# Patient Record
Sex: Female | Born: 1960 | Race: White | Hispanic: No | Marital: Married | Smoking: Former smoker
Health system: Southern US, Community
[De-identification: ages and names within clinical notes are randomized; demographics above are authoritative.]

## PROBLEM LIST (undated history)

## (undated) DIAGNOSIS — D751 Secondary polycythemia: Secondary | ICD-10-CM

## (undated) DIAGNOSIS — I10 Essential (primary) hypertension: Secondary | ICD-10-CM

## (undated) DIAGNOSIS — E669 Obesity, unspecified: Secondary | ICD-10-CM

## (undated) DIAGNOSIS — M169 Osteoarthritis of hip, unspecified: Secondary | ICD-10-CM

## (undated) DIAGNOSIS — E079 Disorder of thyroid, unspecified: Secondary | ICD-10-CM

## (undated) DIAGNOSIS — B019 Varicella without complication: Secondary | ICD-10-CM

## (undated) DIAGNOSIS — E785 Hyperlipidemia, unspecified: Secondary | ICD-10-CM

## (undated) DIAGNOSIS — F419 Anxiety disorder, unspecified: Secondary | ICD-10-CM

## (undated) DIAGNOSIS — T8859XA Other complications of anesthesia, initial encounter: Secondary | ICD-10-CM

## (undated) DIAGNOSIS — F32A Depression, unspecified: Secondary | ICD-10-CM

## (undated) DIAGNOSIS — Z87442 Personal history of urinary calculi: Secondary | ICD-10-CM

## (undated) DIAGNOSIS — E039 Hypothyroidism, unspecified: Secondary | ICD-10-CM

## (undated) DIAGNOSIS — S0185XA Open bite of other part of head, initial encounter: Secondary | ICD-10-CM

## (undated) DIAGNOSIS — W540XXA Bitten by dog, initial encounter: Secondary | ICD-10-CM

## (undated) DIAGNOSIS — E119 Type 2 diabetes mellitus without complications: Secondary | ICD-10-CM

## (undated) DIAGNOSIS — R748 Abnormal levels of other serum enzymes: Secondary | ICD-10-CM

## (undated) DIAGNOSIS — T4145XA Adverse effect of unspecified anesthetic, initial encounter: Secondary | ICD-10-CM

## (undated) DIAGNOSIS — K219 Gastro-esophageal reflux disease without esophagitis: Secondary | ICD-10-CM

## (undated) DIAGNOSIS — F209 Schizophrenia, unspecified: Secondary | ICD-10-CM

## (undated) DIAGNOSIS — K760 Fatty (change of) liver, not elsewhere classified: Secondary | ICD-10-CM

## (undated) DIAGNOSIS — G4730 Sleep apnea, unspecified: Secondary | ICD-10-CM

## (undated) HISTORY — DX: Abnormal levels of other serum enzymes: R74.8

## (undated) HISTORY — DX: Depression, unspecified: F32.A

## (undated) HISTORY — DX: Fatty (change of) liver, not elsewhere classified: K76.0

## (undated) HISTORY — DX: Sleep apnea, unspecified: G47.30

## (undated) HISTORY — DX: Schizophrenia, unspecified: F20.9

## (undated) HISTORY — DX: Disorder of thyroid, unspecified: E07.9

## (undated) HISTORY — DX: Varicella without complication: B01.9

## (undated) HISTORY — DX: Osteoarthritis of hip, unspecified: M16.9

## (undated) HISTORY — DX: Hyperlipidemia, unspecified: E78.5

## (undated) HISTORY — DX: Essential (primary) hypertension: I10

## (undated) HISTORY — DX: Hypothyroidism, unspecified: E03.9

## (undated) HISTORY — DX: Gastro-esophageal reflux disease without esophagitis: K21.9

## (undated) HISTORY — DX: Type 2 diabetes mellitus without complications: E11.9

## (undated) HISTORY — PX: BREAST BIOPSY: SHX20

## (undated) HISTORY — DX: Anxiety disorder, unspecified: F41.9

## (undated) HISTORY — DX: Obesity, unspecified: E66.9

---

## 2011-12-14 DIAGNOSIS — E039 Hypothyroidism, unspecified: Secondary | ICD-10-CM | POA: Insufficient documentation

## 2015-03-11 DIAGNOSIS — I10 Essential (primary) hypertension: Secondary | ICD-10-CM | POA: Insufficient documentation

## 2015-03-12 DIAGNOSIS — R748 Abnormal levels of other serum enzymes: Secondary | ICD-10-CM | POA: Insufficient documentation

## 2017-11-06 ENCOUNTER — Encounter: Payer: Self-pay | Admitting: *Deleted

## 2017-11-08 ENCOUNTER — Encounter: Payer: Self-pay | Admitting: Nurse Practitioner

## 2017-11-08 ENCOUNTER — Ambulatory Visit: Payer: Medicare PPO | Admitting: Nurse Practitioner

## 2017-11-08 VITALS — BP 136/82 | HR 84 | Temp 98.0°F | Ht 63.0 in | Wt 213.0 lb

## 2017-11-08 DIAGNOSIS — Z136 Encounter for screening for cardiovascular disorders: Secondary | ICD-10-CM | POA: Diagnosis not present

## 2017-11-08 DIAGNOSIS — F39 Unspecified mood [affective] disorder: Secondary | ICD-10-CM

## 2017-11-08 DIAGNOSIS — E669 Obesity, unspecified: Secondary | ICD-10-CM

## 2017-11-08 DIAGNOSIS — Z6837 Body mass index (BMI) 37.0-37.9, adult: Secondary | ICD-10-CM | POA: Diagnosis not present

## 2017-11-08 DIAGNOSIS — Z13220 Encounter for screening for lipoid disorders: Secondary | ICD-10-CM | POA: Diagnosis not present

## 2017-11-08 DIAGNOSIS — B354 Tinea corporis: Secondary | ICD-10-CM

## 2017-11-08 DIAGNOSIS — E782 Mixed hyperlipidemia: Secondary | ICD-10-CM | POA: Diagnosis not present

## 2017-11-08 DIAGNOSIS — E119 Type 2 diabetes mellitus without complications: Secondary | ICD-10-CM | POA: Diagnosis not present

## 2017-11-08 LAB — MICROALBUMIN / CREATININE URINE RATIO
Creatinine, Urine: 46 mg/dL (ref 20–275)
Microalb Creat Ratio: 13 mcg/mg creat (ref <30)
Microalb, Ur: 0.6 mg/dL

## 2017-11-08 NOTE — Patient Instructions (Addendum)
Please sign medical release to get records from previous pcp.  You will be contacted to schedule appt with psychiatry.  Labs indicates uncontrolled with hgb A1c of 14.7. Lipid panel also indicates very elevated triglyceride and LDL. Normal TSH and CMP. Increased metformin-glipizide to 2tabs twice a day (with food). Added atorvastatin and omega fatty acid. Also sent ketoconazole cream for rash. Need to f/up in 5weeks as discussed for CPE (fasting)

## 2017-11-08 NOTE — Progress Notes (Signed)
Subjective:  Patient ID: [name] [name], female    DOB: Apr 07, 1961  Age: 56 y.o. MRN: [id number]  CC: Establish Care (est care---DM medication refills. )   Rash  This is a new problem. The current episode started more than 1 month ago. The problem has been waxing and waning since onset. The affected locations include the chest. The rash is characterized by dryness, itchiness and redness. Associated with: her dog and cats. Pertinent negatives include no anorexia, congestion, cough, diarrhea, eye pain, facial edema, fatigue, fever, joint pain or nail changes. Past treatments include moisturizer (essential oils). The treatment provided mild relief.   Mood disorder: Hx of bipolar, schiphrenia, and PTSD Current psychiatrist: Dr. Gaye [name] in [place]. Last seen 08/2017, next appt 01/2017, does not need any medication refill at this time. She will like referral to local psychiatrist before she runs out. She endorses occasional suicidal ideation and auditory hallucination " this is not new. I have no intention to act on my thoughts. I am aware enough to tell my husband if anything changes. The voices just keep me company. I don't think I want them completely gone. With my current dose, they are manageable. I know to reach out if they change"  Denies any hx of suicide attempt. No ETOH or illicit substance use. Depression screen PHQ 2/9 11/08/2017  Decreased Interest 2  Down, Depressed, Hopeless 2  PHQ - 2 Score 4  Altered sleeping 3  Tired, decreased energy 3  Change in appetite 2  Feeling bad or failure about yourself  0  Trouble concentrating 3  Moving slowly or fidgety/restless 3  Suicidal thoughts 1  PHQ-9 Score 19   Last pcp: Dr. Greig [name] with [facility] Last CPE 11/2016.  DM: Controlled per patient Does not check glucose at home Reports last hgb A1c 6.9. Labs last done 11/2016. Needs eye exam, need referral to ophthalmology.  Mammogram last done 10/2016  (normal)  PAP smear last done 10/2016 (normal per patient)  Outpatient Medications Prior to Visit  Medication Sig Dispense Refill  . Amphetamine-Dextroamphetamine (AMPHETAMINE SALT COMBO PO) Take 5 mg by mouth 3 (three) times daily.    . QUEtiapine (SEROQUEL) 50 MG tablet Take 50 mg by mouth at bedtime.    . risperiDONE (RISPERDAL) 3 MG tablet Take 3 mg by mouth at bedtime.    Marland Kitchen glipiZIDE-metformin (METAGLIP) 2.5-500 MG tablet Take 1 tablet by mouth 2 (two) times daily before a meal.     No facility-administered medications prior to visit.    Social History   Socioeconomic History  . Marital status: Married    Spouse name: Not on file  . Number of children: Not on file  . Years of education: Not on file  . Highest education level: Not on file  Social Needs  . Financial resource strain: Not on file  . Food insecurity - worry: Not on file  . Food insecurity - inability: Not on file  . Transportation needs - medical: Not on file  . Transportation needs - non-medical: Not on file  Occupational History  . Not on file  Tobacco Use  . Smoking status: Former Games developer  . Smokeless tobacco: Never Used  Substance and Sexual Activity  . Alcohol use: No    Frequency: Never  . Drug use: No  . Sexual activity: Not on file  Other Topics Concern  . Not on file  Social History Narrative  . Not on file   Family History  Problem Relation Age of Onset  . Alcohol abuse Mother   . Arthritis Mother   . Diabetes Mother   . Hypertension Mother   . Hyperlipidemia Mother   . Alcohol abuse Father   . Arthritis Father   . Asthma Father   . Heart disease Father   . Hypertension Father   . Hyperlipidemia Father   . Alcohol abuse Brother   . Arthritis Brother   . Drug abuse Brother    ROS See HPI  Objective:  BP 136/82   Pulse 84   Temp 98 F (36.7 C)   Ht 5\' 3"  (1.6 m)   Wt 213 lb (96.6 kg)   SpO2 96%   BMI 37.73 kg/m   BP Readings from Last 3 Encounters:  11/08/17 136/82     Wt Readings from Last 3 Encounters:  11/08/17 213 lb (96.6 kg)    Physical Exam  Constitutional: She is oriented to person, place, and time. No distress.  Neck: Neck supple. No thyromegaly present.  Cardiovascular: Normal rate, regular rhythm, normal heart sounds and intact distal pulses.  Pulmonary/Chest: Effort normal and breath sounds normal.  Abdominal: Soft. Bowel sounds are normal. She exhibits no distension. There is no tenderness.  Musculoskeletal: Normal range of motion.  Neurological: She is alert and oriented to person, place, and time.  Skin: Skin is warm and dry. Rash noted. Rash is macular.     Psychiatric: She has a normal mood and affect. Her behavior is normal.  Vitals reviewed.   Lab Results  Component Value Date   GLUCOSE 362 (H) 11/11/2017   CHOL 249 (H) 11/11/2017   TRIG 253.0 (H) 11/11/2017   HDL 38.80 (L) 11/11/2017   LDLDIRECT 191.0 11/11/2017   ALT 23 11/11/2017   AST 20 11/11/2017   NA 133 (L) 11/11/2017   K 4.1 11/11/2017   CL 102 11/11/2017   CREATININE 0.61 11/11/2017   BUN 9 11/11/2017   CO2 20 11/11/2017   TSH 4.27 11/11/2017   HGBA1C 14.7 Repeated and verified X2. (H) 11/11/2017   MICROALBUR 0.6 11/08/2017     Assessment & Plan:   [name] [name] was seen today for establish care.  Diagnoses and all orders for this visit:  Mood disorder (HCC) -     Ambulatory referral to Psychiatry  Type 2 diabetes mellitus without complication, without long-term current use of insulin (HCC) -     Cancel: Hemoglobin A1c -     Microalbumin / creatinine urine ratio -     Ambulatory referral to Ophthalmology -     Comprehensive metabolic panel; Future -     HgB A1c; Future -     Lipid panel; Future -     TSH; Future -     glipiZIDE-metformin (METAGLIP) 2.5-500 MG tablet; Take 2 tablets by mouth 2 (two) times daily before a meal.  Encounter for lipid screening for cardiovascular disease -     Cancel: Lipid panel -     Comprehensive metabolic panel;  Future -     HgB A1c; Future -     Lipid panel; Future -     TSH; Future  Class 2 obesity with body mass index (BMI) of 37.0 to 37.9 in adult, unspecified obesity type, unspecified whether serious comorbidity present -     Cancel: TSH -     Cancel: Comprehensive metabolic panel -     Comprehensive metabolic panel; Future -     HgB A1c; Future -  Lipid panel; Future -     TSH; Future  Tinea corporis -     ketoconazole (NIZORAL) 2 % cream; Apply 1 application topically 2 (two) times daily.  Mixed hyperlipidemia -     atorvastatin (LIPITOR) 40 MG tablet; Take 1 tablet (40 mg total) by mouth daily. -     Icosapent Ethyl (VASCEPA) 1 g CAPS; Take 2 g by mouth 2 (two) times daily.   I have discontinued Byrd Hesselbach Stokes's glipiZIDE-metformin. I am also having her start on ketoconazole, glipiZIDE-metformin, atorvastatin, and Icosapent Ethyl. Additionally, I am having her maintain her risperiDONE, QUEtiapine, and Amphetamine-Dextroamphetamine (AMPHETAMINE SALT COMBO PO).  Meds ordered this encounter  Medications  . ketoconazole (NIZORAL) 2 % cream    Sig: Apply 1 application topically 2 (two) times daily.    Dispense:  60 g    Refill:  1    Order Specific Question:   Supervising Provider    Answer:   Dianne Dun [[id number]]  . glipiZIDE-metformin (METAGLIP) 2.5-500 MG tablet    Sig: Take 2 tablets by mouth 2 (two) times daily before a meal.    Dispense:  120 tablet    Refill:  5    Order Specific Question:   Supervising Provider    Answer:   Dianne Dun [[id number]]  . atorvastatin (LIPITOR) 40 MG tablet    Sig: Take 1 tablet (40 mg total) by mouth daily.    Dispense:  90 tablet    Refill:  1    Order Specific Question:   Supervising Provider    Answer:   Dianne Dun [[id number]]  . Icosapent Ethyl (VASCEPA) 1 g CAPS    Sig: Take 2 g by mouth 2 (two) times daily.    Dispense:  120 capsule    Refill:  5    Order Specific Question:   Supervising Provider    Answer:   Dianne Dun [[id number]]     Follow-up: Return in about 5 weeks (around 12/13/2017) for CPE (fasting).  Alysia Penna, NP

## 2017-11-11 ENCOUNTER — Encounter: Payer: Self-pay | Admitting: Nurse Practitioner

## 2017-11-11 ENCOUNTER — Other Ambulatory Visit (INDEPENDENT_AMBULATORY_CARE_PROVIDER_SITE_OTHER): Payer: Medicare PPO

## 2017-11-11 DIAGNOSIS — Z13220 Encounter for screening for lipoid disorders: Secondary | ICD-10-CM

## 2017-11-11 DIAGNOSIS — B354 Tinea corporis: Secondary | ICD-10-CM

## 2017-11-11 DIAGNOSIS — E669 Obesity, unspecified: Secondary | ICD-10-CM | POA: Diagnosis not present

## 2017-11-11 DIAGNOSIS — Z136 Encounter for screening for cardiovascular disorders: Secondary | ICD-10-CM | POA: Diagnosis not present

## 2017-11-11 DIAGNOSIS — R739 Hyperglycemia, unspecified: Secondary | ICD-10-CM | POA: Diagnosis not present

## 2017-11-11 DIAGNOSIS — E66812 Obesity, class 2: Secondary | ICD-10-CM | POA: Insufficient documentation

## 2017-11-11 DIAGNOSIS — F39 Unspecified mood [affective] disorder: Secondary | ICD-10-CM | POA: Insufficient documentation

## 2017-11-11 DIAGNOSIS — Z6837 Body mass index (BMI) 37.0-37.9, adult: Secondary | ICD-10-CM

## 2017-11-11 DIAGNOSIS — E782 Mixed hyperlipidemia: Secondary | ICD-10-CM | POA: Insufficient documentation

## 2017-11-11 DIAGNOSIS — E119 Type 2 diabetes mellitus without complications: Secondary | ICD-10-CM | POA: Insufficient documentation

## 2017-11-11 LAB — COMPREHENSIVE METABOLIC PANEL
ALT: 23 U/L (ref 0–35)
AST: 20 U/L (ref 0–37)
Albumin: 4 g/dL (ref 3.5–5.2)
Alkaline Phosphatase: 105 U/L (ref 39–117)
BUN: 9 mg/dL (ref 6–23)
CO2: 20 mEq/L (ref 19–32)
Calcium: 9.1 mg/dL (ref 8.4–10.5)
Chloride: 102 mEq/L (ref 96–112)
Creatinine, Ser: 0.61 mg/dL (ref 0.40–1.20)
GFR: 107.9 mL/min (ref >60.00)
Glucose, Bld: 362 mg/dL — ABNORMAL HIGH (ref 70–99)
Potassium: 4.1 mEq/L (ref 3.5–5.1)
Sodium: 133 mEq/L — ABNORMAL LOW (ref 135–145)
Total Bilirubin: 0.5 mg/dL (ref 0.2–1.2)
Total Protein: 7 g/dL (ref 6.0–8.3)

## 2017-11-11 LAB — TSH: TSH: 4.27 u[IU]/mL (ref 0.35–4.50)

## 2017-11-11 LAB — LIPID PANEL
Cholesterol: 249 mg/dL — ABNORMAL HIGH (ref 0–200)
HDL: 38.8 mg/dL — ABNORMAL LOW (ref >39.00)
NonHDL: 210.48
Total CHOL/HDL Ratio: 6
Triglycerides: 253 mg/dL — ABNORMAL HIGH (ref 0.0–149.0)
VLDL: 50.6 mg/dL — ABNORMAL HIGH (ref 0.0–40.0)

## 2017-11-11 LAB — LDL CHOLESTEROL, DIRECT: Direct LDL: 191 mg/dL

## 2017-11-11 LAB — HEMOGLOBIN A1C: Hgb A1c MFr Bld: 14.7 % — ABNORMAL HIGH (ref 4.6–6.5)

## 2017-11-11 MED ORDER — ICOSAPENT ETHYL 1 G PO CAPS: 2.0000 g | ORAL_CAPSULE | Freq: Two times a day (BID) | ORAL | 5 refills | Status: DC

## 2017-11-11 MED ORDER — ATORVASTATIN CALCIUM 40 MG PO TABS: 40.0000 mg | ORAL_TABLET | Freq: Every day | ORAL | 1 refills | Status: DC

## 2017-11-11 MED ORDER — KETOCONAZOLE 2 % EX CREA: 1.0000 "application " | TOPICAL_CREAM | Freq: Two times a day (BID) | CUTANEOUS | 1 refills | Status: DC

## 2017-11-11 MED ORDER — GLIPIZIDE-METFORMIN HCL 2.5-500 MG PO TABS: 2.0000 | ORAL_TABLET | Freq: Two times a day (BID) | ORAL | 5 refills | Status: DC

## 2017-11-11 NOTE — Assessment & Plan Note (Signed)
Start atorvastatin and omega fatty acid. Lipid Panel     Component Value Date/Time   CHOL 249 (H) 11/11/2017 0802   TRIG 253.0 (H) 11/11/2017 0802   HDL 38.80 (L) 11/11/2017 0802   CHOLHDL 6 11/11/2017 0802   VLDL 50.6 (H) 11/11/2017 0802   LDLDIRECT 191.0 11/11/2017 0802

## 2017-11-11 NOTE — Assessment & Plan Note (Addendum)
Uncontrolled with Hgb A1c of 14.7. Increased metformin-glipizide 2.5/500mg  to 2tabs BID. Normal urine microalbumin

## 2017-11-19 MED ORDER — FLUCONAZOLE 150 MG PO TABS: 150.0000 mg | ORAL_TABLET | Freq: Every day | ORAL | 0 refills | Status: DC

## 2017-11-22 ENCOUNTER — Other Ambulatory Visit: Payer: Self-pay | Admitting: Nurse Practitioner

## 2017-11-22 DIAGNOSIS — E119 Type 2 diabetes mellitus without complications: Secondary | ICD-10-CM

## 2017-11-22 MED ORDER — GLIPIZIDE-METFORMIN HCL 2.5-500 MG PO TABS: 2.0000 | ORAL_TABLET | Freq: Two times a day (BID) | ORAL | 0 refills | Status: DC

## 2017-11-22 NOTE — Telephone Encounter (Signed)
Left a vm for the pt to call back.   [facility]key to [facility]?

## 2017-11-22 NOTE — Telephone Encounter (Signed)
Pt is taking 2 tab twice daily. 30 pill sent in to local pharmacy for pt.

## 2017-11-22 NOTE — Addendum Note (Signed)
Addended byShawnie Pons on: 11/22/2017 02:06 PM   Modules accepted: Orders

## 2017-11-22 NOTE — Telephone Encounter (Signed)
Copied from [place] [id number]. Topic: Quick Communication - Rx Refill/Question >> Nov 22, 2017  9:09 AM Marin Olp L wrote: Medication: glipiZIDE-metformin (METAGLIP) 2.5-500 MG tablet Has the patient contacted their pharmacy? No. (uses mail order but its delayed) (Agent: If no, request that the patient contact the pharmacy for the refill.) Preferred Pharmacy (with phone number or street name): [id number] [place], [place] - [address] Agent: Please be advised that RX refills may take up to 3 business days. We ask that you follow-up with your pharmacy. Mail order script is delayed and would like a 7 days supply.

## 2017-11-22 NOTE — Telephone Encounter (Signed)
Ok to send 14tabs to local pharmacy

## 2017-11-22 NOTE — Telephone Encounter (Signed)
LR: 11/11/17 #120 5 refills  OV: 11/08/17  Pt requesting 7 day supply Routing to provider

## 2017-11-29 ENCOUNTER — Ambulatory Visit: Payer: Self-pay | Admitting: *Deleted

## 2017-11-29 NOTE — Telephone Encounter (Signed)
Thank you, FYI

## 2017-11-29 NOTE — Telephone Encounter (Signed)
Go to hospital for further evaluation. Need to rule out acute CVA.

## 2017-11-29 NOTE — Telephone Encounter (Signed)
Left vm for the pt to call back as soon as she can, will try again. Need to inform her to go to ER for CVA evaluation due to her symptoms. Need to make sure that her sugar is not dropping as well--vision change can be coming from there. CRM

## 2017-11-29 NOTE — Telephone Encounter (Signed)
Please advise 

## 2017-11-29 NOTE — Telephone Encounter (Signed)
Called in c/o vision changes since starting Metaglip (Glipizide-metformin) about 2 weeks ago.  See triage note.  I have routed a high priority note to [employer] nurse pool making them aware of her situation. Reason for Disposition . Caller has URGENT medication question about med that PCP prescribed and triager unable to answer question  Answer Assessment - Initial Assessment Questions 1. SYMPTOMS: "Do you have any symptoms?"     My vision has become blurry since starting the Metformin/glipizide.  It's blurry close up and far away and it's worse since starting the medication. 2. SEVERITY: If symptoms are present, ask "Are they mild, moderate or severe?"     It's gotten pretty bad.   I usually only use readers for up close reading but even with those I'm having trouble focusing close up and far away.  Protocols used: MEDICATION QUESTION CALL-A-AH

## 2017-11-29 NOTE — Telephone Encounter (Signed)
Patient returned call, informed her per note Wilfred Lacy 11/29/17 is for her to go the emergency department for further evaluation, need to rule out CVA, she asked should she be having any other symptoms besides vision changes, she was advised vision changes could very well be a stroke, stroke symptoms are not just loss of movement in extremities, slurred speech. It can be subtle symptoms such as vision change. She said "ok, but I want her to know that my vision started changing when I started on the Metaglip."  I told her she would be made aware. I asked is she going to be checked out at the ED, she said "yes, but let Baldo Ash know."

## 2017-12-02 ENCOUNTER — Telehealth: Payer: Self-pay | Admitting: Nurse Practitioner

## 2017-12-02 NOTE — Telephone Encounter (Signed)
Last office visit note faxed to Dr. Zadie Rhine as request (we referral pt out of this doctor).      Copied from [place] #[id number]. Topic: Inquiry >> Dec 02, 2017  1:21 PM [name] [name] B wrote: Reason for CRM: [facility] called to get a Dr's note faxed to them, they are needing the pt's last visit information faxed to them to [phone], the physician requesting is Dr. Zadie Rhine

## 2017-12-12 ENCOUNTER — Encounter: Payer: Self-pay | Admitting: Nurse Practitioner

## 2017-12-13 ENCOUNTER — Ambulatory Visit (HOSPITAL_COMMUNITY): Payer: Medicare PPO | Admitting: Certified Registered"

## 2017-12-13 ENCOUNTER — Encounter (HOSPITAL_COMMUNITY): Payer: Self-pay | Admitting: Certified Registered"

## 2017-12-13 ENCOUNTER — Encounter (HOSPITAL_COMMUNITY)
Admission: EM | Disposition: A | Discharge status: Home / Self Care | Payer: Self-pay | Source: Home / Self Care | Attending: Emergency Medicine

## 2017-12-13 ENCOUNTER — Encounter: Payer: Medicare PPO | Admitting: Nurse Practitioner

## 2017-12-13 ENCOUNTER — Inpatient Hospital Stay: Admit: 2017-12-13 | Payer: Medicare PPO | Admitting: Otolaryngology

## 2017-12-13 ENCOUNTER — Ambulatory Visit (HOSPITAL_COMMUNITY)
Admission: EM | Admit: 2017-12-13 | Discharge: 2017-12-13 | Disposition: A | Discharge status: Home / Self Care | Payer: Medicare PPO | Attending: Emergency Medicine | Admitting: Emergency Medicine

## 2017-12-13 ENCOUNTER — Other Ambulatory Visit: Payer: Self-pay

## 2017-12-13 DIAGNOSIS — E039 Hypothyroidism, unspecified: Secondary | ICD-10-CM | POA: Diagnosis not present

## 2017-12-13 DIAGNOSIS — Y929 Unspecified place or not applicable: Secondary | ICD-10-CM | POA: Diagnosis not present

## 2017-12-13 DIAGNOSIS — E119 Type 2 diabetes mellitus without complications: Secondary | ICD-10-CM | POA: Insufficient documentation

## 2017-12-13 DIAGNOSIS — Z79899 Other long term (current) drug therapy: Secondary | ICD-10-CM | POA: Diagnosis not present

## 2017-12-13 DIAGNOSIS — S01352A Open bite of left ear, initial encounter: Secondary | ICD-10-CM | POA: Insufficient documentation

## 2017-12-13 DIAGNOSIS — S01152A Open bite of left eyelid and periocular area, initial encounter: Secondary | ICD-10-CM | POA: Diagnosis not present

## 2017-12-13 DIAGNOSIS — S0125XA Open bite of nose, initial encounter: Secondary | ICD-10-CM | POA: Insufficient documentation

## 2017-12-13 DIAGNOSIS — Z87891 Personal history of nicotine dependence: Secondary | ICD-10-CM | POA: Insufficient documentation

## 2017-12-13 DIAGNOSIS — F209 Schizophrenia, unspecified: Secondary | ICD-10-CM | POA: Insufficient documentation

## 2017-12-13 DIAGNOSIS — S0185XA Open bite of other part of head, initial encounter: Secondary | ICD-10-CM

## 2017-12-13 DIAGNOSIS — I10 Essential (primary) hypertension: Secondary | ICD-10-CM | POA: Insufficient documentation

## 2017-12-13 DIAGNOSIS — W540XXA Bitten by dog, initial encounter: Secondary | ICD-10-CM | POA: Diagnosis not present

## 2017-12-13 HISTORY — PX: LACERATION REPAIR: SHX5284

## 2017-12-13 LAB — CBC
HCT: 44.1 % (ref 36.0–46.0)
Hemoglobin: 14.7 g/dL (ref 12.0–15.0)
MCH: 29.6 pg (ref 26.0–34.0)
MCHC: 33.3 g/dL (ref 30.0–36.0)
MCV: 88.9 fL (ref 78.0–100.0)
Platelets: 258 10*3/uL (ref 150–400)
RBC: 4.96 MIL/uL (ref 3.87–5.11)
RDW: 13.4 % (ref 11.5–15.5)
WBC: 10.4 10*3/uL (ref 4.0–10.5)

## 2017-12-13 LAB — BASIC METABOLIC PANEL
Anion gap: 9 (ref 5–15)
BUN: 11 mg/dL (ref 6–20)
CO2: 21 mmol/L — ABNORMAL LOW (ref 22–32)
Calcium: 9.3 mg/dL (ref 8.9–10.3)
Chloride: 108 mmol/L (ref 101–111)
Creatinine, Ser: 0.64 mg/dL (ref 0.44–1.00)
GFR calc Af Amer: >60 mL/min (ref >60)
GFR calc non Af Amer: >60 mL/min (ref >60)
Glucose, Bld: 109 mg/dL — ABNORMAL HIGH (ref 65–99)
Potassium: 5.6 mmol/L — ABNORMAL HIGH (ref 3.5–5.1)
Sodium: 138 mmol/L (ref 135–145)

## 2017-12-13 LAB — GLUCOSE, CAPILLARY
Glucose-Capillary: 91 mg/dL (ref 65–99)
Glucose-Capillary: 137 mg/dL — ABNORMAL HIGH (ref 65–99)
Glucose-Capillary: 242 mg/dL — ABNORMAL HIGH (ref 65–99)

## 2017-12-13 LAB — CBG MONITORING, ED: Glucose-Capillary: 157 mg/dL — ABNORMAL HIGH (ref 65–99)

## 2017-12-13 SURGERY — REPAIR, LACERATION, 2 OR MORE
Anesthesia: General

## 2017-12-13 SURGERY — REPAIR, LACERATION, 2 OR MORE
Anesthesia: General | Site: Nose

## 2017-12-13 MED ORDER — LIDOCAINE-EPINEPHRINE 1 %-1:100000 IJ SOLN
INTRAMUSCULAR | Status: DC | PRN
  Administered 2017-12-13: 3 mL

## 2017-12-13 MED ORDER — PHENYLEPHRINE HCL 10 MG/ML IJ SOLN
INTRAMUSCULAR | Status: DC | PRN
  Administered 2017-12-13 (×3): 80 ug via INTRAVENOUS

## 2017-12-13 MED ORDER — BACITRACIN ZINC 500 UNIT/GM EX OINT
TOPICAL_OINTMENT | CUTANEOUS | Status: AC
  Filled 2017-12-13: qty 28.35

## 2017-12-13 MED ORDER — SUCCINYLCHOLINE CHLORIDE 200 MG/10ML IV SOSY
PREFILLED_SYRINGE | INTRAVENOUS | Status: DC | PRN
  Administered 2017-12-13: 120 mg via INTRAVENOUS

## 2017-12-13 MED ORDER — PROPOFOL 10 MG/ML IV BOLUS
INTRAVENOUS | Status: AC
Start: 2017-12-13 — End: 2017-12-13
  Filled 2017-12-13: qty 20

## 2017-12-13 MED ORDER — ONDANSETRON HCL 4 MG/2ML IJ SOLN
INTRAMUSCULAR | Status: AC
  Filled 2017-12-13: qty 2

## 2017-12-13 MED ORDER — PROPOFOL 10 MG/ML IV BOLUS
INTRAVENOUS | Status: DC | PRN
  Administered 2017-12-13: 170 mg via INTRAVENOUS

## 2017-12-13 MED ORDER — MIDAZOLAM HCL 2 MG/2ML IJ SOLN: 0.5000 mg | Freq: Once | INTRAMUSCULAR | Status: DC

## 2017-12-13 MED ORDER — DEXAMETHASONE SODIUM PHOSPHATE 10 MG/ML IJ SOLN
INTRAMUSCULAR | Status: AC
  Filled 2017-12-13: qty 1

## 2017-12-13 MED ORDER — MIDAZOLAM HCL 2 MG/2ML IJ SOLN
INTRAMUSCULAR | Status: AC
  Filled 2017-12-13: qty 2

## 2017-12-13 MED ORDER — LACTATED RINGERS IV SOLN
INTRAVENOUS | Status: DC | PRN
  Administered 2017-12-13 (×2): via INTRAVENOUS

## 2017-12-13 MED ORDER — SODIUM CHLORIDE 0.9 % IV SOLN
3.0000 g | Freq: Once | INTRAVENOUS | Status: DC
  Filled 2017-12-13: qty 3

## 2017-12-13 MED ORDER — BACITRACIN ZINC 500 UNIT/GM EX OINT
TOPICAL_OINTMENT | CUTANEOUS | Status: DC | PRN
  Administered 2017-12-13: 1 via TOPICAL

## 2017-12-13 MED ORDER — MEPERIDINE HCL 50 MG/ML IJ SOLN: 6.2500 mg | INTRAMUSCULAR | Status: DC | PRN

## 2017-12-13 MED ORDER — HYDROMORPHONE HCL 1 MG/ML IJ SOLN
INTRAMUSCULAR | Status: AC
  Filled 2017-12-13: qty 1

## 2017-12-13 MED ORDER — FENTANYL CITRATE (PF) 100 MCG/2ML IJ SOLN
INTRAMUSCULAR | Status: AC
  Filled 2017-12-13: qty 2

## 2017-12-13 MED ORDER — HYDROCODONE-ACETAMINOPHEN 5-325 MG PO TABS: 1.0000 | ORAL_TABLET | Freq: Four times a day (QID) | ORAL | 0 refills | Status: DC | PRN

## 2017-12-13 MED ORDER — FENTANYL CITRATE (PF) 250 MCG/5ML IJ SOLN
INTRAMUSCULAR | Status: AC
  Filled 2017-12-13: qty 5

## 2017-12-13 MED ORDER — MIDAZOLAM HCL 5 MG/5ML IJ SOLN
INTRAMUSCULAR | Status: DC | PRN
  Administered 2017-12-13: 2 mg via INTRAVENOUS
  Administered 2017-12-13: 1 mg via INTRAVENOUS

## 2017-12-13 MED ORDER — LIDOCAINE 2% (20 MG/ML) 5 ML SYRINGE
INTRAMUSCULAR | Status: DC | PRN
  Administered 2017-12-13: 75 mg via INTRAVENOUS

## 2017-12-13 MED ORDER — EPHEDRINE 5 MG/ML INJ
INTRAVENOUS | Status: AC
  Filled 2017-12-13: qty 10

## 2017-12-13 MED ORDER — AMOXICILLIN-POT CLAVULANATE 875-125 MG PO TABS: 1.0000 | ORAL_TABLET | Freq: Two times a day (BID) | ORAL | 0 refills | Status: AC

## 2017-12-13 MED ORDER — HYDROMORPHONE HCL 1 MG/ML IJ SOLN
0.2500 mg | INTRAMUSCULAR | Status: DC | PRN
  Administered 2017-12-13: 0.5 mg via INTRAVENOUS

## 2017-12-13 MED ORDER — ONDANSETRON 8 MG PO TBDP
8.0000 mg | ORAL_TABLET | Freq: Once | ORAL | Status: AC
  Administered 2017-12-13: 8 mg via ORAL
  Filled 2017-12-13: qty 1

## 2017-12-13 MED ORDER — ONDANSETRON HCL 4 MG/2ML IJ SOLN
INTRAMUSCULAR | Status: DC | PRN
  Administered 2017-12-13: 4 mg via INTRAVENOUS

## 2017-12-13 MED ORDER — DEXAMETHASONE SODIUM PHOSPHATE 10 MG/ML IJ SOLN
INTRAMUSCULAR | Status: DC | PRN
  Administered 2017-12-13: 10 mg via INTRAVENOUS

## 2017-12-13 MED ORDER — PHENYLEPHRINE 40 MCG/ML (10ML) SYRINGE FOR IV PUSH (FOR BLOOD PRESSURE SUPPORT)
PREFILLED_SYRINGE | INTRAVENOUS | Status: AC
  Filled 2017-12-13: qty 10

## 2017-12-13 MED ORDER — LIDOCAINE 2% (20 MG/ML) 5 ML SYRINGE
INTRAMUSCULAR | Status: AC
  Filled 2017-12-13: qty 5

## 2017-12-13 MED ORDER — ROCURONIUM BROMIDE 10 MG/ML (PF) SYRINGE
PREFILLED_SYRINGE | INTRAVENOUS | Status: DC | PRN
  Administered 2017-12-13: 20 mg via INTRAVENOUS

## 2017-12-13 MED ORDER — HYDROMORPHONE HCL 1 MG/ML IJ SOLN
INTRAMUSCULAR | Status: AC
  Filled 2017-12-13: qty 2

## 2017-12-13 MED ORDER — SUCCINYLCHOLINE CHLORIDE 200 MG/10ML IV SOSY
PREFILLED_SYRINGE | INTRAVENOUS | Status: AC
  Filled 2017-12-13: qty 10

## 2017-12-13 MED ORDER — ARTIFICIAL TEARS OPHTHALMIC OINT
TOPICAL_OINTMENT | OPHTHALMIC | Status: AC
  Filled 2017-12-13: qty 3.5

## 2017-12-13 MED ORDER — SODIUM CHLORIDE 0.9 % IV SOLN
3.0000 g | Freq: Once | INTRAVENOUS | Status: AC
  Administered 2017-12-13: 3 g via INTRAVENOUS

## 2017-12-13 MED ORDER — FENTANYL CITRATE (PF) 250 MCG/5ML IJ SOLN
INTRAMUSCULAR | Status: DC | PRN
  Administered 2017-12-13 (×4): 50 ug via INTRAVENOUS
  Administered 2017-12-13: 100 ug via INTRAVENOUS
  Administered 2017-12-13: 50 ug via INTRAVENOUS

## 2017-12-13 MED ORDER — LIDOCAINE-EPINEPHRINE (PF) 1 %-1:200000 IJ SOLN
INTRAMUSCULAR | Status: AC
  Filled 2017-12-13: qty 30

## 2017-12-13 MED ORDER — 0.9 % SODIUM CHLORIDE (POUR BTL) OPTIME
TOPICAL | Status: DC | PRN
  Administered 2017-12-13: 1000 mL

## 2017-12-13 MED ORDER — METHOCARBAMOL 1000 MG/10ML IJ SOLN
500.0000 mg | Freq: Once | INTRAVENOUS | Status: AC
  Administered 2017-12-13: 500 mg via INTRAVENOUS
  Filled 2017-12-13: qty 550

## 2017-12-13 MED ORDER — PROMETHAZINE HCL 25 MG/ML IJ SOLN: 6.2500 mg | INTRAMUSCULAR | Status: DC | PRN

## 2017-12-13 MED ORDER — ROCURONIUM BROMIDE 10 MG/ML (PF) SYRINGE
PREFILLED_SYRINGE | INTRAVENOUS | Status: AC
  Filled 2017-12-13: qty 5

## 2017-12-13 MED ORDER — EPHEDRINE SULFATE-NACL 50-0.9 MG/10ML-% IV SOSY
PREFILLED_SYRINGE | INTRAVENOUS | Status: DC | PRN
  Administered 2017-12-13: 10 mg via INTRAVENOUS

## 2017-12-13 SURGICAL SUPPLY — 34 items
BAG ZIPLOCK 12X15 (MISCELLANEOUS) IMPLANT
CLEANER TIP ELECTROSURG 2X2 (MISCELLANEOUS) ×2 IMPLANT
COVER SURGICAL LIGHT HANDLE (MISCELLANEOUS) ×2 IMPLANT
DRAIN PENROSE 18X1/4 LTX STRL (WOUND CARE) IMPLANT
DRAPE SHEET LG 3/4 BI-LAMINATE (DRAPES) ×2 IMPLANT
DRSG EMULSION OIL 3X3 NADH (GAUZE/BANDAGES/DRESSINGS) ×2 IMPLANT
DRSG MEPILEX BORDER 4X4 (GAUZE/BANDAGES/DRESSINGS) ×2 IMPLANT
ELECT COATED BLADE 2.86 ST (ELECTRODE) ×2 IMPLANT
ELECT NEEDLE TIP 2.8 STRL (NEEDLE) ×2 IMPLANT
ELECT REM PT RETURN 15FT ADLT (MISCELLANEOUS) ×2 IMPLANT
GAUZE SPONGE 2X2 8PLY STRL LF (GAUZE/BANDAGES/DRESSINGS) ×1 IMPLANT
GAUZE SPONGE 4X4 12PLY STRL (GAUZE/BANDAGES/DRESSINGS) IMPLANT
GAUZE SPONGE 4X4 16PLY XRAY LF (GAUZE/BANDAGES/DRESSINGS) ×4 IMPLANT
GAUZE XEROFORM 2X2 STRL (GAUZE/BANDAGES/DRESSINGS) ×2 IMPLANT
KIT BASIN OR (CUSTOM PROCEDURE TRAY) ×2 IMPLANT
MARKER SKIN DUAL TIP RULER LAB (MISCELLANEOUS) ×2 IMPLANT
MATRIX SURGICAL PSM 7X10CM (Tissue) ×2 IMPLANT
MICROMATRIX 500MG (Tissue) ×2 IMPLANT
NS IRRIG 1000ML POUR BTL (IV SOLUTION) IMPLANT
PACK GENERAL/GYN (CUSTOM PROCEDURE TRAY) ×2 IMPLANT
PENCIL FOOT CONTROL (ELECTRODE) IMPLANT
SOLUTION PARTIC MCRMTRX 500MG (Tissue) ×1 IMPLANT
SPONGE GAUZE 2X2 STER 10/PKG (GAUZE/BANDAGES/DRESSINGS) ×1
STRIP CLOSURE SKIN 1/2X4 (GAUZE/BANDAGES/DRESSINGS) IMPLANT
SUT CHROMIC 4 0 P 3 18 (SUTURE) ×2 IMPLANT
SUT CHROMIC 5 0 P 3 (SUTURE) ×6 IMPLANT
SUT ETHILON 4 0 (SUTURE) ×2 IMPLANT
SUT ETHILON 5 0 P 3 18 (SUTURE) ×1
SUT ETHILON 5 0 PS 2 18 (SUTURE) ×2 IMPLANT
SUT NYLON ETHILON 5-0 P-3 1X18 (SUTURE) ×1 IMPLANT
SWAB COLLECTION DEVICE MRSA (MISCELLANEOUS) IMPLANT
SWAB CULTURE ESWAB REG 1ML (MISCELLANEOUS) IMPLANT
SYR BULB IRRIGATION 50ML (SYRINGE) IMPLANT
WATER STERILE IRR 1000ML POUR (IV SOLUTION) IMPLANT

## 2017-12-13 NOTE — Anesthesia Postprocedure Evaluation (Signed)
Anesthesia Post Note  Patient: [name] [name]  Procedure(s) Performed: Closure of nasal lacerations with one rotator graft and one advanced flap with skin graft, closure of left nasal laceration, closure of left ear. (N/A Nose)     Patient location during evaluation: PACU Anesthesia Type: General Level of consciousness: awake and alert Pain management: pain level controlled Vital Signs Assessment: post-procedure vital signs reviewed and stable Respiratory status: spontaneous breathing, nonlabored ventilation, respiratory function stable and patient connected to nasal cannula oxygen Cardiovascular status: blood pressure returned to baseline and stable Postop Assessment: no apparent nausea or vomiting Anesthetic complications: no    Last Vitals:  Vitals:   12/13/17 1645 12/13/17 1700  BP: 138/81 (!) 147/83  Pulse: 96 87  Resp: 12 15  Temp: 36.6 C 37 C  SpO2: 96% 95%    Last Pain:  Vitals:   12/13/17 1700  TempSrc:   PainSc: 2                  Odalys Win

## 2017-12-13 NOTE — Discharge Instructions (Addendum)
Animal Bite Animal bite wounds can get infected. It is important to get proper medical treatment. Ask your doctor if you need rabies treatment. Follow these instructions at home: Wound care  Follow instructions from your doctor about how to take care of your wound. Make sure you: ? Wash your hands with soap and water before you change your bandage (dressing). If you cannot use soap and water, use hand sanitizer. ? Change your bandage as told by your doctor. ? Leave stitches (sutures), skin glue, or skin tape (adhesive) strips in place. They may need to stay in place for 2 weeks or longer. If tape strips get loose and curl up, you may trim the loose edges. Do not remove tape strips completely unless your doctor says it is okay.  Check your wound every day for signs of infection. Watch for: ? Redness, swelling, or pain that gets worse. ? Fluid, blood, or pus. General instructions  Take or apply over-the-counter and prescription medicines only as told by your doctor.  If you were prescribed an antibiotic, take or apply it as told by your doctor. Do not stop using the antibiotic even if your condition improves.  Keep the injured area raised (elevated) above the level of your heart while you are sitting or lying down.  If directed, apply ice to the injured area. ? Put ice in a plastic bag. ? Place a towel between your skin and the bag. ? Leave the ice on for 20 minutes, 2-3 times per day.  Keep all follow-up visits as told by your doctor. This is important. Contact a doctor if:  You have redness, swelling, or pain that gets worse.  You have a general feeling of sickness (malaise).  You feel sick to your stomach (nauseous).  You throw up (vomit).  You have pain that does not get better. Get help right away if:  You have a red streak going away from your wound.  You have fluid, blood, or pus coming from your wound.  You have a fever or chills.  You have trouble moving your  injured area.  You have numbness or tingling anywhere on your body. This information is not intended to replace advice given to you by your health care provider. Make sure you discuss any questions you have with your health care provider. Document Released: 10/29/2005 Document Revised: 04/05/2016 Document Reviewed: 03/16/2015 Elsevier Interactive Patient Education  2018 [place] Anesthesia, Adult, Care After These instructions provide you with information about caring for yourself after your procedure. Your health care provider may also give you more specific instructions. Your treatment has been planned according to current medical practices, but problems sometimes occur. Call your health care provider if you have any problems or questions after your procedure. What can I expect after the procedure? After the procedure, it is common to have:  Vomiting.  A sore throat.  Mental slowness.  It is common to feel:  Nauseous.  Cold or shivery.  Sleepy.  Tired.  Sore or achy, even in parts of your body where you did not have surgery.  Follow these instructions at home: For at least 24 hours after the procedure:  Do not: ? Participate in activities where you could fall or become injured. ? Drive. ? Use heavy machinery. ? Drink alcohol. ? Take sleeping pills or medicines that cause drowsiness. ? Make important decisions or sign legal documents. ? Take care of children on your own.  Rest. Eating and drinking  If you vomit, drink water, juice, or soup when you can drink without vomiting.  Drink enough fluid to keep your urine clear or pale yellow.  Make sure you have little or no nausea before eating solid foods.  Follow the diet recommended by your health care provider. General instructions  Have a responsible adult stay with you until you are awake and alert.  Return to your normal activities as told by your health care provider. Ask your health care  provider what activities are safe for you.  Take over-the-counter and prescription medicines only as told by your health care provider.  If you smoke, do not smoke without supervision.  Keep all follow-up visits as told by your health care provider. This is important. Contact a health care provider if:  You continue to have nausea or vomiting at home, and medicines are not helpful.  You cannot drink fluids or start eating again.  You cannot urinate after 8-12 hours.  You develop a skin rash.  You have fever.  You have increasing redness at the site of your procedure. Get help right away if:  You have difficulty breathing.  You have chest pain.  You have unexpected bleeding.  You feel that you are having a life-threatening or urgent problem. This information is not intended to replace advice given to you by your health care provider. Make sure you discuss any questions you have with your health care provider. Document Released: 02/04/2001 Document Revised: 04/02/2016 Document Reviewed: 10/13/2015 Elsevier Interactive Patient Education  [employer].

## 2017-12-13 NOTE — Transfer of Care (Signed)
Immediate Anesthesia Transfer of Care Note  Patient: [name] [name]  Procedure(s) Performed: Closure of nasal lacerations with one rotator graft and one advanced flap with skin graft, closure of left nasal laceration, closure of left ear. (N/A Nose)  Patient Location: PACU  Anesthesia Type:General  Level of Consciousness: awake, alert  and oriented  Airway & Oxygen Therapy: Patient Spontanous Breathing and Patient connected to face mask oxygen  Post-op Assessment: Report given to RN and Post -op Vital signs reviewed and stable  Post vital signs: Reviewed and stable  Last Vitals:  Vitals:   12/13/17 1140 12/13/17 1554  BP: (!) 175/86 (!) 158/88  Pulse: (!) 101   Resp: 12 19  Temp:    SpO2: 96%     Last Pain:  Vitals:   12/13/17 1200  TempSrc:   PainSc: 0-No pain         Complications: No apparent anesthesia complications

## 2017-12-13 NOTE — ED Triage Notes (Signed)
Animal control was on site; they are aware and EMS believes they are coming to the ED.

## 2017-12-13 NOTE — Op Note (Signed)
Preop/postop diagnosis: Dog bites to the face with 3 cm complex nasal laceration with loss of tissue, left superior ear with loss of tissue, 2 cm laceration multiple of the left medial canthal region.  Procedure: Closure of right nasal laceration with rotation flap superiorly and inferior advancement flap with a cell skin graft, closure of left 2 cm lacerations, closure of superior left ear 2 cm laceration  Anesthesia: Gen. Estimated blood loss: Less than 10 mL Indications: 57 year old who was attacked by 3. Boles earlier today. She sustained injury to the right nose with a large piece of missing tissue and also a missing portion of her helix on the left ear. Also multiple other lacerations and puncture wounds. She was informed risk and benefits of the procedure including need for further surgery. Options were discussed. All her questions are answered and consent was obtained. Procedure: Patient was taken operating room placed in supine position after general endotracheal tube anesthesia was prepped and draped in the nasal area. The wound was examined and there was a large piece of tissue that was approximately 2 cm x 2.5 cm missing off the right nose superior to the alar. There was stellate lacerations in multiple directions of the wound. This required flaps to be elevated superiorly to allow a rotation flap down inferiorly. The wound incision line was extended superiorly into the glabella region. The flap was elevated into the glabellar  and frontal area. It was rotated inferiorly. This allowed some movement of the tissue to cover about 60% of the wound. The inferior aspect was then covered by making another incision along the nasolabial fold and extending one edge of the wound and elevate the flap and advancing it superiorly. All of these areas were closed with interrupted 4-0 chromic and running or interrupted 5-0 nylon. There was still a defect remaining that could not be closed without deforming the  alar. This was covered with A cell. The powder was placed over the wound and then a graft was fashioned and placed over the area. It was secured with 4-0 chromic. The multiple 2 cm lacerations along the left medial canthal region were closed with 4-0 chromic and interrupted 5-0 nylon. The wound was dressed with Adaptic and then K-Y jelly. A dressing was then placed over the bridge of the nose to apply pressure to the skin graft. She was then taken to the left ear where it was prepped and draped with Betadine. The wound was trimmed of some of the cartilage stick out into the wound. The skin was elevated off the cartilage and then the 2 edges were closed with interrupted 5-0 nylon. There was a puncture wound just superior to the ear which was irrigated but left open. The patient was then awakened brought to recovery in stable condition counts correct. The nasal cavity had no evidence of injury the septum looked normal and the turbinates and inside lining was without evidence of injury.

## 2017-12-13 NOTE — ED Provider Notes (Signed)
[facility] DEPT Provider Note   CSN: [id number] Arrival date & time: 12/13/17  1008     History   Chief Complaint Chief Complaint  Patient presents with  . Animal Bite    HPI [name] [name] is a 57 y.o. female.  HPI 57 yo female who presents to the emergency department after developing injuries to the left ear and right nose when she was attacked by multiple pit bulls while trying to protect her dog on the ground.  These dogs were her neighbors.  She presents with tissue loss of the left ear and tissue loss of the right nose near the nasal facial angle.  No active bleeding at this time.  No significant headache.  Denies neck pain or other injuries.   Past Medical History:  Diagnosis Date  . Chickenpox   . Diabetes mellitus without complication ([place])   . Elevated liver enzymes    ABD US done 08/2016: fatty liver  . Fatty liver    ABD US done 08/2016: fatty liver  . Hyperlipidemia   . Hypertension   . Hypothyroidism   . OA (osteoarthritis) of hip    right  . Schizophrenia ([place])   . Thyroid disease     Patient Active Problem List   Diagnosis Date Noted  . Mood disorder ([place]) 11/11/2017  . Type 2 diabetes mellitus without complication, without long-term current use of insulin ([place]) 11/11/2017  . Class 2 obesity with body mass index (BMI) of 37.0 to 37.9 in adult 11/11/2017  . Mixed hyperlipidemia 11/11/2017  . Tinea corporis 11/08/2017    No past surgical history on file.  OB History    No data available       Home Medications    Prior to Admission medications   Medication Sig Start Date End Date Taking? Authorizing Provider  Amphetamine-Dextroamphetamine (AMPHETAMINE SALT COMBO PO) Take 5 mg by mouth 3 (three) times daily.    [provider]  atorvastatin (LIPITOR) 40 MG tablet Take 1 tablet (40 mg total) by mouth daily. 11/11/17   Nche, Charlene Brooke, NP  fluconazole (DIFLUCAN) 150 MG tablet Take 1 tablet (150 mg total)  by mouth daily. Take second tab 3days apart from first tab 11/19/17   Nche, Charlene Brooke, NP  glipiZIDE-metformin (METAGLIP) 2.5-500 MG tablet Take 2 tablets by mouth 2 (two) times daily before a meal. 11/22/17   Nche, Charlene Brooke, NP  Icosapent Ethyl (VASCEPA) 1 g CAPS Take 2 g by mouth 2 (two) times daily. 11/11/17   Nche, Charlene Brooke, NP  ketoconazole (NIZORAL) 2 % cream Apply 1 application topically 2 (two) times daily. 11/11/17   Nche, Charlene Brooke, NP  QUEtiapine (SEROQUEL) 50 MG tablet Take 50 mg by mouth at bedtime.    [provider]  risperiDONE (RISPERDAL) 3 MG tablet Take 3 mg by mouth at bedtime.    [provider]    Family History Family History  Problem Relation Age of Onset  . Alcohol abuse Mother   . Arthritis Mother   . Diabetes Mother   . Hypertension Mother   . Hyperlipidemia Mother   . Alcohol abuse Father   . Arthritis Father   . Asthma Father   . Heart disease Father   . Hypertension Father   . Hyperlipidemia Father   . Alcohol abuse Brother   . Arthritis Brother   . Drug abuse Brother     Social History Social History   Tobacco Use  . Smoking status:  Former Smoker  . Smokeless tobacco: Never Used  Substance Use Topics  . Alcohol use: No    Frequency: Never  . Drug use: No     Allergies   Patient has no known allergies.   Review of Systems Review of Systems  All other systems reviewed and are negative.    Physical Exam Updated Vital Signs BP (!) 166/90 (BP Location: Left Arm)   Pulse (!) 101   Temp 98 F (36.7 C) (Oral)   Resp 18   Ht 5\' 3"  (1.6 m)   Wt 93.4 kg (206 lb)   SpO2 99%   BMI 36.49 kg/m   Physical Exam  Constitutional: She is oriented to person, place, and time. She appears well-developed and well-nourished.  HENT:  Head: Normocephalic.  Traumatic bite wound of the left ear superior helix with tissue loss.  No active bleeding.  (Please see picture for complete details).  Also with complex  laceration and tissue loss of the right nose near the nasofacial angle.  No involvement of the nasal ala.  No trismus or malocclusion.  Dentition normal.  Anterior neck normal.  Eyes: EOM are normal.  Neck: Normal range of motion.  Cardiovascular: Normal rate.  Pulmonary/Chest: Effort normal.  Abdominal: She exhibits no distension.  Musculoskeletal: Normal range of motion.  Full range of motion of bilateral shoulders, elbows and wrists. Full range of motion of bilateral hips, knees and ankles.    Neurological: She is alert and oriented to person, place, and time.  Psychiatric: She has a normal mood and affect.  Nursing note and vitals reviewed.        ED Treatments / Results  Labs (all labs ordered are listed, but only abnormal results are displayed) Labs Reviewed  BASIC METABOLIC PANEL - Abnormal; Notable for the following components:      Result Value   Potassium 5.6 (*)    CO2 21 (*)    Glucose, Bld 109 (*)    All other components within normal limits  CBG MONITORING, ED - Abnormal; Notable for the following components:   Glucose-Capillary 157 (*)    All other components within normal limits  CBC  GLUCOSE, CAPILLARY    EKG  EKG Interpretation None       Radiology No results found.  Procedures Procedures (including critical care time)  Medications Ordered in ED Medications - No data to display   Initial Impression / Assessment and Plan / ED Course  I have reviewed the triage vital signs and the nursing notes.  Pertinent labs & imaging results that were available during my care of the patient were reviewed by me and considered in my medical decision making (see chart for details).     Complex soft tissue facial injuries of the left ear in the right nose.  Case was discussed with Dr. Janace Hoard, ENT, who evaluated the patient emergency department and will plan on operative revision and washout of her wounds in the operating room.  IV Unasyn now.  N.p.o.   Preoperative labs and EKG.  Final Clinical Impressions(s) / ED Diagnoses   Final diagnoses:  Dog bite of face, initial encounter    ED Discharge Orders    None       Jola Schmidt, MD 12/13/17 [phone]

## 2017-12-13 NOTE — Anesthesia Procedure Notes (Signed)
Procedure Name: Intubation Date/Time: 12/13/2017 1:08 PM Performed by: Lollie Sails, CRNA Pre-anesthesia Checklist: Patient identified, Emergency Drugs available, Suction available, Patient being monitored and Timeout performed Patient Re-evaluated:Patient Re-evaluated prior to induction Oxygen Delivery Method: Circle system utilized Preoxygenation: Pre-oxygenation with 100% oxygen Induction Type: IV induction, Cricoid Pressure applied and Rapid sequence Laryngoscope Size: Miller and 3 Grade View: Grade I Tube type: Oral Tube size: 7.5 mm Airway Equipment and Method: Stylet Placement Confirmation: ETT inserted through vocal cords under direct vision,  positive ETCO2 and breath sounds checked- equal and bilateral Secured at: 23 cm Tube secured with: Tape Dental Injury: Teeth and Oropharynx as per pre-operative assessment

## 2017-12-13 NOTE — ED Notes (Signed)
Bed: YY34 Expected date:  Expected time:  Means of arrival:  Comments: 57 yo Dog bite

## 2017-12-13 NOTE — Progress Notes (Signed)
Discharge instructions and prescriptions reviewed with patient's husband- voices understanding

## 2017-12-13 NOTE — Anesthesia Preprocedure Evaluation (Signed)
Anesthesia Evaluation  Patient identified by MRN, date of birth, ID band Patient awake    Reviewed: Allergy & Precautions, NPO status , Patient's Chart, lab work & pertinent test results  Airway Mallampati: II  TM Distance: >3 FB Neck ROM: Full    Dental no notable dental hx.    Pulmonary former smoker,    Pulmonary exam normal breath sounds clear to auscultation       Cardiovascular hypertension, Pt. on medications + Peripheral Vascular Disease  Normal cardiovascular exam Rhythm:Regular Rate:Normal     Neuro/Psych PSYCHIATRIC DISORDERS Schizophrenia negative neurological ROS     GI/Hepatic negative GI ROS, Neg liver ROS,   Endo/Other  diabetes, Type 2Hypothyroidism   Renal/GU negative Renal ROS     Musculoskeletal  (+) Arthritis ,   Abdominal (+) + obese,   Peds  Hematology negative hematology ROS (+)   Anesthesia Other Findings   Reproductive/Obstetrics negative OB ROS                             Anesthesia Physical Anesthesia Plan  ASA: II  Anesthesia Plan: General   Post-op Pain Management:    Induction: Intravenous, Rapid sequence and Cricoid pressure planned  PONV Risk Score and Plan: 4 or greater and Dexamethasone, Ondansetron and Treatment may vary due to age or medical condition  Airway Management Planned: Oral ETT  Additional Equipment:   Intra-op Plan:   Post-operative Plan: Extubation in OR  Informed Consent: I have reviewed the patients History and Physical, chart, labs and discussed the procedure including the risks, benefits and alternatives for the proposed anesthesia with the patient or authorized representative who has indicated his/her understanding and acceptance.   Dental advisory given  Plan Discussed with: CRNA  Anesthesia Plan Comments:         Anesthesia Quick Evaluation

## 2017-12-13 NOTE — Consult Note (Signed)
Reason for Consult:dog bite to face Referring Physician: er  [name] [name] is an 57 y.o. female.  HPI: Hx of dog bite to the face few hours ago. She has some pain in the left ear. No hearing loss. She has some congestion of the nose. No vision changes. No breathing issues. She was attached by 3 pitbulls.   Past Medical History:  Diagnosis Date  . Chickenpox   . Diabetes mellitus without complication ([place])   . Elevated liver enzymes    ABD US done 08/2016: fatty liver  . Fatty liver    ABD US done 08/2016: fatty liver  . Hyperlipidemia   . Hypertension   . Hypothyroidism   . OA (osteoarthritis) of hip    right  . Schizophrenia ([place])   . Thyroid disease     No past surgical history on file.  Family History  Problem Relation Age of Onset  . Alcohol abuse Mother   . Arthritis Mother   . Diabetes Mother   . Hypertension Mother   . Hyperlipidemia Mother   . Alcohol abuse Father   . Arthritis Father   . Asthma Father   . Heart disease Father   . Hypertension Father   . Hyperlipidemia Father   . Alcohol abuse Brother   . Arthritis Brother   . Drug abuse Brother     Social History:  reports that she has quit smoking. she has never used smokeless tobacco. She reports that she does not drink alcohol or use drugs.  Allergies: No Known Allergies  Medications: I have reviewed the patient's current medications.  Results for orders placed or performed during the hospital encounter of 12/13/17 (from the past 48 hour(s))  POC CBG, ED     Status: Abnormal   Collection Time: 12/13/17 10:28 AM  Result Value Ref Range   Glucose-Capillary 157 (H) 65 - 99 mg/dL   Comment 1 Notify RN    Comment 2 Document in Chart     No results found.  Review of Systems  Constitutional: Negative.   HENT: Positive for ear pain.   Eyes: Negative.   Skin: Negative.    Blood pressure (!) 166/90, pulse (!) 101, temperature 98 F (36.7 C), temperature source Oral, resp. rate 18, height 5\' 3"  (1.6  m), weight 93.4 kg (206 lb), SpO2 99 %. Physical Exam  Constitutional: She appears well-developed and well-nourished.  HENT:  Head: Atraumatic.  Mouth/Throat: Oropharynx is clear and moist.  Complex stellate laceration of the nose with multiple lacerations. The lacerations do not appear to penetrate the inside. The left ear has a missing piece of the upper helix. About 2cm missing  Eyes: Conjunctivae are normal. Pupils are equal, round, and reactive to light.  Neck: Normal range of motion. Neck supple.  Cardiovascular: Normal rate.  Respiratory: Effort normal.  GI: Soft.    Assessment/Plan: Dog bites to the face- she will need closure and OR would be best options. We discussed the loss of tissue of the ear and this will need to be reconstructed with flap later. The nose may also have tissue loss and may need a local rotation. We discussed the risks, benefits, and options. All questions answered and consent obtained.   [name] 12/13/2017, 11:32 AM

## 2017-12-13 NOTE — ED Triage Notes (Signed)
Per EMS, pt was walking her own dog around, when the neighbors dogs got out and she was attacked by 3 pitbulls, part of left ear gone, bride of the nose and other punctures on face. Per owner of the dog, they are up to date on their shots. Pt did not fall, chronic hip pain. No blood thinners. No LOC. Pt has a hx of schizophrenia, PTSD, bipolar and DM. Alert and oriented.

## 2017-12-16 ENCOUNTER — Encounter (HOSPITAL_COMMUNITY): Payer: Self-pay | Admitting: Otolaryngology

## 2018-08-13 ENCOUNTER — Encounter: Payer: Self-pay | Admitting: Plastic Surgery

## 2018-08-13 ENCOUNTER — Ambulatory Visit (INDEPENDENT_AMBULATORY_CARE_PROVIDER_SITE_OTHER): Payer: Medicare PPO | Admitting: Plastic Surgery

## 2018-08-13 DIAGNOSIS — E119 Type 2 diabetes mellitus without complications: Secondary | ICD-10-CM | POA: Diagnosis not present

## 2018-08-13 DIAGNOSIS — S0185XA Open bite of other part of head, initial encounter: Secondary | ICD-10-CM | POA: Insufficient documentation

## 2018-08-13 DIAGNOSIS — W540XXA Bitten by dog, initial encounter: Secondary | ICD-10-CM | POA: Diagnosis not present

## 2018-08-13 DIAGNOSIS — S01359A Open bite of unspecified ear, initial encounter: Secondary | ICD-10-CM | POA: Insufficient documentation

## 2018-08-13 DIAGNOSIS — S01352A Open bite of left ear, initial encounter: Secondary | ICD-10-CM

## 2018-08-13 NOTE — Progress Notes (Signed)
Subjective:    Patient ID: [name] [name], female    DOB: Feb 02, 1961, 57 y.o.   MRN: [id number]  The patient is a 57 yrs old female here with her husband for evaluation of her face.  She was bitten by a pit bull 12/13/17.  Her nose and left ear were involved.  She was repaired in the ED by Dr. Jearld Fenton.  She is missing the upper helix of the left ear full thickness.  This is ~ 2 cm defect.  There is scaring, contracture and asymmetry of the right side of the nose.  The patient describes a pulling of the nose to the inner canthal area that is uncomfortable.  There is no sign of infection or foreign body at this time.  The right nose involvement is ~ 3 cm in size.     Review of Systems  Constitutional: Negative for activity change, appetite change and diaphoresis.  HENT: Positive for facial swelling. Negative for ear discharge and ear pain.   Eyes: Negative.   Respiratory: Negative.   Cardiovascular: Negative.  Negative for leg swelling.  Genitourinary: Negative.   Skin: Positive for color change. Negative for wound.  Hematological: Negative.   Psychiatric/Behavioral: Negative.     Current Outpatient Medications:  .  atorvastatin (LIPITOR) 40 MG tablet, Take 1 tablet (40 mg total) by mouth daily., Disp: 90 tablet, Rfl: 1 .  fluconazole (DIFLUCAN) 150 MG tablet, Take 1 tablet (150 mg total) by mouth daily. Take second tab 3days apart from first tab, Disp: 2 tablet, Rfl: 0 .  glipiZIDE-metformin (METAGLIP) 2.5-500 MG tablet, Take 2 tablets by mouth 2 (two) times daily before a meal., Disp: 30 tablet, Rfl: 0 .  HYDROcodone-acetaminophen (LORTAB) 5-325 MG tablet, Take 1 tablet by mouth every 6 (six) hours as needed for moderate pain., Disp: 30 tablet, Rfl: 0 .  ibuprofen (ADVIL,MOTRIN) 200 MG tablet, Take 800 mg by mouth daily as needed., Disp: , Rfl:  .  Icosapent Ethyl (VASCEPA) 1 g CAPS, Take 2 g by mouth 2 (two) times daily., Disp: 120 capsule, Rfl: 5 .  ketoconazole (NIZORAL) 2 % cream, Apply 1  application topically 2 (two) times daily., Disp: 60 g, Rfl: 1 .  QUEtiapine (SEROQUEL) 50 MG tablet, Take 50 mg by mouth at bedtime., Disp: , Rfl:  .  Amphetamine-Dextroamphetamine (AMPHETAMINE SALT COMBO PO), Take 5 mg by mouth 3 (three) times daily., Disp: , Rfl:      Objective:   Physical Exam  Constitutional: She appears well-developed and well-nourished.  HENT:  Head: Normocephalic.  Ears:  Nose:    Eyes: Pupils are equal, round, and reactive to light. EOM are normal.  Cardiovascular: Normal rate.  Pulmonary/Chest: No respiratory distress.  Neurological: She is alert.   Vitals:   08/13/18 0900  Pulse: 76  Resp: 12  SpO2: 96%  Weight: 210 lb (95.3 kg)  Height: 5\' 3"  (1.6 m)       Assessment & Plan:  Diabetes mellitus without complication ([facility])  Dog bite of face, initial encounter  Dog bite of left ear, initial encounter  Plan for tissue advancement of the left ear helix for repair.  Tissue advancement of the right cheek to nose for repair.

## 2018-08-14 ENCOUNTER — Telehealth: Payer: Self-pay | Admitting: Plastic Surgery

## 2018-08-14 NOTE — Telephone Encounter (Signed)
Patient requesting call back from Dr. Marla Roe in regards to visit yesterday, has more information to share

## 2018-09-16 ENCOUNTER — Encounter (HOSPITAL_BASED_OUTPATIENT_CLINIC_OR_DEPARTMENT_OTHER): Payer: Self-pay

## 2018-09-16 ENCOUNTER — Other Ambulatory Visit: Payer: Self-pay

## 2018-09-16 ENCOUNTER — Encounter: Payer: Medicare PPO | Admitting: Plastic Surgery

## 2018-09-19 ENCOUNTER — Ambulatory Visit: Payer: Self-pay | Admitting: Plastic Surgery

## 2018-09-19 ENCOUNTER — Encounter: Payer: Self-pay | Admitting: Plastic Surgery

## 2018-09-19 VITALS — BP 132/72 | HR 76 | Ht 63.0 in | Wt 218.0 lb

## 2018-09-19 DIAGNOSIS — S0185XA Open bite of other part of head, initial encounter: Secondary | ICD-10-CM

## 2018-09-19 DIAGNOSIS — W540XXA Bitten by dog, initial encounter: Secondary | ICD-10-CM

## 2018-09-19 DIAGNOSIS — S01352A Open bite of left ear, initial encounter: Secondary | ICD-10-CM

## 2018-09-20 ENCOUNTER — Encounter: Payer: Self-pay | Admitting: Plastic Surgery

## 2018-09-20 NOTE — H&P (View-Only) (Signed)
Patient ID: [name] [name], female    DOB: [birth year], 57 y.o.   MRN: [id number]   Chief Complaint  Patient presents with  . Skin Problem    The patient is a 57 year old white female here with her husband for preop history and physical for left ear and nose reconstruction.  8 months ago she was attacked by 3 dogs and sustained bites to her nose at the dorsum and right area.  She also had a bite to the left ear.  This created a deficit of the helix at the rim of the superior most portion.  Today she is doing well.  There is no sign of infection.  All the skin is healing well.  The nose has improved especially on the lateral right side.  There is some asymmetry of the dorsal medial aspect.  There is some fullness with a little bit of a deficit just inferior to that.  I think this can be improved upon.  The helix still has a 1 cm area of the superior lateral most portion missing.   Review of Systems  Constitutional: Negative.   HENT: Negative.  Negative for ear discharge, ear pain and facial swelling.   Eyes: Negative.  Negative for discharge.  Respiratory: Negative.   Cardiovascular: Negative.   Gastrointestinal: Negative.   Endocrine: Negative.   Genitourinary: Negative.  Negative for difficulty urinating.  Musculoskeletal: Negative.   Skin: Negative for color change and wound.  Allergic/Immunologic: Negative.   Neurological: Positive for dizziness.  Hematological: Negative.   Psychiatric/Behavioral: The patient is nervous/anxious.     Past Medical History:  Diagnosis Date  . Chickenpox   . Complication of anesthesia    anxiety and panic  . Diabetes mellitus without complication ([facility])    diet controlled, no longer needs meds  . Elevated liver enzymes    ABD US done 08/2016: fatty liver  . Fatty liver    ABD US done 08/2016: fatty liver  . Hyperlipidemia    no longer needs meds  . Hypertension    pt states no longer needs meds  . Hypothyroidism   . OA (osteoarthritis) of  hip    right  . Schizophrenia ([facility])   . Thyroid disease    no meds    Past Surgical History:  Procedure Laterality Date  . LACERATION REPAIR N/A 12/13/2017   Procedure: Closure of nasal lacerations with one rotator graft and one advanced flap with skin graft, closure of left nasal laceration, closure of left ear.;  Surgeon: Suzanna Obey, MD;  Location: [facility];  Service: ENT;  Laterality: N/A;      Current Outpatient Medications:  .  amphetamine-dextroamphetamine (ADDERALL) 5 MG tablet, Take 5 mg by mouth 2 (two) times daily with a meal., Disp: , Rfl:  .  BIOTIN PO, Take by mouth., Disp: , Rfl:  .  Cholecalciferol (VITAMIN D) 2000 units CAPS, Take by mouth., Disp: , Rfl:  .  GLYCINE PO, Take by mouth., Disp: , Rfl:  .  ibuprofen (ADVIL,MOTRIN) 200 MG tablet, Take 800 mg by mouth daily as needed., Disp: , Rfl:  .  NON FORMULARY, , Disp: , Rfl:  .  NON FORMULARY, , Disp: , Rfl:  .  NON FORMULARY, , Disp: , Rfl:  .  Omega-3 1000 MG CAPS, Take by mouth., Disp: , Rfl:  .  Pregnenolone POWD, by Does not apply route., Disp: , Rfl:  .  Probiotic Product (PROBIOTIC-10 PO), Take by mouth., Disp: , Rfl:  .  QUEtiapine (SEROQUEL) 50 MG tablet, Take 50 mg by mouth at bedtime., Disp: , Rfl:  .  risperiDONE (RISPERDAL) 1 MG tablet, Take 1 mg by mouth at bedtime., Disp: , Rfl:    Objective:   Vitals:   09/19/18 1032  BP: 132/72  Pulse: 76  SpO2: 98%    Physical Exam  Constitutional: She is oriented to person, place, and time. She appears well-developed and well-nourished.  HENT:  Head: Normocephalic.  Right Ear: External ear normal.  Eyes: Pupils are equal, round, and reactive to light. EOM are normal.  Cardiovascular: Normal rate.  Pulmonary/Chest: Effort normal. No respiratory distress.  Musculoskeletal: She exhibits no edema.  Neurological: She is alert and oriented to person, place, and time.  Skin: Skin is warm.  Psychiatric: She has a normal mood and affect. Her behavior is normal.  Thought content normal.    Assessment & Plan:  Dog bite of face, initial encounter  Dog bite of left ear, initial encounter  Repair of left ear with reconstruction most likely a tissue advancement.  Nasal reconstruction with a local flap.    The risks that can be encountered with and after surgery on the skin were discussed and include the following but not limited to these: bleeding, infection, delayed healing, anesthesia risks, skin sensation changes, injury to structures including nerves, blood vessels, and muscles which may be temporary or permanent, allergies to tape, suture materials and glues, blood products, topical preparations or injected agents, skin contour irregularities, skin discoloration and swelling, deep vein thrombosis, cardiac and pulmonary complications, pain, which may persist, persistent pain, recurrence of the lesion, poor healing of the incision, possible need for revisional surgery or staged procedures.   [name] , DO

## 2018-09-20 NOTE — Progress Notes (Signed)
Patient ID: [name] [name], female    DOB: [birth year], 57 y.o.   MRN: [id number]   Chief Complaint  Patient presents with  . Skin Problem    The patient is a 57 year old white female here with her husband for preop history and physical for left ear and nose reconstruction.  8 months ago she was attacked by 3 dogs and sustained bites to her nose at the dorsum and right area.  She also had a bite to the left ear.  This created a deficit of the helix at the rim of the superior most portion.  Today she is doing well.  There is no sign of infection.  All the skin is healing well.  The nose has improved especially on the lateral right side.  There is some asymmetry of the dorsal medial aspect.  There is some fullness with a little bit of a deficit just inferior to that.  I think this can be improved upon.  The helix still has a 1 cm area of the superior lateral most portion missing.   Review of Systems  Constitutional: Negative.   HENT: Negative.  Negative for ear discharge, ear pain and facial swelling.   Eyes: Negative.  Negative for discharge.  Respiratory: Negative.   Cardiovascular: Negative.   Gastrointestinal: Negative.   Endocrine: Negative.   Genitourinary: Negative.  Negative for difficulty urinating.  Musculoskeletal: Negative.   Skin: Negative for color change and wound.  Allergic/Immunologic: Negative.   Neurological: Positive for dizziness.  Hematological: Negative.   Psychiatric/Behavioral: The patient is nervous/anxious.     Past Medical History:  Diagnosis Date  . Chickenpox   . Complication of anesthesia    anxiety and panic  . Diabetes mellitus without complication (HCC)    diet controlled, no longer needs meds  . Elevated liver enzymes    ABD US done 08/2016: fatty liver  . Fatty liver    ABD US done 08/2016: fatty liver  . Hyperlipidemia    no longer needs meds  . Hypertension    pt states no longer needs meds  . Hypothyroidism   . OA (osteoarthritis) of  hip    right  . Schizophrenia (HCC)   . Thyroid disease    no meds    Past Surgical History:  Procedure Laterality Date  . LACERATION REPAIR N/A 12/13/2017   Procedure: Closure of nasal lacerations with one rotator graft and one advanced flap with skin graft, closure of left nasal laceration, closure of left ear.;  Surgeon: Suzanna Obey, MD;  Location: [facility];  Service: ENT;  Laterality: N/A;      Current Outpatient Medications:  .  amphetamine-dextroamphetamine (ADDERALL) 5 MG tablet, Take 5 mg by mouth 2 (two) times daily with a meal., Disp: , Rfl:  .  BIOTIN PO, Take by mouth., Disp: , Rfl:  .  Cholecalciferol (VITAMIN D) 2000 units CAPS, Take by mouth., Disp: , Rfl:  .  GLYCINE PO, Take by mouth., Disp: , Rfl:  .  ibuprofen (ADVIL,MOTRIN) 200 MG tablet, Take 800 mg by mouth daily as needed., Disp: , Rfl:  .  NON FORMULARY, , Disp: , Rfl:  .  NON FORMULARY, , Disp: , Rfl:  .  NON FORMULARY, , Disp: , Rfl:  .  Omega-3 1000 MG CAPS, Take by mouth., Disp: , Rfl:  .  Pregnenolone POWD, by Does not apply route., Disp: , Rfl:  .  Probiotic Product (PROBIOTIC-10 PO), Take by mouth., Disp: , Rfl:  .  QUEtiapine (SEROQUEL) 50 MG tablet, Take 50 mg by mouth at bedtime., Disp: , Rfl:  .  risperiDONE (RISPERDAL) 1 MG tablet, Take 1 mg by mouth at bedtime., Disp: , Rfl:    Objective:   Vitals:   09/19/18 1032  BP: 132/72  Pulse: 76  SpO2: 98%    Physical Exam  Constitutional: She is oriented to person, place, and time. She appears well-developed and well-nourished.  HENT:  Head: Normocephalic.  Right Ear: External ear normal.  Eyes: Pupils are equal, round, and reactive to light. EOM are normal.  Cardiovascular: Normal rate.  Pulmonary/Chest: Effort normal. No respiratory distress.  Musculoskeletal: She exhibits no edema.  Neurological: She is alert and oriented to person, place, and time.  Skin: Skin is warm.  Psychiatric: She has a normal mood and affect. Her behavior is normal.  Thought content normal.    Assessment & Plan:  Dog bite of face, initial encounter  Dog bite of left ear, initial encounter  Repair of left ear with reconstruction most likely a tissue advancement.  Nasal reconstruction with a local flap.    The risks that can be encountered with and after surgery on the skin were discussed and include the following but not limited to these: bleeding, infection, delayed healing, anesthesia risks, skin sensation changes, injury to structures including nerves, blood vessels, and muscles which may be temporary or permanent, allergies to tape, suture materials and glues, blood products, topical preparations or injected agents, skin contour irregularities, skin discoloration and swelling, deep vein thrombosis, cardiac and pulmonary complications, pain, which may persist, persistent pain, recurrence of the lesion, poor healing of the incision, possible need for revisional surgery or staged procedures.   [name] , DO

## 2018-09-24 ENCOUNTER — Ambulatory Visit (HOSPITAL_BASED_OUTPATIENT_CLINIC_OR_DEPARTMENT_OTHER): Payer: Medicare PPO | Admitting: Anesthesiology

## 2018-09-24 ENCOUNTER — Encounter (HOSPITAL_BASED_OUTPATIENT_CLINIC_OR_DEPARTMENT_OTHER)
Admission: RE | Disposition: A | Discharge status: Home / Self Care | Payer: Self-pay | Source: Ambulatory Visit | Attending: Plastic Surgery

## 2018-09-24 ENCOUNTER — Other Ambulatory Visit: Payer: Self-pay

## 2018-09-24 ENCOUNTER — Ambulatory Visit (HOSPITAL_BASED_OUTPATIENT_CLINIC_OR_DEPARTMENT_OTHER)
Admission: RE | Admit: 2018-09-24 | Discharge: 2018-09-24 | Disposition: A | Discharge status: Home / Self Care | Payer: Medicare PPO | Source: Ambulatory Visit | Attending: Plastic Surgery | Admitting: Plastic Surgery

## 2018-09-24 ENCOUNTER — Encounter (HOSPITAL_BASED_OUTPATIENT_CLINIC_OR_DEPARTMENT_OTHER): Payer: Self-pay | Admitting: *Deleted

## 2018-09-24 DIAGNOSIS — E669 Obesity, unspecified: Secondary | ICD-10-CM | POA: Diagnosis not present

## 2018-09-24 DIAGNOSIS — Z79899 Other long term (current) drug therapy: Secondary | ICD-10-CM | POA: Diagnosis not present

## 2018-09-24 DIAGNOSIS — Z79890 Hormone replacement therapy (postmenopausal): Secondary | ICD-10-CM | POA: Insufficient documentation

## 2018-09-24 DIAGNOSIS — L905 Scar conditions and fibrosis of skin: Secondary | ICD-10-CM

## 2018-09-24 DIAGNOSIS — F209 Schizophrenia, unspecified: Secondary | ICD-10-CM | POA: Insufficient documentation

## 2018-09-24 DIAGNOSIS — W540XXD Bitten by dog, subsequent encounter: Secondary | ICD-10-CM | POA: Diagnosis not present

## 2018-09-24 DIAGNOSIS — E039 Hypothyroidism, unspecified: Secondary | ICD-10-CM | POA: Insufficient documentation

## 2018-09-24 DIAGNOSIS — I10 Essential (primary) hypertension: Secondary | ICD-10-CM | POA: Diagnosis not present

## 2018-09-24 DIAGNOSIS — H61112 Acquired deformity of pinna, left ear: Secondary | ICD-10-CM | POA: Insufficient documentation

## 2018-09-24 DIAGNOSIS — M1990 Unspecified osteoarthritis, unspecified site: Secondary | ICD-10-CM | POA: Insufficient documentation

## 2018-09-24 DIAGNOSIS — Z6838 Body mass index (BMI) 38.0-38.9, adult: Secondary | ICD-10-CM | POA: Insufficient documentation

## 2018-09-24 DIAGNOSIS — E119 Type 2 diabetes mellitus without complications: Secondary | ICD-10-CM | POA: Insufficient documentation

## 2018-09-24 HISTORY — DX: Adverse effect of unspecified anesthetic, initial encounter: T41.45XA

## 2018-09-24 HISTORY — DX: Other complications of anesthesia, initial encounter: T88.59XA

## 2018-09-24 SURGERY — ADJACENT TISSUE TRANSFER
Anesthesia: General | Site: Face | Laterality: Bilateral

## 2018-09-24 MED ORDER — SUCCINYLCHOLINE CHLORIDE 20 MG/ML IJ SOLN
INTRAMUSCULAR | Status: DC | PRN
  Administered 2018-09-24: 100 mg via INTRAVENOUS
  Administered 2018-09-24: 4 mg via INTRAVENOUS

## 2018-09-24 MED ORDER — LIDOCAINE HCL (CARDIAC) PF 100 MG/5ML IV SOSY
PREFILLED_SYRINGE | INTRAVENOUS | Status: DC | PRN
  Administered 2018-09-24: 80 mg via INTRAVENOUS

## 2018-09-24 MED ORDER — LACTATED RINGERS IV SOLN
INTRAVENOUS | Status: DC | PRN
  Administered 2018-09-24: 08:00:00 via INTRAVENOUS

## 2018-09-24 MED ORDER — LIDOCAINE-EPINEPHRINE 1 %-1:100000 IJ SOLN
INTRAMUSCULAR | Status: AC
  Filled 2018-09-24: qty 5

## 2018-09-24 MED ORDER — FENTANYL CITRATE (PF) 100 MCG/2ML IJ SOLN
INTRAMUSCULAR | Status: AC
  Filled 2018-09-24: qty 2

## 2018-09-24 MED ORDER — PHENYLEPHRINE 40 MCG/ML (10ML) SYRINGE FOR IV PUSH (FOR BLOOD PRESSURE SUPPORT)
PREFILLED_SYRINGE | INTRAVENOUS | Status: AC
  Filled 2018-09-24: qty 10

## 2018-09-24 MED ORDER — EPHEDRINE 5 MG/ML INJ
INTRAVENOUS | Status: AC
  Filled 2018-09-24: qty 10

## 2018-09-24 MED ORDER — LIDOCAINE 2% (20 MG/ML) 5 ML SYRINGE
INTRAMUSCULAR | Status: AC
  Filled 2018-09-24: qty 5

## 2018-09-24 MED ORDER — PROMETHAZINE HCL 25 MG/ML IJ SOLN: 6.2500 mg | INTRAMUSCULAR | Status: DC | PRN

## 2018-09-24 MED ORDER — PROPOFOL 500 MG/50ML IV EMUL
INTRAVENOUS | Status: AC
  Filled 2018-09-24: qty 50

## 2018-09-24 MED ORDER — CEFAZOLIN SODIUM-DEXTROSE 2-3 GM-%(50ML) IV SOLR
INTRAVENOUS | Status: DC | PRN
  Administered 2018-09-24: 2 g via INTRAVENOUS

## 2018-09-24 MED ORDER — SODIUM CHLORIDE 0.9% FLUSH: 3.0000 mL | INTRAVENOUS | Status: DC | PRN

## 2018-09-24 MED ORDER — ONDANSETRON HCL 4 MG/2ML IJ SOLN
INTRAMUSCULAR | Status: AC
  Filled 2018-09-24: qty 2

## 2018-09-24 MED ORDER — SODIUM CHLORIDE 0.9% FLUSH: 3.0000 mL | Freq: Two times a day (BID) | INTRAVENOUS | Status: DC

## 2018-09-24 MED ORDER — FENTANYL CITRATE (PF) 100 MCG/2ML IJ SOLN: 25.0000 ug | INTRAMUSCULAR | Status: DC | PRN

## 2018-09-24 MED ORDER — BACITRACIN-NEOMYCIN-POLYMYXIN 400-5-5000 EX OINT
TOPICAL_OINTMENT | CUTANEOUS | Status: AC
  Filled 2018-09-24: qty 1

## 2018-09-24 MED ORDER — SODIUM CHLORIDE 0.9 % IV SOLN: 250.0000 mL | INTRAVENOUS | Status: DC | PRN

## 2018-09-24 MED ORDER — CEFAZOLIN SODIUM-DEXTROSE 2-4 GM/100ML-% IV SOLN
INTRAVENOUS | Status: AC
  Filled 2018-09-24: qty 100

## 2018-09-24 MED ORDER — OXYMETAZOLINE HCL 0.05 % NA SOLN
NASAL | Status: AC
  Filled 2018-09-24: qty 15

## 2018-09-24 MED ORDER — PROPOFOL 10 MG/ML IV BOLUS
INTRAVENOUS | Status: DC | PRN
  Administered 2018-09-24: 150 mg via INTRAVENOUS

## 2018-09-24 MED ORDER — AMOXICILLIN-POT CLAVULANATE 500-125 MG PO TABS: 1.0000 | ORAL_TABLET | Freq: Three times a day (TID) | ORAL | 0 refills | Status: AC

## 2018-09-24 MED ORDER — MIDAZOLAM HCL 2 MG/2ML IJ SOLN
INTRAMUSCULAR | Status: AC
  Filled 2018-09-24: qty 2

## 2018-09-24 MED ORDER — FENTANYL CITRATE (PF) 100 MCG/2ML IJ SOLN
INTRAMUSCULAR | Status: DC | PRN
  Administered 2018-09-24: 100 ug via INTRAVENOUS

## 2018-09-24 MED ORDER — DEXMEDETOMIDINE HCL IN NACL 200 MCG/50ML IV SOLN
INTRAVENOUS | Status: DC | PRN
  Administered 2018-09-24: 20 ug via INTRAVENOUS

## 2018-09-24 MED ORDER — ACETAMINOPHEN 650 MG RE SUPP: 650.0000 mg | RECTAL | Status: DC | PRN

## 2018-09-24 MED ORDER — BACITRACIN ZINC 500 UNIT/GM EX OINT
TOPICAL_OINTMENT | CUTANEOUS | Status: AC
  Filled 2018-09-24: qty 1.8

## 2018-09-24 MED ORDER — ACETAMINOPHEN 500 MG PO TABS: 500.0000 mg | ORAL_TABLET | ORAL | Status: DC | PRN

## 2018-09-24 MED ORDER — ONDANSETRON HCL 4 MG/2ML IJ SOLN
INTRAMUSCULAR | Status: DC | PRN
  Administered 2018-09-24: 4 mg via INTRAVENOUS

## 2018-09-24 MED ORDER — DEXAMETHASONE SODIUM PHOSPHATE 10 MG/ML IJ SOLN
INTRAMUSCULAR | Status: AC
  Filled 2018-09-24: qty 1

## 2018-09-24 MED ORDER — EPHEDRINE SULFATE 50 MG/ML IJ SOLN
INTRAMUSCULAR | Status: DC | PRN
  Administered 2018-09-24: 15 mg via INTRAVENOUS

## 2018-09-24 MED ORDER — BUPIVACAINE HCL (PF) 0.25 % IJ SOLN
INTRAMUSCULAR | Status: AC
  Filled 2018-09-24: qty 30

## 2018-09-24 MED ORDER — LIDOCAINE-EPINEPHRINE 1 %-1:100000 IJ SOLN
INTRAMUSCULAR | Status: DC | PRN
  Administered 2018-09-24: 2 mL

## 2018-09-24 MED ORDER — SUCCINYLCHOLINE CHLORIDE 200 MG/10ML IV SOSY
PREFILLED_SYRINGE | INTRAVENOUS | Status: AC
  Filled 2018-09-24: qty 10

## 2018-09-24 MED ORDER — MIDAZOLAM HCL 5 MG/5ML IJ SOLN
INTRAMUSCULAR | Status: DC | PRN
  Administered 2018-09-24: 2 mg via INTRAVENOUS

## 2018-09-24 MED ORDER — CEFAZOLIN SODIUM-DEXTROSE 2-4 GM/100ML-% IV SOLN: 2.0000 g | INTRAVENOUS | Status: DC

## 2018-09-24 MED ORDER — BSS IO SOLN
INTRAOCULAR | Status: AC
  Filled 2018-09-24: qty 15

## 2018-09-24 MED ORDER — BUPIVACAINE-EPINEPHRINE (PF) 0.25% -1:200000 IJ SOLN
INTRAMUSCULAR | Status: AC
  Filled 2018-09-24: qty 120

## 2018-09-24 SURGICAL SUPPLY — 57 items
BLADE SURG 11 STRL SS (BLADE) ×3 IMPLANT
BLADE SURG 15 STRL LF DISP TIS (BLADE) ×1 IMPLANT
BLADE SURG 15 STRL SS (BLADE) ×2
BNDG CONFORM 2 STRL LF (GAUZE/BANDAGES/DRESSINGS) IMPLANT
BNDG ELASTIC 2X5.8 VLCR STR LF (GAUZE/BANDAGES/DRESSINGS) IMPLANT
CANISTER SUCT 1200ML W/VALVE (MISCELLANEOUS) IMPLANT
CLOSURE STERI-STRIP 1/2X4 (GAUZE/BANDAGES/DRESSINGS) ×1
CLSR STERI-STRIP ANTIMIC 1/2X4 (GAUZE/BANDAGES/DRESSINGS) ×2 IMPLANT
COTTONBALL LRG STERILE PKG (GAUZE/BANDAGES/DRESSINGS) IMPLANT
COVER BACK TABLE 60X90IN (DRAPES) ×3 IMPLANT
COVER MAYO STAND STRL (DRAPES) ×3 IMPLANT
DERMABOND ADVANCED (GAUZE/BANDAGES/DRESSINGS)
DERMABOND ADVANCED .7 DNX12 (GAUZE/BANDAGES/DRESSINGS) IMPLANT
DRAPE U-SHAPE 76X120 STRL (DRAPES) ×3 IMPLANT
ELECT NEEDLE BLADE 2-5/6 (NEEDLE) ×3 IMPLANT
ELECT REM PT RETURN 9FT ADLT (ELECTROSURGICAL) ×3
ELECTRODE REM PT RTRN 9FT ADLT (ELECTROSURGICAL) ×1 IMPLANT
GAUZE SPONGE 4X4 12PLY STRL LF (GAUZE/BANDAGES/DRESSINGS) IMPLANT
GAUZE XEROFORM 1X8 LF (GAUZE/BANDAGES/DRESSINGS) IMPLANT
GAUZE XEROFORM 5X9 LF (GAUZE/BANDAGES/DRESSINGS) IMPLANT
GLOVE BIO SURGEON STRL SZ 6.5 (GLOVE) ×2 IMPLANT
GLOVE BIO SURGEON STRL SZ7 (GLOVE) ×3 IMPLANT
GLOVE BIO SURGEONS STRL SZ 6.5 (GLOVE) ×1
GLOVE BIOGEL PI IND STRL 7.0 (GLOVE) ×2 IMPLANT
GLOVE BIOGEL PI INDICATOR 7.0 (GLOVE) ×4
GOWN STRL REUS W/ TWL LRG LVL3 (GOWN DISPOSABLE) ×2 IMPLANT
GOWN STRL REUS W/TWL LRG LVL3 (GOWN DISPOSABLE) ×4
NEEDLE HYPO 30GX1 BEV (NEEDLE) IMPLANT
NEEDLE PRECISIONGLIDE 27X1.5 (NEEDLE) ×3 IMPLANT
NS IRRIG 1000ML POUR BTL (IV SOLUTION) IMPLANT
PACK BASIN DAY SURGERY FS (CUSTOM PROCEDURE TRAY) ×3 IMPLANT
PENCIL BUTTON HOLSTER BLD 10FT (ELECTRODE) ×3 IMPLANT
SPONGE GAUZE 2X2 8PLY STER LF (GAUZE/BANDAGES/DRESSINGS)
SPONGE GAUZE 2X2 8PLY STRL LF (GAUZE/BANDAGES/DRESSINGS) IMPLANT
SUCTION FRAZIER HANDLE 10FR (MISCELLANEOUS) ×2
SUCTION TUBE FRAZIER 10FR DISP (MISCELLANEOUS) ×1 IMPLANT
SUT CHROMIC 4 0 P 3 18 (SUTURE) IMPLANT
SUT ETHILON 4 0 PS 2 18 (SUTURE) IMPLANT
SUT ETHILON 5 0 P 3 18 (SUTURE)
SUT MNCRL 6-0 UNDY P1 1X18 (SUTURE) ×2 IMPLANT
SUT MNCRL AB 4-0 PS2 18 (SUTURE) IMPLANT
SUT MON AB 5-0 P3 18 (SUTURE) ×6 IMPLANT
SUT MONOCRYL 6-0 P1 1X18 (SUTURE) ×4
SUT NYLON ETHILON 5-0 P-3 1X18 (SUTURE) IMPLANT
SUT PLAIN 5 0 P 3 18 (SUTURE) IMPLANT
SUT PROLENE 5 0 P 3 (SUTURE) IMPLANT
SUT PROLENE 6 0 P 1 18 (SUTURE) IMPLANT
SUT VIC AB 5-0 P-3 18X BRD (SUTURE) IMPLANT
SUT VIC AB 5-0 P3 18 (SUTURE)
SUT VICRYL 4-0 PS2 18IN ABS (SUTURE) IMPLANT
SYR BULB 3OZ (MISCELLANEOUS) IMPLANT
SYR CONTROL 10ML LL (SYRINGE) ×3 IMPLANT
TAPE STRIPS DRAPE STRL (GAUZE/BANDAGES/DRESSINGS) IMPLANT
TOWEL GREEN STERILE FF (TOWEL DISPOSABLE) ×3 IMPLANT
TRAY DSU PREP LF (CUSTOM PROCEDURE TRAY) ×3 IMPLANT
TUBE CONNECTING 20'X1/4 (TUBING) ×1
TUBE CONNECTING 20X1/4 (TUBING) ×2 IMPLANT

## 2018-09-24 NOTE — Transfer of Care (Signed)
Immediate Anesthesia Transfer of Care Note  Patient: Systems analyst  Procedure(s) Performed: ADJACENT TISSUE TRANSFER/TISSUE REARRANGEMENT OF RIGHT NOSE AND LEFT EAR (Bilateral Face)  Patient Location: PACU  Anesthesia Type:General  Level of Consciousness: sedated  Airway & Oxygen Therapy: Patient Spontanous Breathing and Patient connected to face mask oxygen  Post-op Assessment: Report given to RN and Post -op Vital signs reviewed and stable  Post vital signs: Reviewed and stable  Last Vitals:  Vitals Value Taken Time  BP 135/77 09/24/2018  9:30 AM  Temp 36.6 C 09/24/2018  9:30 AM  Pulse 84 09/24/2018  9:32 AM  Resp 20 09/24/2018  9:32 AM  SpO2 94 % 09/24/2018  9:32 AM  Vitals shown include unvalidated device data.  Last Pain:  Vitals:   09/24/18 0729  TempSrc: Oral  PainSc: 3       Patients Stated Pain Goal: 4 (41/14/64 3142)  Complications: No apparent anesthesia complications

## 2018-09-24 NOTE — Anesthesia Preprocedure Evaluation (Addendum)
Anesthesia Evaluation  Patient identified by MRN, date of birth, ID band Patient awake    Reviewed: Allergy & Precautions, NPO status , Patient's Chart, lab work & pertinent test results  History of Anesthesia Complications (+) history of anesthetic complications (Anxiety and panic)  Airway Mallampati: II  TM Distance: >3 FB Neck ROM: Full    Dental  (+) Teeth Intact, Dental Advisory Given   Pulmonary former smoker,    Pulmonary exam normal breath sounds clear to auscultation       Cardiovascular hypertension, Normal cardiovascular exam Rhythm:Regular Rate:Normal     Neuro/Psych PSYCHIATRIC DISORDERS Schizophrenia negative neurological ROS     GI/Hepatic negative GI ROS, Neg liver ROS,   Endo/Other  diabetes, Well Controlled, Type 2Hypothyroidism Obesity   Renal/GU negative Renal ROS     Musculoskeletal  (+) Arthritis ,   Abdominal   Peds  Hematology negative hematology ROS (+)   Anesthesia Other Findings Day of surgery medications reviewed with the patient.  Reproductive/Obstetrics                             Anesthesia Physical Anesthesia Plan  ASA: II  Anesthesia Plan: General   Post-op Pain Management:    Induction: Intravenous  PONV Risk Score and Plan: 3 and Midazolam, Dexamethasone and Ondansetron  Airway Management Planned: Oral ETT  Additional Equipment:   Intra-op Plan:   Post-operative Plan: Extubation in OR  Informed Consent: I have reviewed the patients History and Physical, chart, labs and discussed the procedure including the risks, benefits and alternatives for the proposed anesthesia with the patient or authorized representative who has indicated his/her understanding and acceptance.   Dental advisory given  Plan Discussed with: CRNA  Anesthesia Plan Comments:       Anesthesia Quick Evaluation

## 2018-09-24 NOTE — Anesthesia Postprocedure Evaluation (Signed)
Anesthesia Post Note  Patient: Systems analyst  Procedure(s) Performed: ADJACENT TISSUE TRANSFER/TISSUE REARRANGEMENT OF RIGHT NOSE AND LEFT EAR (Bilateral Face)     Patient location during evaluation: PACU Anesthesia Type: General Level of consciousness: awake and alert, awake and oriented Pain management: pain level controlled Vital Signs Assessment: post-procedure vital signs reviewed and stable Respiratory status: spontaneous breathing, nonlabored ventilation and respiratory function stable Cardiovascular status: blood pressure returned to baseline and stable Postop Assessment: no apparent nausea or vomiting Anesthetic complications: no    Last Vitals:  Vitals:   09/24/18 1000 09/24/18 1031  BP: 116/78 128/84  Pulse: 80 79  Resp: 11 16  Temp:  36.8 C  SpO2: 94% 94%    Last Pain:  Vitals:   09/24/18 1031  TempSrc:   PainSc: 0-No pain                 Catalina Gravel

## 2018-09-24 NOTE — Discharge Instructions (Signed)
No heavy lifting. May shower and get area wet. Sleep with head elevated for 3 days.    Post Anesthesia Home Care Instructions  Activity: Get plenty of rest for the remainder of the day. A responsible individual must stay with you for 24 hours following the procedure.  For the next 24 hours, DO NOT: -Drive a car -Paediatric nurse -Drink alcoholic beverages -Take any medication unless instructed by your physician -Make any legal decisions or sign important papers.  Meals: Start with liquid foods such as gelatin or soup. Progress to regular foods as tolerated. Avoid greasy, spicy, heavy foods. If nausea and/or vomiting occur, drink only clear liquids until the nausea and/or vomiting subsides. Call your physician if vomiting continues.  Special Instructions/Symptoms: Your throat may feel dry or sore from the anesthesia or the breathing tube placed in your throat during surgery. If this causes discomfort, gargle with warm salt water. The discomfort should disappear within 24 hours.  If you had a scopolamine patch placed behind your ear for the management of post- operative nausea and/or vomiting:  1. The medication in the patch is effective for 72 hours, after which it should be removed.  Wrap patch in a tissue and discard in the trash. Wash hands thoroughly with soap and water. 2. You may remove the patch earlier than 72 hours if you experience unpleasant side effects which may include dry mouth, dizziness or visual disturbances. 3. Avoid touching the patch. Wash your hands with soap and water after contact with the patch.

## 2018-09-24 NOTE — Anesthesia Procedure Notes (Addendum)
Procedure Name: Intubation Date/Time: 09/24/2018 8:21 AM Performed by: Willa Frater, CRNA Pre-anesthesia Checklist: Patient identified, Emergency Drugs available, Suction available and Patient being monitored Patient Re-evaluated:Patient Re-evaluated prior to induction Oxygen Delivery Method: Circle system utilized Preoxygenation: Pre-oxygenation with 100% oxygen Induction Type: IV induction Ventilation: Mask ventilation without difficulty Laryngoscope Size: Mac and 3 Grade View: Grade II Tube type: Oral Tube size: 7.0 mm Number of attempts: 1 Airway Equipment and Method: Stylet and Oral airway Placement Confirmation: ETT inserted through vocal cords under direct vision,  positive ETCO2 and breath sounds checked- equal and bilateral Secured at: 22 cm Tube secured with: Tape Dental Injury: Teeth and Oropharynx as per pre-operative assessment

## 2018-09-24 NOTE — Addendum Note (Signed)
Addended by: Wallace Going on: 09/24/2018 08:02 AM   Modules accepted: Orders

## 2018-09-24 NOTE — Op Note (Signed)
DATE OF OPERATION: 09/24/2018  LOCATION: [facility]  PREOPERATIVE DIAGNOSIS: left ear deformity and nose scar contracture from dog bite  POSTOPERATIVE DIAGNOSIS: Same  PROCEDURE:reconstruction of left ear with tissue advancement 2 cm and excision of nose scar contracture with primary repair 5 mm  SURGEON: Claire Sanger Dillingham, DO  ASSISTANT: Ronette Deter, PA  EBL: none  CONDITION: Stable  COMPLICATIONS: None  INDICATION: The patient, [name] [name], is a 57 y.o. female born on [birth year], is here for treatment of a defect / deformity to the left ear.   PROCEDURE DETAILS:  The patient was seen prior to surgery and marked.  The IV antibiotics were given. The patient was taken to the operating room and given a general anesthetic. A standard time out was performed and all information was confirmed by those in the room. SCDs were placed.   The patient was prepped and draped.  Local was injected in the left ear and nose area.  After waiting for the local to take effect the area of the left ear was marked.  The defect was at the outer rim of the superior portion of the helix.  The #11 blade was used to excise a V shaped portion of the antihelix 1 cm.  The skin of the ear was then freed superiorly and inferiorly to free it from the cartilage 2 cm.  This skin was then advance to close the defect.  The cartilage was brought together and secure with the 5-0 Monocryl using vertical mattress sutures.  The skin was then closed with vertical mattress 6-0 Monocryl sutures. Derma bond was applied to the tip.  Attention was then turned to the nose.  The norsal right aspect of the nose had a thick and contracting scar.  This was excised 5 mm.  The scissor were used to free the scar contracture from the underlying tissue for 5 mm superiorly and inferiorly.  The bovie was used to obtain hemostasis.  The skin was then closed with the 6-0 Monocryl.  Steri strips were applied.  The patient was  allowed to wake up and taken to recovery room in stable condition at the end of the case. The family was notified at the end of the case.

## 2018-09-24 NOTE — Interval H&P Note (Signed)
History and Physical Interval Note:  09/24/2018 7:18 AM  [name] [name]  has presented today for surgery, with the diagnosis of repair dog bite injury  The various methods of treatment have been discussed with the patient and family. After consideration of risks, benefits and other options for treatment, the patient has consented to  Procedure(s): ADJACENT TISSUE TRANSFER/TISSUE REARRANGEMENT (Bilateral) as a surgical intervention .  The patient's history has been reviewed, patient examined, no change in status, stable for surgery.  I have reviewed the patient's chart and labs.  Questions were answered to the patient's satisfaction.     Loel Lofty Aydrian Halpin

## 2018-09-30 ENCOUNTER — Encounter: Payer: Self-pay | Admitting: Plastic Surgery

## 2018-09-30 ENCOUNTER — Ambulatory Visit (INDEPENDENT_AMBULATORY_CARE_PROVIDER_SITE_OTHER): Payer: Medicare PPO | Admitting: Plastic Surgery

## 2018-09-30 VITALS — BP 128/76 | HR 68 | Ht 63.0 in | Wt 230.0 lb

## 2018-09-30 DIAGNOSIS — S0185XD Open bite of other part of head, subsequent encounter: Secondary | ICD-10-CM

## 2018-09-30 DIAGNOSIS — W540XXD Bitten by dog, subsequent encounter: Secondary | ICD-10-CM

## 2018-09-30 NOTE — Progress Notes (Signed)
Subjective:    Patient ID: [name] [name], female    DOB: 07-30-1961, 57 y.o.   MRN: [id number]  The patient is a 57 yrs old wf here for follow up on her left ear and nose surgery.  She is doing well.  All areas healing well.  No sign of infection.  The ear is swollen as expected and the nose is bruised as expected.     Review of Systems  Constitutional: Negative.   Respiratory: Negative.   Musculoskeletal: Negative.   Hematological: Negative.   Psychiatric/Behavioral: Negative.        Objective:   Physical Exam  Constitutional: She appears well-developed and well-nourished.  HENT:  Head: Normocephalic.  Cardiovascular: Normal rate.  Pulmonary/Chest: Effort normal.  Neurological: She is alert.  Skin: Skin is warm.  Psychiatric: She has a normal mood and affect. Her behavior is normal.      Assessment & Plan:  Dog bite of face, subsequent encounter  Steri strips removed from the nose and sutures.  New steri strip applied.

## 2018-10-07 ENCOUNTER — Encounter: Payer: Self-pay | Admitting: Plastic Surgery

## 2018-10-07 ENCOUNTER — Ambulatory Visit (INDEPENDENT_AMBULATORY_CARE_PROVIDER_SITE_OTHER): Payer: Medicare PPO | Admitting: Plastic Surgery

## 2018-10-07 DIAGNOSIS — W540XXA Bitten by dog, initial encounter: Secondary | ICD-10-CM

## 2018-10-07 DIAGNOSIS — S01352A Open bite of left ear, initial encounter: Secondary | ICD-10-CM

## 2018-10-07 DIAGNOSIS — S0185XD Open bite of other part of head, subsequent encounter: Secondary | ICD-10-CM

## 2018-10-07 DIAGNOSIS — W540XXD Bitten by dog, subsequent encounter: Secondary | ICD-10-CM

## 2018-10-07 NOTE — Progress Notes (Signed)
Subjective:    Patient ID: [name] [name], female    DOB: July 08, 1961, 57 y.o.   MRN: [id number]  Patient is a 57 year old wf here with her husband for follow-up on her nose and ear surgery.  The swelling has improved on her ear and her nose.  She is very pleased with the results.  There is no sign of infection.  The sutures are in place and ready for removal.   Review of Systems  Constitutional: Negative.   HENT: Negative.   Respiratory: Negative.   Cardiovascular: Negative.   Gastrointestinal: Negative.   Genitourinary: Negative.   Musculoskeletal: Negative.        Objective:   Physical Exam  Constitutional: She appears well-developed and well-nourished.  HENT:  Head: Normocephalic.  Cardiovascular: Normal rate.  Pulmonary/Chest: Effort normal.  Neurological: She is alert.  Psychiatric: She has a normal mood and affect. Her behavior is normal. Thought content normal.       Assessment & Plan:  Dog bite of left ear, initial encounter  Dog bite of face, subsequent encounter Plan for sun avoidance.  Massage to the nose and ear.  Follow-up as needed Picture taken.

## 2019-02-05 ENCOUNTER — Other Ambulatory Visit: Payer: Self-pay | Admitting: Family Medicine

## 2019-02-05 DIAGNOSIS — Z1231 Encounter for screening mammogram for malignant neoplasm of breast: Secondary | ICD-10-CM

## 2019-03-31 ENCOUNTER — Ambulatory Visit
Admission: RE | Admit: 2019-03-31 | Discharge: 2019-03-31 | Disposition: A | Discharge status: Home / Self Care | Payer: Medicare PPO | Source: Ambulatory Visit | Attending: Family Medicine | Admitting: Family Medicine

## 2019-03-31 ENCOUNTER — Other Ambulatory Visit: Payer: Self-pay

## 2019-03-31 DIAGNOSIS — Z1231 Encounter for screening mammogram for malignant neoplasm of breast: Secondary | ICD-10-CM

## 2019-03-31 IMAGING — MG DIGITAL SCREENING BILATERAL MAMMOGRAM WITH TOMO AND CAD
8 series · 8 of 24 positions shown · non-contrast
Comparison: Previous exam(s).

CLINICAL DATA: Screening.

EXAM:
DIGITAL SCREENING BILATERAL MAMMOGRAM WITH TOMO AND CAD

[L MLO synth-2D]
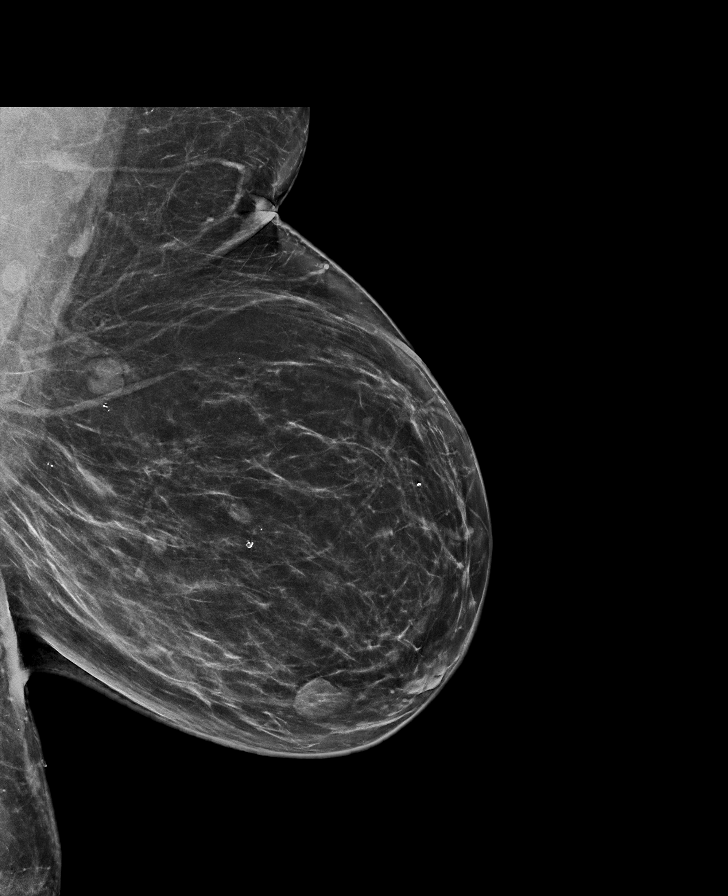

[L CC synth-2D]
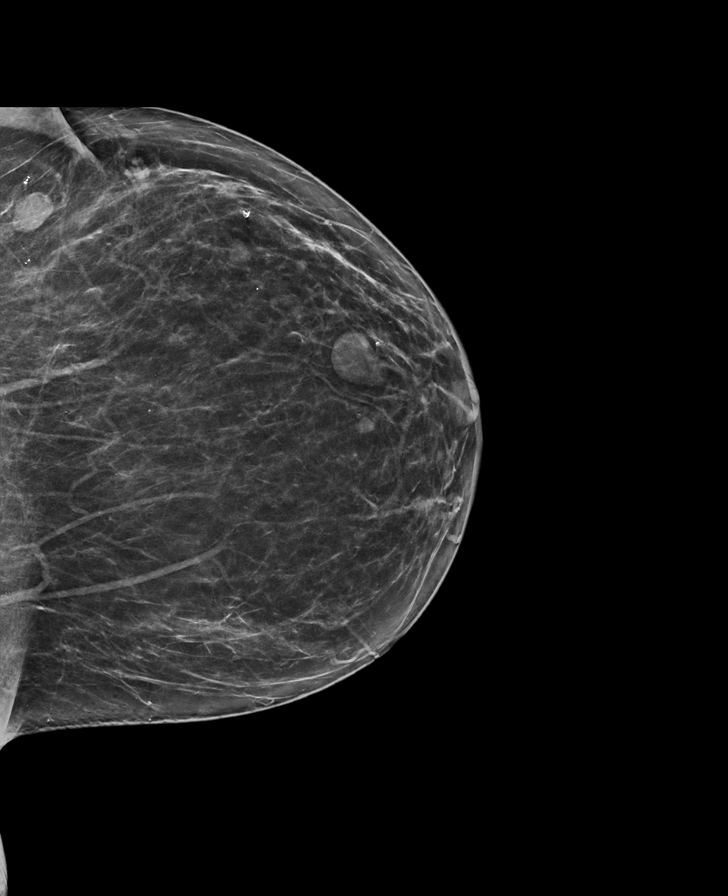

[R CC synth-2D]
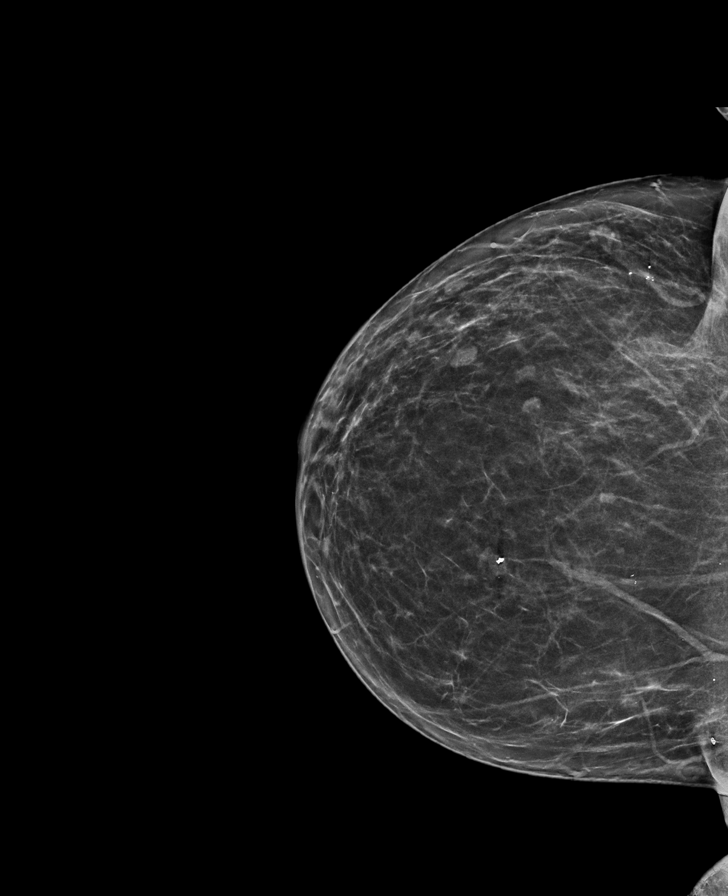

[R MLO synth-2D]
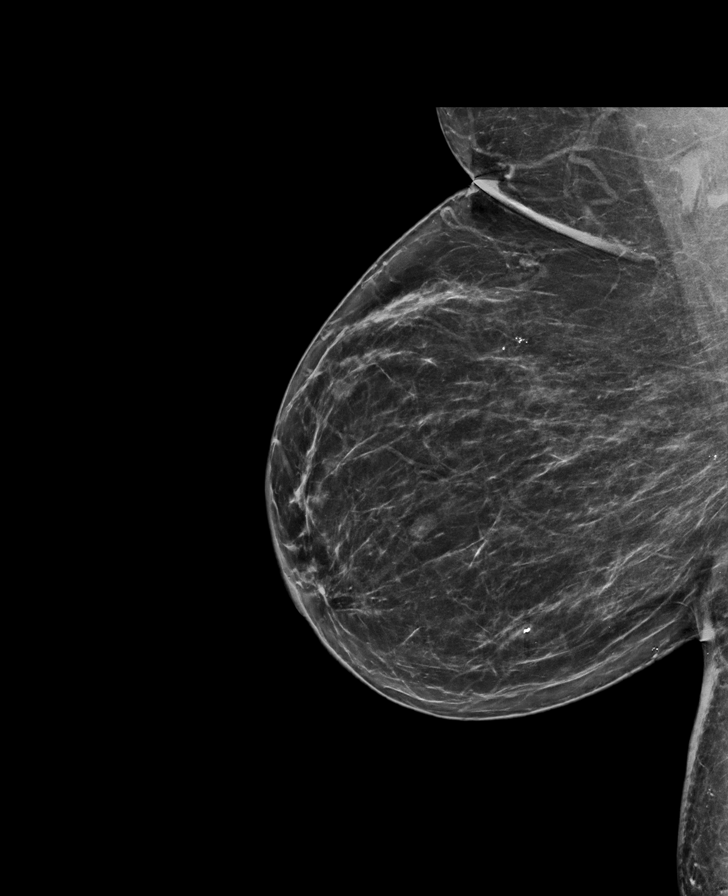

[L MLO tomo · tomo slice 41/81.0]
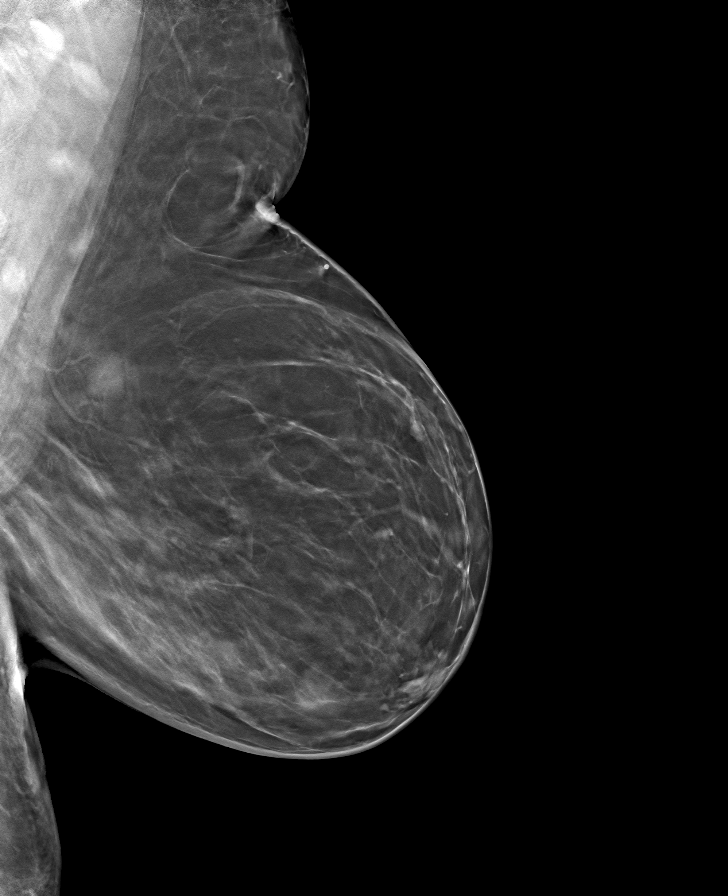

[R MLO tomo · tomo slice 35/70.0]
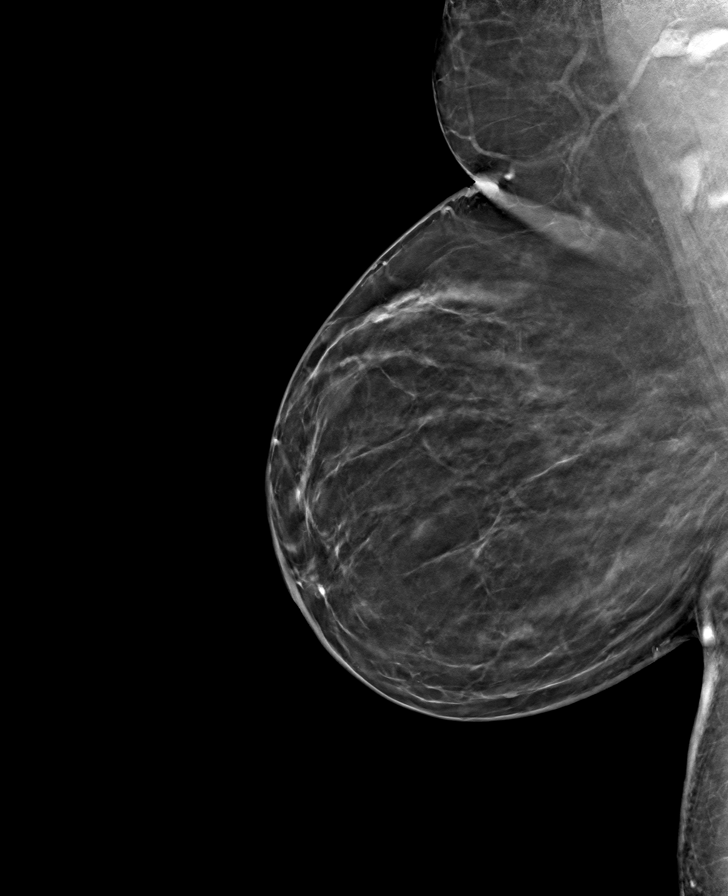

[L CC tomo · tomo slice 34/67.0]
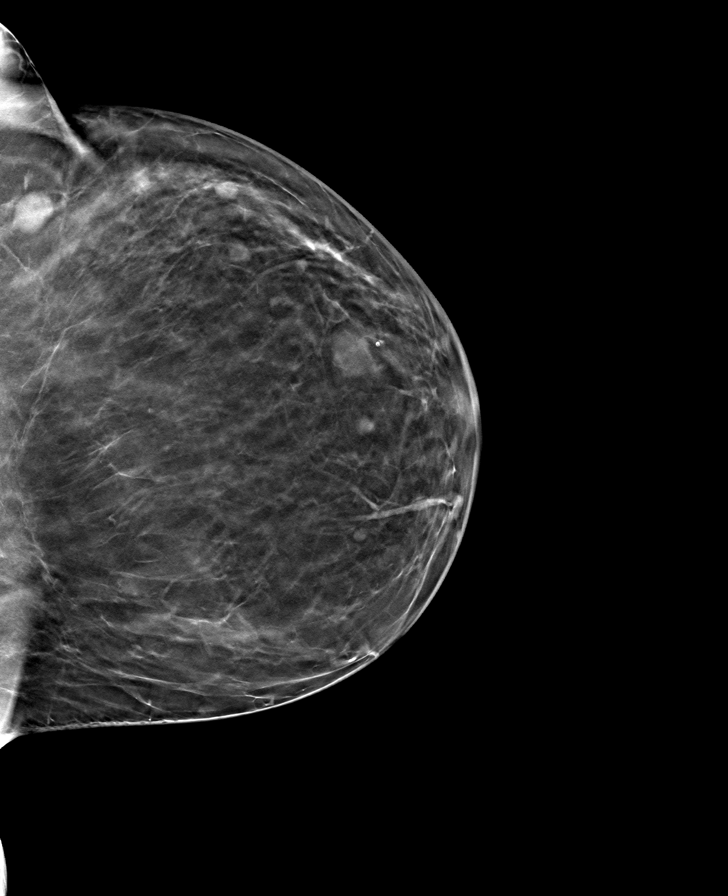

[R CC tomo · tomo slice 31/61.0]
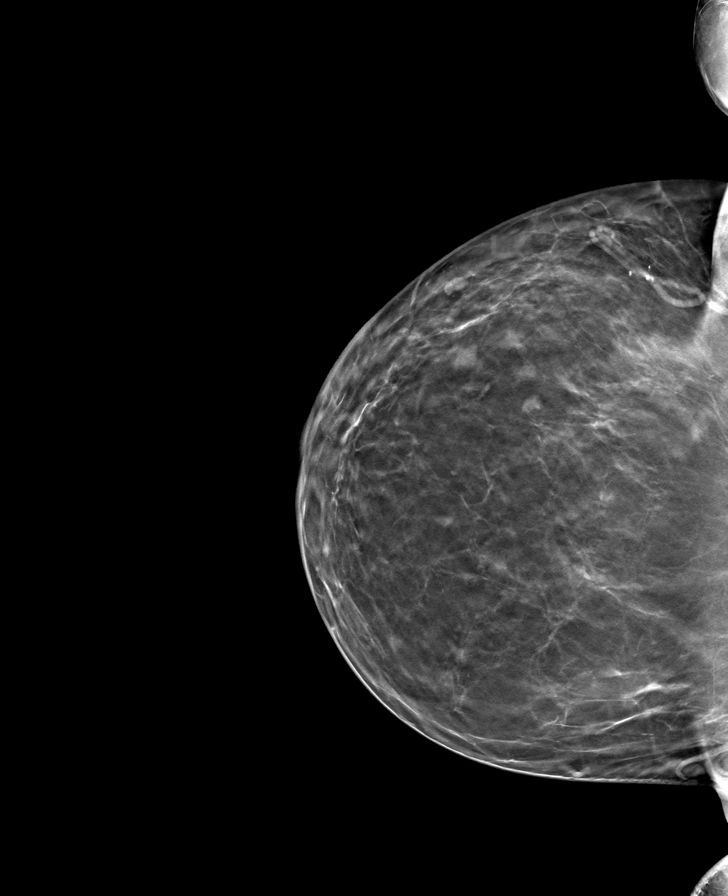

[8 of 24 positions shown; findings below may reference images not displayed]

ACR Breast Density Category b: There are scattered areas of
fibroglandular density.
FINDINGS: In the left breast, a possible mass warrants further evaluation. In
the right breast, no findings suspicious for malignancy.

Images were processed with CAD.
IMPRESSION: Further evaluation is suggested for possible mass in the left
breast.

RECOMMENDATION:
Diagnostic mammogram and possibly ultrasound of the left breast.
(Code:[R1])

The patient will be contacted regarding the findings, and additional
imaging will be scheduled.

BI-RADS CATEGORY  0: Incomplete. Need additional imaging evaluation
and/or prior mammograms for comparison.

## 2019-04-21 ENCOUNTER — Other Ambulatory Visit: Payer: Self-pay | Admitting: Family Medicine

## 2019-04-21 DIAGNOSIS — R928 Other abnormal and inconclusive findings on diagnostic imaging of breast: Secondary | ICD-10-CM

## 2019-04-24 ENCOUNTER — Other Ambulatory Visit: Payer: Self-pay

## 2019-04-24 ENCOUNTER — Ambulatory Visit
Admission: RE | Admit: 2019-04-24 | Discharge: 2019-04-24 | Disposition: A | Discharge status: Home / Self Care | Payer: Medicare PPO | Source: Ambulatory Visit | Attending: Family Medicine | Admitting: Family Medicine

## 2019-04-24 DIAGNOSIS — R928 Other abnormal and inconclusive findings on diagnostic imaging of breast: Secondary | ICD-10-CM

## 2019-04-24 IMAGING — US ULTRASOUND LEFT BREAST LIMITED
1 series · 6 of 6 positions shown · non-contrast
Comparison: Previous exam(s).

CLINICAL DATA: 58-year-old female recalled from screening mammogram
dated [DATE] for a possible left breast mass.

EXAM:
DIGITAL DIAGNOSTIC LEFT MAMMOGRAM WITH CAD AND TOMO
ULTRASOUND LEFT BREAST

[Series 1: ultrasound left breast limited · 0.05mm/px · 6 of 6 slices shown]
[im 1/6]
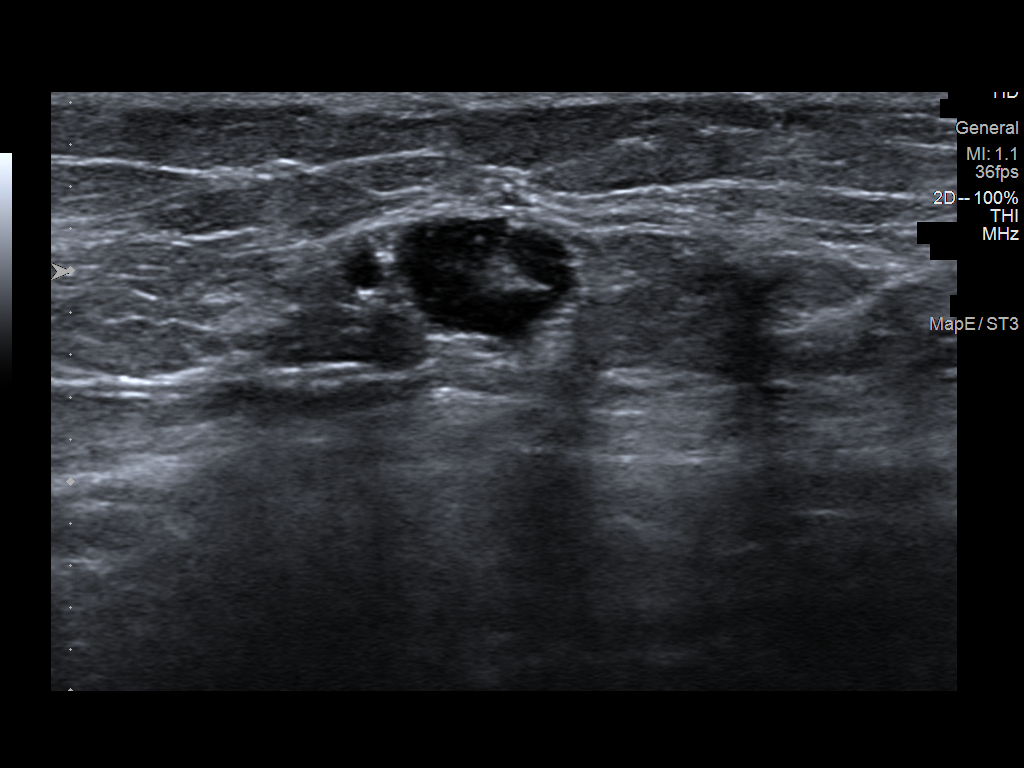
[im 2/6]
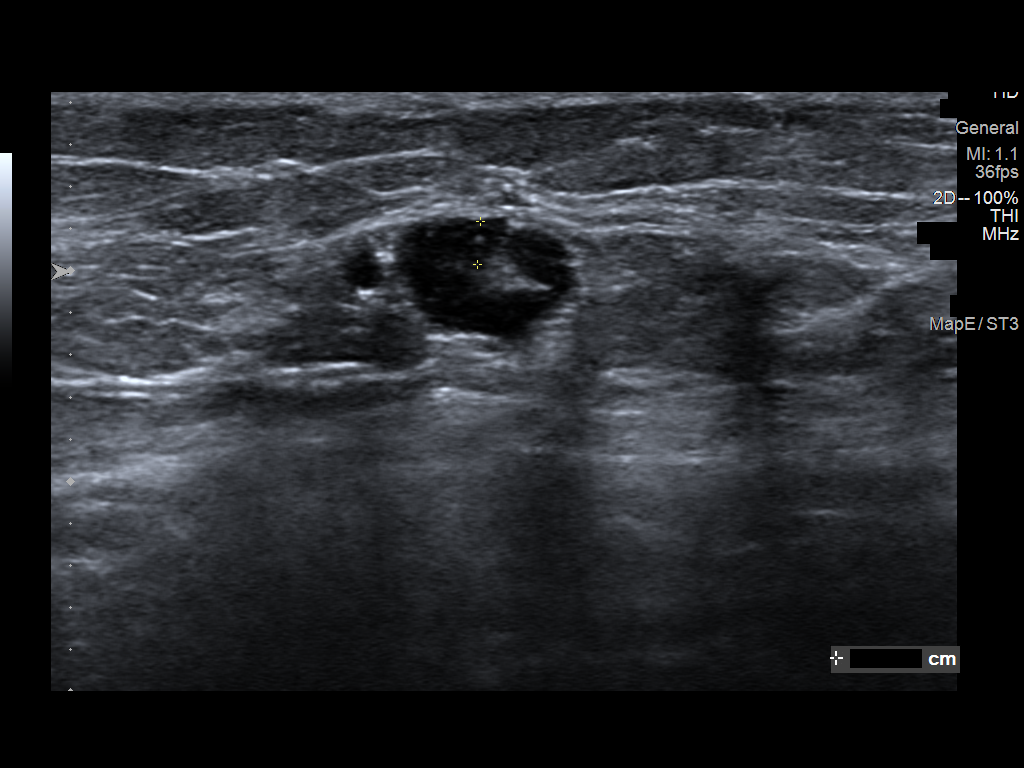
[im 3/6]
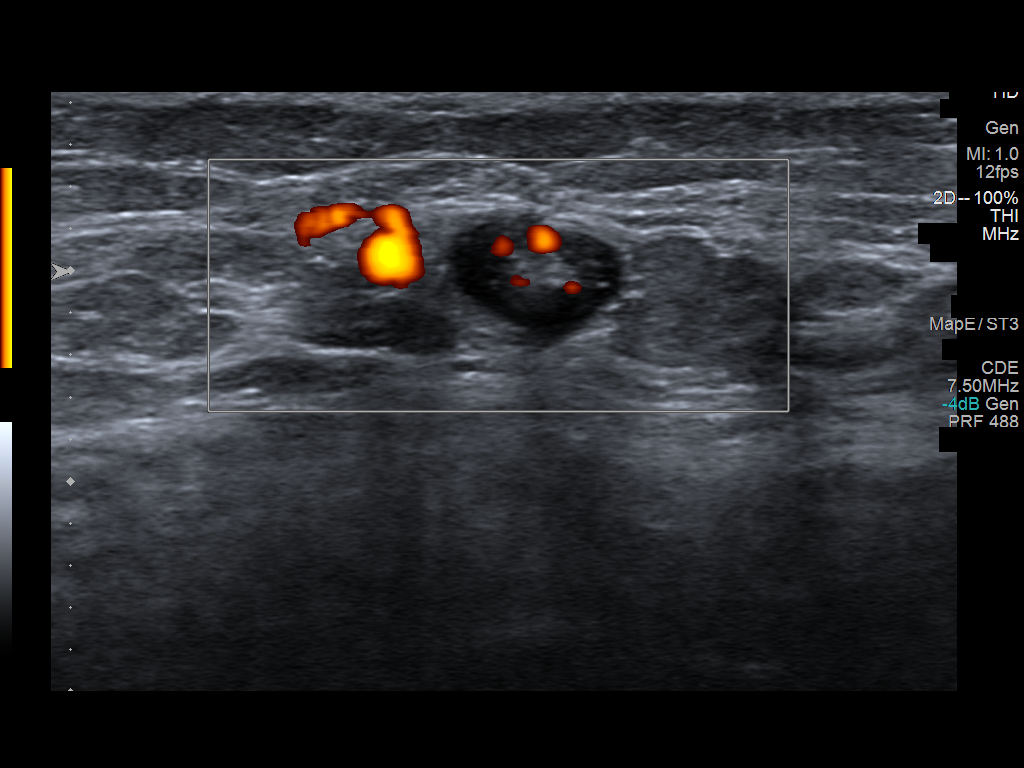
[im 4/6]
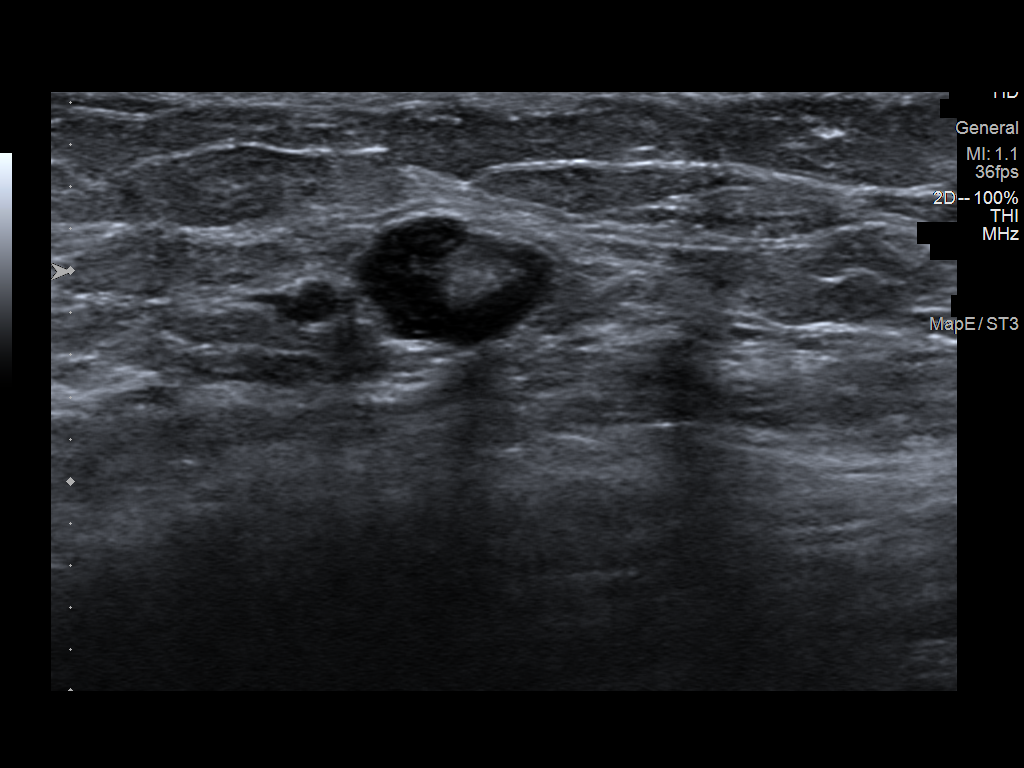
[im 5/6]
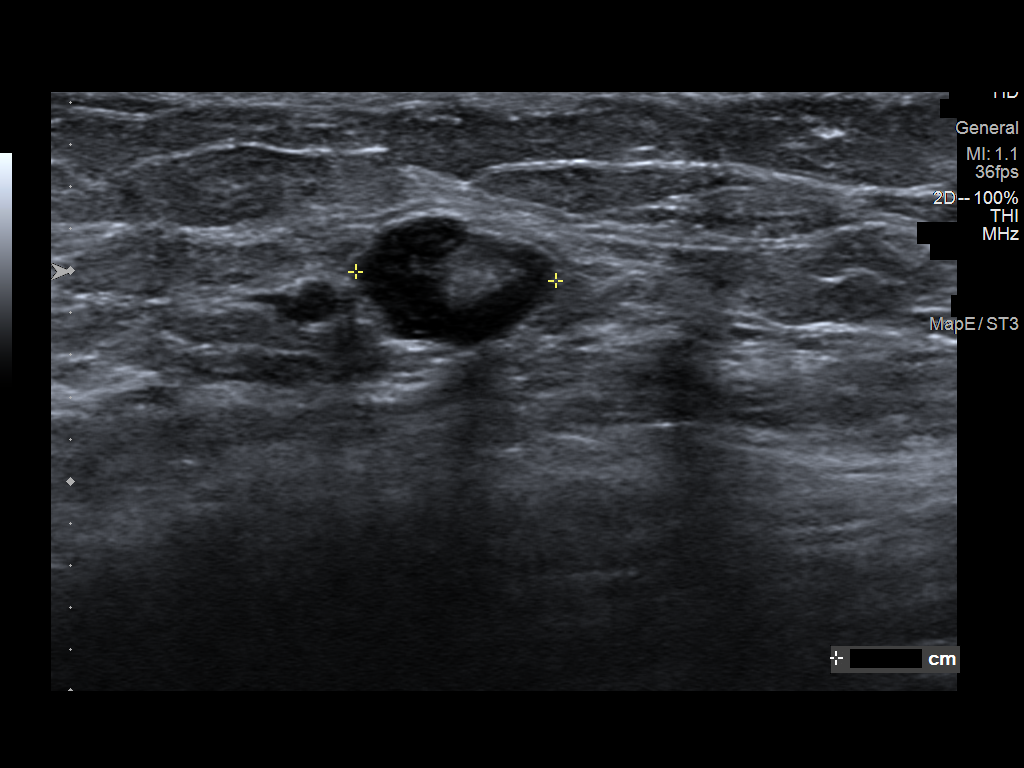
[im 6/6]
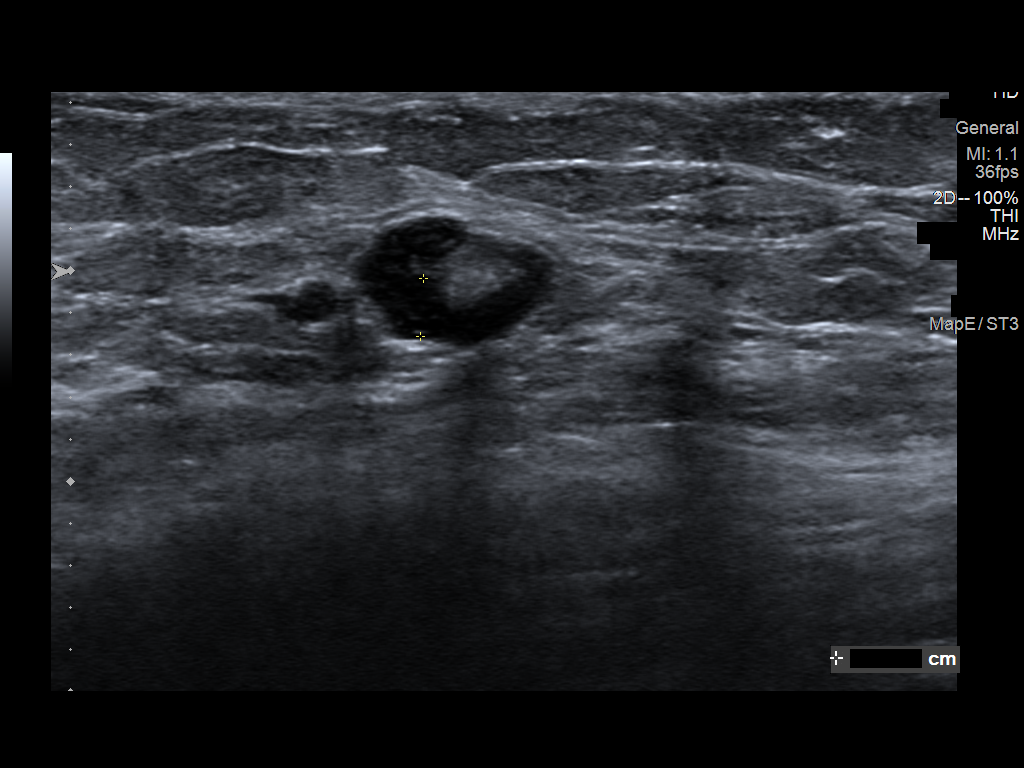

[6 of 6 positions shown; findings below may reference images not displayed]

ACR Breast Density Category b: There are scattered areas of
fibroglandular density.
FINDINGS: Persistent, lobulated mass in the upper outer left breast at
posterior depth has the appearance of an low lying axillary lymph
node. Confirmatory ultrasound was performed.

Mammographic images were processed with CAD.

Targeted ultrasound is performed, showing a morphologically normal
lymph node at the 2 o'clock position 12 cm from the nipple. This
correlates well with the mammographic finding. It demonstrates no
suspicious features.
IMPRESSION: Benign intramammary lymph node corresponding with the screening
mammographic findings. No further imaging follow-up required.

RECOMMENDATION:
Screening mammogram in one year.(Code:[AD])

I have discussed the findings and recommendations with the patient.
Results were also provided in writing at the conclusion of the
visit. If applicable, a reminder letter will be sent to the patient
regarding the next appointment.

BI-RADS CATEGORY  2: Benign.

## 2019-04-24 IMAGING — MG DIGITAL DIAGNOSTIC UNILATERAL LEFT MAMMOGRAM WITH TOMO AND CAD
4 series · 4 of 12 positions shown · non-contrast
Comparison: Previous exam(s).

CLINICAL DATA: 58-year-old female recalled from screening mammogram
dated [DATE] for a possible left breast mass.

EXAM:
DIGITAL DIAGNOSTIC LEFT MAMMOGRAM WITH CAD AND TOMO
ULTRASOUND LEFT BREAST

[L MLO synth-2D]
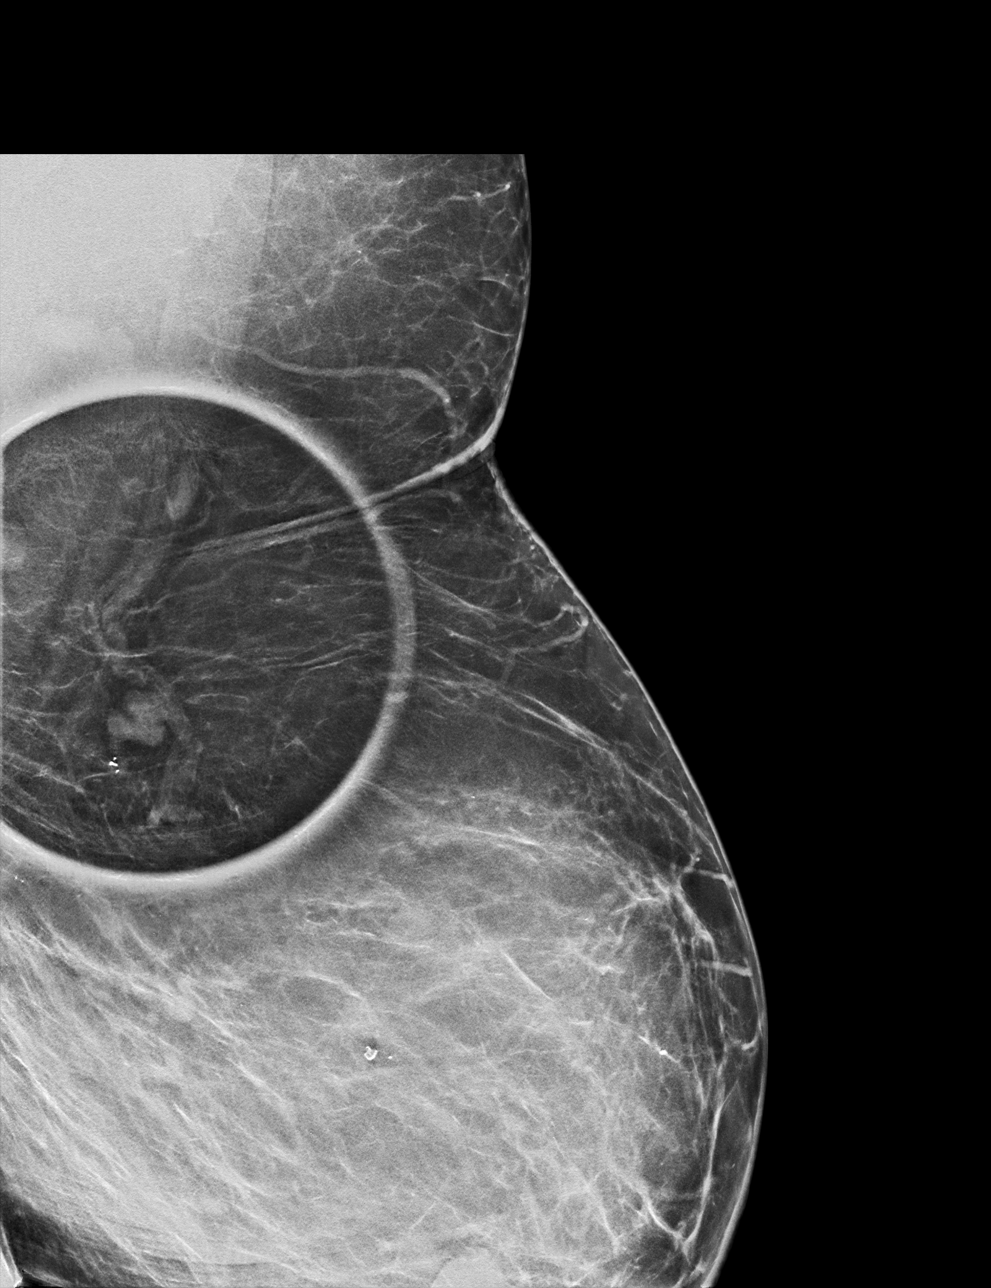

[L CC synth-2D]
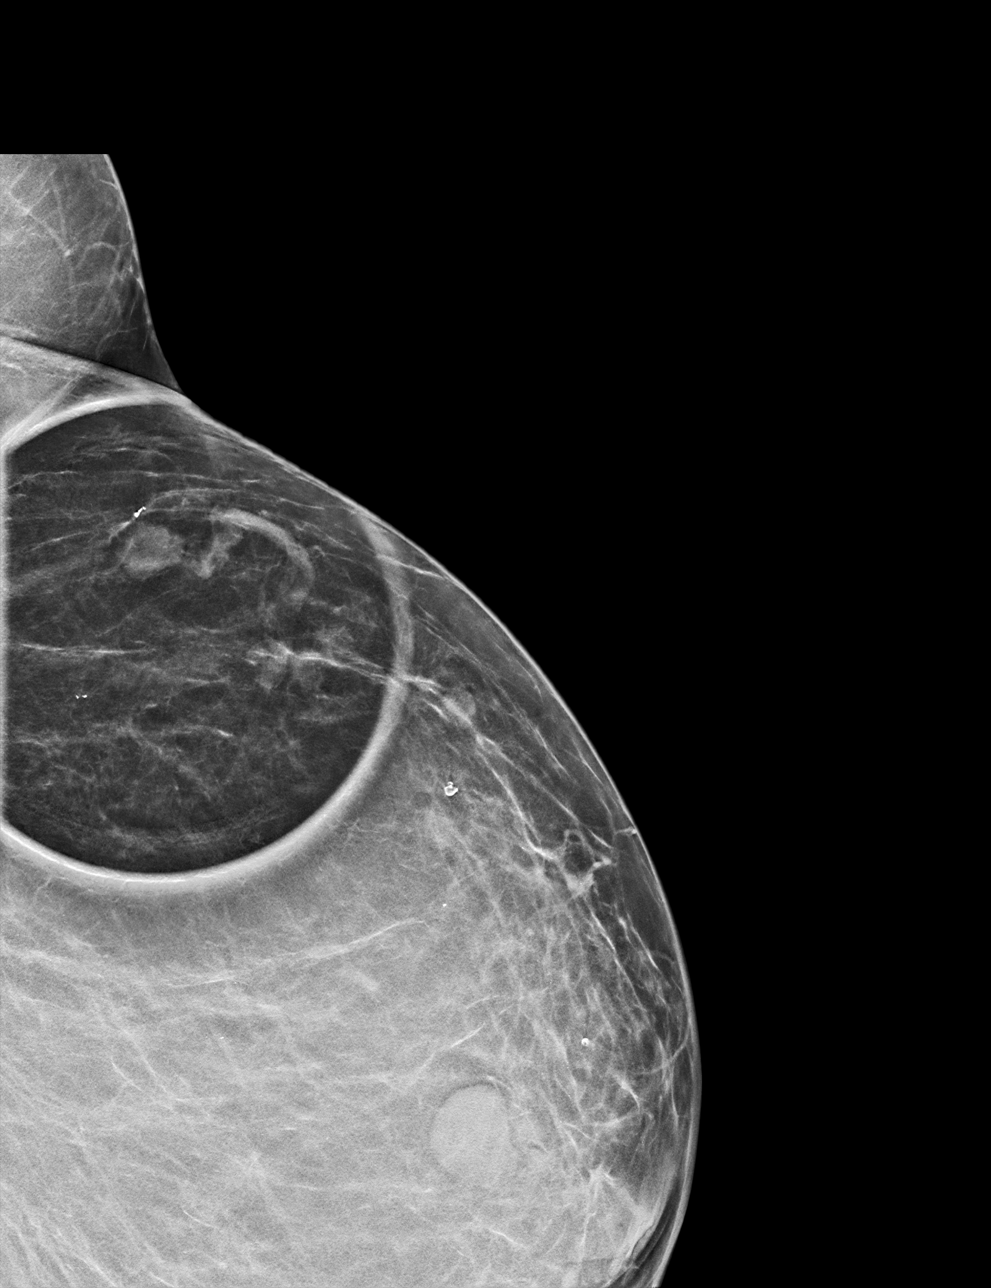

[L CC tomo · tomo slice 27/53.0]
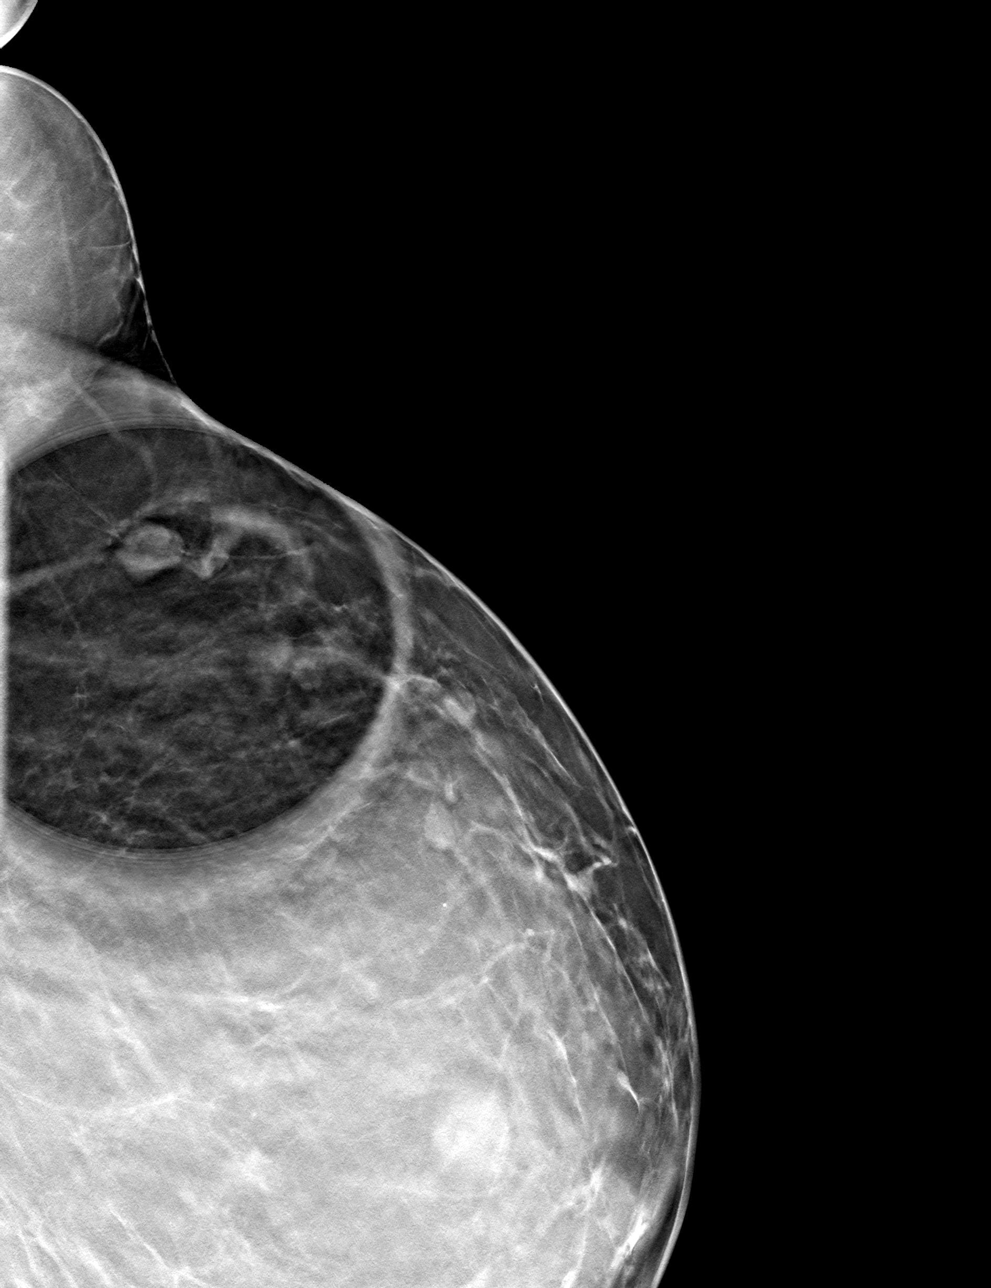

[L MLO tomo · tomo slice 33/64.0]
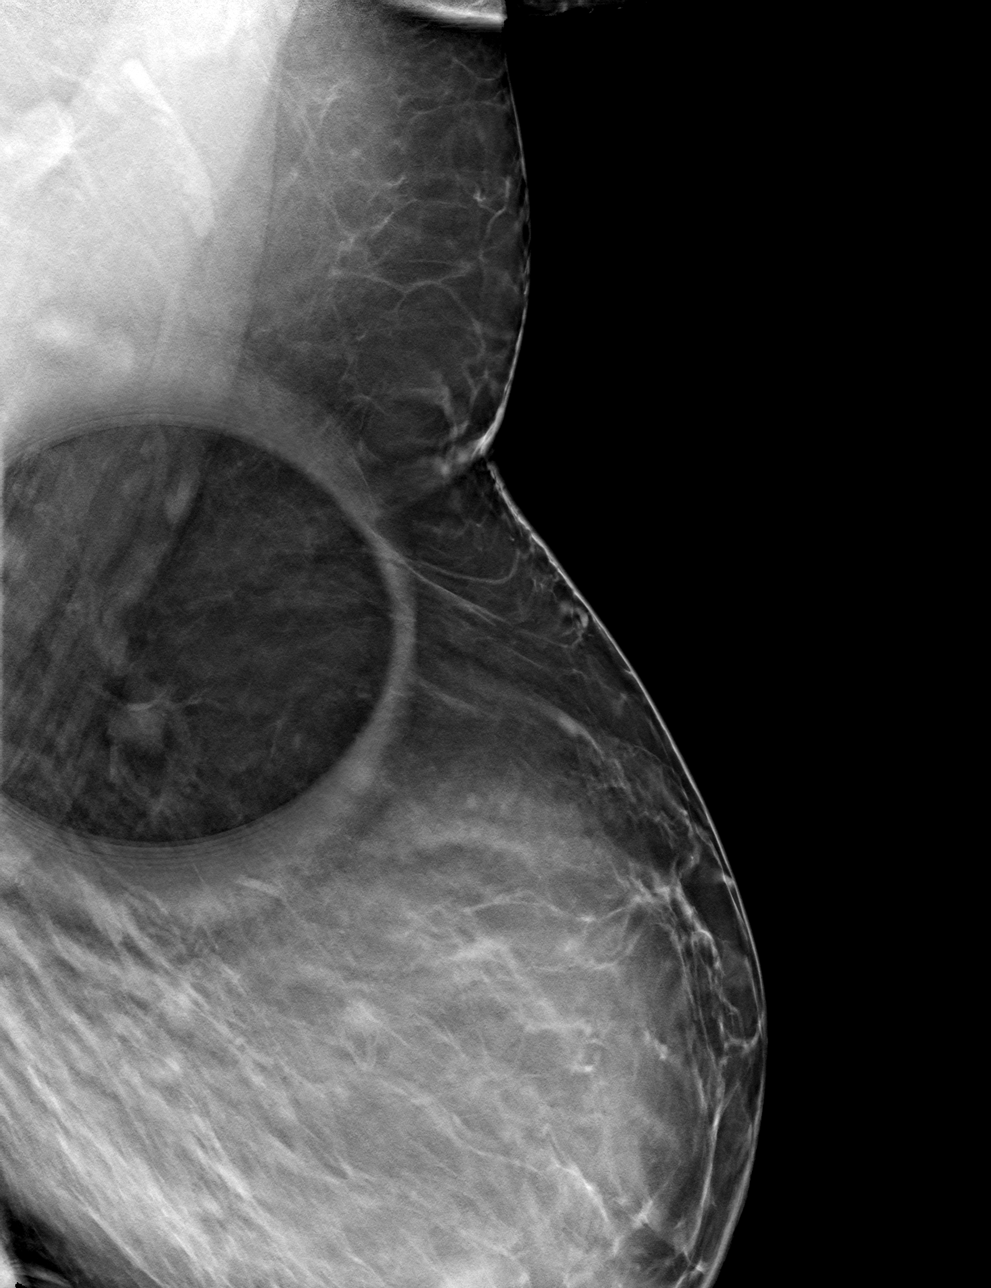

[4 of 12 positions shown; findings below may reference images not displayed]

ACR Breast Density Category b: There are scattered areas of
fibroglandular density.
FINDINGS: Persistent, lobulated mass in the upper outer left breast at
posterior depth has the appearance of an low lying axillary lymph
node. Confirmatory ultrasound was performed.

Mammographic images were processed with CAD.

Targeted ultrasound is performed, showing a morphologically normal
lymph node at the 2 o'clock position 12 cm from the nipple. This
correlates well with the mammographic finding. It demonstrates no
suspicious features.
IMPRESSION: Benign intramammary lymph node corresponding with the screening
mammographic findings. No further imaging follow-up required.

RECOMMENDATION:
Screening mammogram in one year.(Code:[AD])

I have discussed the findings and recommendations with the patient.
Results were also provided in writing at the conclusion of the
visit. If applicable, a reminder letter will be sent to the patient
regarding the next appointment.

BI-RADS CATEGORY  2: Benign.

## 2020-06-28 ENCOUNTER — Other Ambulatory Visit: Payer: Self-pay | Admitting: Family Medicine

## 2020-06-28 DIAGNOSIS — Z1231 Encounter for screening mammogram for malignant neoplasm of breast: Secondary | ICD-10-CM

## 2020-07-12 ENCOUNTER — Emergency Department (HOSPITAL_COMMUNITY): Payer: Medicare PPO

## 2020-07-12 ENCOUNTER — Encounter (HOSPITAL_COMMUNITY): Payer: Self-pay | Admitting: Emergency Medicine

## 2020-07-12 ENCOUNTER — Inpatient Hospital Stay (HOSPITAL_COMMUNITY)
Admission: EM | Admit: 2020-07-12 | Discharge: 2020-07-19 | DRG: 065 | Disposition: A | Discharge status: Rehabilitation Facility | Payer: Medicare PPO | Attending: Internal Medicine | Admitting: Internal Medicine

## 2020-07-12 DIAGNOSIS — Z79899 Other long term (current) drug therapy: Secondary | ICD-10-CM

## 2020-07-12 DIAGNOSIS — F259 Schizoaffective disorder, unspecified: Secondary | ICD-10-CM | POA: Diagnosis present

## 2020-07-12 DIAGNOSIS — E1165 Type 2 diabetes mellitus with hyperglycemia: Secondary | ICD-10-CM | POA: Diagnosis present

## 2020-07-12 DIAGNOSIS — I6381 Other cerebral infarction due to occlusion or stenosis of small artery: Principal | ICD-10-CM | POA: Diagnosis present

## 2020-07-12 DIAGNOSIS — E1151 Type 2 diabetes mellitus with diabetic peripheral angiopathy without gangrene: Secondary | ICD-10-CM | POA: Diagnosis present

## 2020-07-12 DIAGNOSIS — I639 Cerebral infarction, unspecified: Secondary | ICD-10-CM

## 2020-07-12 DIAGNOSIS — D751 Secondary polycythemia: Secondary | ICD-10-CM | POA: Diagnosis present

## 2020-07-12 DIAGNOSIS — Z20822 Contact with and (suspected) exposure to covid-19: Secondary | ICD-10-CM | POA: Diagnosis present

## 2020-07-12 DIAGNOSIS — E119 Type 2 diabetes mellitus without complications: Secondary | ICD-10-CM

## 2020-07-12 DIAGNOSIS — Z87891 Personal history of nicotine dependence: Secondary | ICD-10-CM

## 2020-07-12 DIAGNOSIS — I10 Essential (primary) hypertension: Secondary | ICD-10-CM | POA: Diagnosis present

## 2020-07-12 DIAGNOSIS — G4733 Obstructive sleep apnea (adult) (pediatric): Secondary | ICD-10-CM | POA: Diagnosis present

## 2020-07-12 DIAGNOSIS — K760 Fatty (change of) liver, not elsewhere classified: Secondary | ICD-10-CM | POA: Diagnosis present

## 2020-07-12 DIAGNOSIS — Z6841 Body Mass Index (BMI) 40.0 and over, adult: Secondary | ICD-10-CM

## 2020-07-12 DIAGNOSIS — E782 Mixed hyperlipidemia: Secondary | ICD-10-CM | POA: Diagnosis present

## 2020-07-12 DIAGNOSIS — F4310 Post-traumatic stress disorder, unspecified: Secondary | ICD-10-CM | POA: Diagnosis present

## 2020-07-12 DIAGNOSIS — E039 Hypothyroidism, unspecified: Secondary | ICD-10-CM | POA: Diagnosis present

## 2020-07-12 DIAGNOSIS — G8191 Hemiplegia, unspecified affecting right dominant side: Secondary | ICD-10-CM | POA: Diagnosis present

## 2020-07-12 HISTORY — DX: Cerebral infarction, unspecified: I63.9

## 2020-07-12 LAB — CBC
HCT: 42.8 % (ref 36.0–46.0)
Hemoglobin: 13.8 g/dL (ref 12.0–15.0)
MCH: 30.4 pg (ref 26.0–34.0)
MCHC: 32.2 g/dL (ref 30.0–36.0)
MCV: 94.3 fL (ref 80.0–100.0)
Platelets: 162 10*3/uL (ref 150–400)
RBC: 4.54 MIL/uL (ref 3.87–5.11)
RDW: 11.9 % (ref 11.5–15.5)
WBC: 5.7 10*3/uL (ref 4.0–10.5)
nRBC: 0 % (ref 0.0–0.2)

## 2020-07-12 LAB — COMPREHENSIVE METABOLIC PANEL
ALT: 47 U/L — ABNORMAL HIGH (ref 0–44)
AST: 30 U/L (ref 15–41)
Albumin: 3.3 g/dL — ABNORMAL LOW (ref 3.5–5.0)
Alkaline Phosphatase: 76 U/L (ref 38–126)
Anion gap: 10 (ref 5–15)
BUN: 17 mg/dL (ref 6–20)
CO2: 21 mmol/L — ABNORMAL LOW (ref 22–32)
Calcium: 9.1 mg/dL (ref 8.9–10.3)
Chloride: 101 mmol/L (ref 98–111)
Creatinine, Ser: 0.72 mg/dL (ref 0.44–1.00)
GFR calc Af Amer: >60 mL/min (ref >60)
GFR calc non Af Amer: >60 mL/min (ref >60)
Glucose, Bld: 304 mg/dL — ABNORMAL HIGH (ref 70–99)
Potassium: 4.2 mmol/L (ref 3.5–5.1)
Sodium: 132 mmol/L — ABNORMAL LOW (ref 135–145)
Total Bilirubin: 0.5 mg/dL (ref 0.3–1.2)
Total Protein: 6.1 g/dL — ABNORMAL LOW (ref 6.5–8.1)

## 2020-07-12 LAB — DIFFERENTIAL
Abs Immature Granulocytes: 0.04 10*3/uL (ref 0.00–0.07)
Basophils Absolute: 0 10*3/uL (ref 0.0–0.1)
Basophils Relative: 1 %
Eosinophils Absolute: 0.1 10*3/uL (ref 0.0–0.5)
Eosinophils Relative: 1 %
Immature Granulocytes: 1 %
Lymphocytes Relative: 42 %
Lymphs Abs: 2.4 10*3/uL (ref 0.7–4.0)
Monocytes Absolute: 0.5 10*3/uL (ref 0.1–1.0)
Monocytes Relative: 8 %
Neutro Abs: 2.7 10*3/uL (ref 1.7–7.7)
Neutrophils Relative %: 47 %

## 2020-07-12 LAB — PROTIME-INR
INR: 1 (ref 0.8–1.2)
Prothrombin Time: 13.1 seconds (ref 11.4–15.2)

## 2020-07-12 LAB — APTT: aPTT: 26 seconds (ref 24–36)

## 2020-07-12 LAB — CBG MONITORING, ED: Glucose-Capillary: 266 mg/dL — ABNORMAL HIGH (ref 70–99)

## 2020-07-12 LAB — I-STAT BETA HCG BLOOD, ED (MC, WL, AP ONLY): I-stat hCG, quantitative: <5 m[IU]/mL (ref <5)

## 2020-07-12 LAB — ETHANOL: Alcohol, Ethyl (B): <10 mg/dL (ref <10)

## 2020-07-12 IMAGING — CT CT HEAD CODE STROKE
4 series · 17 of 47 positions shown, 19 images · non-contrast
Comparison: None.

CLINICAL DATA: Code stroke.  Right-sided weakness.

EXAM:
CT HEAD WITHOUT CONTRAST
TECHNIQUE: Contiguous axial images were obtained from the base of the skull
through the vertex without intravenous contrast.

[Series 2: head wo · axial · 0.39mm/px · z∈[+1198,+1322]mm · 7 of 35 slices shown, 9 images]
[im 5/35  brain]
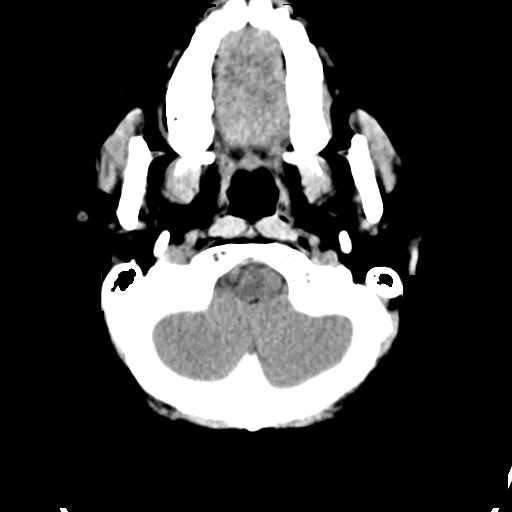
[im 5/35  bone]
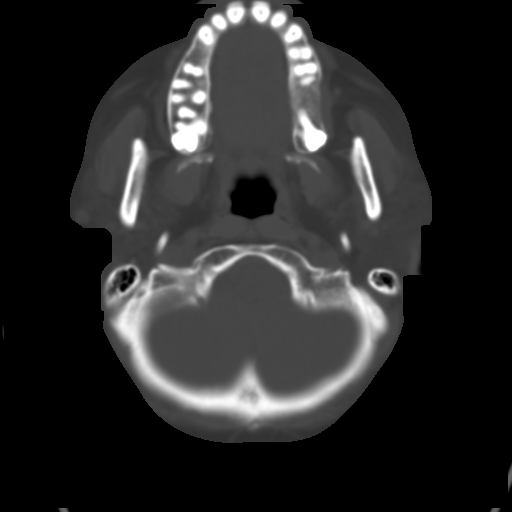
[im 9/35  brain]
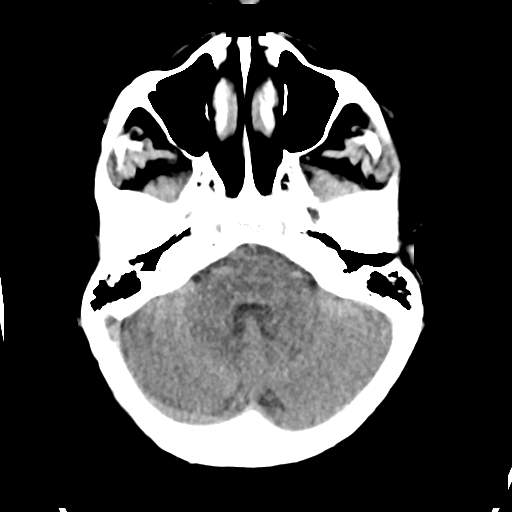
[im 13/35  brain]
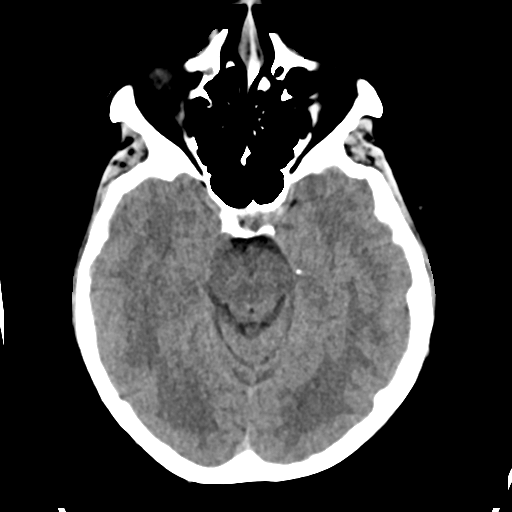
[im 18/35  brain]
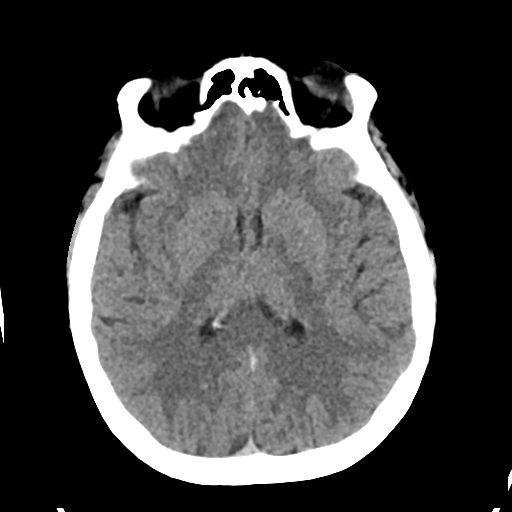
[im 22/35  brain]
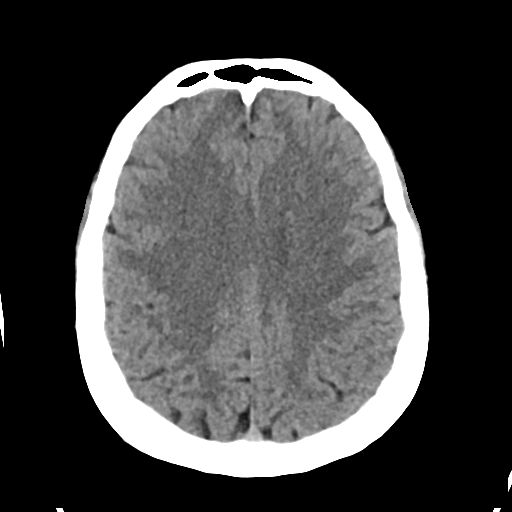
[im 22/35  bone]
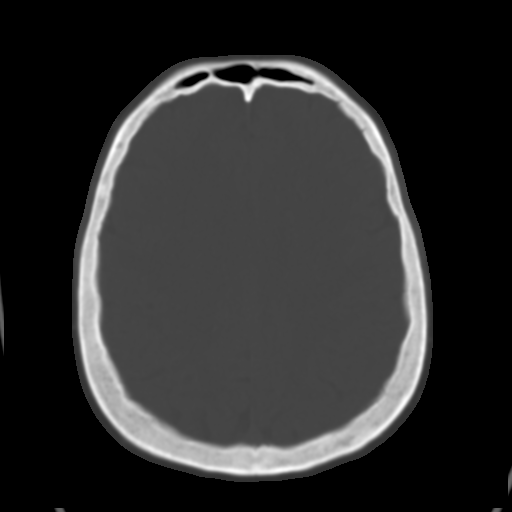
[im 26/35  brain]
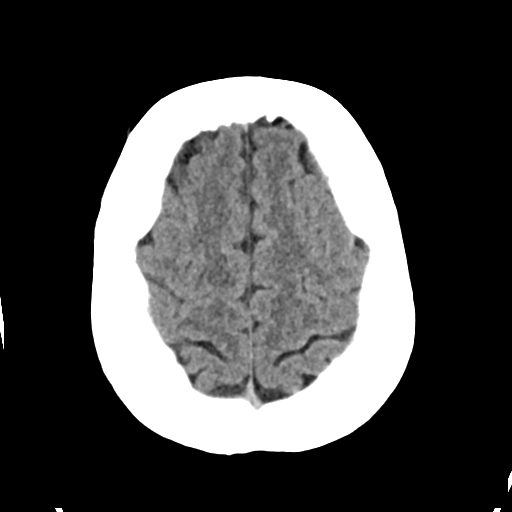
[im 30/35  brain]
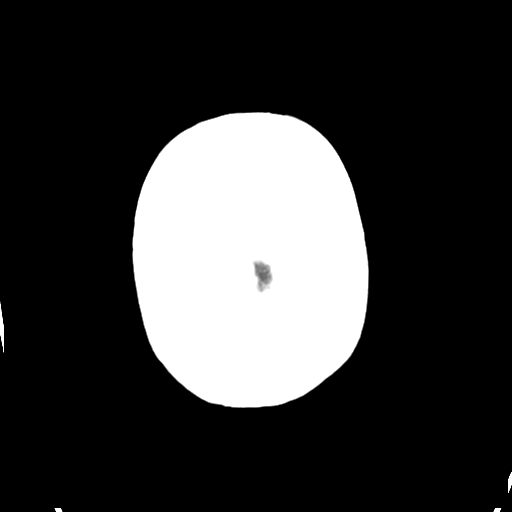

[Series 3: head bone · axial · 0.39mm/px · z∈[+1194,+1254]mm · 4 of 87 slices shown]
[im 9/87  bone]
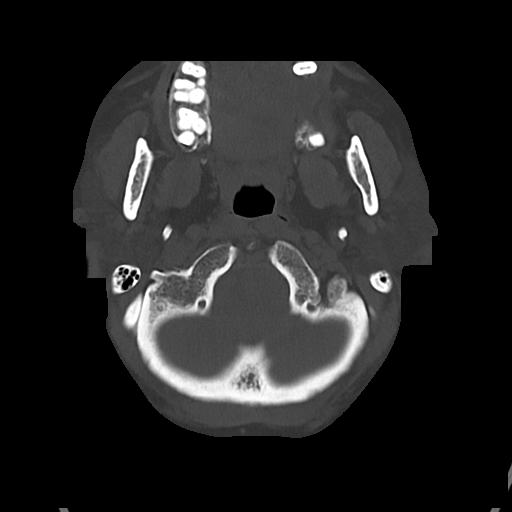
[im 18/87  bone]
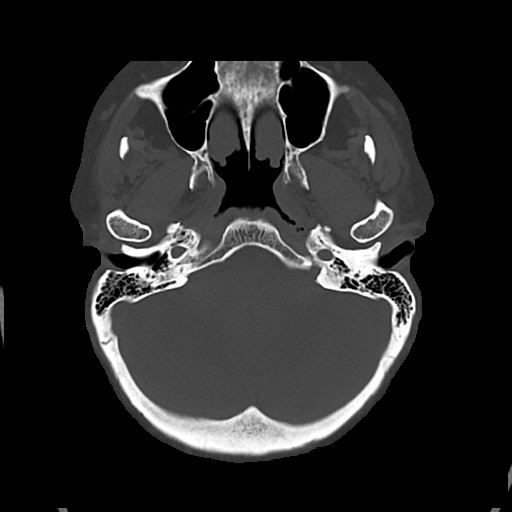
[im 26/87  bone]
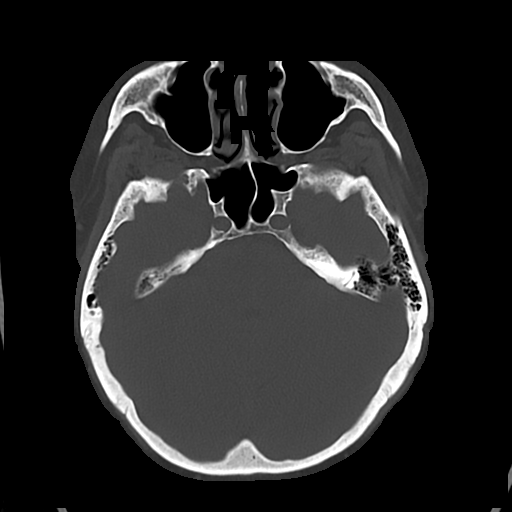
[im 39/87  bone]
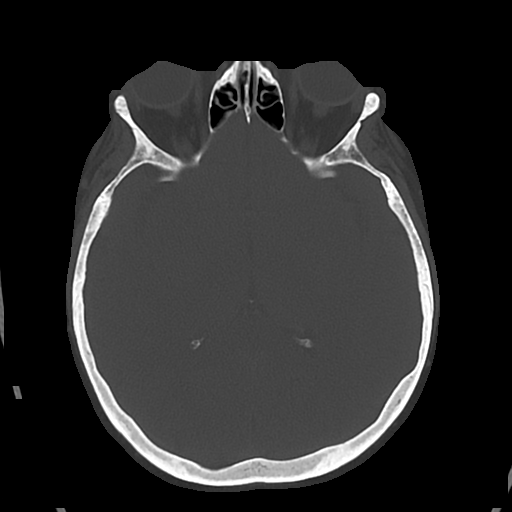

[Series 4: cor soft · coronal · 0.33mm/px · 3 of 67 slices shown]
[im 23/67  brain]
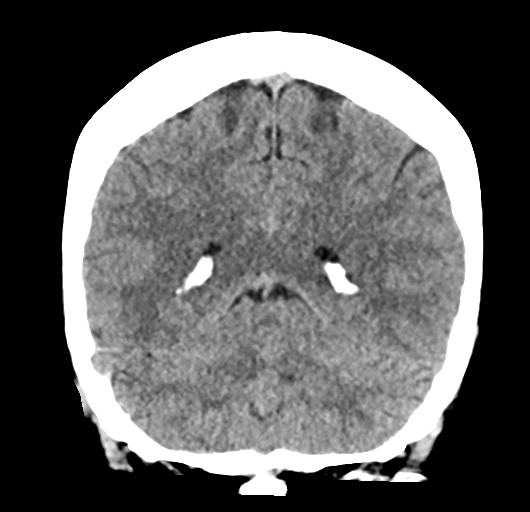
[im 30/67  brain]
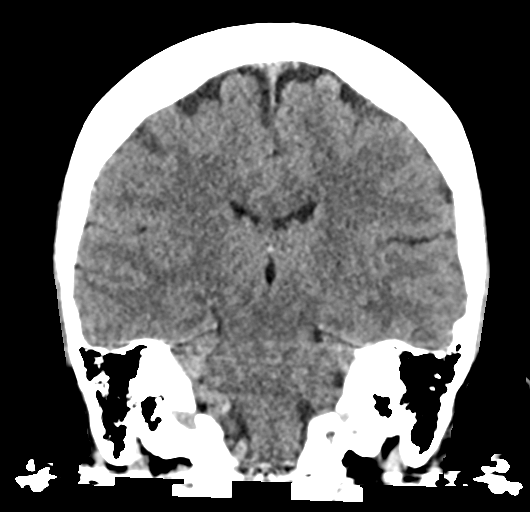
[im 37/67  brain]
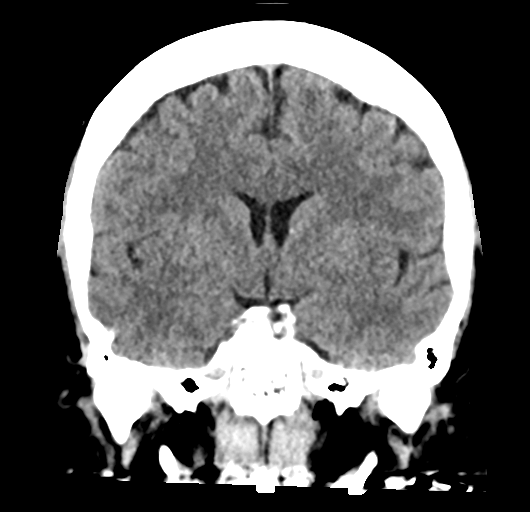

[Series 5: sag soft · sagittal · 0.34mm/px · 3 of 59 slices shown]
[im 20/59  brain]
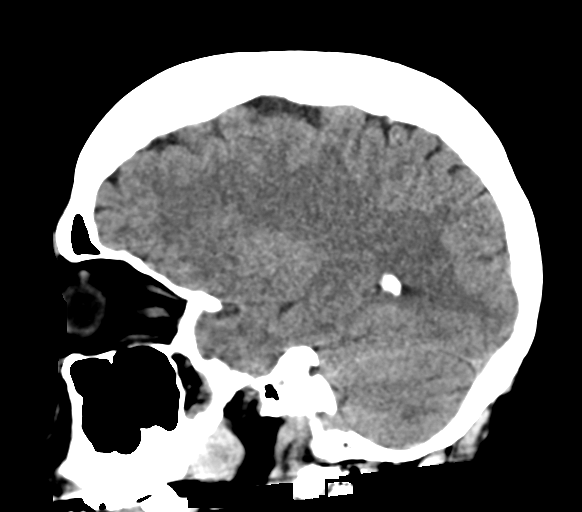
[im 30/59  brain]
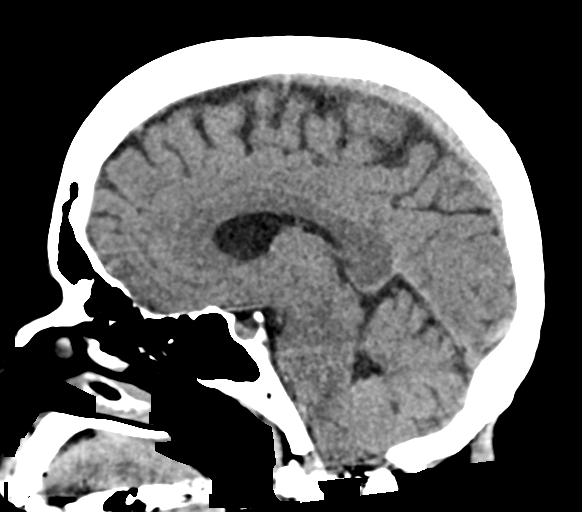
[im 39/59  brain]
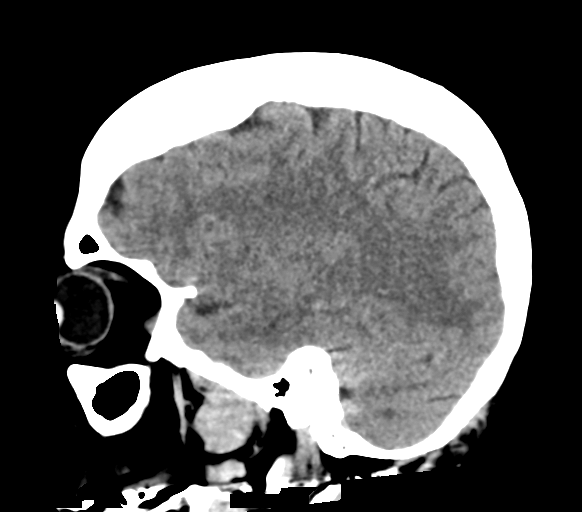

[17 of 47 positions shown; findings below may reference images not displayed]

FINDINGS: Brain: Normal appearance without evidence of atrophy, old or acute
infarction, mass lesion, hemorrhage, hydrocephalus or extra-axial
collection.

Vascular: No abnormal vascular finding.

Skull: Normal

Sinuses/Orbits: Clear/normal

Other: None

ASPECTS (Alberta Stroke Program Early CT Score)

- Ganglionic level infarction (caudate, lentiform nuclei, internal
capsule, insula, M1-M3 cortex): 7

- Supraganglionic infarction (M4-M6 cortex): 3

Total score (0-10 with 10 being normal): 10
IMPRESSION: 1. Normal head CT.
2. ASPECTS is 10.
3. These results were communicated to Dr. SARACUTA at [DATE] pmon
[DATE]by text page via the [employer] messaging system.

## 2020-07-12 IMAGING — MR MR HEAD W/O CM
7 of 11 series · 24 of 48 positions shown · non-contrast
Comparison: CT perfusion [DATE]

CLINICAL DATA: Headache with right-sided weakness

EXAM:
MRI HEAD WITHOUT CONTRAST
TECHNIQUE: Multiplanar, multiecho pulse sequences of the brain and surrounding
structures were obtained without intravenous contrast.

[Series 2: DWI · axial · 3.0mm · 0.94mm/px · z∈[-59,+88]mm · 6 of 100 slices shown (1 of 2)]
[im 1/100]
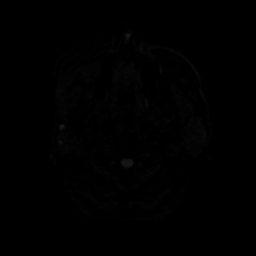
[im 20/100]
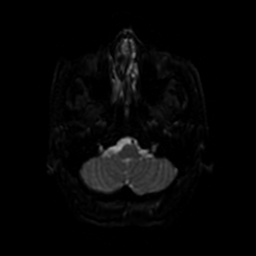
[im 40/100]
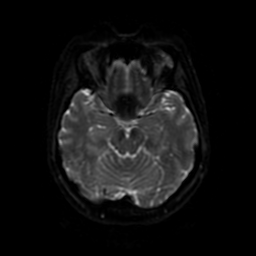
[im 60/100]
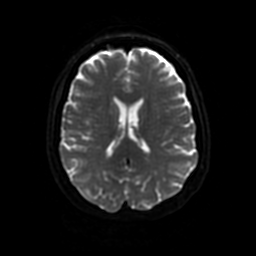
[im 80/100]
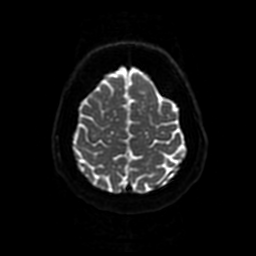
[im 100/100]
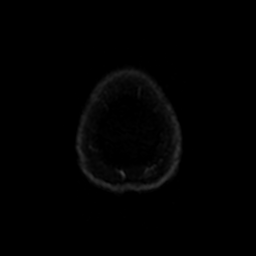

[Series 3: DWI · coronal · 4.0mm · 0.94mm/px · 5 of 70 slices shown (2 of 2)]
[im 1/70]
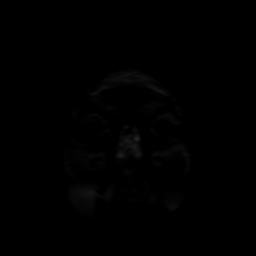
[im 18/70]
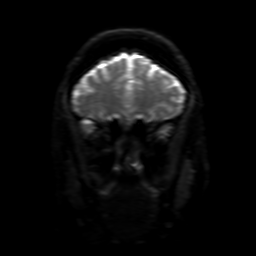
[im 35/70]
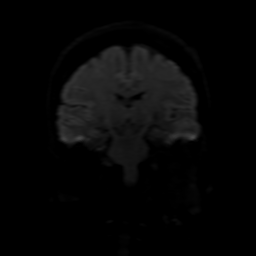
[im 52/70]
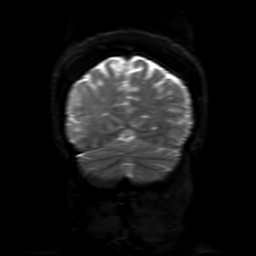
[im 70/70]
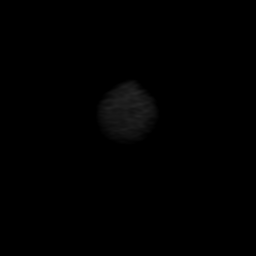

[Series 4: FLAIR · sagittal · 5.0mm · 0.23mm/px · 2 of 25 slices shown (1 of 2)]
[im 1/25]
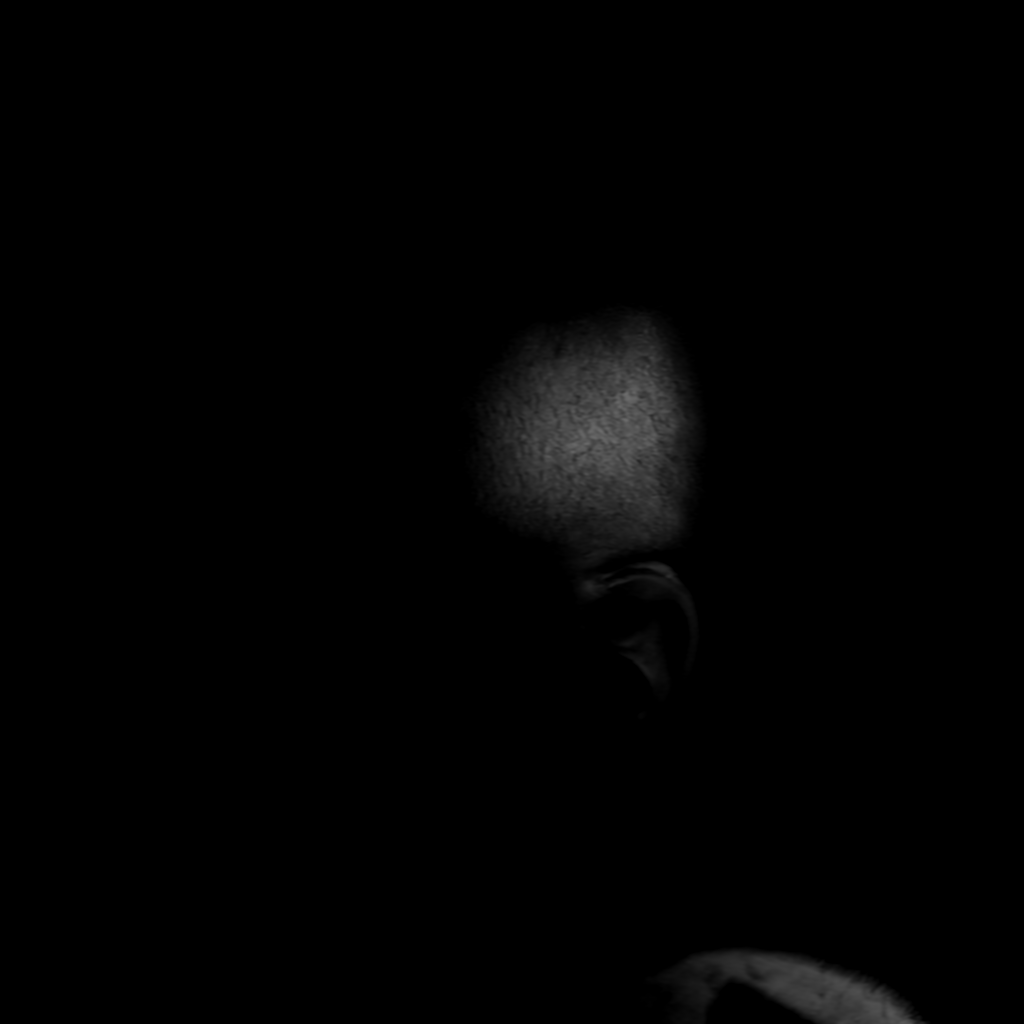
[im 25/25]
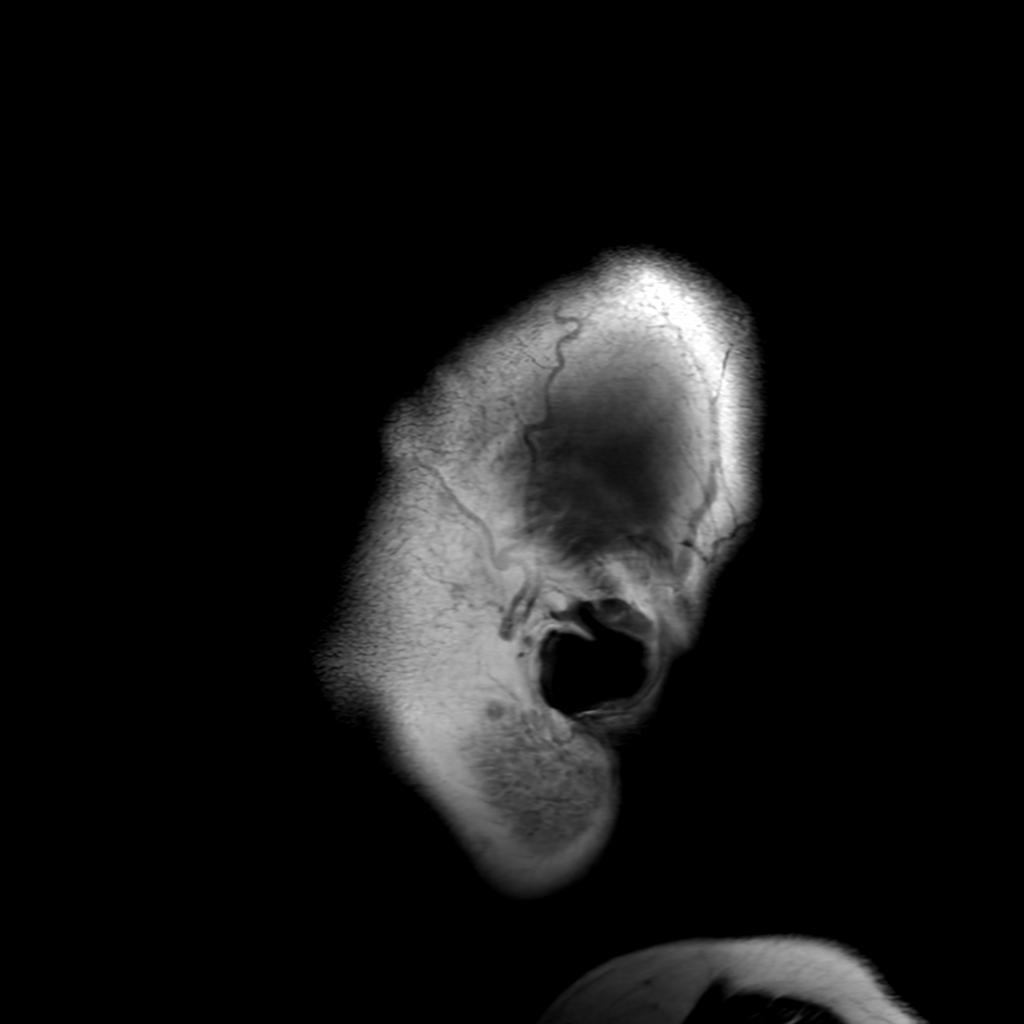

[Series 5: T2 · axial · 5.0mm · 0.43mm/px · z∈[-70,+98]mm · 2 of 29 slices shown]
[im 1/29]
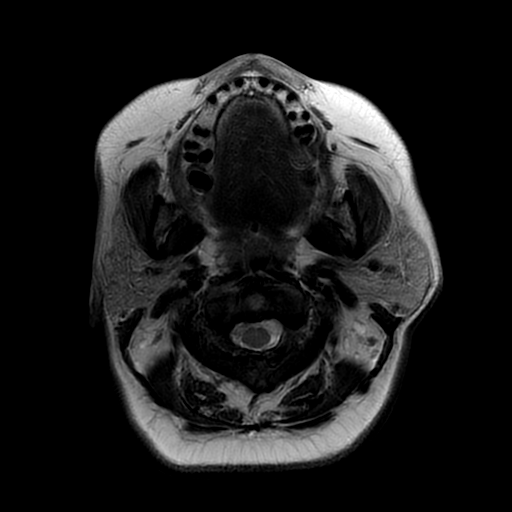
[im 29/29]
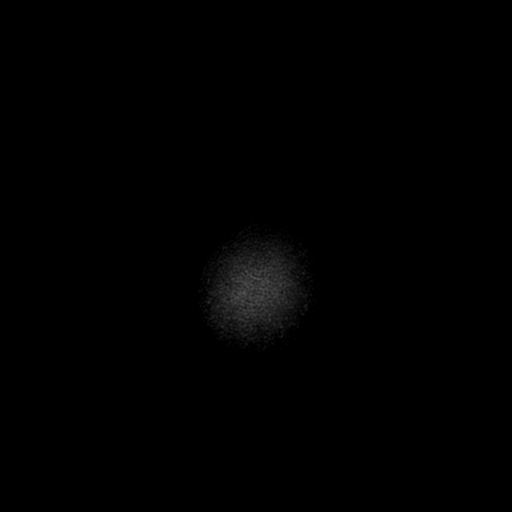

[Series 6: FLAIR · axial · 3.0mm · 0.43mm/px · z∈[-70,+98]mm · 2 of 29 slices shown (2 of 2)]
[im 1/29]
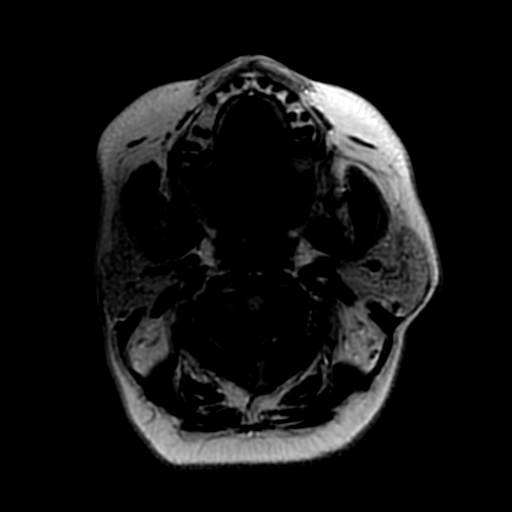
[im 29/29]
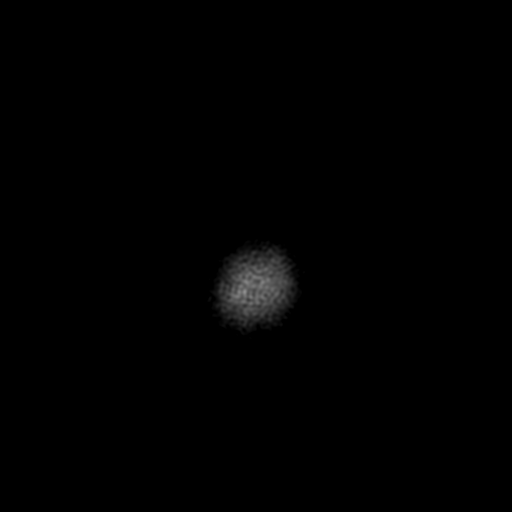

[Series 250: ADC · axial · 3.0mm · 0.94mm/px · z∈[-59,+88]mm · 4 of 50 slices shown (1 of 2)]
[im 1/50]
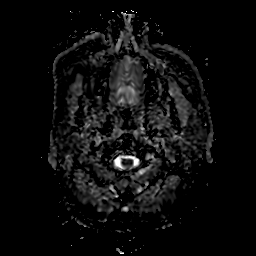
[im 17/50]
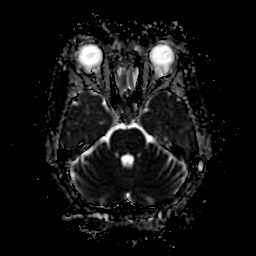
[im 33/50]
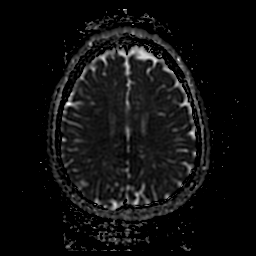
[im 50/50]
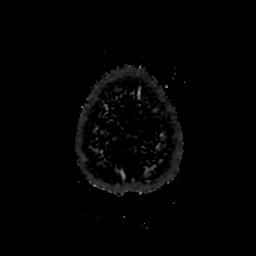

[Series 350: ADC · coronal · 4.0mm · 0.94mm/px · 3 of 35 slices shown (2 of 2)]
[im 1/35]
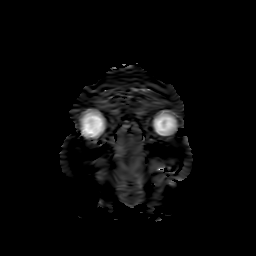
[im 18/35]
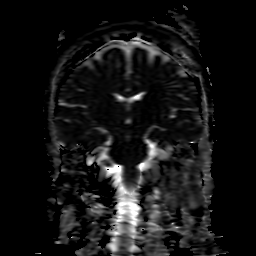
[im 35/35]
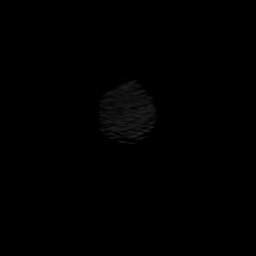

[24 of 48 positions shown; findings below may reference images not displayed]

FINDINGS: Brain: There is a small focus of abnormal diffusion restriction
within the left thalamus. Normal white matter signal. Normal volume
of CSF spaces. Single focus of chronic microhemorrhage in the right
frontal white matter. Normal midline structures.

Vascular: Normal flow voids.

Skull and upper cervical spine: Normal marrow signal.

Sinuses/Orbits: Normal

Other: None
IMPRESSION: Small acute/early subacute infarct within the left thalamus. No
hemorrhage or mass effect.

## 2020-07-12 MED ORDER — INSULIN ASPART 100 UNIT/ML ~~LOC~~ SOLN
0.0000 [IU] | SUBCUTANEOUS | Status: DC
  Administered 2020-07-12: 8 [IU] via SUBCUTANEOUS
  Administered 2020-07-13 (×2): 5 [IU] via SUBCUTANEOUS
  Administered 2020-07-13: 3 [IU] via SUBCUTANEOUS
  Administered 2020-07-13: 8 [IU] via SUBCUTANEOUS
  Administered 2020-07-13: 3 [IU] via SUBCUTANEOUS
  Administered 2020-07-14 (×2): 5 [IU] via SUBCUTANEOUS

## 2020-07-12 MED ORDER — IOHEXOL 350 MG/ML SOLN
100.0000 mL | Freq: Once | INTRAVENOUS | Status: AC | PRN
  Administered 2020-07-12: 100 mL via INTRAVENOUS

## 2020-07-12 NOTE — ED Provider Notes (Signed)
[facility] Provider Note   CSN: [id number] Arrival date & time: 07/12/20  1941  An emergency department physician performed an initial assessment on this suspected stroke patient at 32.  History Chief Complaint  Patient presents with  . Code Stroke    [name] [name] is a 59 y.o. female.  The history is provided by the patient, the EMS personnel and medical records. No language interpreter was used.  Neurologic Problem This is a new problem. The current episode started 3 to 5 hours ago. The problem occurs constantly. The problem has not changed since onset.Associated symptoms include headaches. Pertinent negatives include no chest pain, no abdominal pain and no shortness of breath. Nothing aggravates the symptoms. Nothing relieves the symptoms. She has tried nothing for the symptoms. The treatment provided no relief.       Past Medical History:  Diagnosis Date  . Chickenpox   . Complication of anesthesia    anxiety and panic  . Diabetes mellitus without complication ([place])    diet controlled, no longer needs meds  . Elevated liver enzymes    ABD US done 08/2016: fatty liver  . Fatty liver    ABD US done 08/2016: fatty liver  . Hyperlipidemia    no longer needs meds  . Hypertension    pt states no longer needs meds  . Hypothyroidism   . OA (osteoarthritis) of hip    right  . Schizophrenia ([place])   . Thyroid disease    no meds    Patient Active Problem List   Diagnosis Date Noted  . Dog bite of face 08/13/2018  . Dog bite of ear 08/13/2018  . Diabetes mellitus without complication ([place])   . Mood disorder ([place]) 11/11/2017  . Type 2 diabetes mellitus without complication, without long-term current use of insulin ([place]) 11/11/2017  . Class 2 obesity with body mass index (BMI) of 37.0 to 37.9 in adult 11/11/2017  . Mixed hyperlipidemia 11/11/2017  . Tinea corporis 11/08/2017    Past Surgical History:  Procedure Laterality  Date  . BREAST BIOPSY     needle bx more than 15 yrs benign, no scar visible   . LACERATION REPAIR N/A 12/13/2017   Procedure: Closure of nasal lacerations with one rotator graft and one advanced flap with skin graft, closure of left nasal laceration, closure of left ear.;  Surgeon: [name], MD;  Location: [facility];  Service: ENT;  Laterality: N/A;     OB History   No obstetric history on file.     Family History  Problem Relation Age of Onset  . Alcohol abuse Mother   . Arthritis Mother   . Diabetes Mother   . Hypertension Mother   . Hyperlipidemia Mother   . Alcohol abuse Father   . Arthritis Father   . Asthma Father   . Heart disease Father   . Hypertension Father   . Hyperlipidemia Father   . Alcohol abuse Brother   . Arthritis Brother   . Drug abuse Brother     Social History   Tobacco Use  . Smoking status: Former Smoker    Quit date: 2004    Years since quitting: 17.6  . Smokeless tobacco: Never Used  Vaping Use  . Vaping Use: Never used  Substance Use Topics  . Alcohol use: No  . Drug use: No    Home Medications Prior to Admission medications   Medication Sig Start Date End Date Taking? Authorizing Provider  amphetamine-dextroamphetamine (ADDERALL) 5 MG tablet Take 5 mg by mouth 2 (two) times daily with a meal.    [provider]  GLYCINE PO Take by mouth.    [provider]  ibuprofen (ADVIL,MOTRIN) 200 MG tablet Take 800 mg by mouth daily as needed.    [provider]  Omega-3 1000 MG CAPS Take by mouth.    [provider]  QUEtiapine (SEROQUEL) 100 MG tablet  09/17/18   [provider]  risperiDONE (RISPERDAL) 3 MG tablet  09/22/18   [provider]    Allergies    Citric acid and Dairy aid [lactase]  Review of Systems   Review of Systems  Constitutional: Negative for chills, diaphoresis and fatigue.  HENT: Negative for congestion.   Eyes: Negative for visual disturbance.  Respiratory:  Negative for chest tightness, shortness of breath and wheezing.   Cardiovascular: Negative for chest pain, palpitations and leg swelling.  Gastrointestinal: Negative for abdominal pain, constipation, diarrhea, nausea and vomiting.  Genitourinary: Negative for dysuria.  Musculoskeletal: Negative for back pain, neck pain and neck stiffness.  Neurological: Positive for weakness, numbness and headaches. Negative for dizziness and light-headedness.  Psychiatric/Behavioral: Negative for agitation and confusion.  All other systems reviewed and are negative.   Physical Exam Updated Vital Signs BP (!) 137/52   Pulse 77   Resp 12   SpO2 98%   Physical Exam Vitals and nursing note reviewed.  Constitutional:      General: She is not in acute distress.    Appearance: She is well-developed. She is not ill-appearing, toxic-appearing or diaphoretic.  HENT:     Head: Normocephalic and atraumatic.     Right Ear: External ear normal.     Left Ear: External ear normal.     Nose: Nose normal. No congestion or rhinorrhea.     Mouth/Throat:     Mouth: Mucous membranes are moist.     Pharynx: No oropharyngeal exudate or posterior oropharyngeal erythema.  Eyes:     Conjunctiva/sclera: Conjunctivae normal.     Pupils: Pupils are equal, round, and reactive to light.  Cardiovascular:     Rate and Rhythm: Normal rate.     Pulses: Normal pulses.     Heart sounds: No murmur heard.   Pulmonary:     Effort: Pulmonary effort is normal. No respiratory distress.     Breath sounds: No stridor. No wheezing, rhonchi or rales.  Chest:     Chest wall: No tenderness.  Abdominal:     General: Abdomen is flat. There is no distension.     Tenderness: There is no abdominal tenderness. There is no right CVA tenderness, left CVA tenderness, guarding or rebound.  Musculoskeletal:        General: No tenderness.     Cervical back: Normal range of motion and neck supple. No tenderness.     Right lower leg: No edema.      Left lower leg: No edema.  Skin:    General: Skin is warm.     Capillary Refill: Capillary refill takes less than 2 seconds.     Findings: No erythema or rash.  Neurological:     Mental Status: She is alert and oriented to person, place, and time.     Sensory: Sensory deficit present.     Motor: Weakness present. No abnormal muscle tone.     Coordination: Coordination normal.     Deep Tendon Reflexes: Reflexes are normal and symmetric.  Psychiatric:  Mood and Affect: Mood normal.     ED Results / Procedures / Treatments   Labs (all labs ordered are listed, but only abnormal results are displayed) Labs Reviewed  COMPREHENSIVE METABOLIC PANEL - Abnormal; Notable for the following components:      Result Value   Sodium 132 (*)    CO2 21 (*)    Glucose, Bld 304 (*)    Total Protein 6.1 (*)    Albumin 3.3 (*)    ALT 47 (*)    All other components within normal limits  CBG MONITORING, ED - Abnormal; Notable for the following components:   Glucose-Capillary 266 (*)    All other components within normal limits  SARS CORONAVIRUS 2 BY RT PCR (HOSPITAL ORDER, [facility])  ETHANOL  PROTIME-INR  APTT  CBC  DIFFERENTIAL  RAPID URINE DRUG SCREEN, HOSP PERFORMED  URINALYSIS, ROUTINE W REFLEX MICROSCOPIC  HEMOGLOBIN A1C  I-STAT CHEM 8, ED  I-STAT BETA HCG BLOOD, ED ([facility], [facility], [facility] ONLY)    EKG EKG Interpretation  Date/Time:  Tuesday July 12 2020 20:29:06 EDT Ventricular Rate:  83 PR Interval:    QRS Duration: 79 QT Interval:  384 QTC Calculation: 452 R Axis:   46 Text Interpretation: Sinus rhythm Probable left atrial enlargement Borderline T abnormalities, anterior leads No prior ECG for comparison. No STEMI Confirmed by Antony Blackbird [phone]) on 07/12/2020 8:50:16 PM   Radiology CT Code Stroke CTA Head W/WO contrast  Result Date: 07/12/2020 CLINICAL DATA:  Right-sided weakness EXAM: CT ANGIOGRAPHY HEAD AND NECK CT PERFUSION BRAIN TECHNIQUE:  Multidetector CT imaging of the head and neck was performed using the standard protocol during bolus administration of intravenous contrast. Multiplanar CT image reconstructions and MIPs were obtained to evaluate the vascular anatomy. Carotid stenosis measurements (when applicable) are obtained utilizing NASCET criteria, using the distal internal carotid diameter as the denominator. Multiphase CT imaging of the brain was performed following IV bolus contrast injection. Subsequent parametric perfusion maps were calculated using RAPID software. CONTRAST:  153mL OMNIPAQUE IOHEXOL 350 MG/ML SOLN COMPARISON:  Head CT same day FINDINGS: CTA NECK FINDINGS SKELETON: There is no bony spinal canal stenosis. No lytic or blastic lesion. OTHER NECK: Normal pharynx, larynx and major salivary glands. No cervical lymphadenopathy. Unremarkable thyroid gland. UPPER CHEST: Incompletely visualized 4 mm right upper lobe subpleural nodule. AORTIC ARCH: There is calcific atherosclerosis of the aortic arch. There is no aneurysm, dissection or hemodynamically significant stenosis of the visualized portion of the aorta. Conventional 3 vessel aortic branching pattern. The visualized proximal subclavian arteries are widely patent. RIGHT CAROTID SYSTEM: No dissection, occlusion or aneurysm. Mild atherosclerotic calcification at the carotid bifurcation without hemodynamically significant stenosis. LEFT CAROTID SYSTEM: No dissection, occlusion or aneurysm. Mild atherosclerotic calcification at the carotid bifurcation without hemodynamically significant stenosis. VERTEBRAL ARTERIES: Right dominant configuration. Left vertebral artery is diffusely diminutive. Both origins are clearly patent. There is no dissection, occlusion or flow-limiting stenosis to the skull base (V1-V3 segments). CTA HEAD FINDINGS POSTERIOR CIRCULATION: --Vertebral arteries: Normal right. There is no opacified left V4 segment. --Inferior cerebellar arteries: Normal. --Basilar  artery: Diminutive --Superior cerebellar arteries: Normal. --Posterior cerebral arteries (PCA): Right fetal origin. Normal left with conventional origin. ANTERIOR CIRCULATION: --Intracranial internal carotid arteries: Normal. --Anterior cerebral arteries (ACA): Normal. Both A1 segments are present. Patent anterior communicating artery (a-comm). --Middle cerebral arteries (MCA): Normal. VENOUS SINUSES: As permitted by contrast timing, patent. ANATOMIC VARIANTS: Fetal origin of the right posterior cerebral artery. Review of  the MIP images confirms the above findings. CT Brain Perfusion Findings: ASPECTS: 10 CBF (<30%) Volume: 45mL Perfusion (Tmax>6.0s) volume: 21mL Mismatch Volume: 73mL Infarction Location:None IMPRESSION: 1. No emergent large vessel occlusion or high-grade stenosis of the intracranial arteries. 2. Normal CT perfusion. 3. Diffusely diminutive left vertebral artery. V4 segment may terminate in a small left PICA or be occluded. 4. Aortic Atherosclerosis (ICD10-I70.0). Electronically Signed   By: Ulyses Jarred M.D.   On: 07/12/2020 21:04   CT Code Stroke CTA Neck W/WO contrast  Result Date: 07/12/2020 CLINICAL DATA:  Right-sided weakness EXAM: CT ANGIOGRAPHY HEAD AND NECK CT PERFUSION BRAIN TECHNIQUE: Multidetector CT imaging of the head and neck was performed using the standard protocol during bolus administration of intravenous contrast. Multiplanar CT image reconstructions and MIPs were obtained to evaluate the vascular anatomy. Carotid stenosis measurements (when applicable) are obtained utilizing NASCET criteria, using the distal internal carotid diameter as the denominator. Multiphase CT imaging of the brain was performed following IV bolus contrast injection. Subsequent parametric perfusion maps were calculated using RAPID software. CONTRAST:  148mL OMNIPAQUE IOHEXOL 350 MG/ML SOLN COMPARISON:  Head CT same day FINDINGS: CTA NECK FINDINGS SKELETON: There is no bony spinal canal stenosis. No lytic  or blastic lesion. OTHER NECK: Normal pharynx, larynx and major salivary glands. No cervical lymphadenopathy. Unremarkable thyroid gland. UPPER CHEST: Incompletely visualized 4 mm right upper lobe subpleural nodule. AORTIC ARCH: There is calcific atherosclerosis of the aortic arch. There is no aneurysm, dissection or hemodynamically significant stenosis of the visualized portion of the aorta. Conventional 3 vessel aortic branching pattern. The visualized proximal subclavian arteries are widely patent. RIGHT CAROTID SYSTEM: No dissection, occlusion or aneurysm. Mild atherosclerotic calcification at the carotid bifurcation without hemodynamically significant stenosis. LEFT CAROTID SYSTEM: No dissection, occlusion or aneurysm. Mild atherosclerotic calcification at the carotid bifurcation without hemodynamically significant stenosis. VERTEBRAL ARTERIES: Right dominant configuration. Left vertebral artery is diffusely diminutive. Both origins are clearly patent. There is no dissection, occlusion or flow-limiting stenosis to the skull base (V1-V3 segments). CTA HEAD FINDINGS POSTERIOR CIRCULATION: --Vertebral arteries: Normal right. There is no opacified left V4 segment. --Inferior cerebellar arteries: Normal. --Basilar artery: Diminutive --Superior cerebellar arteries: Normal. --Posterior cerebral arteries (PCA): Right fetal origin. Normal left with conventional origin. ANTERIOR CIRCULATION: --Intracranial internal carotid arteries: Normal. --Anterior cerebral arteries (ACA): Normal. Both A1 segments are present. Patent anterior communicating artery (a-comm). --Middle cerebral arteries (MCA): Normal. VENOUS SINUSES: As permitted by contrast timing, patent. ANATOMIC VARIANTS: Fetal origin of the right posterior cerebral artery. Review of the MIP images confirms the above findings. CT Brain Perfusion Findings: ASPECTS: 10 CBF (<30%) Volume: 75mL Perfusion (Tmax>6.0s) volume: 55mL Mismatch Volume: 69mL Infarction Location:None  IMPRESSION: 1. No emergent large vessel occlusion or high-grade stenosis of the intracranial arteries. 2. Normal CT perfusion. 3. Diffusely diminutive left vertebral artery. V4 segment may terminate in a small left PICA or be occluded. 4. Aortic Atherosclerosis (ICD10-I70.0). Electronically Signed   By: Ulyses Jarred M.D.   On: 07/12/2020 21:04   MR BRAIN WO CONTRAST  Result Date: 07/12/2020 CLINICAL DATA:  Headache with right-sided weakness EXAM: MRI HEAD WITHOUT CONTRAST TECHNIQUE: Multiplanar, multiecho pulse sequences of the brain and surrounding structures were obtained without intravenous contrast. COMPARISON:  CT perfusion 07/12/2020 FINDINGS: Brain: There is a small focus of abnormal diffusion restriction within the left thalamus. Normal white matter signal. Normal volume of CSF spaces. Single focus of chronic microhemorrhage in the right frontal white matter. Normal midline structures. Vascular: Normal flow voids.  Skull and upper cervical spine: Normal marrow signal. Sinuses/Orbits: Normal Other: None IMPRESSION: Small acute/early subacute infarct within the left thalamus. No hemorrhage or mass effect. Electronically Signed   By: Ulyses Jarred M.D.   On: 07/12/2020 22:15   CT Code Stroke Cerebral Perfusion with contrast  Result Date: 07/12/2020 CLINICAL DATA:  Right-sided weakness EXAM: CT ANGIOGRAPHY HEAD AND NECK CT PERFUSION BRAIN TECHNIQUE: Multidetector CT imaging of the head and neck was performed using the standard protocol during bolus administration of intravenous contrast. Multiplanar CT image reconstructions and MIPs were obtained to evaluate the vascular anatomy. Carotid stenosis measurements (when applicable) are obtained utilizing NASCET criteria, using the distal internal carotid diameter as the denominator. Multiphase CT imaging of the brain was performed following IV bolus contrast injection. Subsequent parametric perfusion maps were calculated using RAPID software. CONTRAST:  143mL  OMNIPAQUE IOHEXOL 350 MG/ML SOLN COMPARISON:  Head CT same day FINDINGS: CTA NECK FINDINGS SKELETON: There is no bony spinal canal stenosis. No lytic or blastic lesion. OTHER NECK: Normal pharynx, larynx and major salivary glands. No cervical lymphadenopathy. Unremarkable thyroid gland. UPPER CHEST: Incompletely visualized 4 mm right upper lobe subpleural nodule. AORTIC ARCH: There is calcific atherosclerosis of the aortic arch. There is no aneurysm, dissection or hemodynamically significant stenosis of the visualized portion of the aorta. Conventional 3 vessel aortic branching pattern. The visualized proximal subclavian arteries are widely patent. RIGHT CAROTID SYSTEM: No dissection, occlusion or aneurysm. Mild atherosclerotic calcification at the carotid bifurcation without hemodynamically significant stenosis. LEFT CAROTID SYSTEM: No dissection, occlusion or aneurysm. Mild atherosclerotic calcification at the carotid bifurcation without hemodynamically significant stenosis. VERTEBRAL ARTERIES: Right dominant configuration. Left vertebral artery is diffusely diminutive. Both origins are clearly patent. There is no dissection, occlusion or flow-limiting stenosis to the skull base (V1-V3 segments). CTA HEAD FINDINGS POSTERIOR CIRCULATION: --Vertebral arteries: Normal right. There is no opacified left V4 segment. --Inferior cerebellar arteries: Normal. --Basilar artery: Diminutive --Superior cerebellar arteries: Normal. --Posterior cerebral arteries (PCA): Right fetal origin. Normal left with conventional origin. ANTERIOR CIRCULATION: --Intracranial internal carotid arteries: Normal. --Anterior cerebral arteries (ACA): Normal. Both A1 segments are present. Patent anterior communicating artery (a-comm). --Middle cerebral arteries (MCA): Normal. VENOUS SINUSES: As permitted by contrast timing, patent. ANATOMIC VARIANTS: Fetal origin of the right posterior cerebral artery. Review of the MIP images confirms the above  findings. CT Brain Perfusion Findings: ASPECTS: 10 CBF (<30%) Volume: 90mL Perfusion (Tmax>6.0s) volume: 5mL Mismatch Volume: 52mL Infarction Location:None IMPRESSION: 1. No emergent large vessel occlusion or high-grade stenosis of the intracranial arteries. 2. Normal CT perfusion. 3. Diffusely diminutive left vertebral artery. V4 segment may terminate in a small left PICA or be occluded. 4. Aortic Atherosclerosis (ICD10-I70.0). Electronically Signed   By: Ulyses Jarred M.D.   On: 07/12/2020 21:04   CT HEAD CODE STROKE WO CONTRAST  Result Date: 07/12/2020 CLINICAL DATA:  Code stroke.  Right-sided weakness. EXAM: CT HEAD WITHOUT CONTRAST TECHNIQUE: Contiguous axial images were obtained from the base of the skull through the vertex without intravenous contrast. COMPARISON:  None. FINDINGS: Brain: Normal appearance without evidence of atrophy, old or acute infarction, mass lesion, hemorrhage, hydrocephalus or extra-axial collection. Vascular: No abnormal vascular finding. Skull: Normal Sinuses/Orbits: Clear/normal Other: None ASPECTS ([place] Stroke Program Early CT Score) - Ganglionic level infarction (caudate, lentiform nuclei, internal capsule, insula, M1-M3 cortex): 7 - Supraganglionic infarction (M4-M6 cortex): 3 Total score (0-10 with 10 being normal): 10 IMPRESSION: 1. Normal head CT. 2. ASPECTS is 10. 3. These results were communicated to  Dr. Leonel Ramsay at 7:58 pmon 8/31/2021by text page via the [facility] messaging system. Electronically Signed   By: Nelson Chimes M.D.   On: 07/12/2020 19:59    Procedures Procedures (including critical care time)  Medications Ordered in ED Medications  insulin aspart (novoLOG) injection 0-15 Units (8 Units Subcutaneous Given 07/12/20 2322)  iohexol (OMNIPAQUE) 350 MG/ML injection 100 mL (100 mLs Intravenous Contrast Given 07/12/20 2019)    ED Course  I have reviewed the triage vital signs and the nursing notes.  Pertinent labs & imaging results that were available  during my care of the patient were reviewed by me and considered in my medical decision making (see chart for details).    MDM Rules/Calculators/A&P                          [name] [name] is a 59 y.o. female with a past medical history significant for migraines, diabetes, hyperlipidemia, schizophrenia, hypertension, who presents as a code stroke.  According to patient, patient had symptoms began at around 2:30 PM today when she started having some headache and developed right arm and right leg weakness and numbness.  Patient also had some right facial droop reported.  Patient also reportedly had some difficulty speaking initially.  Patient was activated as a code stroke.  On arrival, patient had clear speech.  I did not appreciate facial droop and patient subjectively reported numbness in her right face, arm, leg.  She also was having some inconsistent weakness in her right arm and right leg on exam.  Lungs were clear and chest was nontender.  Abdomen was nontender.  Pupils are symmetric and reactive.  Patient quickly was taken to the CT scanner where she had CT head and CTA head which did not show acute stroke or bleed.  Neurology suspect this is more of a complicated migraine contributing to symptoms as she has a history of migraines including those with auras.  If MRI is negative, he recommends treating with a migraine cocktail and reassessment.  He reports that if her MRI is negative and symptoms improved, she is likely stable for discharge home today.  MRI returned showing acute stroke. Neurology recommended admission. Medicine will admit.  Final Clinical Impression(s) / ED Diagnoses Final diagnoses:  Cerebrovascular accident (CVA), unspecified mechanism ([place])    Rx / DC Orders ED Discharge Orders    None     Clinical Impression: 1. Cerebrovascular accident (CVA), unspecified mechanism ([place])     Disposition: Admit  This note was prepared with assistance of Dragon voice  recognition software. Occasional wrong-word or sound-a-like substitutions may have occurred due to the inherent limitations of voice recognition software.     Cadi Rhinehart, Gwenyth Allegra, MD 07/13/20 0010

## 2020-07-12 NOTE — ED Triage Notes (Signed)
Pt BIB [facility] as a code stroke, pt reports headache that started at 1430 today, pt has developed right arm and leg weakness, right facial droop and numbness to the right side.

## 2020-07-12 NOTE — Code Documentation (Signed)
Responded to Code Stroke called at [facility] for R sided weakness, decreased motor function, and abnormal gait, [id number]. Pt arrived at [facility], [facility]-6 for R facial droop, R sided weakness, and R sided sensory deficit. CT head negative, CTA-no LVO, CTP-normal. Plan for MRI.

## 2020-07-12 NOTE — Consult Note (Signed)
Neurology Consultation Reason for Consult: Right-sided weakness Referring Physician: Tegeler, C  CC: Right-sided weakness  History is obtained from: patient  HPI: [name] [name] is a 59 y.o. female with a history of migraine with aura who presents with right-sided weakness and numbness and some difficulty with expressing herself and started sometime around 34.  She states that initially started off feeling "like a migraine," however subsequently got worse.  Also, she has historically had visual aura with her migraines but not like this.  Her headache is currently towards the back of her head wrapping around to her face.  She went through menopause at age 89, and has not had a migraine since then.   LKW: 2:30 pm tpa given?: no, outside of window    ROS: A 14 point ROS was performed and is negative except as noted in the HPI.   Past Medical History:  Diagnosis Date  . Chickenpox   . Complication of anesthesia    anxiety and panic  . Diabetes mellitus without complication ([place])    diet controlled, no longer needs meds  . Elevated liver enzymes    ABD US done 08/2016: fatty liver  . Fatty liver    ABD US done 08/2016: fatty liver  . Hyperlipidemia    no longer needs meds  . Hypertension    pt states no longer needs meds  . Hypothyroidism   . OA (osteoarthritis) of hip    right  . Schizophrenia ([place])   . Thyroid disease    no meds     Family History  Problem Relation Age of Onset  . Alcohol abuse Mother   . Arthritis Mother   . Diabetes Mother   . Hypertension Mother   . Hyperlipidemia Mother   . Alcohol abuse Father   . Arthritis Father   . Asthma Father   . Heart disease Father   . Hypertension Father   . Hyperlipidemia Father   . Alcohol abuse Brother   . Arthritis Brother   . Drug abuse Brother      Social History:  reports that she quit smoking about 17 years ago. She has never used smokeless tobacco. She reports that she does not drink alcohol and  does not use drugs.   Exam: Current vital signs: There were no vitals filed for this visit. Vital signs in last 24 hours:     Physical Exam  Constitutional: Appears well-developed and well-nourished.  Psych: Affect appropriate to situation Eyes: No scleral injection HENT: No OP obstrucion MSK: no joint deformities.  Cardiovascular: Normal rate and regular rhythm.  Respiratory: Effort normal, non-labored breathing GI: Soft.  No distension. There is no tenderness.  Skin: WDI  Neuro: Mental Status: Patient is awake, alert, oriented to person, place, month, year, and situation. Patient is able to give a clear and coherent history. No signs of neglect, she has occasional mild stutter and latency of speech, without evidence of dysnomia Cranial Nerves: II: Visual Fields are full. Pupils are equal, round, and reactive to light.   III,IV, VI: EOMI without ptosis or diploplia.  V: Facial sensation is symmetric to temperature VII: Facial movement with?  Mild decreased nasolabial fold, no airleak with puffing cheeks VIII: hearing is intact to voice X: Uvula elevates symmetrically XI: Shoulder shrug is symmetric. XII: tongue is midline without atrophy or fasciculations.  Motor: Tone is normal. Bulk is normal. 5/5 strength was present on the left, she has 4/5 weakness of the right arm and leg Sensory:  Sensation is diminished on the right Cerebellar: No clear ataxia, but consistent with weakness on the right.       I have reviewed labs in epic and the results pertinent to this consultation are: Cr 0.72 Na 132  I have reviewed the images obtained: CTA - no LVO  Impression: 59 yo F with new onset right sided paresthesia and weakness. She is out of the window for tPA and has no target for acute intervention. Though with sprreading paresthesia, complicated migraine was considered, imaging has confirmed acute thalamic infarct, likely small vessel in nature. She will need to be  admitted for therapy and secondary risk factor modification.  Recommendations: - HgbA1c, fasting lipid panel - Frequent neuro checks - Echocardiogram - Prophylactic therapy-Antiplatelet med: Aspirin - dose 325mg  PO or 300mg  PR - Risk factor modification - Telemetry monitoring - PT consult, OT consult, Speech consult - Stroke team to follow  Roland Rack, MD [facility] [phone]  If 7pm- 7am, please page neurology on call as listed in [place].

## 2020-07-12 NOTE — ED Notes (Signed)
Patient transported to MRI 

## 2020-07-13 ENCOUNTER — Inpatient Hospital Stay (HOSPITAL_COMMUNITY): Payer: Medicare PPO

## 2020-07-13 DIAGNOSIS — Z87891 Personal history of nicotine dependence: Secondary | ICD-10-CM | POA: Diagnosis not present

## 2020-07-13 DIAGNOSIS — E669 Obesity, unspecified: Secondary | ICD-10-CM | POA: Diagnosis not present

## 2020-07-13 DIAGNOSIS — I6389 Other cerebral infarction: Secondary | ICD-10-CM | POA: Diagnosis not present

## 2020-07-13 DIAGNOSIS — Z794 Long term (current) use of insulin: Secondary | ICD-10-CM | POA: Diagnosis not present

## 2020-07-13 DIAGNOSIS — I69351 Hemiplegia and hemiparesis following cerebral infarction affecting right dominant side: Secondary | ICD-10-CM | POA: Diagnosis not present

## 2020-07-13 DIAGNOSIS — E119 Type 2 diabetes mellitus without complications: Secondary | ICD-10-CM | POA: Diagnosis not present

## 2020-07-13 DIAGNOSIS — R0989 Other specified symptoms and signs involving the circulatory and respiratory systems: Secondary | ICD-10-CM | POA: Diagnosis not present

## 2020-07-13 DIAGNOSIS — E1151 Type 2 diabetes mellitus with diabetic peripheral angiopathy without gangrene: Secondary | ICD-10-CM | POA: Diagnosis present

## 2020-07-13 DIAGNOSIS — Z79899 Other long term (current) drug therapy: Secondary | ICD-10-CM | POA: Diagnosis not present

## 2020-07-13 DIAGNOSIS — E782 Mixed hyperlipidemia: Secondary | ICD-10-CM | POA: Diagnosis present

## 2020-07-13 DIAGNOSIS — E039 Hypothyroidism, unspecified: Secondary | ICD-10-CM | POA: Diagnosis present

## 2020-07-13 DIAGNOSIS — I6381 Other cerebral infarction due to occlusion or stenosis of small artery: Secondary | ICD-10-CM | POA: Diagnosis present

## 2020-07-13 DIAGNOSIS — R030 Elevated blood-pressure reading, without diagnosis of hypertension: Secondary | ICD-10-CM | POA: Diagnosis not present

## 2020-07-13 DIAGNOSIS — E1165 Type 2 diabetes mellitus with hyperglycemia: Secondary | ICD-10-CM | POA: Diagnosis present

## 2020-07-13 DIAGNOSIS — I639 Cerebral infarction, unspecified: Secondary | ICD-10-CM | POA: Diagnosis present

## 2020-07-13 DIAGNOSIS — E1169 Type 2 diabetes mellitus with other specified complication: Secondary | ICD-10-CM | POA: Diagnosis not present

## 2020-07-13 DIAGNOSIS — F4310 Post-traumatic stress disorder, unspecified: Secondary | ICD-10-CM | POA: Diagnosis present

## 2020-07-13 DIAGNOSIS — D751 Secondary polycythemia: Secondary | ICD-10-CM | POA: Diagnosis present

## 2020-07-13 DIAGNOSIS — G4733 Obstructive sleep apnea (adult) (pediatric): Secondary | ICD-10-CM | POA: Diagnosis present

## 2020-07-13 DIAGNOSIS — I10 Essential (primary) hypertension: Secondary | ICD-10-CM | POA: Diagnosis present

## 2020-07-13 DIAGNOSIS — R7401 Elevation of levels of liver transaminase levels: Secondary | ICD-10-CM | POA: Diagnosis not present

## 2020-07-13 DIAGNOSIS — K760 Fatty (change of) liver, not elsewhere classified: Secondary | ICD-10-CM | POA: Diagnosis present

## 2020-07-13 DIAGNOSIS — Z6841 Body Mass Index (BMI) 40.0 and over, adult: Secondary | ICD-10-CM | POA: Diagnosis not present

## 2020-07-13 DIAGNOSIS — Z20822 Contact with and (suspected) exposure to covid-19: Secondary | ICD-10-CM | POA: Diagnosis present

## 2020-07-13 DIAGNOSIS — F259 Schizoaffective disorder, unspecified: Secondary | ICD-10-CM | POA: Diagnosis present

## 2020-07-13 DIAGNOSIS — G8191 Hemiplegia, unspecified affecting right dominant side: Secondary | ICD-10-CM | POA: Diagnosis present

## 2020-07-13 LAB — URINALYSIS, ROUTINE W REFLEX MICROSCOPIC
Bilirubin Urine: NEGATIVE
Glucose, UA: >=500 mg/dL — AB
Hgb urine dipstick: NEGATIVE
Ketones, ur: NEGATIVE mg/dL
Leukocytes,Ua: NEGATIVE
Nitrite: NEGATIVE
Protein, ur: NEGATIVE mg/dL
Specific Gravity, Urine: 1.023 (ref 1.005–1.030)
pH: 5 (ref 5.0–8.0)

## 2020-07-13 LAB — RAPID URINE DRUG SCREEN, HOSP PERFORMED
Amphetamines: NOT DETECTED
Barbiturates: NOT DETECTED
Benzodiazepines: NOT DETECTED
Cocaine: NOT DETECTED
Opiates: NOT DETECTED
Tetrahydrocannabinol: POSITIVE — AB

## 2020-07-13 LAB — CBG MONITORING, ED
Glucose-Capillary: 222 mg/dL — ABNORMAL HIGH (ref 70–99)
Glucose-Capillary: 198 mg/dL — ABNORMAL HIGH (ref 70–99)
Glucose-Capillary: 201 mg/dL — ABNORMAL HIGH (ref 70–99)
Glucose-Capillary: 205 mg/dL — ABNORMAL HIGH (ref 70–99)

## 2020-07-13 LAB — LIPID PANEL
Cholesterol: 274 mg/dL — ABNORMAL HIGH (ref 0–200)
HDL: 37 mg/dL — ABNORMAL LOW (ref >40)
LDL Cholesterol: 191 mg/dL — ABNORMAL HIGH (ref 0–99)
Total CHOL/HDL Ratio: 7.4 RATIO
Triglycerides: 232 mg/dL — ABNORMAL HIGH (ref <150)
VLDL: 46 mg/dL — ABNORMAL HIGH (ref 0–40)

## 2020-07-13 LAB — ECHOCARDIOGRAM COMPLETE
Area-P 1/2: 2.69 cm2
S' Lateral: 2.1 cm

## 2020-07-13 LAB — GLUCOSE, CAPILLARY
Glucose-Capillary: 175 mg/dL — ABNORMAL HIGH (ref 70–99)
Glucose-Capillary: 264 mg/dL — ABNORMAL HIGH (ref 70–99)

## 2020-07-13 LAB — HIV ANTIBODY (ROUTINE TESTING W REFLEX): HIV Screen 4th Generation wRfx: NONREACTIVE

## 2020-07-13 LAB — SARS CORONAVIRUS 2 BY RT PCR (HOSPITAL ORDER, PERFORMED IN ~~LOC~~ HOSPITAL LAB): SARS Coronavirus 2: NEGATIVE

## 2020-07-13 IMAGING — CR DG CHEST 2V
2 series · 2 of 2 positions shown · non-contrast
Comparison: None.

CLINICAL DATA: Left thalamic infarct

EXAM:
CHEST - 2 VIEW

[chest lat]
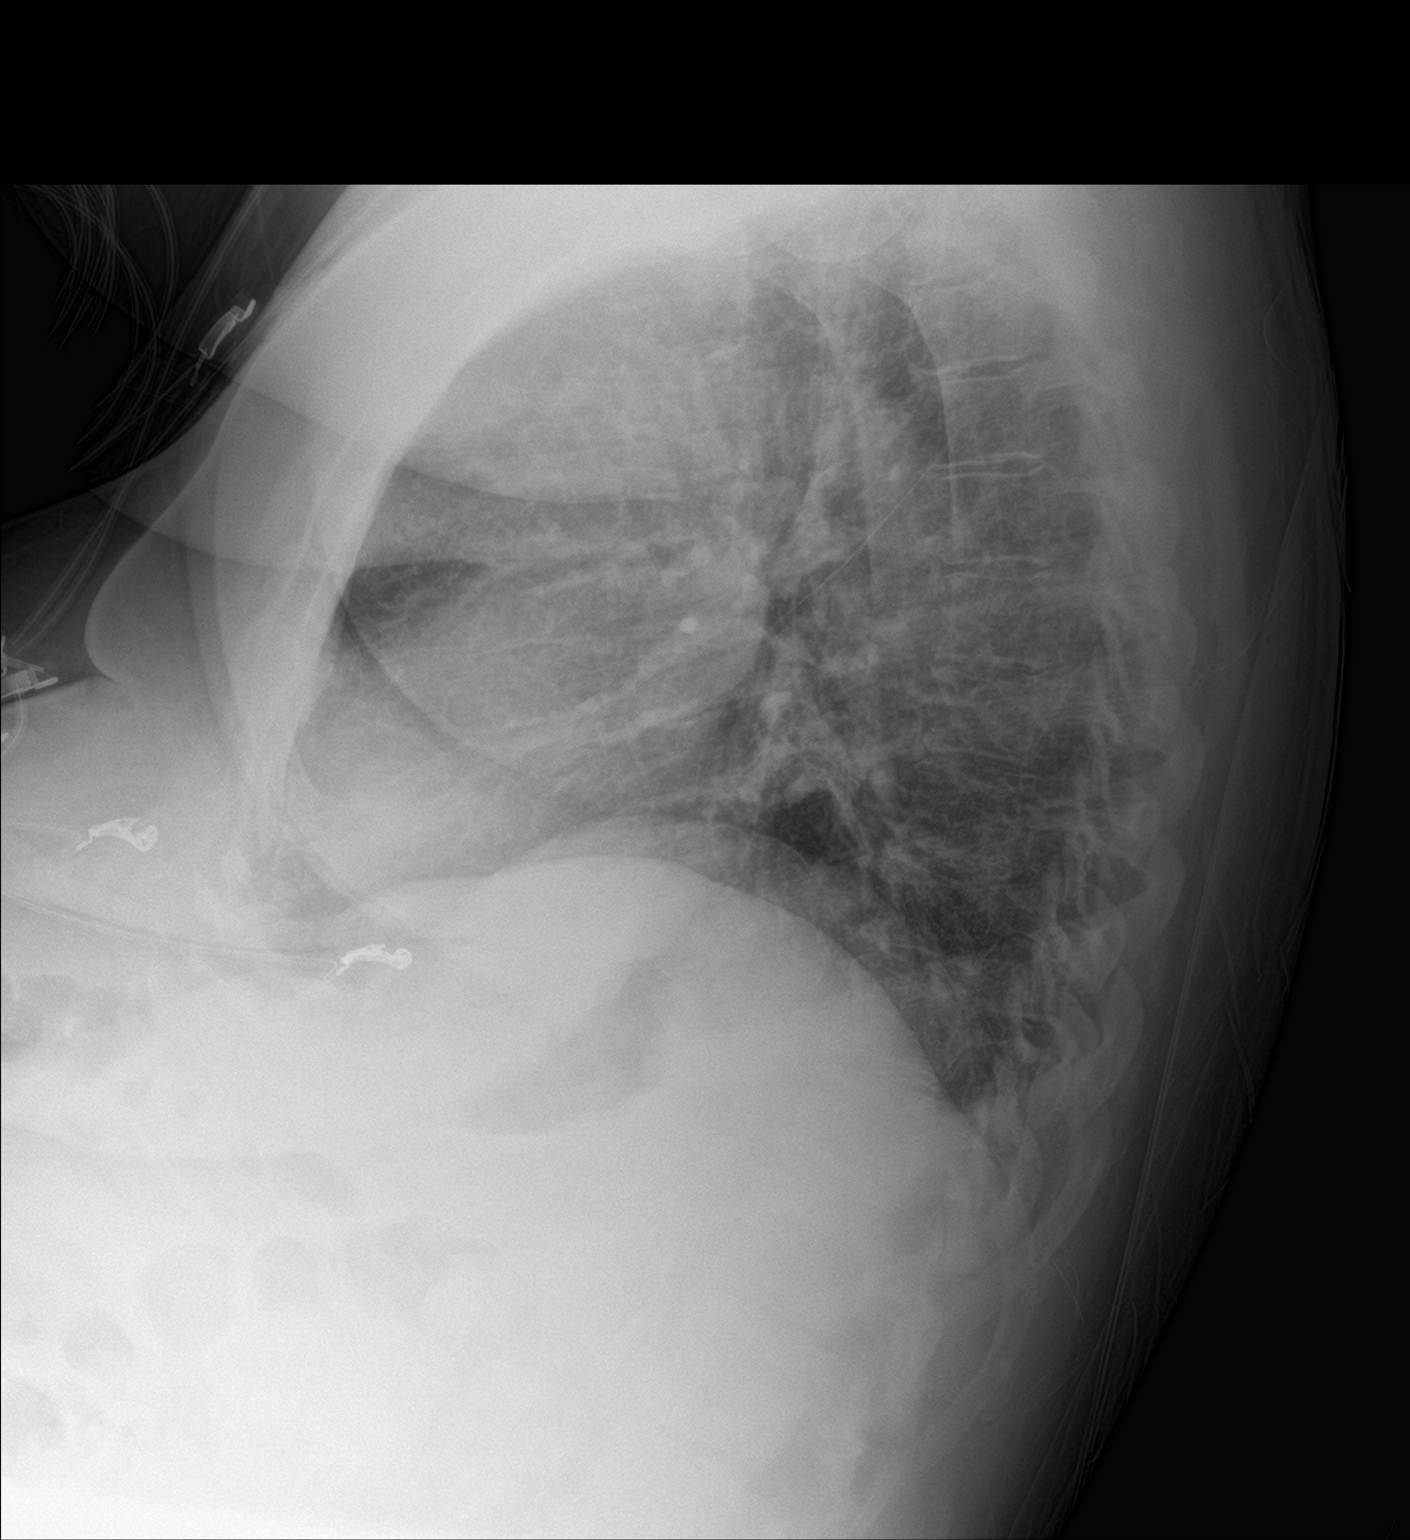

[chest ap]
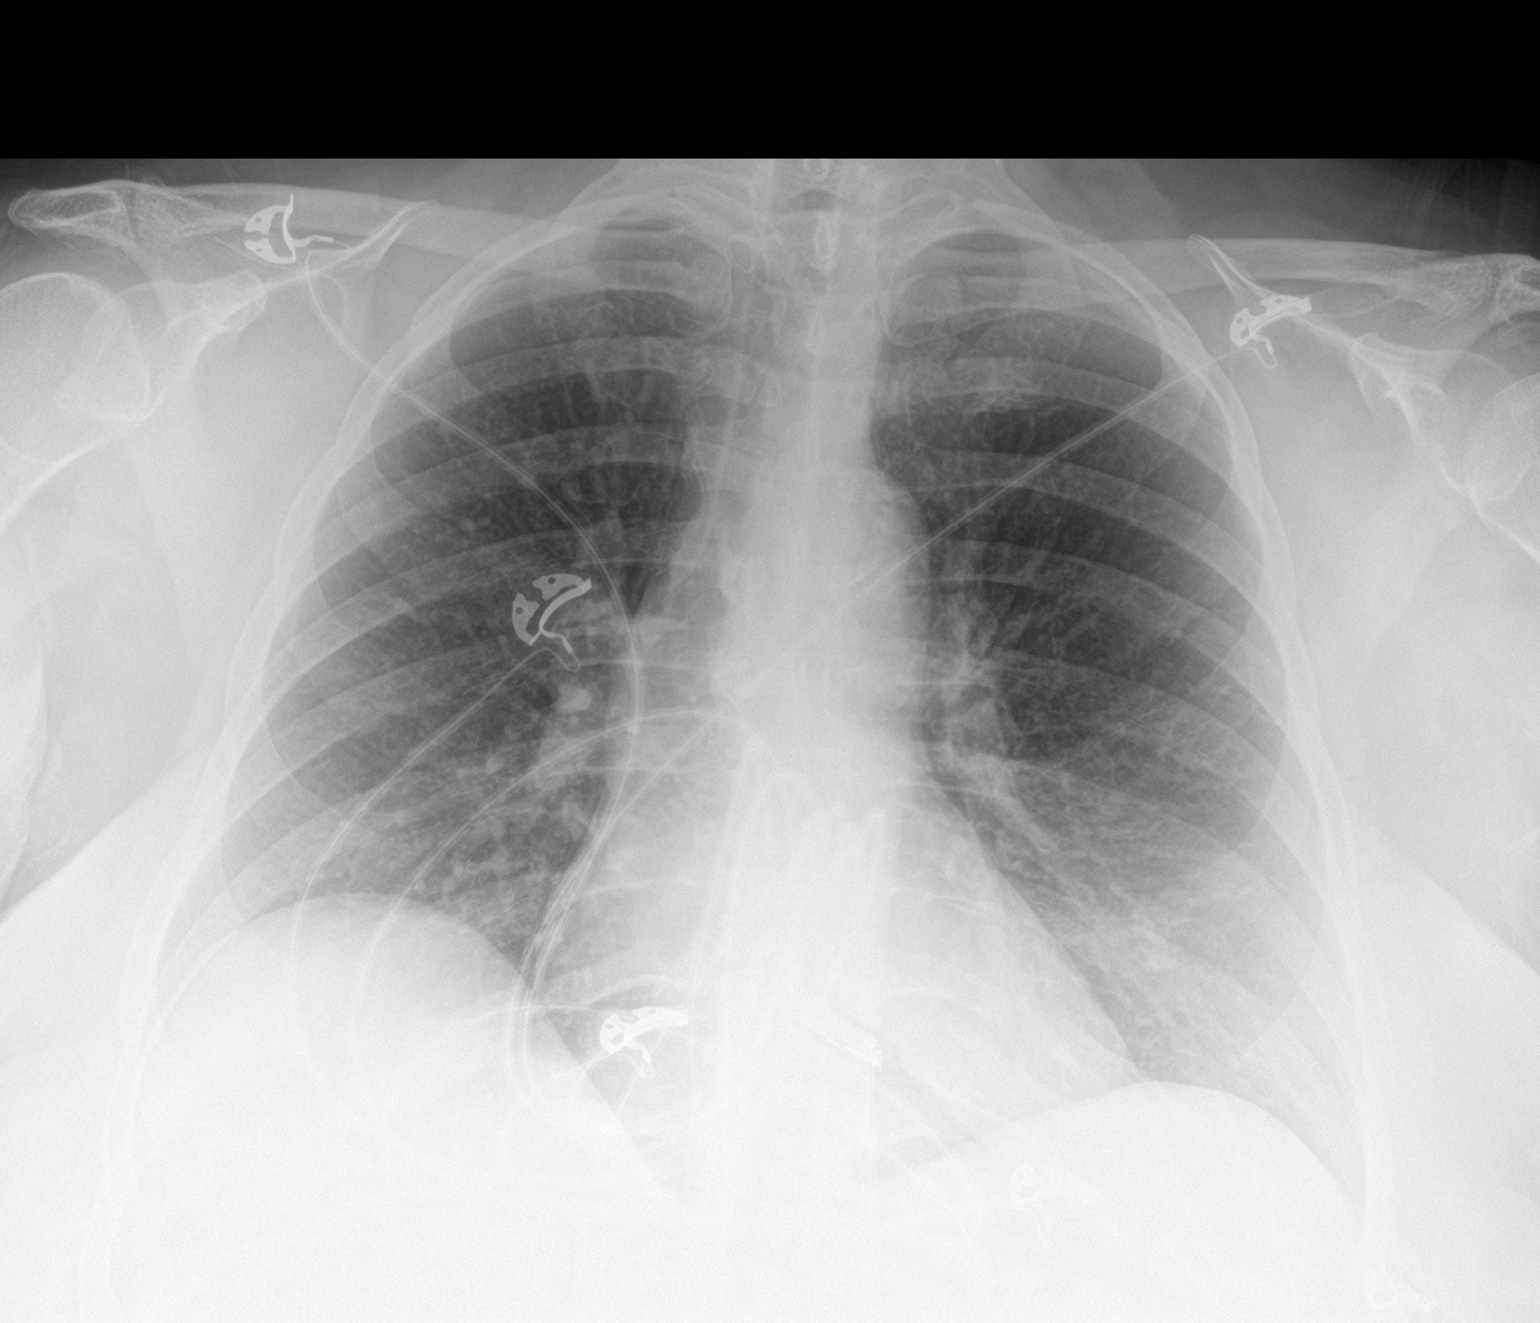

[2 of 2 positions shown; findings below may reference images not displayed]

FINDINGS: Frontal and lateral views of the chest demonstrate an unremarkable
cardiac silhouette. No airspace disease, effusion, or pneumothorax.
No acute bony abnormalities.
IMPRESSION: 1. No acute intrathoracic process.

## 2020-07-13 MED ORDER — ATORVASTATIN CALCIUM 40 MG PO TABS: 40.0000 mg | ORAL_TABLET | Freq: Every day | ORAL | Status: DC

## 2020-07-13 MED ORDER — ACETAMINOPHEN 325 MG PO TABS
650.0000 mg | ORAL_TABLET | ORAL | Status: DC | PRN
  Administered 2020-07-14 – 2020-07-19 (×7): 650 mg via ORAL
  Filled 2020-07-13 (×8): qty 2

## 2020-07-13 MED ORDER — ENOXAPARIN SODIUM 40 MG/0.4ML ~~LOC~~ SOLN
40.0000 mg | Freq: Every day | SUBCUTANEOUS | Status: DC
  Administered 2020-07-14 – 2020-07-19 (×6): 40 mg via SUBCUTANEOUS
  Filled 2020-07-13 (×6): qty 0.4

## 2020-07-13 MED ORDER — ACETAMINOPHEN 160 MG/5ML PO SOLN: 650.0000 mg | ORAL | Status: DC | PRN

## 2020-07-13 MED ORDER — ATORVASTATIN CALCIUM 80 MG PO TABS
80.0000 mg | ORAL_TABLET | Freq: Every day | ORAL | Status: DC
  Administered 2020-07-13 – 2020-07-19 (×7): 80 mg via ORAL
  Filled 2020-07-13 (×7): qty 1

## 2020-07-13 MED ORDER — ASPIRIN 300 MG RE SUPP: 300.0000 mg | Freq: Every day | RECTAL | Status: DC

## 2020-07-13 MED ORDER — ASPIRIN 325 MG PO TABS: 325.0000 mg | ORAL_TABLET | Freq: Every day | ORAL | Status: DC

## 2020-07-13 MED ORDER — CLOPIDOGREL BISULFATE 75 MG PO TABS
75.0000 mg | ORAL_TABLET | Freq: Every day | ORAL | Status: DC
  Administered 2020-07-13 – 2020-07-19 (×7): 75 mg via ORAL
  Filled 2020-07-13 (×7): qty 1

## 2020-07-13 MED ORDER — ACETAMINOPHEN 650 MG RE SUPP: 650.0000 mg | RECTAL | Status: DC | PRN

## 2020-07-13 MED ORDER — ASPIRIN EC 81 MG PO TBEC
81.0000 mg | DELAYED_RELEASE_TABLET | Freq: Every day | ORAL | Status: DC
  Administered 2020-07-13 – 2020-07-19 (×7): 81 mg via ORAL
  Filled 2020-07-13 (×7): qty 1

## 2020-07-13 MED ORDER — ASPIRIN EC 325 MG PO TBEC: 325.0000 mg | DELAYED_RELEASE_TABLET | Freq: Once | ORAL | Status: DC

## 2020-07-13 MED ORDER — STROKE: EARLY STAGES OF RECOVERY BOOK: Freq: Once | Status: DC

## 2020-07-13 NOTE — Progress Notes (Signed)
PROGRESS NOTE  [name] [name] BMW:[id number] DOB: 1961-04-23 DOA: 07/12/2020 PCP: Dois Davenport, MD  HPI/Recap of past 24 hours: HPI from Dr [name] [name] is a 59 y.o. female with medical history significant of DM2, HTN, HLD.  Pt presents to ED with c/o R sided weakness and numbness and difficulty with speech expression.  Had headache that started off "like a migraine" but then got worse.  Hasnt had a migraine since menopause around age 52. ED Course: MRI brain demonstrates acute ischemic stroke.  Neurology consulted.  Patient admitted for further management.    Today, patient denies any new complaints, just reports pain tired.  Denies any chest pain, shortness of breath, abdominal pain, nausea/vomiting, fever/chills, worsening headache.   Assessment/Plan: Principal Problem:   Acute ischemic stroke [facility]) Active Problems:   Type 2 diabetes mellitus without complication, without long-term current use of insulin (HCC)   Mixed hyperlipidemia   Acute to subacute ischemic L thalamic infarct MRI showed above CT angiogram showed no significant large vessel intracranial or extracranial stenosis Echo EF of 70 to 75%, no regional wall motion abnormality, grade 2 diastolic dysfunction.  No atrial level shunt detected by color-flow Doppler LDL 191, A1c pending Neurology on board, start aspirin 81, Plavix 75 mg daily for 3 weeks and then aspirin alone PT/OT/SLP  Hypertension Permissive hypertension  Hyperlipidemia LDL 191 Start Lipitor 80 mg daily Monitor LFTs given history of fatty liver  Diabetes mellitus type 2 A1c pending SSI, Accu-Cheks, hypoglycemic protocol  Marijuana use UDS showed marijuana       Malnutrition Type:      Malnutrition Characteristics:      Nutrition Interventions:       Estimated body mass index is 40.74 kg/m as calculated from the following:   Height as of 09/30/18: 5\' 3"  (1.6 m).   Weight as of 09/30/18: 104.3 kg.       Code Status: Full  Family Communication: Discussed with patient  Disposition Plan: Status is: Inpatient  Remains inpatient appropriate because:Inpatient level of care appropriate due to severity of illness   Dispo: The patient is from: Home              Anticipated d/c is to: CIR              Anticipated d/c date is: 1 day              Patient currently is not medically stable to d/c.    Consultants:  Neurology  Procedures:  None  Antimicrobials:  None  DVT prophylaxis: Lovenox   Objective: Vitals:   07/13/20 1600 07/13/20 1615 07/13/20 1632 07/13/20 2000  BP: (!) 133/103 134/87 (!) 141/88 (!) 141/74  Pulse:   79 76  Resp:  16 16 18   Temp:  98.3 F (36.8 C) 99 F (37.2 C) 98.3 F (36.8 C)  TempSrc:  Oral Oral Oral  SpO2:  96% 100% 98%   No intake or output data in the 24 hours ending 07/13/20 2007 There were no vitals filed for this visit.  Exam:  General: NAD   Cardiovascular: S1, S2 present  Respiratory: CTAB  Abdomen: Soft, nontender, nondistended, bowel sounds present  Musculoskeletal: No bilateral pedal edema noted  Skin: Normal  Psychiatry: Normal mood  Neurology: 4/5 strength on right upper and lower extremity, no sensory deficit    Data Reviewed: CBC: Recent Labs  Lab 07/12/20 2031  WBC 5.7  NEUTROABS 2.7  HGB 13.8  HCT 42.8  MCV 94.3  PLT 162   Basic Metabolic Panel: Recent Labs  Lab 07/12/20 2031  NA 132*  K 4.2  CL 101  CO2 21*  GLUCOSE 304*  BUN 17  CREATININE 0.72  CALCIUM 9.1   GFR: CrCl cannot be calculated (Unknown ideal weight.). Liver Function Tests: Recent Labs  Lab 07/12/20 2031  AST 30  ALT 47*  ALKPHOS 76  BILITOT 0.5  PROT 6.1*  ALBUMIN 3.3*   No results for input(s): LIPASE, AMYLASE in the last 168 hours. No results for input(s): AMMONIA in the last 168 hours. Coagulation Profile: Recent Labs  Lab 07/12/20 2031  INR 1.0   Cardiac Enzymes: No results for input(s): CKTOTAL,  CKMB, CKMBINDEX, TROPONINI in the last 168 hours. BNP (last 3 results) No results for input(s): PROBNP in the last 8760 hours. HbA1C: No results for input(s): HGBA1C in the last 72 hours. CBG: Recent Labs  Lab 07/12/20 2318 07/13/20 0559 07/13/20 0801 07/13/20 1149 07/13/20 1615  GLUCAP 266* 222* 198* 201* 205*   Lipid Profile: Recent Labs    07/13/20 0519  CHOL 274*  HDL 37*  LDLCALC 191*  TRIG 232*  CHOLHDL 7.4   Thyroid Function Tests: No results for input(s): TSH, T4TOTAL, FREET4, T3FREE, THYROIDAB in the last 72 hours. Anemia Panel: No results for input(s): VITAMINB12, FOLATE, FERRITIN, TIBC, IRON, RETICCTPCT in the last 72 hours. Urine analysis:    Component Value Date/Time   COLORURINE YELLOW 07/13/2020 1555   APPEARANCEUR CLEAR 07/13/2020 1555   LABSPEC 1.023 07/13/2020 1555   PHURINE 5.0 07/13/2020 1555   GLUCOSEU >=500 (A) 07/13/2020 1555   HGBUR NEGATIVE 07/13/2020 1555   BILIRUBINUR NEGATIVE 07/13/2020 1555   KETONESUR NEGATIVE 07/13/2020 1555   PROTEINUR NEGATIVE 07/13/2020 1555   NITRITE NEGATIVE 07/13/2020 1555   LEUKOCYTESUR NEGATIVE 07/13/2020 1555   Sepsis Labs: @LABRCNTIP (procalcitonin:4,lacticidven:4)  ) Recent Results (from the past 240 hour(s))  SARS Coronavirus 2 by RT PCR (hospital order, performed in [facility] hospital lab) Nasopharyngeal Nasopharyngeal Swab     Status: None   Collection Time: 07/13/20 12:17 AM   Specimen: Nasopharyngeal Swab  Result Value Ref Range Status   SARS Coronavirus 2 NEGATIVE NEGATIVE Final    Comment: (NOTE) SARS-CoV-2 target nucleic acids are NOT DETECTED.  The SARS-CoV-2 RNA is generally detectable in upper and lower respiratory specimens during the acute phase of infection. The lowest concentration of SARS-CoV-2 viral copies this assay can detect is 250 copies / mL. A negative result does not preclude SARS-CoV-2 infection and should not be used as the sole basis for treatment or other patient  management decisions.  A negative result may occur with improper specimen collection / handling, submission of specimen other than nasopharyngeal swab, presence of viral mutation(s) within the areas targeted by this assay, and inadequate number of viral copies (<250 copies / mL). A negative result must be combined with clinical observations, patient history, and epidemiological information.  Fact Sheet for Patients:   BoilerBrush.com.cy  Fact Sheet for Healthcare Providers: https://pope.com/  This test is not yet approved or  cleared by the [place] FDA and has been authorized for detection and/or diagnosis of SARS-CoV-2 by FDA under an Emergency Use Authorization (EUA).  This EUA will remain in effect (meaning this test can be used) for the duration of the COVID-19 declaration under Section 564(b)(1) of the Act, 21 U.S.C. section 360bbb-3(b)(1), unless the authorization is terminated or revoked sooner.  Performed at [facility], [address]  [place], [place] [address]       Studies: DG Chest 2 View  Result Date: 07/13/2020 CLINICAL DATA:  Left thalamic infarct EXAM: CHEST - 2 VIEW COMPARISON:  None. FINDINGS: Frontal and lateral views of the chest demonstrate an unremarkable cardiac silhouette. No airspace disease, effusion, or pneumothorax. No acute bony abnormalities. IMPRESSION: 1. No acute intrathoracic process. Electronically Signed   By: Sharlet Salina M.D.   On: 07/13/2020 00:50   MR BRAIN WO CONTRAST  Result Date: 07/12/2020 CLINICAL DATA:  Headache with right-sided weakness EXAM: MRI HEAD WITHOUT CONTRAST TECHNIQUE: Multiplanar, multiecho pulse sequences of the brain and surrounding structures were obtained without intravenous contrast. COMPARISON:  CT perfusion 07/12/2020 FINDINGS: Brain: There is a small focus of abnormal diffusion restriction within the left thalamus. Normal white matter signal. Normal volume  of CSF spaces. Single focus of chronic microhemorrhage in the right frontal white matter. Normal midline structures. Vascular: Normal flow voids. Skull and upper cervical spine: Normal marrow signal. Sinuses/Orbits: Normal Other: None IMPRESSION: Small acute/early subacute infarct within the left thalamus. No hemorrhage or mass effect. Electronically Signed   By: Deatra Robinson M.D.   On: 07/12/2020 22:15   ECHOCARDIOGRAM COMPLETE  Result Date: 07/13/2020    ECHOCARDIOGRAM REPORT   Patient Name:   CRYSTOL ISENBARGER [facility] Date of Exam: 07/13/2020 Medical Rec #:  [id number]           Height:       63.0 in Accession #:    [id number]          Weight:       230.0 lb Date of Birth:  1961/06/29           BSA:          2.052 m Patient Age:    59 years            BP:           173/62 mmHg Patient Gender: F                   HR:           70 bpm. Exam Location:  Inpatient Procedure: 2D Echo, Cardiac Doppler and Color Doppler Indications:    Stroke 434.91 / I163.9  History:        Patient has no prior history of Echocardiogram examinations.  Sonographer:    Elmarie Shiley Dance Referring Phys: 55 JARED M GARDNER IMPRESSIONS  1. Left ventricular ejection fraction, by estimation, is 70 to 75%. The left ventricle has hyperdynamic function. The left ventricle has no regional wall motion abnormalities. There is mild concentric left ventricular hypertrophy. Left ventricular diastolic parameters are consistent with Grade II diastolic dysfunction (pseudonormalization). Elevated left atrial pressure.  2. Right ventricular systolic function is normal. The right ventricular size is normal.  3. The mitral valve is degenerative. No evidence of mitral valve regurgitation. No evidence of mitral stenosis.  4. The aortic valve is normal in structure. Aortic valve regurgitation is not visualized. No aortic stenosis is present. FINDINGS  Left Ventricle: Left ventricular ejection fraction, by estimation, is 70 to 75%. The left ventricle has hyperdynamic  function. The left ventricle has no regional wall motion abnormalities. The left ventricular internal cavity size was normal in size. There is mild concentric left ventricular hypertrophy. Left ventricular diastolic parameters are consistent with Grade II diastolic dysfunction (pseudonormalization). Elevated left atrial pressure. Right Ventricle: The right ventricular size is normal. No increase in right ventricular wall thickness. Right ventricular  systolic function is normal. Left Atrium: Left atrial size was normal in size. Right Atrium: Right atrial size was normal in size. Pericardium: There is no evidence of pericardial effusion. The pericardial effusion is circumferential. Mitral Valve: The mitral valve is degenerative in appearance. Moderate mitral annular calcification. No evidence of mitral valve regurgitation. No evidence of mitral valve stenosis. Tricuspid Valve: The tricuspid valve is normal in structure. Tricuspid valve regurgitation is not demonstrated. Aortic Valve: The aortic valve is normal in structure. Aortic valve regurgitation is not visualized. No aortic stenosis is present. Pulmonic Valve: The pulmonic valve was grossly normal. Pulmonic valve regurgitation is not visualized. Aorta: The aortic root and ascending aorta are structurally normal, with no evidence of dilitation. IAS/Shunts: No atrial level shunt detected by color flow Doppler.  LEFT VENTRICLE PLAX 2D LVIDd:         3.60 cm  Diastology LVIDs:         2.10 cm  LV e' lateral:   5.11 cm/s LV PW:         1.30 cm  LV E/e' lateral: 21.1 LV IVS:        1.25 cm  LV e' medial:    5.87 cm/s LVOT diam:     1.90 cm  LV E/e' medial:  18.4 LV SV:         90 LV SV Index:   44 LVOT Area:     2.84 cm  RIGHT VENTRICLE             IVC RV Basal diam:  2.10 cm     IVC diam: 1.50 cm RV S prime:     11.70 cm/s TAPSE (M-mode): 2.0 cm LEFT ATRIUM             Index       RIGHT ATRIUM          Index LA diam:        3.20 cm 1.56 cm/m  RA Area:     8.02 cm  LA Vol (A2C):   55.7 ml 27.14 ml/m RA Volume:   12.50 ml 6.09 ml/m LA Vol (A4C):   35.3 ml 17.20 ml/m LA Biplane Vol: 45.9 ml 22.37 ml/m  AORTIC VALVE LVOT Vmax:   155.00 cm/s LVOT Vmean:  107.000 cm/s LVOT VTI:    0.317 m  AORTA Ao Root diam: 3.20 cm Ao Asc diam:  3.30 cm MITRAL VALVE MV Area (PHT): 2.69 cm     SHUNTS MV Decel Time: 282 msec     Systemic VTI:  0.32 m MV E velocity: 108.00 cm/s  Systemic Diam: 1.90 cm MV A velocity: 120.00 cm/s MV E/A ratio:  0.90 Mihai Croitoru MD Electronically signed by Thurmon Fair MD Signature Date/Time: 07/13/2020/3:55:58 PM    Final     Scheduled Meds: .  stroke: mapping our early stages of recovery book   Does not apply Once  . aspirin EC  81 mg Oral Daily  . atorvastatin  80 mg Oral Daily  . clopidogrel  75 mg Oral Daily  . enoxaparin (LOVENOX) injection  40 mg Subcutaneous Daily  . insulin aspart  0-15 Units Subcutaneous Q4H    Continuous Infusions:   LOS: 0 days     Briant Cedar, MD [facility]  If 7PM-7AM, please contact night-coverage [email or web address] 07/13/2020, 8:07 PM

## 2020-07-13 NOTE — Evaluation (Signed)
Physical Therapy Evaluation Patient Details Name: [name] [name] MRN: [id number] DOB: 1961-05-09 Today's Date: 07/13/2020   History of Present Illness  Pt is a 59 y/o female admitted secondary to inceased R sided weakness and numbness. Found to have L thalamic infarct. PMH includes DM, HTN, and migraines.   Clinical Impression  Pt admitted secondary to problem above with deficits below. Pt presenting with RUE and RLE weakness and decreased sensation throughout R extremities. Pt requiring min to mod A and HHA for gait this session. Pt with RLE instability as well. PTA, pt was very active and independent. Feel she would benefit from CIR level therapies at d/c to increase independence and safety with functional mobility. Pt with good family support at home and is very motivated to regain independence. Will continue to follow acutely.     Follow Up Recommendations CIR    Equipment Recommendations  Other (comment) (TBD)    Recommendations for Other Services       Precautions / Restrictions Precautions Precautions: Fall Restrictions Weight Bearing Restrictions: No      Mobility  Bed Mobility               General bed mobility comments: Sitting EOB upon entry   Transfers Overall transfer level: Needs assistance Equipment used: 1 person hand held assist Transfers: Sit to/from Stand Sit to Stand: Min assist         General transfer comment: Min A for steadying assist. REquired increased time to come to standing.   Ambulation/Gait Ambulation/Gait assistance: Min assist;Mod assist Gait Distance (Feet): 5 Feet Assistive device: 1 person hand held assist Gait Pattern/deviations: Step-through pattern;Decreased stride length;Decreased weight shift to right Gait velocity: Decreased   General Gait Details: Heavy reliance on UE support. Very short guarded steps. RLE instability noted and pt with difficulty advancing RLE. Min to mod A for steadying.   Stairs             Wheelchair Mobility    Modified Rankin (Stroke Patients Only)       Balance Overall balance assessment: Needs assistance Sitting-balance support: No upper extremity supported;Feet supported Sitting balance-Leahy Scale: Fair     Standing balance support: Single extremity supported;During functional activity Standing balance-Leahy Scale: Poor Standing balance comment: Reliant on at least 1 UE support and external support                             Pertinent Vitals/Pain Pain Assessment: No/denies pain    Home Living Family/patient expects to be discharged to:: Private residence Living Arrangements: Spouse/significant other Available Help at Discharge: Family;Available 24 hours/day Type of Home: House Home Access: Stairs to enter Entrance Stairs-Rails: Right;Left;Can reach both Entrance Stairs-Number of Steps: 5 Home Layout: One level Home Equipment: None      Prior Function Level of Independence: Independent         Comments: Very active. Likes to kayak and bike     [employer]        Extremity/Trunk Assessment   Upper Extremity Assessment Upper Extremity Assessment: Defer to OT evaluation    Lower Extremity Assessment Lower Extremity Assessment: RLE deficits/detail RLE Deficits / Details: R hip is "bad" at baseline, but pt reports it feels weaker. R hip flexors at 2/5. Otherwise grossly 3/5. Reports sensation is "dull" on that side RLE Sensation: decreased light touch RLE Coordination: decreased gross motor       Communication   Communication: No difficulties  Cognition Arousal/Alertness: Awake/alert Behavior During Therapy: WFL for tasks assessed/performed Overall Cognitive Status: Within Functional Limits for tasks assessed                                        General Comments      Exercises     Assessment/Plan    PT Assessment Patient needs continued PT services  PT Problem List Decreased  strength;Decreased balance;Decreased mobility;Decreased coordination;Decreased knowledge of use of DME;Impaired sensation       PT Treatment Interventions Gait training;DME instruction;Functional mobility training;Stair training;Therapeutic activities;Therapeutic exercise;Balance training;Patient/family education;Neuromuscular re-education    PT Goals (Current goals can be found in the Care Plan section)  Acute Rehab PT Goals Patient Stated Goal: to be independent again when her grandbaby gets here PT Goal Formulation: With patient Time For Goal Achievement: 07/27/20 Potential to Achieve Goals: Good    Frequency Min 4X/week   Barriers to discharge        Co-evaluation               AM-PAC PT "6 Clicks" Mobility  Outcome Measure Help needed turning from your back to your side while in a flat bed without using bedrails?: A Little Help needed moving from lying on your back to sitting on the side of a flat bed without using bedrails?: A Little Help needed moving to and from a bed to a chair (including a wheelchair)?: A Lot Help needed standing up from a chair using your arms (e.g., wheelchair or bedside chair)?: A Little Help needed to walk in hospital room?: A Lot Help needed climbing 3-5 steps with a railing? : Total 6 Click Score: 14    End of Session Equipment Utilized During Treatment: Gait belt Activity Tolerance: Patient tolerated treatment well Patient left: in bed;with call bell/phone within reach (sitting EOB in ED ) Nurse Communication: Mobility status PT Visit Diagnosis: Unsteadiness on feet (R26.81);Muscle weakness (generalized) (M62.81);Difficulty in walking, not elsewhere classified (R26.2)    Time: 4098-1191 PT Time Calculation (min) (ACUTE ONLY): 24 min   Charges:   PT Evaluation $PT Eval Moderate Complexity: 1 Mod PT Treatments $Therapeutic Activity: 8-22 mins        Cindee Salt, DPT  Acute Rehabilitation Services  Pager: [phone] Office: [phone]   [name] [name] 07/13/2020, 12:56 PM

## 2020-07-13 NOTE — Progress Notes (Signed)
Inpatient Rehab Admissions Coordinator Note:   Per therapy recommendations, pt was screened for CIR candidacy by Shann Medal, PT, DPT.  At this time we are recommending a CIR consult and I will place an order per our protocol.  Please contact me with questions.   Shann Medal, PT, DPT [phone] 07/13/20 2:06 PM

## 2020-07-13 NOTE — ED Notes (Signed)
Pt c/o increased numbness to right hand. No other neuro changes at this time. Admitting and neuro MDs aware.

## 2020-07-13 NOTE — ED Notes (Signed)
Report just gotten from Domingo Mend.

## 2020-07-13 NOTE — H&P (Signed)
History and Physical    [name] [name] ZOX:[id number] DOB: [birth year] DOA: 07/12/2020  PCP: Dois Davenport, MD  Patient coming from: Home  I have personally briefly reviewed patient's old medical records in [facility]  Chief Complaint: R sided weakness  HPI: [name] [name] is a 59 y.o. female with medical history significant of DM2, HTN, HLD.  Pt presents to ED with c/o R sided weakness and numbness and difficulty with speech expression.  Symptoms onset around 230 this afternoon.  Had headache that started off "like a migraine" but then got worse.  Hasnt had a migraine since menopause around age 73.   ED Course: MRI brain demonstrates acute ischemic stroke.   Review of Systems: As per HPI, otherwise all review of systems negative.  Past Medical History:  Diagnosis Date  . Chickenpox   . Complication of anesthesia    anxiety and panic  . Diabetes mellitus without complication (HCC)    diet controlled, no longer needs meds  . Elevated liver enzymes    ABD US done 08/2016: fatty liver  . Fatty liver    ABD US done 08/2016: fatty liver  . Hyperlipidemia    no longer needs meds  . Hypertension    pt states no longer needs meds  . Hypothyroidism   . OA (osteoarthritis) of hip    right  . Schizophrenia (HCC)   . Thyroid disease    no meds    Past Surgical History:  Procedure Laterality Date  . BREAST BIOPSY     needle bx more than 15 yrs benign, no scar visible   . LACERATION REPAIR N/A 12/13/2017   Procedure: Closure of nasal lacerations with one rotator graft and one advanced flap with skin graft, closure of left nasal laceration, closure of left ear.;  Surgeon: Suzanna Obey, MD;  Location: [facility];  Service: ENT;  Laterality: N/A;     reports that she quit smoking about 17 years ago. She has never used smokeless tobacco. She reports that she does not drink alcohol and does not use drugs.  Allergies  Allergen Reactions  . Citric Acid   . Dairy Aid  [Lactase]     Family History  Problem Relation Age of Onset  . Alcohol abuse Mother   . Arthritis Mother   . Diabetes Mother   . Hypertension Mother   . Hyperlipidemia Mother   . Alcohol abuse Father   . Arthritis Father   . Asthma Father   . Heart disease Father   . Hypertension Father   . Hyperlipidemia Father   . Alcohol abuse Brother   . Arthritis Brother   . Drug abuse Brother      Prior to Admission medications   Medication Sig Start Date End Date Taking? Authorizing Provider  amphetamine-dextroamphetamine (ADDERALL) 5 MG tablet Take 5 mg by mouth 2 (two) times daily with a meal.    [provider]  GLYCINE PO Take by mouth.    [provider]  ibuprofen (ADVIL,MOTRIN) 200 MG tablet Take 800 mg by mouth daily as needed.    [provider]  Omega-3 1000 MG CAPS Take by mouth.    [provider]  QUEtiapine (SEROQUEL) 100 MG tablet  09/17/18   [provider]  risperiDONE (RISPERDAL) 3 MG tablet  09/22/18   [provider]    Physical Exam: Vitals:   07/12/20 2115 07/12/20 2330 07/12/20 2345 07/13/20 0015  BP: (!) 137/52 (!) 141/75 133/73 134/71  Pulse: 77 78 72 77  Resp: 12 17 10 16   SpO2: 98% 95% 96% 97%    Constitutional: NAD, calm, comfortable Eyes: PERRL, lids and conjunctivae normal ENMT: Mucous membranes are moist. Posterior pharynx clear of any exudate or lesions.Normal dentition.  Neck: normal, supple, no masses, no thyromegaly Respiratory: clear to auscultation bilaterally, no wheezing, no crackles. Normal respiratory effort. No accessory muscle use.  Cardiovascular: Regular rate and rhythm, no murmurs / rubs / gallops. No extremity edema. 2+ pedal pulses. No carotid bruits.  Abdomen: no tenderness, no masses palpated. No hepatosplenomegaly. Bowel sounds positive.  Musculoskeletal: no clubbing / cyanosis. No joint deformity upper and lower extremities. Good ROM, no contractures. Normal muscle tone.    Skin: no rashes, lesions, ulcers. No induration Neurologic: 4/5 strength on R with mild R facial droop. Psychiatric: Normal judgment and insight. Alert and oriented x 3. Normal mood.    Labs on Admission: I have personally reviewed following labs and imaging studies  CBC: Recent Labs  Lab 07/12/20 2031  WBC 5.7  NEUTROABS 2.7  HGB 13.8  HCT 42.8  MCV 94.3  PLT 162   Basic Metabolic Panel: Recent Labs  Lab 07/12/20 2031  NA 132*  K 4.2  CL 101  CO2 21*  GLUCOSE 304*  BUN 17  CREATININE 0.72  CALCIUM 9.1   GFR: CrCl cannot be calculated (Unknown ideal weight.). Liver Function Tests: Recent Labs  Lab 07/12/20 2031  AST 30  ALT 47*  ALKPHOS 76  BILITOT 0.5  PROT 6.1*  ALBUMIN 3.3*   No results for input(s): LIPASE, AMYLASE in the last 168 hours. No results for input(s): AMMONIA in the last 168 hours. Coagulation Profile: Recent Labs  Lab 07/12/20 2031  INR 1.0   Cardiac Enzymes: No results for input(s): CKTOTAL, CKMB, CKMBINDEX, TROPONINI in the last 168 hours. BNP (last 3 results) No results for input(s): PROBNP in the last 8760 hours. HbA1C: No results for input(s): HGBA1C in the last 72 hours. CBG: Recent Labs  Lab 07/12/20 2318  GLUCAP 266*   Lipid Profile: No results for input(s): CHOL, HDL, LDLCALC, TRIG, CHOLHDL, LDLDIRECT in the last 72 hours. Thyroid Function Tests: No results for input(s): TSH, T4TOTAL, FREET4, T3FREE, THYROIDAB in the last 72 hours. Anemia Panel: No results for input(s): VITAMINB12, FOLATE, FERRITIN, TIBC, IRON, RETICCTPCT in the last 72 hours. Urine analysis: No results found for: COLORURINE, APPEARANCEUR, LABSPEC, PHURINE, GLUCOSEU, HGBUR, BILIRUBINUR, KETONESUR, PROTEINUR, UROBILINOGEN, NITRITE, LEUKOCYTESUR  Radiological Exams on Admission: CT Code Stroke CTA Head W/WO contrast  Result Date: 07/12/2020 CLINICAL DATA:  Right-sided weakness EXAM: CT ANGIOGRAPHY HEAD AND NECK CT PERFUSION BRAIN TECHNIQUE:  Multidetector CT imaging of the head and neck was performed using the standard protocol during bolus administration of intravenous contrast. Multiplanar CT image reconstructions and MIPs were obtained to evaluate the vascular anatomy. Carotid stenosis measurements (when applicable) are obtained utilizing NASCET criteria, using the distal internal carotid diameter as the denominator. Multiphase CT imaging of the brain was performed following IV bolus contrast injection. Subsequent parametric perfusion maps were calculated using RAPID software. CONTRAST:  OMNIPAQUE IOHEXOL 350 MG/ML SOLN COMPARISON:  Head CT same day FINDINGS: CTA NECK FINDINGS SKELETON: There is no bony spinal canal stenosis. No lytic or blastic lesion. OTHER NECK: Normal pharynx, larynx and major salivary glands. No cervical lymphadenopathy. Unremarkable thyroid gland. UPPER CHEST: Incompletely visualized 4 mm right upper lobe subpleural nodule. AORTIC ARCH: There is calcific atherosclerosis of the aortic arch. There is no aneurysm,  dissection or hemodynamically significant stenosis of the visualized portion of the aorta. Conventional 3 vessel aortic branching pattern. The visualized proximal subclavian arteries are widely patent. RIGHT CAROTID SYSTEM: No dissection, occlusion or aneurysm. Mild atherosclerotic calcification at the carotid bifurcation without hemodynamically significant stenosis. LEFT CAROTID SYSTEM: No dissection, occlusion or aneurysm. Mild atherosclerotic calcification at the carotid bifurcation without hemodynamically significant stenosis. VERTEBRAL ARTERIES: Right dominant configuration. Left vertebral artery is diffusely diminutive. Both origins are clearly patent. There is no dissection, occlusion or flow-limiting stenosis to the skull base (V1-V3 segments). CTA HEAD FINDINGS POSTERIOR CIRCULATION: --Vertebral arteries: Normal right. There is no opacified left V4 segment. --Inferior cerebellar arteries: Normal. --Basilar  artery: Diminutive --Superior cerebellar arteries: Normal. --Posterior cerebral arteries (PCA): Right fetal origin. Normal left with conventional origin. ANTERIOR CIRCULATION: --Intracranial internal carotid arteries: Normal. --Anterior cerebral arteries (ACA): Normal. Both A1 segments are present. Patent anterior communicating artery (a-comm). --Middle cerebral arteries (MCA): Normal. VENOUS SINUSES: As permitted by contrast timing, patent. ANATOMIC VARIANTS: Fetal origin of the right posterior cerebral artery. Review of the MIP images confirms the above findings. CT Brain Perfusion Findings: ASPECTS: 10 CBF (<30%) Volume: 0mL Perfusion (Tmax>6.0s) volume: 0mL Mismatch Volume: 0mL Infarction Location:None IMPRESSION: 1. No emergent large vessel occlusion or high-grade stenosis of the intracranial arteries. 2. Normal CT perfusion. 3. Diffusely diminutive left vertebral artery. V4 segment may terminate in a small left PICA or be occluded. 4. Aortic Atherosclerosis (ICD10-I70.0). Electronically Signed   By: Deatra Robinson M.D.   On: 07/12/2020 21:04   CT Code Stroke CTA Neck W/WO contrast  Result Date: 07/12/2020 CLINICAL DATA:  Right-sided weakness EXAM: CT ANGIOGRAPHY HEAD AND NECK CT PERFUSION BRAIN TECHNIQUE: Multidetector CT imaging of the head and neck was performed using the standard protocol during bolus administration of intravenous contrast. Multiplanar CT image reconstructions and MIPs were obtained to evaluate the vascular anatomy. Carotid stenosis measurements (when applicable) are obtained utilizing NASCET criteria, using the distal internal carotid diameter as the denominator. Multiphase CT imaging of the brain was performed following IV bolus contrast injection. Subsequent parametric perfusion maps were calculated using RAPID software. CONTRAST:  OMNIPAQUE IOHEXOL 350 MG/ML SOLN COMPARISON:  Head CT same day FINDINGS: CTA NECK FINDINGS SKELETON: There is no bony spinal canal stenosis. No lytic  or blastic lesion. OTHER NECK: Normal pharynx, larynx and major salivary glands. No cervical lymphadenopathy. Unremarkable thyroid gland. UPPER CHEST: Incompletely visualized 4 mm right upper lobe subpleural nodule. AORTIC ARCH: There is calcific atherosclerosis of the aortic arch. There is no aneurysm, dissection or hemodynamically significant stenosis of the visualized portion of the aorta. Conventional 3 vessel aortic branching pattern. The visualized proximal subclavian arteries are widely patent. RIGHT CAROTID SYSTEM: No dissection, occlusion or aneurysm. Mild atherosclerotic calcification at the carotid bifurcation without hemodynamically significant stenosis. LEFT CAROTID SYSTEM: No dissection, occlusion or aneurysm. Mild atherosclerotic calcification at the carotid bifurcation without hemodynamically significant stenosis. VERTEBRAL ARTERIES: Right dominant configuration. Left vertebral artery is diffusely diminutive. Both origins are clearly patent. There is no dissection, occlusion or flow-limiting stenosis to the skull base (V1-V3 segments). CTA HEAD FINDINGS POSTERIOR CIRCULATION: --Vertebral arteries: Normal right. There is no opacified left V4 segment. --Inferior cerebellar arteries: Normal. --Basilar artery: Diminutive --Superior cerebellar arteries: Normal. --Posterior cerebral arteries (PCA): Right fetal origin. Normal left with conventional origin. ANTERIOR CIRCULATION: --Intracranial internal carotid arteries: Normal. --Anterior cerebral arteries (ACA): Normal. Both A1 segments are present. Patent anterior communicating artery (a-comm). --Middle cerebral arteries (MCA): Normal. VENOUS SINUSES: As permitted by  contrast timing, patent. ANATOMIC VARIANTS: Fetal origin of the right posterior cerebral artery. Review of the MIP images confirms the above findings. CT Brain Perfusion Findings: ASPECTS: 10 CBF (<30%) Volume: 0mL Perfusion (Tmax>6.0s) volume: 0mL Mismatch Volume: 0mL Infarction Location:None  IMPRESSION: 1. No emergent large vessel occlusion or high-grade stenosis of the intracranial arteries. 2. Normal CT perfusion. 3. Diffusely diminutive left vertebral artery. V4 segment may terminate in a small left PICA or be occluded. 4. Aortic Atherosclerosis (ICD10-I70.0). Electronically Signed   By: Deatra Robinson M.D.   On: 07/12/2020 21:04   MR BRAIN WO CONTRAST  Result Date: 07/12/2020 CLINICAL DATA:  Headache with right-sided weakness EXAM: MRI HEAD WITHOUT CONTRAST TECHNIQUE: Multiplanar, multiecho pulse sequences of the brain and surrounding structures were obtained without intravenous contrast. COMPARISON:  CT perfusion 07/12/2020 FINDINGS: Brain: There is a small focus of abnormal diffusion restriction within the left thalamus. Normal white matter signal. Normal volume of CSF spaces. Single focus of chronic microhemorrhage in the right frontal white matter. Normal midline structures. Vascular: Normal flow voids. Skull and upper cervical spine: Normal marrow signal. Sinuses/Orbits: Normal Other: None IMPRESSION: Small acute/early subacute infarct within the left thalamus. No hemorrhage or mass effect. Electronically Signed   By: Deatra Robinson M.D.   On: 07/12/2020 22:15   CT Code Stroke Cerebral Perfusion with contrast  Result Date: 07/12/2020 CLINICAL DATA:  Right-sided weakness EXAM: CT ANGIOGRAPHY HEAD AND NECK CT PERFUSION BRAIN TECHNIQUE: Multidetector CT imaging of the head and neck was performed using the standard protocol during bolus administration of intravenous contrast. Multiplanar CT image reconstructions and MIPs were obtained to evaluate the vascular anatomy. Carotid stenosis measurements (when applicable) are obtained utilizing NASCET criteria, using the distal internal carotid diameter as the denominator. Multiphase CT imaging of the brain was performed following IV bolus contrast injection. Subsequent parametric perfusion maps were calculated using RAPID software. CONTRAST:   OMNIPAQUE IOHEXOL 350 MG/ML SOLN COMPARISON:  Head CT same day FINDINGS: CTA NECK FINDINGS SKELETON: There is no bony spinal canal stenosis. No lytic or blastic lesion. OTHER NECK: Normal pharynx, larynx and major salivary glands. No cervical lymphadenopathy. Unremarkable thyroid gland. UPPER CHEST: Incompletely visualized 4 mm right upper lobe subpleural nodule. AORTIC ARCH: There is calcific atherosclerosis of the aortic arch. There is no aneurysm, dissection or hemodynamically significant stenosis of the visualized portion of the aorta. Conventional 3 vessel aortic branching pattern. The visualized proximal subclavian arteries are widely patent. RIGHT CAROTID SYSTEM: No dissection, occlusion or aneurysm. Mild atherosclerotic calcification at the carotid bifurcation without hemodynamically significant stenosis. LEFT CAROTID SYSTEM: No dissection, occlusion or aneurysm. Mild atherosclerotic calcification at the carotid bifurcation without hemodynamically significant stenosis. VERTEBRAL ARTERIES: Right dominant configuration. Left vertebral artery is diffusely diminutive. Both origins are clearly patent. There is no dissection, occlusion or flow-limiting stenosis to the skull base (V1-V3 segments). CTA HEAD FINDINGS POSTERIOR CIRCULATION: --Vertebral arteries: Normal right. There is no opacified left V4 segment. --Inferior cerebellar arteries: Normal. --Basilar artery: Diminutive --Superior cerebellar arteries: Normal. --Posterior cerebral arteries (PCA): Right fetal origin. Normal left with conventional origin. ANTERIOR CIRCULATION: --Intracranial internal carotid arteries: Normal. --Anterior cerebral arteries (ACA): Normal. Both A1 segments are present. Patent anterior communicating artery (a-comm). --Middle cerebral arteries (MCA): Normal. VENOUS SINUSES: As permitted by contrast timing, patent. ANATOMIC VARIANTS: Fetal origin of the right posterior cerebral artery. Review of the MIP images confirms the above  findings. CT Brain Perfusion Findings: ASPECTS: 10 CBF (<30%) Volume: 0mL Perfusion (Tmax>6.0s) volume: 0mL Mismatch Volume: 0mL  Infarction Location:None IMPRESSION: 1. No emergent large vessel occlusion or high-grade stenosis of the intracranial arteries. 2. Normal CT perfusion. 3. Diffusely diminutive left vertebral artery. V4 segment may terminate in a small left PICA or be occluded. 4. Aortic Atherosclerosis (ICD10-I70.0). Electronically Signed   By: Deatra Robinson M.D.   On: 07/12/2020 21:04   CT HEAD CODE STROKE WO CONTRAST  Result Date: 07/12/2020 CLINICAL DATA:  Code stroke.  Right-sided weakness. EXAM: CT HEAD WITHOUT CONTRAST TECHNIQUE: Contiguous axial images were obtained from the base of the skull through the vertex without intravenous contrast. COMPARISON:  None. FINDINGS: Brain: Normal appearance without evidence of atrophy, old or acute infarction, mass lesion, hemorrhage, hydrocephalus or extra-axial collection. Vascular: No abnormal vascular finding. Skull: Normal Sinuses/Orbits: Clear/normal Other: None ASPECTS (Alberta Stroke Program Early CT Score) - Ganglionic level infarction (caudate, lentiform nuclei, internal capsule, insula, M1-M3 cortex): 7 - Supraganglionic infarction (M4-M6 cortex): 3 Total score (0-10 with 10 being normal): 10 IMPRESSION: 1. Normal head CT. 2. ASPECTS is 10. 3. These results were communicated to Dr. Amada Jupiter at 7:58 pmon 8/31/2021by text page via the [facility] messaging system. Electronically Signed   By: Paulina Fusi M.D.   On: 07/12/2020 19:59    EKG: Independently reviewed.  Assessment/Plan Principal Problem:   Acute ischemic stroke [facility]) Active Problems:   Type 2 diabetes mellitus without complication, without long-term current use of insulin (HCC)   Mixed hyperlipidemia    1. Acute ischemic stroke - 1. Stroke pathway 2. Neuro consult 3. ASA 325 4. 2d echo 5. A1C, FLP 6. Tele monitor 7. PT/OT/SLP 2. DM2 - 1. Sensitive SSI AC for now 2. A1C  pending 3. HLD - 1. FLP pending 2. Likely will need to start statin I expect, LDL was 191 back in 2018. 3. Will go ahead and order Lipitor 40  DVT prophylaxis: Lovenox Code Status: Full Family Communication: Husband at bedside Disposition Plan: Home after stroke work up [employer] called: neuro Admission status: Admit to inpatient  Severity of Illness: The appropriate patient status for this patient is INPATIENT. Inpatient status is judged to be reasonable and necessary in order to provide the required intensity of service to ensure the patient's safety. The patient's presenting symptoms, physical exam findings, and initial radiographic and laboratory data in the context of their chronic comorbidities is felt to place them at high risk for further clinical deterioration. Furthermore, it is not anticipated that the patient will be medically stable for discharge from the hospital within 2 midnights of admission. The following factors support the patient status of inpatient.   IP status for acute ischemic stroke demonstrated on MRI.  * I certify that at the point of admission it is my clinical judgment that the patient will require inpatient hospital care spanning beyond 2 midnights from the point of admission due to high intensity of service, high risk for further deterioration and high frequency of surveillance required.*    Stephinie Battisti M. DO [facility]  How to contact the [facility] Attending or Consulting provider 7A - 7P or covering provider during after hours 7P -7A, for this patient?  1. Check the care team in [facility] and look for a) attending/consulting [facility] provider listed and b) the [facility] team listed 2. Log into [email or web address]  Amion Physician Scheduling and messaging for groups and whole hospitals  On call and physician scheduling software for group practices, residents, hospitalists and other medical providers for call, clinic, rotation and shift schedules. On[employer] is a  hospital-wide  system for scheduling doctors and paging doctors on call. EasyPlot is for scientific plotting and data analysis.  [email or web address]  and use [place]'s universal password to access. If you do not have the password, please contact the hospital operator.  3. Locate the [facility] provider you are looking for under [facility] and page to a number that you can be directly reached. 4. If you still have difficulty reaching the provider, please page the [facility] (Director on Call) for the Hospitalists listed on amion for assistance.  07/13/2020, 12:22 AM

## 2020-07-13 NOTE — Progress Notes (Signed)
  Echocardiogram 2D Echocardiogram has been performed.  [name] [name] 07/13/2020, 11:51 AM

## 2020-07-13 NOTE — ED Notes (Signed)
Pt placed on hospital bed for comfort.  She reports chronic hip pain.  Pt was able to stand and pivot to chair w/ the help of her husband and RN.

## 2020-07-13 NOTE — Progress Notes (Signed)
STROKE TEAM PROGRESS NOTE   INTERVAL HISTORY Patient remains in the ED. I have personally reviewed history of present illness with the patient, electronic medical records and imaging films in PACS.  She presented with right-sided numbness, weakness and difficulty expressing herself and MRI scan of the brain shows a Left thalamic acute to subacute ischemic infarct.  CT angiogram showed no significant large vessel intra or extracranial stenosis.  2D echocardiogram and hemoglobin A1c are pending Vitals:   07/13/20 0500 07/13/20 0530 07/13/20 0645 07/13/20 0849  BP: (!) 129/55 132/60 (!) 142/74   Pulse: 72 67 69   Resp: 16 15 16    Temp:    98.2 F (36.8 C)  TempSrc:    Oral  SpO2:  92% 96%    CBC:  Recent Labs  Lab 07/12/20 2031  WBC 5.7  NEUTROABS 2.7  HGB 13.8  HCT 42.8  MCV 94.3  PLT 709   Basic Metabolic Panel:  Recent Labs  Lab 07/12/20 2031  NA 132*  K 4.2  CL 101  CO2 21*  GLUCOSE 304*  BUN 17  CREATININE 0.72  CALCIUM 9.1   Lipid Panel:  Recent Labs  Lab 07/13/20 0519  CHOL 274*  TRIG 232*  HDL 37*  CHOLHDL 7.4  VLDL 46*  LDLCALC 191*   HgbA1c: No results for input(s): HGBA1C in the last 168 hours. Urine Drug Screen: No results for input(s): LABOPIA, COCAINSCRNUR, LABBENZ, AMPHETMU, THCU, LABBARB in the last 168 hours.  Alcohol Level  Recent Labs  Lab 07/12/20 2031  ETH <10    IMAGING past 24 hours CT Code Stroke CTA Head W/WO contrast  Result Date: 07/12/2020 CLINICAL DATA:  Right-sided weakness EXAM: CT ANGIOGRAPHY HEAD AND NECK CT PERFUSION BRAIN TECHNIQUE: Multidetector CT imaging of the head and neck was performed using the standard protocol during bolus administration of intravenous contrast. Multiplanar CT image reconstructions and MIPs were obtained to evaluate the vascular anatomy. Carotid stenosis measurements (when applicable) are obtained utilizing NASCET criteria, using the distal internal carotid diameter as the denominator. Multiphase CT  imaging of the brain was performed following IV bolus contrast injection. Subsequent parametric perfusion maps were calculated using RAPID software. CONTRAST:  165mL OMNIPAQUE IOHEXOL 350 MG/ML SOLN COMPARISON:  Head CT same day FINDINGS: CTA NECK FINDINGS SKELETON: There is no bony spinal canal stenosis. No lytic or blastic lesion. OTHER NECK: Normal pharynx, larynx and major salivary glands. No cervical lymphadenopathy. Unremarkable thyroid gland. UPPER CHEST: Incompletely visualized 4 mm right upper lobe subpleural nodule. AORTIC ARCH: There is calcific atherosclerosis of the aortic arch. There is no aneurysm, dissection or hemodynamically significant stenosis of the visualized portion of the aorta. Conventional 3 vessel aortic branching pattern. The visualized proximal subclavian arteries are widely patent. RIGHT CAROTID SYSTEM: No dissection, occlusion or aneurysm. Mild atherosclerotic calcification at the carotid bifurcation without hemodynamically significant stenosis. LEFT CAROTID SYSTEM: No dissection, occlusion or aneurysm. Mild atherosclerotic calcification at the carotid bifurcation without hemodynamically significant stenosis. VERTEBRAL ARTERIES: Right dominant configuration. Left vertebral artery is diffusely diminutive. Both origins are clearly patent. There is no dissection, occlusion or flow-limiting stenosis to the skull base (V1-V3 segments). CTA HEAD FINDINGS POSTERIOR CIRCULATION: --Vertebral arteries: Normal right. There is no opacified left V4 segment. --Inferior cerebellar arteries: Normal. --Basilar artery: Diminutive --Superior cerebellar arteries: Normal. --Posterior cerebral arteries (PCA): Right fetal origin. Normal left with conventional origin. ANTERIOR CIRCULATION: --Intracranial internal carotid arteries: Normal. --Anterior cerebral arteries (ACA): Normal. Both A1 segments are present. Patent anterior communicating artery (  a-comm). --Middle cerebral arteries (MCA): Normal. VENOUS  SINUSES: As permitted by contrast timing, patent. ANATOMIC VARIANTS: Fetal origin of the right posterior cerebral artery. Review of the MIP images confirms the above findings. CT Brain Perfusion Findings: ASPECTS: 10 CBF (<30%) Volume: 20mL Perfusion (Tmax>6.0s) volume: 87mL Mismatch Volume: 13mL Infarction Location:None IMPRESSION: 1. No emergent large vessel occlusion or high-grade stenosis of the intracranial arteries. 2. Normal CT perfusion. 3. Diffusely diminutive left vertebral artery. V4 segment may terminate in a small left PICA or be occluded. 4. Aortic Atherosclerosis (ICD10-I70.0). Electronically Signed   By: Ulyses Jarred M.D.   On: 07/12/2020 21:04   DG Chest 2 View  Result Date: 07/13/2020 CLINICAL DATA:  Left thalamic infarct EXAM: CHEST - 2 VIEW COMPARISON:  None. FINDINGS: Frontal and lateral views of the chest demonstrate an unremarkable cardiac silhouette. No airspace disease, effusion, or pneumothorax. No acute bony abnormalities. IMPRESSION: 1. No acute intrathoracic process. Electronically Signed   By: Randa Ngo M.D.   On: 07/13/2020 00:50   CT Code Stroke CTA Neck W/WO contrast  Result Date: 07/12/2020 CLINICAL DATA:  Right-sided weakness EXAM: CT ANGIOGRAPHY HEAD AND NECK CT PERFUSION BRAIN TECHNIQUE: Multidetector CT imaging of the head and neck was performed using the standard protocol during bolus administration of intravenous contrast. Multiplanar CT image reconstructions and MIPs were obtained to evaluate the vascular anatomy. Carotid stenosis measurements (when applicable) are obtained utilizing NASCET criteria, using the distal internal carotid diameter as the denominator. Multiphase CT imaging of the brain was performed following IV bolus contrast injection. Subsequent parametric perfusion maps were calculated using RAPID software. CONTRAST:  128mL OMNIPAQUE IOHEXOL 350 MG/ML SOLN COMPARISON:  Head CT same day FINDINGS: CTA NECK FINDINGS SKELETON: There is no bony spinal  canal stenosis. No lytic or blastic lesion. OTHER NECK: Normal pharynx, larynx and major salivary glands. No cervical lymphadenopathy. Unremarkable thyroid gland. UPPER CHEST: Incompletely visualized 4 mm right upper lobe subpleural nodule. AORTIC ARCH: There is calcific atherosclerosis of the aortic arch. There is no aneurysm, dissection or hemodynamically significant stenosis of the visualized portion of the aorta. Conventional 3 vessel aortic branching pattern. The visualized proximal subclavian arteries are widely patent. RIGHT CAROTID SYSTEM: No dissection, occlusion or aneurysm. Mild atherosclerotic calcification at the carotid bifurcation without hemodynamically significant stenosis. LEFT CAROTID SYSTEM: No dissection, occlusion or aneurysm. Mild atherosclerotic calcification at the carotid bifurcation without hemodynamically significant stenosis. VERTEBRAL ARTERIES: Right dominant configuration. Left vertebral artery is diffusely diminutive. Both origins are clearly patent. There is no dissection, occlusion or flow-limiting stenosis to the skull base (V1-V3 segments). CTA HEAD FINDINGS POSTERIOR CIRCULATION: --Vertebral arteries: Normal right. There is no opacified left V4 segment. --Inferior cerebellar arteries: Normal. --Basilar artery: Diminutive --Superior cerebellar arteries: Normal. --Posterior cerebral arteries (PCA): Right fetal origin. Normal left with conventional origin. ANTERIOR CIRCULATION: --Intracranial internal carotid arteries: Normal. --Anterior cerebral arteries (ACA): Normal. Both A1 segments are present. Patent anterior communicating artery (a-comm). --Middle cerebral arteries (MCA): Normal. VENOUS SINUSES: As permitted by contrast timing, patent. ANATOMIC VARIANTS: Fetal origin of the right posterior cerebral artery. Review of the MIP images confirms the above findings. CT Brain Perfusion Findings: ASPECTS: 10 CBF (<30%) Volume: 4mL Perfusion (Tmax>6.0s) volume: 36mL Mismatch Volume: 55mL  Infarction Location:None IMPRESSION: 1. No emergent large vessel occlusion or high-grade stenosis of the intracranial arteries. 2. Normal CT perfusion. 3. Diffusely diminutive left vertebral artery. V4 segment may terminate in a small left PICA or be occluded. 4. Aortic Atherosclerosis (ICD10-I70.0). Electronically Signed   By:  Ulyses Jarred M.D.   On: 07/12/2020 21:04   MR BRAIN WO CONTRAST  Result Date: 07/12/2020 CLINICAL DATA:  Headache with right-sided weakness EXAM: MRI HEAD WITHOUT CONTRAST TECHNIQUE: Multiplanar, multiecho pulse sequences of the brain and surrounding structures were obtained without intravenous contrast. COMPARISON:  CT perfusion 07/12/2020 FINDINGS: Brain: There is a small focus of abnormal diffusion restriction within the left thalamus. Normal white matter signal. Normal volume of CSF spaces. Single focus of chronic microhemorrhage in the right frontal white matter. Normal midline structures. Vascular: Normal flow voids. Skull and upper cervical spine: Normal marrow signal. Sinuses/Orbits: Normal Other: None IMPRESSION: Small acute/early subacute infarct within the left thalamus. No hemorrhage or mass effect. Electronically Signed   By: Ulyses Jarred M.D.   On: 07/12/2020 22:15   CT Code Stroke Cerebral Perfusion with contrast  Result Date: 07/12/2020 CLINICAL DATA:  Right-sided weakness EXAM: CT ANGIOGRAPHY HEAD AND NECK CT PERFUSION BRAIN TECHNIQUE: Multidetector CT imaging of the head and neck was performed using the standard protocol during bolus administration of intravenous contrast. Multiplanar CT image reconstructions and MIPs were obtained to evaluate the vascular anatomy. Carotid stenosis measurements (when applicable) are obtained utilizing NASCET criteria, using the distal internal carotid diameter as the denominator. Multiphase CT imaging of the brain was performed following IV bolus contrast injection. Subsequent parametric perfusion maps were calculated using RAPID  software. CONTRAST:  125mL OMNIPAQUE IOHEXOL 350 MG/ML SOLN COMPARISON:  Head CT same day FINDINGS: CTA NECK FINDINGS SKELETON: There is no bony spinal canal stenosis. No lytic or blastic lesion. OTHER NECK: Normal pharynx, larynx and major salivary glands. No cervical lymphadenopathy. Unremarkable thyroid gland. UPPER CHEST: Incompletely visualized 4 mm right upper lobe subpleural nodule. AORTIC ARCH: There is calcific atherosclerosis of the aortic arch. There is no aneurysm, dissection or hemodynamically significant stenosis of the visualized portion of the aorta. Conventional 3 vessel aortic branching pattern. The visualized proximal subclavian arteries are widely patent. RIGHT CAROTID SYSTEM: No dissection, occlusion or aneurysm. Mild atherosclerotic calcification at the carotid bifurcation without hemodynamically significant stenosis. LEFT CAROTID SYSTEM: No dissection, occlusion or aneurysm. Mild atherosclerotic calcification at the carotid bifurcation without hemodynamically significant stenosis. VERTEBRAL ARTERIES: Right dominant configuration. Left vertebral artery is diffusely diminutive. Both origins are clearly patent. There is no dissection, occlusion or flow-limiting stenosis to the skull base (V1-V3 segments). CTA HEAD FINDINGS POSTERIOR CIRCULATION: --Vertebral arteries: Normal right. There is no opacified left V4 segment. --Inferior cerebellar arteries: Normal. --Basilar artery: Diminutive --Superior cerebellar arteries: Normal. --Posterior cerebral arteries (PCA): Right fetal origin. Normal left with conventional origin. ANTERIOR CIRCULATION: --Intracranial internal carotid arteries: Normal. --Anterior cerebral arteries (ACA): Normal. Both A1 segments are present. Patent anterior communicating artery (a-comm). --Middle cerebral arteries (MCA): Normal. VENOUS SINUSES: As permitted by contrast timing, patent. ANATOMIC VARIANTS: Fetal origin of the right posterior cerebral artery. Review of the MIP  images confirms the above findings. CT Brain Perfusion Findings: ASPECTS: 10 CBF (<30%) Volume: 98mL Perfusion (Tmax>6.0s) volume: 54mL Mismatch Volume: 43mL Infarction Location:None IMPRESSION: 1. No emergent large vessel occlusion or high-grade stenosis of the intracranial arteries. 2. Normal CT perfusion. 3. Diffusely diminutive left vertebral artery. V4 segment may terminate in a small left PICA or be occluded. 4. Aortic Atherosclerosis (ICD10-I70.0). Electronically Signed   By: Ulyses Jarred M.D.   On: 07/12/2020 21:04   CT HEAD CODE STROKE WO CONTRAST  Result Date: 07/12/2020 CLINICAL DATA:  Code stroke.  Right-sided weakness. EXAM: CT HEAD WITHOUT CONTRAST TECHNIQUE: Contiguous axial images were obtained  from the base of the skull through the vertex without intravenous contrast. COMPARISON:  None. FINDINGS: Brain: Normal appearance without evidence of atrophy, old or acute infarction, mass lesion, hemorrhage, hydrocephalus or extra-axial collection. Vascular: No abnormal vascular finding. Skull: Normal Sinuses/Orbits: Clear/normal Other: None ASPECTS ([place] Stroke Program Early CT Score) - Ganglionic level infarction (caudate, lentiform nuclei, internal capsule, insula, M1-M3 cortex): 7 - Supraganglionic infarction (M4-M6 cortex): 3 Total score (0-10 with 10 being normal): 10 IMPRESSION: 1. Normal head CT. 2. ASPECTS is 10. 3. These results were communicated to Dr. Leonel Ramsay at 7:58 pmon 8/31/2021by text page via the [facility] messaging system. Electronically Signed   By: Nelson Chimes M.D.   On: 07/12/2020 19:59    PHYSICAL EXAM Pleasant mildly obese middle-aged lady not in distress. . Afebrile. Head is nontraumatic. Neck is supple without bruit.    Cardiac exam no murmur or gallop. Lungs are clear to auscultation. Distal pulses are well felt. Neurological Exam : She is awake alert oriented to time place and person.  Mild dysarthria and nonfluent speech with occasional word finding difficulties but  good comprehension naming and repetition.  Extraocular movements are full range without nystagmus.  She blinks to threat bilaterally.  Mild right lower facial weakness.  Tongue midline.  Motor system exam shows no upper or lower extremity drift but mild weakness of right grip and intrinsic hand muscles and orbits left over right upper extremity.  Mild weakness of right hip flexors and ankle dorsiflexors only.  Subjective diminished touch sensation in right lower face and hemibody.  Deep tendon reflexes symmetric.  Plantars downgoing.  Gait not tested. NIH stroke scale 3.  Premorbid baseline modified Rankin scale 0 ASSESSMENT/PLAN [name] is a 60 y.o. female with history of migraine with aura presenting with right-sided weakness and numbness and some difficulty with expressing herself w/ HA  Stroke:   L thalamic lacunar infarct secondary to small vessel disease source  Code Stroke CT head No acute abnormality. ASPECTS 10.     CTA head & neck no ELVO. 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 Aortic atherosclerosis.   CT perfusion negative  MRI  Small subacute L thalamic lacune   2D Echo pending   LDL 191  HgbA1c pending   VTE prophylaxis - Lovenox 40 mg sq daily   No antithrombotic prior to admission, now on aspirin 325 mg daily. Decrease aspirin to 81 and add plavix 75 mg daily. Continue DAPT x 3 weeks then aspirin alone  Therapy recommendations:  pending   Disposition:  pending   Hypertension  Stable . Permissive hypertension (OK if < 220/120) but gradually normalize in 5-7 days . Long-term BP goal normotensive  Hyperlipidemia  Home meds:  No statin  Now on lipitor 40 -> increase to 80    LDL 191, goal < 70  Monitor LFTs given hx fatty liver (AST/ALT 30/47)  Continue statin at discharge  Diabetes type II Uncontrolled  HgbA1c pending goal < 7.0  CBGs Recent Labs    07/12/20 2318 07/13/20 0559 07/13/20 0801  GLUCAP 266* 222*  198*      SSI    Other Stroke Risk Factors  Former Cigarette smoker, quit 17 yrs ago  Obesity, There is no height or weight on file to calculate BMI., recommend weight loss, diet and exercise as appropriate   Hx Migraines, last one 20 yrs ago  Hospital day # 0 She presented with right-sided weakness and numbness likely  due to left thalamic infarct from small vessel disease.  Recommend continue ongoing stroke work-up and aspirin Plavix for 3 weeks followed by aspirin alone and aggressive risk factor modification.  Start full dose statin for elevated LDL.  Patient also appears to be at risk for sleep apnea and may benefit with consideration for possible participation of sleep smart study for stroke prevention.  She will be given information to review and decide.  Greater than 50% time during this 35-minute visit was spent in counseling and coordination of care about her stroke and discussion about stroke prevention treatment and sleep apnea and answering questions [name] Contras, MD To contact Stroke Continuity provider, please refer to [email or web address] After hours, contact General Neurology

## 2020-07-13 NOTE — ED Notes (Addendum)
Dietary contacted about pt receiving lunch tray with dairy when she is lactose intolerant. Diet order changed by service response and chef salad with no dairy/ no dressing ordered at this time

## 2020-07-14 DIAGNOSIS — E782 Mixed hyperlipidemia: Secondary | ICD-10-CM

## 2020-07-14 DIAGNOSIS — E119 Type 2 diabetes mellitus without complications: Secondary | ICD-10-CM

## 2020-07-14 DIAGNOSIS — F259 Schizoaffective disorder, unspecified: Secondary | ICD-10-CM

## 2020-07-14 LAB — CBC WITH DIFFERENTIAL/PLATELET
Abs Immature Granulocytes: 0.02 10*3/uL (ref 0.00–0.07)
Basophils Absolute: 0 10*3/uL (ref 0.0–0.1)
Basophils Relative: 1 %
Eosinophils Absolute: 0.1 10*3/uL (ref 0.0–0.5)
Eosinophils Relative: 1 %
HCT: 46.5 % — ABNORMAL HIGH (ref 36.0–46.0)
Hemoglobin: 15.2 g/dL — ABNORMAL HIGH (ref 12.0–15.0)
Immature Granulocytes: 0 %
Lymphocytes Relative: 47 %
Lymphs Abs: 3.4 10*3/uL (ref 0.7–4.0)
MCH: 30.1 pg (ref 26.0–34.0)
MCHC: 32.7 g/dL (ref 30.0–36.0)
MCV: 92.1 fL (ref 80.0–100.0)
Monocytes Absolute: 0.4 10*3/uL (ref 0.1–1.0)
Monocytes Relative: 6 %
Neutro Abs: 3.3 10*3/uL (ref 1.7–7.7)
Neutrophils Relative %: 45 %
Platelets: 206 10*3/uL (ref 150–400)
RBC: 5.05 MIL/uL (ref 3.87–5.11)
RDW: 11.9 % (ref 11.5–15.5)
WBC: 7.2 10*3/uL (ref 4.0–10.5)
nRBC: 0 % (ref 0.0–0.2)

## 2020-07-14 LAB — BASIC METABOLIC PANEL
Anion gap: 10 (ref 5–15)
BUN: 11 mg/dL (ref 6–20)
CO2: 21 mmol/L — ABNORMAL LOW (ref 22–32)
Calcium: 8.9 mg/dL (ref 8.9–10.3)
Chloride: 103 mmol/L (ref 98–111)
Creatinine, Ser: 0.58 mg/dL (ref 0.44–1.00)
GFR calc Af Amer: >60 mL/min (ref >60)
GFR calc non Af Amer: >60 mL/min (ref >60)
Glucose, Bld: 191 mg/dL — ABNORMAL HIGH (ref 70–99)
Potassium: 3.5 mmol/L (ref 3.5–5.1)
Sodium: 134 mmol/L — ABNORMAL LOW (ref 135–145)

## 2020-07-14 LAB — HEMOGLOBIN A1C
Hgb A1c MFr Bld: 11.6 % — ABNORMAL HIGH (ref 4.8–5.6)
Mean Plasma Glucose: 286.22 mg/dL

## 2020-07-14 LAB — GLUCOSE, CAPILLARY
Glucose-Capillary: 208 mg/dL — ABNORMAL HIGH (ref 70–99)
Glucose-Capillary: 316 mg/dL — ABNORMAL HIGH (ref 70–99)
Glucose-Capillary: 235 mg/dL — ABNORMAL HIGH (ref 70–99)
Glucose-Capillary: 173 mg/dL — ABNORMAL HIGH (ref 70–99)
Glucose-Capillary: 289 mg/dL — ABNORMAL HIGH (ref 70–99)

## 2020-07-14 MED ORDER — INSULIN STARTER KIT- PEN NEEDLES (ENGLISH)
1.0000 | Freq: Once | Status: AC
  Administered 2020-07-16: 1
  Filled 2020-07-14: qty 1

## 2020-07-14 MED ORDER — INSULIN ASPART 100 UNIT/ML ~~LOC~~ SOLN
0.0000 [IU] | Freq: Every day | SUBCUTANEOUS | Status: DC
  Administered 2020-07-14: 3 [IU] via SUBCUTANEOUS

## 2020-07-14 MED ORDER — INSULIN ASPART 100 UNIT/ML ~~LOC~~ SOLN
0.0000 [IU] | Freq: Three times a day (TID) | SUBCUTANEOUS | Status: DC
  Administered 2020-07-14 – 2020-07-15 (×2): 3 [IU] via SUBCUTANEOUS
  Administered 2020-07-15: 5 [IU] via SUBCUTANEOUS
  Administered 2020-07-15: 11 [IU] via SUBCUTANEOUS
  Administered 2020-07-16: 3 [IU] via SUBCUTANEOUS
  Administered 2020-07-16 (×2): 5 [IU] via SUBCUTANEOUS
  Administered 2020-07-17 (×2): 2 [IU] via SUBCUTANEOUS
  Administered 2020-07-17: 3 [IU] via SUBCUTANEOUS
  Administered 2020-07-18: 2 [IU] via SUBCUTANEOUS
  Administered 2020-07-18 (×2): 3 [IU] via SUBCUTANEOUS
  Administered 2020-07-19: 2 [IU] via SUBCUTANEOUS
  Administered 2020-07-19: 3 [IU] via SUBCUTANEOUS

## 2020-07-14 MED ORDER — LIVING WELL WITH DIABETES BOOK
Freq: Once | Status: AC
  Filled 2020-07-14 (×2): qty 1

## 2020-07-14 NOTE — Progress Notes (Addendum)
PROGRESS NOTE  [name] [name] ZOX:[id number] DOB: [birth year] DOA: 07/12/2020 PCP: Dois Davenport, MD  HPI/Recap of past 24 hours: HPI from Dr Barnet Pall Pickrel is a 59 y.o. female with medical history significant of DM2, HTN, HLD.  Pt presents to ED with c/o R sided weakness and numbness and difficulty with speech expression.  Had headache that started off "like a migraine" but then got worse.  Hasnt had a migraine since menopause around age 54. ED Course: MRI brain demonstrates acute ischemic stroke.  Neurology consulted.  Patient admitted for further management.      Today, had an extensive discussion with patient about her overall medical condition.  Denies any new complaints, reports poor sleep in the hospital.  Patient tested positive for sleep apnea, in the sleep smart study.   Assessment/Plan: Principal Problem:   Acute ischemic stroke [facility]) Active Problems:   Type 2 diabetes mellitus without complication, without long-term current use of insulin (HCC)   Mixed hyperlipidemia   Schizoaffective disorder (HCC)   Acute to subacute ischemic L thalamic infarct MRI showed above CT angiogram showed no significant large vessel intracranial or extracranial stenosis Echo EF of 70 to 75%, no regional wall motion abnormality, grade 2 diastolic dysfunction.  No atrial level shunt detected by color-flow Doppler LDL 191, A1c pending Neurology on board, start aspirin 81, Plavix 75 mg daily for 3 weeks and then aspirin alone PT/OT/SLP  Hypertension Permissive hypertension  Hyperlipidemia LDL 191 Start Lipitor 80 mg daily Monitor LFTs given history of fatty liver  Diabetes mellitus type 2 A1c pending SSI, Accu-Cheks, hypoglycemic protocol  Marijuana use UDS showed marijuana  OSA Patient tested positive for sleep apnea on the overnight monitor, plan for CPAP titration tonight  Schizoaffective disorder Stable, managed without medications        Malnutrition Type:      Malnutrition Characteristics:      Nutrition Interventions:       Estimated body mass index is 40.74 kg/m as calculated from the following:   Height as of 09/30/18: 5\' 3"  (1.6 m).   Weight as of 09/30/18: 104.3 kg.     Code Status: Full  Family Communication: Discussed with patient  Disposition Plan: Status is: Inpatient  Remains inpatient appropriate because:Inpatient level of care appropriate due to severity of illness   Dispo: The patient is from: Home              Anticipated d/c is to: CIR              Anticipated d/c date is: 1 day              Patient currently is not medically stable to d/c.    Consultants:  Neurology  Procedures:  None  Antimicrobials:  None  DVT prophylaxis: Lovenox   Objective: Vitals:   07/14/20 0000 07/14/20 0400 07/14/20 0809 07/14/20 1159  BP: (!) 147/81 140/80 137/86 (!) 157/89  Pulse: 67 71 68 67  Resp: 16 16 16 16   Temp: 98.1 F (36.7 C) 98 F (36.7 C) 98.3 F (36.8 C) 98 F (36.7 C)  TempSrc: Oral Oral Oral Oral  SpO2: 98% 98% 97% 96%   No intake or output data in the 24 hours ending 07/14/20 1638 There were no vitals filed for this visit.  Exam:  General: NAD   Cardiovascular: S1, S2 present  Respiratory: CTAB  Abdomen: Soft, nontender, nondistended, bowel sounds present  Musculoskeletal: No bilateral pedal edema noted  Skin:  Normal  Psychiatry: Normal mood  Neurology: 4/5 strength on right upper and lower extremity, no sensory deficit    Data Reviewed: CBC: Recent Labs  Lab 07/12/20 2031 07/14/20 1325  WBC 5.7 7.2  NEUTROABS 2.7 3.3  HGB 13.8 15.2*  HCT 42.8 46.5*  MCV 94.3 92.1  PLT 162 206   Basic Metabolic Panel: Recent Labs  Lab 07/12/20 2031 07/14/20 0429  NA 132* 134*  K 4.2 3.5  CL 101 103  CO2 21* 21*  GLUCOSE 304* 191*  BUN 17 11  CREATININE 0.72 0.58  CALCIUM 9.1 8.9   GFR: CrCl cannot be calculated (Unknown ideal  weight.). Liver Function Tests: Recent Labs  Lab 07/12/20 2031  AST 30  ALT 47*  ALKPHOS 76  BILITOT 0.5  PROT 6.1*  ALBUMIN 3.3*   No results for input(s): LIPASE, AMYLASE in the last 168 hours. No results for input(s): AMMONIA in the last 168 hours. Coagulation Profile: Recent Labs  Lab 07/12/20 2031  INR 1.0   Cardiac Enzymes: No results for input(s): CKTOTAL, CKMB, CKMBINDEX, TROPONINI in the last 168 hours. BNP (last 3 results) No results for input(s): PROBNP in the last 8760 hours. HbA1C: No results for input(s): HGBA1C in the last 72 hours. CBG: Recent Labs  Lab 07/13/20 2000 07/13/20 2338 07/14/20 0406 07/14/20 0802 07/14/20 1108  GLUCAP 264* 175* 208* 316* 235*   Lipid Profile: Recent Labs    07/13/20 0519  CHOL 274*  HDL 37*  LDLCALC 191*  TRIG 232*  CHOLHDL 7.4   Thyroid Function Tests: No results for input(s): TSH, T4TOTAL, FREET4, T3FREE, THYROIDAB in the last 72 hours. Anemia Panel: No results for input(s): VITAMINB12, FOLATE, FERRITIN, TIBC, IRON, RETICCTPCT in the last 72 hours. Urine analysis:    Component Value Date/Time   COLORURINE YELLOW 07/13/2020 1555   APPEARANCEUR CLEAR 07/13/2020 1555   LABSPEC 1.023 07/13/2020 1555   PHURINE 5.0 07/13/2020 1555   GLUCOSEU >=500 (A) 07/13/2020 1555   HGBUR NEGATIVE 07/13/2020 1555   BILIRUBINUR NEGATIVE 07/13/2020 1555   KETONESUR NEGATIVE 07/13/2020 1555   PROTEINUR NEGATIVE 07/13/2020 1555   NITRITE NEGATIVE 07/13/2020 1555   LEUKOCYTESUR NEGATIVE 07/13/2020 1555   Sepsis Labs: @LABRCNTIP (procalcitonin:4,lacticidven:4)  ) Recent Results (from the past 240 hour(s))  SARS Coronavirus 2 by RT PCR (hospital order, performed in [facility] hospital lab) Nasopharyngeal Nasopharyngeal Swab     Status: None   Collection Time: 07/13/20 12:17 AM   Specimen: Nasopharyngeal Swab  Result Value Ref Range Status   SARS Coronavirus 2 NEGATIVE NEGATIVE Final    Comment: (NOTE) SARS-CoV-2 target  nucleic acids are NOT DETECTED.  The SARS-CoV-2 RNA is generally detectable in upper and lower respiratory specimens during the acute phase of infection. The lowest concentration of SARS-CoV-2 viral copies this assay can detect is 250 copies / mL. A negative result does not preclude SARS-CoV-2 infection and should not be used as the sole basis for treatment or other patient management decisions.  A negative result may occur with improper specimen collection / handling, submission of specimen other than nasopharyngeal swab, presence of viral mutation(s) within the areas targeted by this assay, and inadequate number of viral copies (<250 copies / mL). A negative result must be combined with clinical observations, patient history, and epidemiological information.  Fact Sheet for Patients:   BoilerBrush.com.cy  Fact Sheet for Healthcare Providers: https://pope.com/  This test is not yet approved or  cleared by the [place] FDA and has been authorized for detection  and/or diagnosis of SARS-CoV-2 by FDA under an Emergency Use Authorization (EUA).  This EUA will remain in effect (meaning this test can be used) for the duration of the COVID-19 declaration under Section 564(b)(1) of the Act, 21 U.S.C. section 360bbb-3(b)(1), unless the authorization is terminated or revoked sooner.  Performed at [facility], [address], [place], [place] [address]       Studies: No results found.  Scheduled Meds: .  stroke: mapping our early stages of recovery book   Does not apply Once  . aspirin EC  81 mg Oral Daily  . atorvastatin  80 mg Oral Daily  . clopidogrel  75 mg Oral Daily  . enoxaparin (LOVENOX) injection  40 mg Subcutaneous Daily  . insulin aspart  0-15 Units Subcutaneous Q4H  . insulin starter kit- pen needles  1 kit Other Once  . living well with diabetes book   Does not apply Once    Continuous Infusions:   LOS: 1  day     Briant Cedar, MD [facility]  If 7PM-7AM, please contact night-coverage [email or web address] 07/14/2020, 4:38 PM

## 2020-07-14 NOTE — Progress Notes (Signed)
Physical Therapy Treatment Patient Details Name: [name] [name] MRN: [id number] DOB: [birth year] Today's Date: 07/14/2020    History of Present Illness Pt is a 59 y/o female admitted secondary to inceased R sided weakness and numbness. Found to have L thalamic infarct. PMH includes DM, HTN, and migraines.     PT Comments    The pt is making good progress with therapy at this time, as she was able to progress to hallway ambulation and learn a series of exercises for LE strength. Despite the progress in ambulation distance, the pt continues to have significant deficits in RLE strength, coordination, and sensation that limit her ability to safely and efficiently mobilize. She was able to increase ambulation distance, but fatigues after ~45 ft with onset of shaking, R knee buckling, and general instability. The pt continues to be an excellent candidate for intensive therapies given her high level of motivation, family support, and prior level of function and independence.     Follow Up Recommendations  CIR     Equipment Recommendations  Other (comment) (defer to post acute)    Recommendations for Other Services       Precautions / Restrictions Precautions Precautions: Fall Restrictions Weight Bearing Restrictions: No    Mobility  Bed Mobility Overal bed mobility: Needs Assistance             General bed mobility comments: pt OOB in recliner upon arrival of PT  Transfers Overall transfer level: Needs assistance Equipment used: Rolling walker (2 wheeled) Transfers: Sit to/from Stand Sit to Stand: Min assist         General transfer comment: minA to steady, extra time to power up  Ambulation/Gait Ambulation/Gait assistance: Min assist Gait Distance (Feet): 45 Feet (+25 ft) Assistive device: Rolling walker (2 wheeled) Gait Pattern/deviations: Step-through pattern;Decreased stride length;Decreased weight shift to right;Decreased dorsiflexion - right Gait velocity:  0.2 m/s Gait velocity interpretation: <1.31 ft/sec, indicative of household ambulator General Gait Details: Heavy reliance on UE support. Very short guarded steps. able to increase DF with RLE when cued, onset of RLE buckling, shaking, and general instability with fatigue    Modified Rankin (Stroke Patients Only) Modified Rankin (Stroke Patients Only) Pre-Morbid Rankin Score: No symptoms Modified Rankin: Moderately severe disability     Balance Overall balance assessment: Needs assistance Sitting-balance support: No upper extremity supported;Feet supported Sitting balance-Leahy Scale: Fair     Standing balance support: Single extremity supported;During functional activity Standing balance-Leahy Scale: Poor Standing balance comment: relies on at least 1 UE and external support                             Cognition Arousal/Alertness: Awake/alert Behavior During Therapy: WFL for tasks assessed/performed Overall Cognitive Status: Within Functional Limits for tasks assessed                                        Exercises General Exercises - Lower Extremity Long Arc Quad: AROM;10 reps;Seated;Right (plus x5 with LLE on RLE for resistance) Heel Slides: AROM;10 reps;Right;Seated Toe Raises: AROM;Other reps (comment);Seated;Right (for 30 sec) Heel Raises: Both;Strengthening;10 reps;Standing    General Comments General comments (skin integrity, edema, etc.): very motivated      Pertinent Vitals/Pain Pain Assessment: No/denies pain           PT Goals (current goals can now be found in the  care plan section) Acute Rehab PT Goals Patient Stated Goal: to be independent again when her grandbaby gets here PT Goal Formulation: With patient Time For Goal Achievement: 07/27/20 Potential to Achieve Goals: Good Progress towards PT goals: Progressing toward goals    Frequency    Min 4X/week      PT Plan Current plan remains appropriate        AM-PAC PT "6 Clicks" Mobility   Outcome Measure  Help needed turning from your back to your side while in a flat bed without using bedrails?: A Little Help needed moving from lying on your back to sitting on the side of a flat bed without using bedrails?: A Little Help needed moving to and from a bed to a chair (including a wheelchair)?: A Lot Help needed standing up from a chair using your arms (e.g., wheelchair or bedside chair)?: A Little Help needed to walk in hospital room?: A Lot Help needed climbing 3-5 steps with a railing? : Total 6 Click Score: 14    End of Session Equipment Utilized During Treatment: Gait belt Activity Tolerance: Patient tolerated treatment well Patient left: with call bell/phone within reach;in chair;with family/visitor present Nurse Communication: Mobility status PT Visit Diagnosis: Unsteadiness on feet (R26.81);Muscle weakness (generalized) (M62.81);Difficulty in walking, not elsewhere classified (R26.2)     Time: 6433-2951 PT Time Calculation (min) (ACUTE ONLY): 29 min  Charges:  $Gait Training: 8-22 mins $Therapeutic Exercise: 8-22 mins                     Rolm Baptise, PT, DPT   Acute Rehabilitation Department Pager #: [phone]   [name] [name] 07/14/2020, 1:47 PM

## 2020-07-14 NOTE — Evaluation (Signed)
Speech Language Pathology Evaluation Patient Details Name: [name] [name] Youkhana MRN: [id number] DOB: 1961-01-16 Today's Date: 07/14/2020 Time: 6962-9528 SLP Time Calculation (min) (ACUTE ONLY): 20 min  Problem List:  Patient Active Problem List   Diagnosis Date Noted  . Schizoaffective disorder (HCC) 07/14/2020  . Acute ischemic stroke (HCC) 07/13/2020  . Dog bite of face 08/13/2018  . Dog bite of ear 08/13/2018  . Diabetes mellitus without complication (HCC)   . Mood disorder (HCC) 11/11/2017  . Type 2 diabetes mellitus without complication, without long-term current use of insulin (HCC) 11/11/2017  . Class 2 obesity with body mass index (BMI) of 37.0 to 37.9 in adult 11/11/2017  . Mixed hyperlipidemia 11/11/2017  . Tinea corporis 11/08/2017   Past Medical History:  Past Medical History:  Diagnosis Date  . Chickenpox   . Complication of anesthesia    anxiety and panic  . Diabetes mellitus without complication (HCC)    diet controlled, no longer needs meds  . Elevated liver enzymes    ABD US done 08/2016: fatty liver  . Fatty liver    ABD US done 08/2016: fatty liver  . Hyperlipidemia    no longer needs meds  . Hypertension    pt states no longer needs meds  . Hypothyroidism   . OA (osteoarthritis) of hip    right  . Schizophrenia (HCC)   . Thyroid disease    no meds   Past Surgical History:  Past Surgical History:  Procedure Laterality Date  . BREAST BIOPSY     needle bx more than 15 yrs benign, no scar visible   . LACERATION REPAIR N/A 12/13/2017   Procedure: Closure of nasal lacerations with one rotator graft and one advanced flap with skin graft, closure of left nasal laceration, closure of left ear.;  Surgeon: Suzanna Obey, MD;  Location: [facility];  Service: ENT;  Laterality: N/A;   HPI:  Pt is a 59 y.o. female with medical history significant of DM2, HTN, HLD who presented to the ED with c/o R sided weakness and numbness and difficulty with speech expression. Pt had  a headache that started off "like a migraine" but then got worse. MRI brain 8/31: Small acute/early subacute infarct within the left thalamus.   Assessment / Plan / Recommendation Clinical Impression  Pt denied any baseline deficits in speech, language or cognition. Pt reported that she is still getting "stuck on some words" and has difficulty with "interchanging" words. She presented with mild aphasia characterized by impairments in receptive and expressive language. She was able to follow two and three-step commands and answer complex yes/no questions. However, she exhibited difficulty with auditory comprehension at the paragraph level and required additional processing time for reading comprehension at this level. Pt was able to participate in conversation with intermittent word retrieval difficulty and phonemic paraphasias. Pt was educated regarding compensatory strategies for word retrieval and she verbalized understanding. Skilled SLP services are clinically indicated at this time.     SLP Assessment  SLP Recommendation/Assessment: Patient needs continued Speech Lanaguage Pathology Services SLP Visit Diagnosis: Aphasia (R47.01)    Follow Up Recommendations  Inpatient Rehab    Frequency and Duration min 2x/week  2 weeks      SLP Evaluation Cognition  Overall Cognitive Status: Within Functional Limits for tasks assessed Arousal/Alertness: Awake/alert Orientation Level: Oriented X4 Attention: Focused;Sustained Focused Attention: Appears intact Sustained Attention: Appears intact Memory: Impaired Memory Impairment: Storage deficit;Retrieval deficit;Decreased recall of new information (Immediate: 3/3; delayed: 1/3 with cues:  2/2) Awareness: Appears intact Problem Solving: Appears intact Executive Function: Reasoning;Organizing;Sequencing       Comprehension  Auditory Comprehension Overall Auditory Comprehension: Impaired Yes/No Questions: Impaired Basic Biographical Questions:   (5/5) Complex Questions:  (5/5) Paragraph Comprehension (via yes/no questions):  (1/4) Commands: Within Functional Limits Two Step Basic Commands:  (4/4) Multistep Basic Commands:  (3/3) Visual Recognition/Discrimination Discrimination: Within Function Limits    Expression Expression Primary Mode of Expression: Verbal Verbal Expression Overall Verbal Expression: Impaired Initiation: No impairment Automatic Speech: Counting;Day of week;Month of year (wnl) Level of Generative/Spontaneous Verbalization: Conversation Repetition: No impairment (5/5) Naming: Impairment Responsive:  (5/5) Confrontation: Within functional limits (10/10) Convergent:  (5/5) Verbal Errors: Semantic paraphasias Pragmatics: No impairment   Oral / Motor  Oral Motor/Sensory Function Overall Oral Motor/Sensory Function: Within functional limits Motor Speech Overall Motor Speech: Appears within functional limits for tasks assessed Respiration: Within functional limits Phonation: Normal Resonance: Within functional limits Articulation: Within functional limitis Intelligibility: Intelligible Motor Planning: Witnin functional limits Motor Speech Errors: Not applicable   Talen Poser I. Vear Clock, MS, CCC-SLP Acute Rehabilitation Services Office number [phone] Pager [phone]                    Scheryl Marten 07/14/2020, 4:57 PM

## 2020-07-14 NOTE — Evaluation (Signed)
Occupational Therapy Evaluation Patient Details Name: [name] [name] MRN: [id number] DOB: 1961/08/06 Today's Date: 07/14/2020    History of Present Illness Pt is a 59 y/o female admitted secondary to inceased R sided weakness and numbness. Found to have L thalamic infarct. PMH includes DM, HTN, and migraines.    Clinical Impression   PTA patient independent. Admitted for above and limited by problem list below, including R sided weakness and decreased coordination, impaired balance, decreased activity tolerance, and impaired sensation on R side.  Patient currently requires min assist for transfers and in room mobility using hand held assist, very guarded mobility.  Min assist to min guard for basic ADLs. Patient issued theraputty yesterday, and educated her on exercises to complete with it for R hand.  She is highly motivated and eager to return to independent level.  Believe she will best benefit from CIR level rehab at discharge to optimize independence and return to PLOF with ADLs, IADls and mobility. Will follow acutely.     Follow Up Recommendations  CIR    Equipment Recommendations  3 in 1 bedside commode    Recommendations for Other Services Rehab consult     Precautions / Restrictions Precautions Precautions: Fall Restrictions Weight Bearing Restrictions: No      Mobility Bed Mobility Overal bed mobility: Needs Assistance Bed Mobility: Supine to Sit     Supine to sit: Supervision     General bed mobility comments: for safety   Transfers Overall transfer level: Needs assistance Equipment used: 1 person hand held assist Transfers: Sit to/from Stand Sit to Stand: Min assist         General transfer comment: min assist to steady, relies on 1 UE support     Balance Overall balance assessment: Needs assistance Sitting-balance support: No upper extremity supported;Feet supported Sitting balance-Leahy Scale: Fair     Standing balance support: Single  extremity supported;During functional activity Standing balance-Leahy Scale: Poor Standing balance comment: relies on at least 1 UE and external support                            ADL either performed or assessed with clinical judgement   ADL Overall ADL's : Needs assistance/impaired     Grooming: Min guard;Standing;Wash/dry hands   Upper Body Bathing: Supervision/ safety;Sitting   Lower Body Bathing: Minimal assistance;Sit to/from stand   Upper Body Dressing : Minimal assistance;Sitting   Lower Body Dressing: Minimal assistance;Sit to/from stand   Toilet Transfer: Minimal assistance;Ambulation   Toileting- Clothing Manipulation and Hygiene: Minimal assistance;Sit to/from stand       Functional mobility during ADLs: Minimal assistance General ADL Comments: pt limited by R sided weakness and decreased coordination, impaired balance      Vision   Vision Assessment?: No apparent visual deficits     Perception     Praxis      Pertinent Vitals/Pain Pain Assessment: No/denies pain     Hand Dominance Right   Extremity/Trunk Assessment Upper Extremity Assessment Upper Extremity Assessment: RUE deficits/detail RUE Deficits / Details: grossly 4/5 MMT, mild dysmetria  RUE Sensation: decreased light touch RUE Coordination: decreased fine motor;decreased gross motor   Lower Extremity Assessment Lower Extremity Assessment: Defer to PT evaluation       Communication Communication Communication: No difficulties   Cognition Arousal/Alertness: Awake/alert Behavior During Therapy: WFL for tasks assessed/performed Overall Cognitive Status: Within Functional Limits for tasks assessed  General Comments       Exercises     Shoulder Instructions      Home Living Family/patient expects to be discharged to:: Private residence Living Arrangements: Spouse/significant other Available Help at Discharge:  Family;Available 24 hours/day Type of Home: House Home Access: Stairs to enter [employer] of Steps: 5 Entrance Stairs-Rails: Right;Left;Can reach both Home Layout: One level     Bathroom Shower/Tub: Tub only;Walk-in shower   Bathroom Toilet: Standard     Home Equipment: None          Prior Functioning/Environment Level of Independence: Independent        Comments: Very active. Likes to kayak and bike        OT Problem List: Decreased activity tolerance;Impaired balance (sitting and/or standing);Decreased coordination;Decreased knowledge of use of DME or AE;Decreased knowledge of precautions;Impaired UE functional use;Impaired sensation      OT Treatment/Interventions: Self-care/ADL training;Neuromuscular education;DME and/or AE instruction;Therapeutic activities;Patient/family education;Balance training    OT Goals(Current goals can be found in the care plan section) Acute Rehab OT Goals Patient Stated Goal: to be independent again when her grandbaby gets here OT Goal Formulation: With patient Time For Goal Achievement: 07/28/20 Potential to Achieve Goals: Good  OT Frequency: Min 2X/week   Barriers to D/C:            Co-evaluation              AM-PAC OT "6 Clicks" Daily Activity     Outcome Measure Help from another person eating meals?: A Little Help from another person taking care of personal grooming?: A Little Help from another person toileting, which includes using toliet, bedpan, or urinal?: A Little Help from another person bathing (including washing, rinsing, drying)?: A Little Help from another person to put on and taking off regular upper body clothing?: A Little Help from another person to put on and taking off regular lower body clothing?: A Little 6 Click Score: 18   End of Session Equipment Utilized During Treatment: Gait belt Nurse Communication: Mobility status  Activity Tolerance: Patient tolerated treatment well Patient  left: in chair;with call bell/phone within reach  OT Visit Diagnosis: Other abnormalities of gait and mobility (R26.89);Other symptoms and signs involving the nervous system (R29.898)                Time: 4098-1191 OT Time Calculation (min): 20 min Charges:  OT General Charges $OT Visit: 1 Visit OT Evaluation $OT Eval Moderate Complexity: 1 Mod  Barry Brunner, OT Acute Rehabilitation Services Pager [phone] Office [phone]   Chancy Milroy 07/14/2020, 9:35 AM

## 2020-07-14 NOTE — Progress Notes (Signed)
Inpatient Diabetes Program Recommendations  AACE/ADA: New Consensus Statement on Inpatient Glycemic Control (2015)  Target Ranges:  Prepandial:   less than 140 mg/dL      Peak postprandial:   less than 180 mg/dL (1-2 hours)      Critically ill patients:  140 - 180 mg/dL   Lab Results  Component Value Date   GLUCAP 235 (H) 07/14/2020   HGBA1C 14.7 Repeated and verified X2. (H) 11/11/2017    Review of Glycemic Control Results for KYNDAL, HERINGER (MRN [id number]) as of 07/14/2020 15:19  Ref. Range 07/13/2020 20:00 07/13/2020 23:38 07/14/2020 04:06 07/14/2020 08:02 07/14/2020 11:08  Glucose-Capillary Latest Ref Range: 70 - 99 mg/dL 264 (H) 175 (H) 208 (H) 316 (H) 235 (H)   Diabetes history: DM 2 Outpatient Diabetes medications: None- patient has been on Janumet in the past however was taken off due to better control of DM Current orders for Inpatient glycemic control:  Novolog moderate q 4 hours Inpatient Diabetes Program Recommendations:    Spoke with patient regarding diabetes.  She states that she has been monitoring off and on at home and blood sugars have trended up with fasting's in the 180's and PP in the 230's.  We discussed that she likely needs to resume Janumet at d/c.  Awaiting A1C results.   Discussed diet, exercise and the importance of glycemic control to prevent complications.  Patient states that she feels she knows what to do but has not been as focused on caring for herself lately.  Ordered patient LWWD booklet and insulin starter kit (just in case needed).  Will f/u on 9/3 regarding A1C.  Patient verbalized understanding.   Adah Perl, RN, BC-ADM Inpatient Diabetes Coordinator Pager [phone] (8a-5p)

## 2020-07-14 NOTE — Progress Notes (Signed)
STROKE TEAM PROGRESS NOTE   INTERVAL HISTORY Patient is sitting up in bed.  She states she is doing better.  She did participate in the sleep smart study and tested positive for sleep apnea on the overnight Knox 3 monitor.  2D echo shows ejection fraction of 7075% without cardiac source embolism.  Neurological exam is unchanged Vitals:   07/14/20 0000 07/14/20 0400 07/14/20 0809 07/14/20 1159  BP: (!) 147/81 140/80 137/86 (!) 157/89  Pulse: 67 71 68 67  Resp: 16 16 16 16   Temp: 98.1 F (36.7 C) 98 F (36.7 C) 98.3 F (36.8 C) 98 F (36.7 C)  TempSrc: Oral Oral Oral Oral  SpO2: 98% 98% 97% 96%   CBC:  Recent Labs  Lab 07/12/20 2031 07/14/20 1325  WBC 5.7 7.2  NEUTROABS 2.7 3.3  HGB 13.8 15.2*  HCT 42.8 46.5*  MCV 94.3 92.1  PLT 162 709   Basic Metabolic Panel:  Recent Labs  Lab 07/12/20 2031 07/14/20 0429  NA 132* 134*  K 4.2 3.5  CL 101 103  CO2 21* 21*  GLUCOSE 304* 191*  BUN 17 11  CREATININE 0.72 0.58  CALCIUM 9.1 8.9   Lipid Panel:  Recent Labs  Lab 07/13/20 0519  CHOL 274*  TRIG 232*  HDL 37*  CHOLHDL 7.4  VLDL 46*  LDLCALC 191*   HgbA1c: No results for input(s): HGBA1C in the last 168 hours. Urine Drug Screen:  Recent Labs  Lab 07/13/20 1555  LABOPIA NONE DETECTED  COCAINSCRNUR NONE DETECTED  LABBENZ NONE DETECTED  AMPHETMU NONE DETECTED  THCU POSITIVE*  LABBARB NONE DETECTED    Alcohol Level  Recent Labs  Lab 07/12/20 2031  ETH <10    IMAGING past 24 hours No results found.  PHYSICAL EXAM Pleasant mildly obese middle-aged lady not in distress. . Afebrile. Head is nontraumatic. Neck is supple without bruit.    Cardiac exam no murmur or gallop. Lungs are clear to auscultation. Distal pulses are well felt. Neurological Exam : She is awake alert oriented to time place and person.  Mild dysarthria and nonfluent speech with occasional word finding difficulties but good comprehension naming and repetition.  Extraocular movements are full  range without nystagmus.  She blinks to threat bilaterally.  Mild right lower facial weakness.  Tongue midline.  Motor system exam shows no upper or lower extremity drift but mild weakness of right grip and intrinsic hand muscles and orbits left over right upper extremity.  Mild weakness of right hip flexors and ankle dorsiflexors only.  Subjective diminished touch sensation in right lower face and hemibody.  Deep tendon reflexes symmetric.  Plantars downgoing.  Gait not tested. NIH stroke scale 3.  Premorbid baseline modified Rankin scale 0 ASSESSMENT/PLAN [name] [name] is a 59 y.o. female with history of migraine with aura presenting with right-sided weakness and numbness and some difficulty with expressing herself w/ HA  Stroke:   L thalamic lacunar infarct secondary to small vessel disease source  Code Stroke CT head No acute abnormality. ASPECTS 10.     CTA head & neck no ELVO. 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 Aortic atherosclerosis.   CT perfusion negative  MRI  Small subacute L thalamic lacune   2D Echo pending   LDL 191  HgbA1c pending   VTE prophylaxis - Lovenox 40 mg sq daily   No antithrombotic prior to admission, now on aspirin 325 mg daily. Decrease aspirin to 81 and  add plavix 75 mg daily. Continue DAPT x 3 weeks then aspirin alone  Therapy recommendations: CLR disposition:  pending   Hypertension  Stable . Permissive hypertension (OK if < 220/120) but gradually normalize in 5-7 days . Long-term BP goal normotensive  Hyperlipidemia  Home meds:  No statin  Now on lipitor 40 -> increase to 80    LDL 191, goal < 70  Monitor LFTs given hx fatty liver (AST/ALT 30/47)  Continue statin at discharge  Diabetes type II Uncontrolled  HgbA1c pending goal < 7.0  CBGs Recent Labs    07/14/20 0406 07/14/20 0802 07/14/20 1108  GLUCAP 208* 316* 235*      SSI    Other Stroke Risk Factors  Former Cigarette smoker, quit 17  yrs ago  Obesity, There is no height or weight on file to calculate BMI., recommend weight loss, diet and exercise as appropriate   Hx Migraines, last one 20 yrs ago  Hospital day # 1 She presented with right-sided weakness and numbness likely due to left thalamic infarct from small vessel disease.  Recommend continue   aspirin Plavix for 3 weeks followed by aspirin alone and aggressive risk factor modification.  Patient is participating in the sleep smart study and tested positive for sleep apnea on the overnight Knox 3 monitor and will undergo CPAP titration tonight.  Greater than 50% time during this 25-minute visit was spent in counseling and coordination of care about her stroke and discussion about stroke prevention treatment and sleep apnea and answering questions Antony Contras, MD To contact Stroke Continuity provider, please refer to [email or web address] After hours, contact General Neurology

## 2020-07-15 ENCOUNTER — Other Ambulatory Visit: Payer: Self-pay

## 2020-07-15 LAB — CBC WITH DIFFERENTIAL/PLATELET
Abs Immature Granulocytes: 0.05 10*3/uL (ref 0.00–0.07)
Basophils Absolute: 0 10*3/uL (ref 0.0–0.1)
Basophils Relative: 1 %
Eosinophils Absolute: 0.1 10*3/uL (ref 0.0–0.5)
Eosinophils Relative: 1 %
HCT: 47.8 % — ABNORMAL HIGH (ref 36.0–46.0)
Hemoglobin: 15.9 g/dL — ABNORMAL HIGH (ref 12.0–15.0)
Immature Granulocytes: 1 %
Lymphocytes Relative: 38 %
Lymphs Abs: 2.2 10*3/uL (ref 0.7–4.0)
MCH: 31.1 pg (ref 26.0–34.0)
MCHC: 33.3 g/dL (ref 30.0–36.0)
MCV: 93.5 fL (ref 80.0–100.0)
Monocytes Absolute: 0.4 10*3/uL (ref 0.1–1.0)
Monocytes Relative: 7 %
Neutro Abs: 3.1 10*3/uL (ref 1.7–7.7)
Neutrophils Relative %: 52 %
Platelets: 144 10*3/uL — ABNORMAL LOW (ref 150–400)
RBC: 5.11 MIL/uL (ref 3.87–5.11)
RDW: 11.9 % (ref 11.5–15.5)
WBC: 5.9 10*3/uL (ref 4.0–10.5)
nRBC: 0 % (ref 0.0–0.2)

## 2020-07-15 LAB — BASIC METABOLIC PANEL
Anion gap: 12 (ref 5–15)
BUN: 10 mg/dL (ref 6–20)
CO2: 20 mmol/L — ABNORMAL LOW (ref 22–32)
Calcium: 9 mg/dL (ref 8.9–10.3)
Chloride: 98 mmol/L (ref 98–111)
Creatinine, Ser: 0.73 mg/dL (ref 0.44–1.00)
GFR calc Af Amer: >60 mL/min (ref >60)
GFR calc non Af Amer: >60 mL/min (ref >60)
Glucose, Bld: 336 mg/dL — ABNORMAL HIGH (ref 70–99)
Potassium: 4.1 mmol/L (ref 3.5–5.1)
Sodium: 130 mmol/L — ABNORMAL LOW (ref 135–145)

## 2020-07-15 LAB — GLUCOSE, CAPILLARY
Glucose-Capillary: 238 mg/dL — ABNORMAL HIGH (ref 70–99)
Glucose-Capillary: 340 mg/dL — ABNORMAL HIGH (ref 70–99)
Glucose-Capillary: 192 mg/dL — ABNORMAL HIGH (ref 70–99)
Glucose-Capillary: 167 mg/dL — ABNORMAL HIGH (ref 70–99)
Glucose-Capillary: 156 mg/dL — ABNORMAL HIGH (ref 70–99)

## 2020-07-15 MED ORDER — INSULIN GLARGINE 100 UNIT/ML ~~LOC~~ SOLN
10.0000 [IU] | Freq: Every day | SUBCUTANEOUS | Status: DC
  Administered 2020-07-15: 10 [IU] via SUBCUTANEOUS
  Filled 2020-07-15 (×2): qty 0.1

## 2020-07-15 MED ORDER — INSULIN ASPART 100 UNIT/ML ~~LOC~~ SOLN
4.0000 [IU] | Freq: Three times a day (TID) | SUBCUTANEOUS | Status: DC
  Administered 2020-07-15 – 2020-07-19 (×12): 4 [IU] via SUBCUTANEOUS

## 2020-07-15 MED ORDER — INSULIN GLARGINE 100 UNIT/ML ~~LOC~~ SOLN
20.0000 [IU] | Freq: Every day | SUBCUTANEOUS | Status: DC
  Administered 2020-07-16 – 2020-07-19 (×4): 20 [IU] via SUBCUTANEOUS
  Filled 2020-07-15 (×4): qty 0.2

## 2020-07-15 NOTE — Progress Notes (Signed)
CBG- 340 

## 2020-07-15 NOTE — Plan of Care (Signed)
  RD consulted for nutrition education regarding diabetes.   Lab Results  Component Value Date   HGBA1C 11.6 (H) 07/14/2020    RD provided "Heart Healthy Consistent Carbohydrate Nutrition Therapy" handout from the Academy of [employer]etics. Discussed different food groups and their effects on blood sugar, emphasizing carbohydrate-containing foods. Provided list of carbohydrates and recommended serving sizes of common foods. Discussed importance of controlled and consistent carbohydrate intake throughout the day. 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 Provided examples of ways to balance meals/snacks and encouraged intake of high-fiber, whole grain complex carbohydrates. Discussed diabetic friendly drink options.Teach back method used. Pt interested in referral for outpatient diabetes diet education. RD to put in referral.   Expect good compliance.  Current diet order is carbohydrate modified. Pt reports having a good appetite and eating well at meals. Labs and medications reviewed. No further nutrition interventions warranted at this time. RD contact information provided. If additional nutrition issues arise, please re-consult RD.  Corrin Parker, MS, RD, LDN RD pager number/after hours weekend pager number on [employer].

## 2020-07-15 NOTE — Progress Notes (Addendum)
Inpatient Rehab Admissions:  Inpatient Rehab Consult received.  I met with patient at the bedside for rehabilitation assessment and to discuss goals and expectations of an inpatient rehab admission.  She is very independent and active at baseline.  Lives with her spouse who is legally blind; local family (3 members) have all passed away in the last year 2/2 covid related complications.  Her daughter lives out of state.  Anticipate she could easily reach independent level with CIR stay.  I will open insurance for prior authorization and continue to follow.   Signed: Shann Medal, PT, DPT Admissions Coordinator [phone] 07/15/20  12:52 PM

## 2020-07-15 NOTE — Progress Notes (Signed)
Physical Therapy Treatment Patient Details Name: [name] [name] MRN: [id number] DOB: [birth year] Today's Date: 07/15/2020    History of Present Illness Pt is a 59 y/o female admitted secondary to inceased R sided weakness and numbness. Found to have L thalamic infarct. PMH includes DM, HTN, and migraines.     PT Comments    Pt was able to progress gait distance and participate in R LE strengthening exercises in standing.  She still relies heavily on her UE supported on RW for gait, but I would like to attempt practicing with a cane to challenge her more next session as well as start more balance exercises.    Follow Up Recommendations  CIR     Equipment Recommendations  Rolling walker with 5" wheels    Recommendations for Other Services   NA     Precautions / Restrictions Precautions Precautions: Fall Precaution Comments: right leg weakness, numbness, decreased coordination    Mobility  Bed Mobility               General bed mobility comments: Pt was OOB in the recliner chair.   Transfers Overall transfer level: Needs assistance Equipment used: Rolling walker (2 wheeled) Transfers: Sit to/from Stand Sit to Stand: Min guard         General transfer comment: Min guard assist for safety, repeated several times to encourage correct/safest hand placement and control during transitions.  Verbal cues to try to shift weight more to the R as she automatically shifts to the left stronger side.   Ambulation/Gait Ambulation/Gait assistance: Min guard;Min assist Gait Distance (Feet): 150 Feet Assistive device: Rolling walker (2 wheeled) Gait Pattern/deviations: Step-through pattern;Steppage;Decreased weight shift to right;Decreased dorsiflexion - right Gait velocity: decreased Gait velocity interpretation: <1.8 ft/sec, indicate of risk for recurrent falls General Gait Details: Pt initially with steppage pattern, over exaggerating her heel to toe for clearance of R foot,  with increased fatigue came increased R foot drag, knee hyper extension in stance. Once back to room, attempted gait across the room with one person hand held assist and pt was heavy min assist for balance and safety with decreased R LE coordination without the support of the RW.  I would like for her to wean to a cane or none after post acute rehab (CIR level therapies) and I think she could.    Stairs             Wheelchair Mobility    Modified Rankin (Stroke Patients Only) Modified Rankin (Stroke Patients Only) Pre-Morbid Rankin Score: No symptoms Modified Rankin: Moderately severe disability     Balance Overall balance assessment: Needs assistance Sitting-balance support: No upper extremity supported;Feet supported Sitting balance-Leahy Scale: Good     Standing balance support: Bilateral upper extremity supported;Single extremity supported Standing balance-Leahy Scale: Poor Standing balance comment: relies on support in standing of at least one upper extremity                            Cognition Arousal/Alertness: Awake/alert Behavior During Therapy: WFL for tasks assessed/performed Overall Cognitive Status: Within Functional Limits for tasks assessed                                        Exercises General Exercises - Lower Extremity Hip ABduction/ADduction: AROM;Left;20 reps;Standing;Other (comment) (light hands on rail, L knee flexed and not hyper  ext) Toe Raises: AROM;Both;10 reps;Standing (up on both, down on R) Mini-Sqauts: AROM;Both;10 reps (emphasis on weight shifting to the right and slow descent) Other Exercises Other Exercises: hip extension left with emphasis on WB on right leg with knee slightly flexed not in hyper extension.     General Comments        Pertinent Vitals/Pain Pain Assessment: No/denies pain    Home Living                      Prior Function            PT Goals (current goals can now be  found in the care plan section) Acute Rehab PT Goals Patient Stated Goal: to be independent again when her grandbaby gets here Progress towards PT goals: Progressing toward goals    Frequency    Min 4X/week      PT Plan Current plan remains appropriate    Co-evaluation              AM-PAC PT "6 Clicks" Mobility   Outcome Measure  Help needed turning from your back to your side while in a flat bed without using bedrails?: A Little Help needed moving from lying on your back to sitting on the side of a flat bed without using bedrails?: A Little Help needed moving to and from a bed to a chair (including a wheelchair)?: A Little Help needed standing up from a chair using your arms (e.g., wheelchair or bedside chair)?: A Little Help needed to walk in hospital room?: A Little Help needed climbing 3-5 steps with a railing? : A Little 6 Click Score: 18    End of Session   Activity Tolerance: Patient limited by fatigue Patient left: in chair;with call bell/phone within reach   PT Visit Diagnosis: Unsteadiness on feet (R26.81);Muscle weakness (generalized) (M62.81);Difficulty in walking, not elsewhere classified (R26.2)     Time: 5784-6962 PT Time Calculation (min) (ACUTE ONLY): 33 min  Charges:  $Gait Training: 8-22 mins $Therapeutic Exercise: 8-22 mins                     Corinna Capra, PT, DPT  Acute Rehabilitation [phone] pager [phone]) [phone] office

## 2020-07-15 NOTE — PMR Pre-admission (Signed)
PMR Admission Coordinator Pre-Admission Assessment  Patient: [name] [name] is an 59 y.o., female MRN: [id number] DOB: 1961/09/26 Height: 5\' 3"  (160 cm) Weight: 95.6 kg  Insurance Information HMO:     PPO: yes     PCP:      IPA:      80/20:      OTHER:  PRIMARY: [employer]      Policy#: [id number]      Subscriber: pt CM Name: Misty Stanley      Phone#: [phone] ext [phone]     Fax#: [phone] Pre-Cert#: [id number] auth for CIR provided by Misty Stanley with [facility] Medicare for admit 9/7 with updates due to Orson Aloe at fax listed above (phone [phone] ext [phone]) on 9/13.       Employer:  Benefits:  Phone #: [phone]     Name:  Eff. Date: 11/13/19     Deduct: $500 ($0 met)      Out of Pocket Max: [phone] (met $120)      Life Max: n/a CIR: $450/day for days 1-4      SNF: 20 full days Outpatient:      Co-Pay: $20-$40/visit Home Health: 100%      Co-Pay:  DME: 80%     Co-Pay: 20% Providers: preferred network  SECONDARY:       Policy#:      Phone#:   Artist:       Phone#:   The Data processing manager" for patients in Inpatient Rehabilitation Facilities with attached "Privacy Act Statement-Health Care Records" was provided and verbally reviewed with: Patient  Emergency Contact Information Contact Information    Name Relation Home Work Mobile   [name], [name]   [phone]      Current Medical History  Patient Admitting Diagnosis: CVA   History of Present Illness: Pt is a 59 y/o female with PMH of DM, migraines with aura, HTN, and HLD who admitted to [facility] on 8/31 with c/o R sided weakness and numbness as well as difficulty with speech.  CT negative, but MRI showed L thalamic lacunar infarct and small vessel disease.  No cardiac source of embolism identified through workup.  Neurology recommended DAPT x3 weeks followed by aspirin alone.  Pt tested positive for sleep apnea on the overnight Knox 3 monitor and began CPAP titration.  Tolerating  well.  Therapy evaluations were completed and pt was recommended for comprehensive inpatient rehab to facilitate return to independence and discharge home.   Complete NIHSS TOTAL: 1  Patient's medical record from [facility] has been reviewed by the rehabilitation admission coordinator and physician.  Past Medical History  Past Medical History:  Diagnosis Date  . Chickenpox   . Complication of anesthesia    anxiety and panic  . Diabetes mellitus without complication (HCC)    diet controlled, no longer needs meds  . Elevated liver enzymes    ABD US done 08/2016: fatty liver  . Fatty liver    ABD US done 08/2016: fatty liver  . Hyperlipidemia    no longer needs meds  . Hypertension    pt states no longer needs meds  . Hypothyroidism   . OA (osteoarthritis) of hip    right  . Schizophrenia (HCC)   . Thyroid disease    no meds    Family History   family history includes Alcohol abuse in her brother, father, and mother; Arthritis in her brother, father, and mother; Asthma in her father; Diabetes in her mother;  Drug abuse in her brother; Heart disease in her father; Hyperlipidemia in her father and mother; Hypertension in her father and mother.  Prior Rehab/Hospitalizations Has the patient had prior rehab or hospitalizations prior to admission? No  Has the patient had major surgery during 100 days prior to admission? No   Current Medications  Current Facility-Administered Medications:  .   stroke: mapping our early stages of recovery book, , Does not apply, Once, Julian Reil, Jared M, DO .  acetaminophen (TYLENOL) tablet 650 mg, 650 mg, Oral, Q4H PRN, 650 mg at 07/18/20 1049 **OR** acetaminophen (TYLENOL) 160 MG/5ML solution 650 mg, 650 mg, Per Tube, Q4H PRN **OR** acetaminophen (TYLENOL) suppository 650 mg, 650 mg, Rectal, Q4H PRN, Julian Reil, Jared M, DO .  aspirin EC tablet 81 mg, 81 mg, Oral, Daily, Biby, Sharon L, NP, 81 mg at 07/19/20 0810 .  atorvastatin (LIPITOR) tablet  80 mg, 80 mg, Oral, Daily, Biby, Sharon L, NP, 80 mg at 07/19/20 0810 .  clopidogrel (PLAVIX) tablet 75 mg, 75 mg, Oral, Daily, Biby, Sharon L, NP, 75 mg at 07/19/20 0810 .  enoxaparin (LOVENOX) injection 40 mg, 40 mg, Subcutaneous, Daily, Lyda Perone M, DO, 40 mg at 07/19/20 0810 .  insulin aspart (novoLOG) injection 0-15 Units, 0-15 Units, Subcutaneous, TID WC, Briant Cedar, MD, 2 Units at 07/19/20 [id number] .  insulin aspart (novoLOG) injection 0-5 Units, 0-5 Units, Subcutaneous, QHS, Briant Cedar, MD, 3 Units at 07/14/20 2203 .  insulin aspart (novoLOG) injection 4 Units, 4 Units, Subcutaneous, TID WC, Briant Cedar, MD, 4 Units at 07/19/20 0809 .  insulin glargine (LANTUS) injection 20 Units, 20 Units, Subcutaneous, Daily, Briant Cedar, MD, 20 Units at 07/19/20 0809 .  living well with diabetes book MISC, , Does not apply, Once, Briant Cedar, MD .  traZODone (DESYREL) tablet 50 mg, 50 mg, Oral, QHS PRN, Briant Cedar, MD, 50 mg at 07/18/20 2208  Patients Current Diet:  Diet Order            Diet Carb Modified Fluid consistency: Thin; Room service appropriate? Yes  Diet effective now                 Precautions / Restrictions Precautions Precautions: Fall Precaution Comments: right leg weakness, numbness, decreased coordination Restrictions Weight Bearing Restrictions: No   Has the patient had 2 or more falls or a fall with injury in the past year? No  Prior Activity Level Community (5-7x/wk): very active PTA, no DME, liked to be outdoors [employer], biking, gardening, hiking, etc), was driving  Prior Functional Level Self Care: Did the patient need help bathing, dressing, using the toilet or eating? Independent  Indoor Mobility: Did the patient need assistance with walking from room to room (with or without device)? Independent  Stairs: Did the patient need assistance with internal or external stairs (with or without device)?  Independent  Functional Cognition: Did the patient need help planning regular tasks such as shopping or remembering to take medications? Independent  Home Assistive Devices / Equipment Home Assistive Devices/Equipment: None Home Equipment: None  Prior Device Use: Indicate devices/aids used by the patient prior to current illness, exacerbation or injury? None of the above  Current Functional Level Cognition  Arousal/Alertness: Awake/alert Overall Cognitive Status: Within Functional Limits for tasks assessed Orientation Level: Oriented X4 Attention: Focused, Sustained Focused Attention: Appears intact Sustained Attention: Appears intact Memory: Impaired Memory Impairment: Storage deficit, Retrieval deficit, Decreased recall of new information (Immediate: 3/3; delayed: 1/3 with  cues: 2/2) Awareness: Appears intact Problem Solving: Appears intact Executive Function: Reasoning, Organizing, Sequencing    Extremity Assessment (includes Sensation/Coordination)  Upper Extremity Assessment: RUE deficits/detail RUE Deficits / Details: grossly 4/5 MMT, mild dysmetria  RUE Sensation: decreased light touch RUE Coordination: decreased fine motor, decreased gross motor  Lower Extremity Assessment: Defer to PT evaluation RLE Deficits / Details: R hip is "bad" at baseline, but pt reports it feels weaker. R hip flexors at 2/5. Otherwise grossly 3/5. Reports sensation is "dull" on that side RLE Sensation: decreased light touch RLE Coordination: decreased gross motor    ADLs  Overall ADL's : Needs assistance/impaired Eating/Feeding: Minimal assistance, Sitting Eating/Feeding Details (indicate cue type and reason): continues to have difficulty opening containers, cutting food, issued foam build up for trial use and instructed pt to lead with R hand during eating Grooming: Min guard, Standing, Wash/dry hands Upper Body Bathing: Supervision/ safety, Sitting Lower Body Bathing: Minimal assistance,  Sit to/from stand Upper Body Dressing : Minimal assistance, Sitting Lower Body Dressing: Independent, Sitting/lateral leans Lower Body Dressing Details (indicate cue type and reason): socks and shoes, shoes have elastic laces Toilet Transfer: Ambulation, Modified Independent, RW Toilet Transfer Details (indicate cue type and reason): pt has been taking herself to the bathroom routinely during the day Toileting- Clothing Manipulation and Hygiene: Modified independent, Sit to/from stand Functional mobility during ADLs: Supervision/safety, Rolling walker General ADL Comments: pt limited by R sided weakness and decreased coordination, impaired balance     Mobility  Overal bed mobility: Needs Assistance Bed Mobility: Supine to Sit Supine to sit: Supervision General bed mobility comments: Pt was OOB in the recliner chair upon PT arrival    Transfers  Overall transfer level: Needs assistance Equipment used: None Transfers: Sit to/from Stand Sit to Stand: Supervision General transfer comment:  (Practiced sit-stand multiple times for exercise without UE )    Ambulation / Gait / Stairs / Wheelchair Mobility  Ambulation/Gait Ambulation/Gait assistance: Land (Feet): 80 Feet Assistive device: Straight cane Gait Pattern/deviations: Step-through pattern, Decreased stance time - right, Decreased dorsiflexion - right, Decreased weight shift to right General Gait Details: gait is slow and deliberate especially with RLE placement. When adding distractions while pt walking her gait pattern breaks down and increases risk of losing balance.  Gait velocity: decreased Gait velocity interpretation: <1.8 ft/sec, indicate of risk for recurrent falls    Posture / Balance Balance Overall balance assessment: Needs assistance Sitting-balance support: No upper extremity supported, Feet supported Sitting balance-Leahy Scale: Good Standing balance support: Single extremity supported Standing  balance-Leahy Scale: Fair Standing balance comment: relies on support in standing of at least one upper extremity    Special needs/care consideration CPAP and Diabetic management yes   Previous Home Environment (from acute therapy documentation) Living Arrangements: Spouse/significant other  Lives With: Spouse Available Help at Discharge: Family, Available 24 hours/day Type of Home: House Home Layout: One level Home Access: Stairs to enter Entrance Stairs-Rails: Right, Left, Can reach both Entrance Stairs-Number of Steps: 5 Bathroom Shower/Tub: Tub only, Health visitor: Standard Home Care Services: No  Discharge Living Setting Plans for Discharge Living Setting: Patient's home Type of Home at Discharge: House Discharge Home Layout: One level Discharge Home Access: Stairs to enter Entrance Stairs-Rails: Right, Left Entrance Stairs-Number of Steps: 5+1 Discharge Bathroom Shower/Tub: Walk-in shower Discharge Bathroom Toilet: Standard Discharge Bathroom Accessibility: Yes How Accessible: Accessible via wheelchair Does the patient have any problems obtaining your medications?: No  Social/Family/Support Systems Patient  Roles: Spouse Anticipated Caregiver: Bruce Sanderford Anticipated Caregiver's Contact Information: [phone] Ability/Limitations of Caregiver: legally blind Caregiver Availability: 24/7 Discharge Plan Discussed with Primary Caregiver: Yes (pt anticipated to reach indep level) Is Caregiver In Agreement with Plan?: Yes Does Caregiver/Family have Issues with Lodging/Transportation while Pt is in Rehab?: No  Goals Patient/Family Goal for Rehab: PT/OT mod I, n/a SLP Expected length of stay: 7-10 days Pt/Family Agrees to Admission and willing to participate: Yes Program Orientation Provided & Reviewed with Pt/Caregiver Including Roles  & Responsibilities: Yes  Decrease burden of Care through IP rehab admission: n/a  Possible need for SNF placement  upon discharge: No   Patient Condition: I have reviewed medical records from [facility], spoken with CM, and patient. I met with patient at the bedside for inpatient rehabilitation assessment.  Patient will benefit from ongoing PT, OT and SLP, can actively participate in 3 hours of therapy a day 5 days of the week, and can make measurable gains during the admission.  Patient will also benefit from the coordinated team approach during an Inpatient Acute Rehabilitation admission.  The patient will receive intensive therapy as well as Rehabilitation physician, nursing, social worker, and care management interventions.  Due to safety, disease management and patient education the patient requires 24 hour a day rehabilitation nursing.  The patient is currently min assist with mobility and basic ADLs.  Discharge setting and therapy post discharge at home with home health is anticipated.  Patient has agreed to participate in the Acute Inpatient Rehabilitation Program and will admit today.  Preadmission Screen Completed By:  Stephania Fragmin, 07/19/2020 10:39 AM ______________________________________________________________________   Discussed status with Dr. Riley Kill on 07/19/20  at 10:39 AM  and received approval for admission today.  Admission Coordinator: Stephania Fragmin, PT, time 10:39 AM Dorna Bloom 07/19/20    Assessment/Plan: Diagnosis: Left thalamic infarct 1. Does the need for close, 24 hr/day Medical supervision in concert with the patient's rehab needs make it unreasonable for this patient to be served in a less intensive setting? Yes 2. Co-Morbidities requiring supervision/potential complications: DM, HTN, post-stroke sequelae 3. Due to bladder management, bowel management, safety, skin/wound care, disease management, medication administration, pain management and patient education, does the patient require 24 hr/day rehab nursing? Yes 4. Does the patient require coordinated care of a physician,  rehab nurse, PT, OT, and SLP to address physical and functional deficits in the context of the above medical diagnosis(es)? Yes Addressing deficits in the following areas: balance, endurance, locomotion, strength, transferring, bowel/bladder control, bathing, dressing, feeding, grooming, toileting and psychosocial support 5. Can the patient actively participate in an intensive therapy program of at least 3 hrs of therapy 5 days a week? Yes 6. The potential for patient to make measurable gains while on inpatient rehab is excellent 7. Anticipated functional outcomes upon discharge from inpatient rehab: modified independent PT, modified independent OT, n/a SLP 8. Estimated rehab length of stay to reach the above functional goals is: 7-10 days 9. Anticipated discharge destination: Home 10. Overall Rehab/Functional Prognosis: excellent   MD Signature: Ranelle Oyster, MD, [facility] Physical Medicine & Rehabilitation 07/19/2020

## 2020-07-15 NOTE — Progress Notes (Signed)
PROGRESS NOTE  [name] [name] [name] [name]:[id number] DOB: 1961-08-03 DOA: 07/12/2020 PCP: Dois Davenport, MD  HPI/Recap of past 24 hours: HPI from Dr [name] is a 59 y.o. female with medical history significant of DM2, HTN, HLD.  Pt presents to ED with c/o R sided weakness and numbness and difficulty with speech expression.  Had headache that started off "like a migraine" but then got worse.  Hasnt had a migraine since menopause around age 39. ED Course: MRI brain demonstrates acute ischemic stroke.  Neurology consulted.  Patient admitted for further management.      Today, patient still reports some tingling around her right hand, noted improving strength in her right side as well as grip.  Otherwise denies any new complaints.   Assessment/Plan: Principal Problem:   Acute ischemic stroke [facility]) Active Problems:   Type 2 diabetes mellitus without complication, without long-term current use of insulin (HCC)   Mixed hyperlipidemia   Schizoaffective disorder (HCC)   Acute to subacute ischemic L thalamic infarct MRI showed above CT angiogram showed no significant large vessel intracranial or extracranial stenosis Echo EF of 70 to 75%, no regional wall motion abnormality, grade 2 diastolic dysfunction.  No atrial level shunt detected by color-flow Doppler LDL 191, A1c 11.6 Neurology on board, start aspirin 81, Plavix 75 mg daily for 3 weeks and then aspirin alone PT/OT/SLP  Hypertension Permissive hypertension  Hyperlipidemia LDL 191 Start Lipitor 80 mg daily Monitor LFTs given history of fatty liver  Diabetes mellitus type 2 Uncontrolled A1c 11.6 SSI, lantus, novolog TID, Accu-Cheks, hypoglycemic protocol Diabetes coordinator consulted Plan to start on basal insulin, restart home Janumet  Marijuana use UDS showed marijuana  OSA Patient tested positive for sleep apnea on the overnight monitor Continue CPAP  Schizoaffective disorder Stable, managed  without medications       Malnutrition Type:      Malnutrition Characteristics:      Nutrition Interventions:       Estimated body mass index is 40.74 kg/m as calculated from the following:   Height as of this encounter: 5\' 3"  (1.6 m).   Weight as of 09/30/18: 104.3 kg.     Code Status: Full  Family Communication: Discussed with patient  Disposition Plan: Status is: Inpatient  Remains inpatient appropriate because:Inpatient level of care appropriate due to severity of illness   Dispo: The patient is from: Home              Anticipated d/c is to: CIR              Anticipated d/c date is: 1 day              Patient currently is medically stable to d/c.    Consultants:  Neurology  Procedures:  None  Antimicrobials:  None  DVT prophylaxis: Lovenox   Objective: Vitals:   07/15/20 0000 07/15/20 0450 07/15/20 1117 07/15/20 1225  BP: (!) 159/99 132/89  (!) 147/94  Pulse: 79 79  86  Resp: 17 18  16   Temp: 98.3 F (36.8 C) (!) 97.5 F (36.4 C)  97.8 F (36.6 C)  TempSrc: Oral Oral  Oral  SpO2: 97% 97%  96%  Height:   5\' 3"  (1.6 m)    No intake or output data in the 24 hours ending 07/15/20 1540 There were no vitals filed for this visit.  Exam:  General: NAD   Cardiovascular: S1, S2 present  Respiratory: CTAB  Abdomen: Soft, nontender, nondistended,  bowel sounds present  Musculoskeletal: No bilateral pedal edema noted  Skin: Normal  Psychiatry: Normal mood  Neurology: 4+/5 strength on right upper and lower extremity    Data Reviewed: CBC: Recent Labs  Lab 07/12/20 2031 07/14/20 1325 07/15/20 1112  WBC 5.7 7.2 5.9  NEUTROABS 2.7 3.3 3.1  HGB 13.8 15.2* 15.9*  HCT 42.8 46.5* 47.8*  MCV 94.3 92.1 93.5  PLT 162 206 144*   Basic Metabolic Panel: Recent Labs  Lab 07/12/20 2031 07/14/20 0429 07/15/20 1112  NA 132* 134* 130*  K 4.2 3.5 4.1  CL 101 103 98  CO2 21* 21* 20*  GLUCOSE 304* 191* 336*  BUN 17 11 10    CREATININE 0.72 0.58 0.73  CALCIUM 9.1 8.9 9.0   GFR: CrCl cannot be calculated (Unknown ideal weight.). Liver Function Tests: Recent Labs  Lab 07/12/20 2031  AST 30  ALT 47*  ALKPHOS 76  BILITOT 0.5  PROT 6.1*  ALBUMIN 3.3*   No results for input(s): LIPASE, AMYLASE in the last 168 hours. No results for input(s): AMMONIA in the last 168 hours. Coagulation Profile: Recent Labs  Lab 07/12/20 2031  INR 1.0   Cardiac Enzymes: No results for input(s): CKTOTAL, CKMB, CKMBINDEX, TROPONINI in the last 168 hours. BNP (last 3 results) No results for input(s): PROBNP in the last 8760 hours. HbA1C: Recent Labs    07/14/20 1325  HGBA1C 11.6*   CBG: Recent Labs  Lab 07/14/20 1108 07/14/20 1611 07/14/20 2143 07/15/20 0558 07/15/20 1103  GLUCAP 235* 173* 289* 238* 340*   Lipid Profile: Recent Labs    07/13/20 0519  CHOL 274*  HDL 37*  LDLCALC 191*  TRIG 232*  CHOLHDL 7.4   Thyroid Function Tests: No results for input(s): TSH, T4TOTAL, FREET4, T3FREE, THYROIDAB in the last 72 hours. Anemia Panel: No results for input(s): VITAMINB12, FOLATE, FERRITIN, TIBC, IRON, RETICCTPCT in the last 72 hours. Urine analysis:    Component Value Date/Time   COLORURINE YELLOW 07/13/2020 1555   APPEARANCEUR CLEAR 07/13/2020 1555   LABSPEC 1.023 07/13/2020 1555   PHURINE 5.0 07/13/2020 1555   GLUCOSEU >=500 (A) 07/13/2020 1555   HGBUR NEGATIVE 07/13/2020 1555   BILIRUBINUR NEGATIVE 07/13/2020 1555   KETONESUR NEGATIVE 07/13/2020 1555   PROTEINUR NEGATIVE 07/13/2020 1555   NITRITE NEGATIVE 07/13/2020 1555   LEUKOCYTESUR NEGATIVE 07/13/2020 1555   Sepsis Labs: @LABRCNTIP (procalcitonin:4,lacticidven:4)  ) Recent Results (from the past 240 hour(s))  SARS Coronavirus 2 by RT PCR (hospital order, performed in [facility] hospital lab) Nasopharyngeal Nasopharyngeal Swab     Status: None   Collection Time: 07/13/20 12:17 AM   Specimen: Nasopharyngeal Swab  Result Value Ref Range  Status   SARS Coronavirus 2 NEGATIVE NEGATIVE Final    Comment: (NOTE) SARS-CoV-2 target nucleic acids are NOT DETECTED.  The SARS-CoV-2 RNA is generally detectable in upper and lower respiratory specimens during the acute phase of infection. The lowest concentration of SARS-CoV-2 viral copies this assay can detect is 250 copies / mL. A negative result does not preclude SARS-CoV-2 infection and should not be used as the sole basis for treatment or other patient management decisions.  A negative result may occur with improper specimen collection / handling, submission of specimen other than nasopharyngeal swab, presence of viral mutation(s) within the areas targeted by this assay, and inadequate number of viral copies (<250 copies / mL). A negative result must be combined with clinical observations, patient history, and epidemiological information.  Fact Sheet for Patients:  BoilerBrush.com.cy  Fact Sheet for Healthcare Providers: https://pope.com/  This test is not yet approved or  cleared by the [place] FDA and has been authorized for detection and/or diagnosis of SARS-CoV-2 by FDA under an Emergency Use Authorization (EUA).  This EUA will remain in effect (meaning this test can be used) for the duration of the COVID-19 declaration under Section 564(b)(1) of the Act, 21 U.S.C. section 360bbb-3(b)(1), unless the authorization is terminated or revoked sooner.  Performed at [facility], [address] [place], [place] [address]       Studies: No results found.  Scheduled Meds: .  stroke: mapping our early stages of recovery book   Does not apply Once  . aspirin EC  81 mg Oral Daily  . atorvastatin  80 mg Oral Daily  . clopidogrel  75 mg Oral Daily  . enoxaparin (LOVENOX) injection  40 mg Subcutaneous Daily  . insulin aspart  0-15 Units Subcutaneous TID WC  . insulin aspart  0-5 Units Subcutaneous QHS  .  insulin aspart  4 Units Subcutaneous TID WC  . [START ON 07/16/2020] insulin glargine  20 Units Subcutaneous Daily  . insulin starter kit- pen needles  1 kit Other Once    Continuous Infusions:   LOS: 2 days     Briant Cedar, MD [facility]  If 7PM-7AM, please contact night-coverage [email or web address] 07/15/2020, 3:40 PM

## 2020-07-15 NOTE — Progress Notes (Signed)
STROKE TEAM PROGRESS NOTE   INTERVAL HISTORY Patient is sitting up in bed.  She states she is doing better.  She did participate in the sleep smart study and tested positive for sleep apnea on the overnight [place] 3 monitor.  2D echo shows ejection fraction of 7075% without cardiac source embolism.  Neurological exam is unchanged Vitals:   07/15/20 0000 07/15/20 0450 07/15/20 1117 07/15/20 1225  BP: (!) 159/99 132/89  (!) 147/94  Pulse: 79 79  86  Resp: 17 18  16   Temp: 98.3 F (36.8 C) (!) 97.5 F (36.4 C)  97.8 F (36.6 C)  TempSrc: Oral Oral  Oral  SpO2: 97% 97%  96%  Height:   5\' 3"  (1.6 m)    CBC:  Recent Labs  Lab 07/14/20 1325 07/15/20 1112  WBC 7.2 5.9  NEUTROABS 3.3 3.1  HGB 15.2* 15.9*  HCT 46.5* 47.8*  MCV 92.1 93.5  PLT 206 740*   Basic Metabolic Panel:  Recent Labs  Lab 07/14/20 0429 07/15/20 1112  NA 134* 130*  K 3.5 4.1  CL 103 98  CO2 21* 20*  GLUCOSE 191* 336*  BUN 11 10  CREATININE 0.58 0.73  CALCIUM 8.9 9.0   Lipid Panel:  Recent Labs  Lab 07/13/20 0519  CHOL 274*  TRIG 232*  HDL 37*  CHOLHDL 7.4  VLDL 46*  LDLCALC 191*   HgbA1c:  Recent Labs  Lab 07/14/20 1325  HGBA1C 11.6*   Urine Drug Screen:  Recent Labs  Lab 07/13/20 1555  LABOPIA NONE DETECTED  COCAINSCRNUR NONE DETECTED  LABBENZ NONE DETECTED  AMPHETMU NONE DETECTED  THCU POSITIVE*  LABBARB NONE DETECTED    Alcohol Level  Recent Labs  Lab 07/12/20 2031  ETH <10    IMAGING past 24 hours No results found.  PHYSICAL EXAM Pleasant mildly obese middle-aged lady not in distress. . Afebrile. Head is nontraumatic. Neck is supple without bruit.    Cardiac exam no murmur or gallop. Lungs are clear to auscultation. Distal pulses are well felt. Neurological Exam : She is awake alert oriented to time place and person.  Mild dysarthria and nonfluent speech with occasional word finding difficulties but good comprehension naming and repetition.  Extraocular movements are full  range without nystagmus.  She blinks to threat bilaterally.  Mild right lower facial weakness.  Tongue midline.  Motor system exam shows no upper or lower extremity drift but mild weakness of right grip and intrinsic hand muscles and orbits left over right upper extremity.  Mild weakness of right hip flexors and ankle dorsiflexors only.  Subjective diminished touch sensation in right lower face and hemibody.  Deep tendon reflexes symmetric.  Plantars downgoing.  Gait not tested. NIH stroke scale 3.  Premorbid baseline modified Rankin scale 0 ASSESSMENT/PLAN [name] is a 59 y.o. female with history of migraine with aura presenting with right-sided weakness and numbness and some difficulty with expressing herself w/ HA  Stroke:   L thalamic lacunar infarct secondary to small vessel disease source  Code Stroke CT head No acute abnormality. ASPECTS 10.     CTA head & neck no ELVO. 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 Aortic atherosclerosis.   CT perfusion negative  MRI  Small subacute L thalamic lacune   2D Echo left ventricular ejection fraction 70 to 75%.  No cardiac source of embolism.  LDL 191  HgbA1c pending   VTE prophylaxis - Lovenox 40 mg  sq daily   No antithrombotic prior to admission, now on aspirin 325 mg daily. Decrease aspirin to 81 and add plavix 75 mg daily. Continue DAPT x 3 weeks then aspirin alone  Therapy recommendations: CLR disposition:  pending   Hypertension  Stable . Permissive hypertension (OK if < 220/120) but gradually normalize in 5-7 days . Long-term BP goal normotensive  Hyperlipidemia  Home meds:  No statin  Now on lipitor 40 -> increase to 80    LDL 191, goal < 70  Monitor LFTs given hx fatty liver (AST/ALT 30/47)  Continue statin at discharge  Diabetes type II Uncontrolled  HgbA1c pending goal < 7.0  CBGs Recent Labs    07/14/20 2143 07/15/20 0558 07/15/20 1103  GLUCAP 289* 238* 340*      SSI     Other Stroke Risk Factors  Former Cigarette smoker, quit 17 yrs ago  Obesity, Body mass index is 40.74 kg/m., recommend weight loss, diet and exercise as appropriate   Hx Migraines, last one 20 yrs ago  Hospital day # 2 She presented with right-sided weakness and numbness likely due to left thalamic infarct from small vessel disease.  Recommend continue   aspirin  and Plavix for 3 weeks followed by aspirin alone and aggressive risk factor modification.  Patient was counseled to quit smoking cannabis and cigarettes.  Patient is participating in the sleep smart study and has been randomized to the CPAP treatment.  Greater than 50% time during this 25-minute visit was spent in counseling and coordination of care about her stroke and discussion about stroke prevention treatment and sleep apnea and answering questions.  Stroke team will sign off.  Follow-up as an outpatient in the stroke clinic as per sleep smart study protocol.  Discussed with Dr. Marisa Hua, MD To contact Stroke Continuity provider, please refer to [email or web address] After hours, contact General Neurology

## 2020-07-15 NOTE — Progress Notes (Signed)
Inpatient Diabetes Program Recommendations  AACE/ADA: New Consensus Statement on Inpatient Glycemic Control (2015)  Target Ranges:  Prepandial:   less than 140 mg/dL      Peak postprandial:   less than 180 mg/dL (1-2 hours)      Critically ill patients:  140 - 180 mg/dL   Lab Results  Component Value Date   GLUCAP 340 (H) 07/15/2020   HGBA1C 11.6 (H) 07/14/2020    Review of Glycemic Control Results for KAMPBELL, HOLAWAY (MRN [id number]) as of 07/15/2020 14:24  Ref. Range 07/14/2020 11:08 07/14/2020 16:11 07/14/2020 21:43 07/15/2020 05:58 07/15/2020 11:03  Glucose-Capillary Latest Ref Range: 70 - 99 mg/dL 235 (H) 173 (H) 289 (H) 238 (H) 340 (H)  Diabetes history: DM 2 Outpatient Diabetes medications: None- patient has been on Janumet in the past however was taken off due to better control of DM Current orders for Inpatient glycemic control:  Novolog moderate tid with meals, Lantus 10 units daily Inpatient Diabetes Program Recommendations:    Please increase Lantus to 20 units daily.  Also please add Novolog meal coverage 4 units tid with meals (hold if patient eats less than 50%).    Patient may need insulin at d/c based on A1C of 11.6%- Reported A1C to patient.  She is likely going to CIR.  Once in Rehab, may consider restarting Janumet?  However she still may need basal insulin at home as well.  If home insulin is ordered, education will need to be done on CIR.  Will follow.  Discussed with patient.   Thanks  Adah Perl, RN, BC-ADM Inpatient Diabetes Coordinator Pager [phone] (8a-5p)

## 2020-07-15 NOTE — Discharge Instructions (Signed)
Carbohydrate Counting For People With Diabetes  Foods with carbohydrates make your blood glucose level go up. Learning how to count carbohydrates can help you control your blood glucose levels. First, identify the foods you eat that contain carbohydrates. Then, using the Foods with Carbohydrates chart, determine about how much carbohydrates are in your meals and snacks. Make sure you are eating foods with fiber, protein, and healthy fat along with your carbohydrate foods. Foods with Carbohydrates The following table shows carbohydrate foods that have about 15 grams of carbohydrate each. Using measuring cups, spoons, or a food scale when you first begin learning about carbohydrate counting can help you learn about the portion sizes you typically eat. The following foods have 15 grams carbohydrate each:  Grains . 1 slice bread (1 ounce)  . 1 small tortilla (6-inch size)  .  large bagel (1 ounce)  . 1/3 cup pasta or rice (cooked)  .  hamburger or hot dog bun ( ounce)  .  cup cooked cereal  .  to  cup ready-to-eat cereal  . 2 taco shells (5-inch size) Fruit . 1 small fresh fruit ( to 1 cup)  .  medium banana  . 17 small grapes (3 ounces)  . 1 cup melon or berries  .  cup canned or frozen fruit  . 2 tablespoons dried fruit (blueberries, cherries, cranberries, raisins)  .  cup unsweetened fruit juice  Starchy Vegetables .  cup cooked beans, peas, corn, potatoes/sweet potatoes  .  large baked potato (3 ounces)  . 1 cup acorn or butternut squash  Snack Foods . 3 to 6 crackers  . 8 potato chips or 13 tortilla chips ( ounce to 1 ounce)  . 3 cups popped popcorn  Dairy . 3/4 cup (6 ounces) nonfat plain yogurt, or yogurt with sugar-free sweetener  . 1 cup milk  . 1 cup plain rice, soy, coconut or flavored almond milk Sweets and Desserts .  cup ice cream or frozen yogurt  . 1 tablespoon jam, jelly, pancake syrup, table sugar, or honey  . 2 tablespoons light pancake syrup  . 1 inch  square of frosted cake or 2 inch square of unfrosted cake  . 2 small cookies (2/3 ounce each) or  large cookie  Sometimes you'll have to estimate carbohydrate amounts if you don't know the exact recipe. One cup of mixed foods like soups can have 1 to 2 carbohydrate servings, while some casseroles might have 2 or more servings of carbohydrate. Foods that have less than 20 calories in each serving can be counted as "free" foods. Count 1 cup raw vegetables, or  cup cooked non-starchy vegetables as "free" foods. If you eat 3 or more servings at one meal, then count them as 1 carbohydrate serving.  Foods without Carbohydrates  Not all foods contain carbohydrates. Meat, some dairy, fats, non-starchy vegetables, and many beverages don't contain carbohydrate. So when you count carbohydrates, you can generally exclude chicken, pork, beef, fish, seafood, eggs, tofu, cheese, butter, sour cream, avocado, nuts, seeds, olives, mayonnaise, water, black coffee, unsweetened tea, and zero-calorie drinks. Vegetables with no or low carbohydrate include green beans, cauliflower, tomatoes, and onions. How much carbohydrate should I eat at each meal?  Carbohydrate counting can help you plan your meals and manage your weight. Following are some starting points for carbohydrate intake at each meal. Work with your registered dietitian nutritionist to find the best range that works for your blood glucose and weight.   To Lose Weight To  Maintain Weight  Women 2 - 3 carb servings 3 - 4 carb servings  Men 3 - 4 carb servings 4 - 5 carb servings  Checking your blood glucose after meals will help you know if you need to adjust the timing, type, or number of carbohydrate servings in your meal plan. Achieve and keep a healthy body weight by balancing your food intake and physical activity.  Tips How should I plan my meals?  Plan for half the food on your plate to include non-starchy vegetables, like salad greens, broccoli, or  carrots. Try to eat 3 to 5 servings of non-starchy vegetables every day. Have a protein food at each meal. Protein foods include chicken, fish, meat, eggs, or beans (note that beans contain carbohydrate). These two food groups (non-starchy vegetables and proteins) are low in carbohydrate. If you fill up your plate with these foods, you will eat less carbohydrate but still fill up your stomach. Try to limit your carbohydrate portion to  of the plate.  What fats are healthiest to eat?  Diabetes increases risk for heart disease. To help protect your heart, eat more healthy fats, such as olive oil, nuts, and avocado. Eat less saturated fats like butter, cream, and high-fat meats, like bacon and sausage. Avoid trans fats, which are in all foods that list "partially hydrogenated oil" as an ingredient. What should I drink?  Choose drinks that are not sweetened with sugar. The healthiest choices are water, carbonated or seltzer waters, and tea and coffee without added sugars.  Sweet drinks will make your blood glucose go up very quickly. One serving of soda or energy drink is  cup. It is best to drink these beverages only if your blood glucose is low.  Artificially sweetened, or diet drinks, typically do not increase your blood glucose if they have zero calories in them. Read labels of beverages, as some diet drinks do have carbohydrate and will raise your blood glucose. Label Reading Tips Read Nutrition Facts labels to find out how many grams of carbohydrate are in a food you want to eat. Don't forget: sometimes serving sizes on the label aren't the same as how much food you are going to eat, so you may need to calculate how much carbohydrate is in the food you are serving yourself.   Carbohydrate Counting for People with Diabetes Sample 1-Day Menu  Breakfast  cup yogurt, low fat, low sugar (1 carbohydrate serving)   cup cereal, ready-to-eat, unsweetened (1 carbohydrate serving)  1 cup strawberries (1  carbohydrate serving)   cup almonds ( carbohydrate serving)  Lunch 1, 5 ounce can chunk light tuna  2 ounces cheese, low fat cheddar  6 whole wheat crackers (1 carbohydrate serving)  1 small apple (1 carbohydrate servings)   cup carrots ( carbohydrate serving)   cup snap peas  1 cup 1% milk (1 carbohydrate serving)   Evening Meal Stir fry made with: 3 ounces chicken  1 cup brown rice (3 carbohydrate servings)   cup broccoli ( carbohydrate serving)   cup green beans   cup onions  1 tablespoon olive oil  2 tablespoons teriyaki sauce ( carbohydrate serving)  Evening Snack 1 extra small banana (1 carbohydrate serving)  1 tablespoon peanut butter   Carbohydrate Counting for People with Diabetes Vegan Sample 1-Day Menu  Breakfast 1 cup cooked oatmeal (2 carbohydrate servings)   cup blueberries (1 carbohydrate serving)  2 tablespoons flaxseeds  1 cup soymilk fortified with calcium and vitamin D  1  cup coffee  Lunch 2 slices whole wheat bread (2 carbohydrate servings)   cup baked tofu   cup lettuce  2 slices tomato  2 slices avocado   cup baby carrots ( carbohydrate serving)  1 orange (1 carbohydrate serving)  1 cup soymilk fortified with calcium and vitamin D   Evening Meal Burrito made with: 1 6-inch corn tortilla (1 carbohydrate serving)  1 cup refried vegetarian beans (2 carbohydrate servings)   cup chopped tomatoes   cup lettuce   cup salsa  1/3 cup brown rice (1 carbohydrate serving)  1 tablespoon olive oil for rice   cup zucchini   Evening Snack 6 small whole grain crackers (1 carbohydrate serving)  2 apricots ( carbohydrate serving)   cup unsalted peanuts ( carbohydrate serving)    Carbohydrate Counting for People with Diabetes Vegetarian (Lacto-Ovo) Sample 1-Day Menu  Breakfast 1 cup cooked oatmeal (2 carbohydrate servings)   cup blueberries (1 carbohydrate serving)  2 tablespoons flaxseeds  1 egg  1 cup 1% milk (1 carbohydrate serving)  1  cup coffee  Lunch 2 slices whole wheat bread (2 carbohydrate servings)  2 ounces low-fat cheese   cup lettuce  2 slices tomato  2 slices avocado   cup baby carrots ( carbohydrate serving)  1 orange (1 carbohydrate serving)  1 cup unsweetened tea  Evening Meal Burrito made with: 1 6-inch corn tortilla (1 carbohydrate serving)   cup refried vegetarian beans (1 carbohydrate serving)   cup tomatoes   cup lettuce   cup salsa  1/3 cup brown rice (1 carbohydrate serving)  1 tablespoon olive oil for rice   cup zucchini  1 cup 1% milk (1 carbohydrate serving)  Evening Snack 6 small whole grain crackers (1 carbohydrate serving)  2 apricots ( carbohydrate serving)   cup unsalted peanuts ( carbohydrate serving)    Copyright 2020  [employer]. All rights reserved.  Using Nutrition Labels: Carbohydrate  . Serving Size  . Look at the serving size. All the information on the label is based on this portion. Randol Kern Per Container  . The number of servings contained in the package. . Guidelines for Carbohydrate  . Look at the total grams of carbohydrate in the serving size.  . 1 carbohydrate choice = 15 grams of carbohydrate. Range of Carbohydrate Grams Per Choice  Carbohydrate Grams/Choice Carbohydrate Choices  6-10   11-20 1  21-25 1  26-35 2  36-40 2  41-50 3  51-55 3  56-65 4  66-70 4  71-80 5    Copyright 2020  [employer]y [employer]. All rights reserved.  Corrin Parker, MS, RD, LDN Clinical Dietitian Office Phone [phone]

## 2020-07-16 LAB — BASIC METABOLIC PANEL
Anion gap: 11 (ref 5–15)
BUN: 16 mg/dL (ref 6–20)
CO2: 20 mmol/L — ABNORMAL LOW (ref 22–32)
Calcium: 9.5 mg/dL (ref 8.9–10.3)
Chloride: 105 mmol/L (ref 98–111)
Creatinine, Ser: 0.69 mg/dL (ref 0.44–1.00)
GFR calc Af Amer: >60 mL/min (ref >60)
GFR calc non Af Amer: >60 mL/min (ref >60)
Glucose, Bld: 125 mg/dL — ABNORMAL HIGH (ref 70–99)
Potassium: 3.7 mmol/L (ref 3.5–5.1)
Sodium: 136 mmol/L (ref 135–145)

## 2020-07-16 LAB — CBC WITH DIFFERENTIAL/PLATELET
Abs Immature Granulocytes: 0.04 10*3/uL (ref 0.00–0.07)
Basophils Absolute: 0 10*3/uL (ref 0.0–0.1)
Basophils Relative: 1 %
Eosinophils Absolute: 0.1 10*3/uL (ref 0.0–0.5)
Eosinophils Relative: 1 %
HCT: 48.4 % — ABNORMAL HIGH (ref 36.0–46.0)
Hemoglobin: 16 g/dL — ABNORMAL HIGH (ref 12.0–15.0)
Immature Granulocytes: 1 %
Lymphocytes Relative: 47 %
Lymphs Abs: 3.9 10*3/uL (ref 0.7–4.0)
MCH: 30.1 pg (ref 26.0–34.0)
MCHC: 33.1 g/dL (ref 30.0–36.0)
MCV: 91.1 fL (ref 80.0–100.0)
Monocytes Absolute: 0.5 10*3/uL (ref 0.1–1.0)
Monocytes Relative: 7 %
Neutro Abs: 3.4 10*3/uL (ref 1.7–7.7)
Neutrophils Relative %: 43 %
Platelets: 212 10*3/uL (ref 150–400)
RBC: 5.31 MIL/uL — ABNORMAL HIGH (ref 3.87–5.11)
RDW: 11.9 % (ref 11.5–15.5)
WBC: 8 10*3/uL (ref 4.0–10.5)
nRBC: 0 % (ref 0.0–0.2)

## 2020-07-16 LAB — GLUCOSE, CAPILLARY
Glucose-Capillary: 227 mg/dL — ABNORMAL HIGH (ref 70–99)
Glucose-Capillary: 250 mg/dL — ABNORMAL HIGH (ref 70–99)
Glucose-Capillary: 151 mg/dL — ABNORMAL HIGH (ref 70–99)
Glucose-Capillary: 175 mg/dL — ABNORMAL HIGH (ref 70–99)

## 2020-07-16 NOTE — Progress Notes (Signed)
CSW assessed patient for substance use concerns. Patient scored on SBIRT a 5. Patient reports she uses THC edibles at night to help with sleep. Patient reports her therapist, psychiatrist, and primary care doctor are all aware. Patient reports it has not caused her any significant disruptions and is not interested in treatment for SA at this time. CSW will continue to follow for any discharge supports as needed.

## 2020-07-16 NOTE — Progress Notes (Signed)
PROGRESS NOTE  [name] [name] WJX:[id number] DOB: May 14, 1961 DOA: 07/12/2020 PCP: Dois Davenport, MD  HPI/Recap of past 24 hours: HPI from Dr Barnet Pall Kawecki is a 59 y.o. female with medical history significant of DM2, HTN, HLD.  Pt presents to ED with c/o R sided weakness and numbness and difficulty with speech expression.  Had headache that started off "like a migraine" but then got worse.  Hasnt had a migraine since menopause around age 4. ED Course: MRI brain demonstrates acute ischemic stroke.  Neurology consulted.  Patient admitted for further management.      Today, patient still reports some tingling in her right hand, still intermittently drops objects from her right hand.  Reports feeling overwhelmed today, noted to be teary.  Denies any suicidal/homicidal ideation.  Denies any other new complaints.   Assessment/Plan: Principal Problem:   Acute ischemic stroke [facility]) Active Problems:   Type 2 diabetes mellitus without complication, without long-term current use of insulin (HCC)   Mixed hyperlipidemia   Schizoaffective disorder (HCC)   Acute to subacute ischemic L thalamic infarct MRI showed above CT angiogram showed no significant large vessel intracranial or extracranial stenosis Echo EF of 70 to 75%, no regional wall motion abnormality, grade 2 diastolic dysfunction.  No atrial level shunt detected by color-flow Doppler LDL 191, A1c 11.6 Neurology on board, start aspirin 81, Plavix 75 mg daily for 3 weeks and then aspirin alone PT/OT/SLP  Hypertension Permissive hypertension  Hyperlipidemia LDL 191 Start Lipitor 80 mg daily Monitor LFTs given history of fatty liver  Diabetes mellitus type 2 Uncontrolled A1c 11.6 SSI, lantus, novolog TID, Accu-Cheks, hypoglycemic protocol Diabetes coordinator consulted Plan to start on basal insulin, restart home Janumet upon discharge  Marijuana use UDS showed marijuana  OSA Patient tested positive  for sleep apnea on the overnight monitor Continue CPAP  Schizoaffective disorder Stable, managed without medications       Malnutrition Type:      Malnutrition Characteristics:      Nutrition Interventions:       Estimated body mass index is 40.74 kg/m as calculated from the following:   Height as of this encounter: 5\' 3"  (1.6 m).   Weight as of 09/30/18: 104.3 kg.     Code Status: Full  Family Communication: Discussed with patient  Disposition Plan: Status is: Inpatient  Remains inpatient appropriate because:Inpatient level of care appropriate due to severity of illness   Dispo: The patient is from: Home              Anticipated d/c is to: CIR              Anticipated d/c date is: 1 day              Patient currently is medically stable to d/c.    Consultants:  Neurology  Procedures:  None  Antimicrobials:  None  DVT prophylaxis: Lovenox   Objective: Vitals:   07/15/20 2000 07/15/20 2348 07/16/20 0417 07/16/20 1233  BP: (!) 140/99 108/69 129/60 (!) 153/98  Pulse: 92 76 71 76  Resp: 18 16 17 16   Temp: 98.1 F (36.7 C) 97.8 F (36.6 C) 97.6 F (36.4 C) 98.8 F (37.1 C)  TempSrc: Oral Oral Oral Oral  SpO2: 96% 97% 96% 98%  Height:       No intake or output data in the 24 hours ending 07/16/20 1723 There were no vitals filed for this visit.  Exam:  General: NAD  Cardiovascular: S1, S2 present  Respiratory: CTAB  Abdomen: Soft, nontender, nondistended, bowel sounds present  Musculoskeletal: No bilateral pedal edema noted  Skin: Normal  Psychiatry: Normal mood  Neurology: 4+/5 strength on right upper and lower extremity    Data Reviewed: CBC: Recent Labs  Lab 07/12/20 2031 07/14/20 1325 07/15/20 1112 07/16/20 1456  WBC 5.7 7.2 5.9 8.0  NEUTROABS 2.7 3.3 3.1 3.4  HGB 13.8 15.2* 15.9* 16.0*  HCT 42.8 46.5* 47.8* 48.4*  MCV 94.3 92.1 93.5 91.1  PLT 162 206 144* 212   Basic Metabolic Panel: Recent Labs  Lab  07/12/20 2031 07/14/20 0429 07/15/20 1112 07/16/20 1456  NA 132* 134* 130* 136  K 4.2 3.5 4.1 3.7  CL 101 103 98 105  CO2 21* 21* 20* 20*  GLUCOSE 304* 191* 336* 125*  BUN 17 11 10 16   CREATININE 0.72 0.58 0.73 0.69  CALCIUM 9.1 8.9 9.0 9.5   GFR: CrCl cannot be calculated (Unknown ideal weight.). Liver Function Tests: Recent Labs  Lab 07/12/20 2031  AST 30  ALT 47*  ALKPHOS 76  BILITOT 0.5  PROT 6.1*  ALBUMIN 3.3*   No results for input(s): LIPASE, AMYLASE in the last 168 hours. No results for input(s): AMMONIA in the last 168 hours. Coagulation Profile: Recent Labs  Lab 07/12/20 2031  INR 1.0   Cardiac Enzymes: No results for input(s): CKTOTAL, CKMB, CKMBINDEX, TROPONINI in the last 168 hours. BNP (last 3 results) No results for input(s): PROBNP in the last 8760 hours. HbA1C: Recent Labs    07/14/20 1325  HGBA1C 11.6*   CBG: Recent Labs  Lab 07/15/20 1958 07/15/20 2155 07/16/20 0617 07/16/20 1106 07/16/20 1609  GLUCAP 167* 156* 227* 250* 151*   Lipid Profile: No results for input(s): CHOL, HDL, LDLCALC, TRIG, CHOLHDL, LDLDIRECT in the last 72 hours. Thyroid Function Tests: No results for input(s): TSH, T4TOTAL, FREET4, T3FREE, THYROIDAB in the last 72 hours. Anemia Panel: No results for input(s): VITAMINB12, FOLATE, FERRITIN, TIBC, IRON, RETICCTPCT in the last 72 hours. Urine analysis:    Component Value Date/Time   COLORURINE YELLOW 07/13/2020 1555   APPEARANCEUR CLEAR 07/13/2020 1555   LABSPEC 1.023 07/13/2020 1555   PHURINE 5.0 07/13/2020 1555   GLUCOSEU >=500 (A) 07/13/2020 1555   HGBUR NEGATIVE 07/13/2020 1555   BILIRUBINUR NEGATIVE 07/13/2020 1555   KETONESUR NEGATIVE 07/13/2020 1555   PROTEINUR NEGATIVE 07/13/2020 1555   NITRITE NEGATIVE 07/13/2020 1555   LEUKOCYTESUR NEGATIVE 07/13/2020 1555   Sepsis Labs: @LABRCNTIP (procalcitonin:4,lacticidven:4)  ) Recent Results (from the past 240 hour(s))  SARS Coronavirus 2 by RT PCR  (hospital order, performed in [facility] hospital lab) Nasopharyngeal Nasopharyngeal Swab     Status: None   Collection Time: 07/13/20 12:17 AM   Specimen: Nasopharyngeal Swab  Result Value Ref Range Status   SARS Coronavirus 2 NEGATIVE NEGATIVE Final    Comment: (NOTE) SARS-CoV-2 target nucleic acids are NOT DETECTED.  The SARS-CoV-2 RNA is generally detectable in upper and lower respiratory specimens during the acute phase of infection. The lowest concentration of SARS-CoV-2 viral copies this assay can detect is 250 copies / mL. A negative result does not preclude SARS-CoV-2 infection and should not be used as the sole basis for treatment or other patient management decisions.  A negative result may occur with improper specimen collection / handling, submission of specimen other than nasopharyngeal swab, presence of viral mutation(s) within the areas targeted by this assay, and inadequate number of viral copies (<250 copies / mL).  A negative result must be combined with clinical observations, patient history, and epidemiological information.  Fact Sheet for Patients:   BoilerBrush.com.cy  Fact Sheet for Healthcare Providers: https://pope.com/  This test is not yet approved or  cleared by the [place] FDA and has been authorized for detection and/or diagnosis of SARS-CoV-2 by FDA under an Emergency Use Authorization (EUA).  This EUA will remain in effect (meaning this test can be used) for the duration of the COVID-19 declaration under Section 564(b)(1) of the Act, 21 U.S.C. section 360bbb-3(b)(1), unless the authorization is terminated or revoked sooner.  Performed at [facility], [address] [place], [place] [address]       Studies: No results found.  Scheduled Meds: .  stroke: mapping our early stages of recovery book   Does not apply Once  . aspirin EC  81 mg Oral Daily  . atorvastatin  80 mg Oral Daily   . clopidogrel  75 mg Oral Daily  . enoxaparin (LOVENOX) injection  40 mg Subcutaneous Daily  . insulin aspart  0-15 Units Subcutaneous TID WC  . insulin aspart  0-5 Units Subcutaneous QHS  . insulin aspart  4 Units Subcutaneous TID WC  . insulin glargine  20 Units Subcutaneous Daily    Continuous Infusions:   LOS: 3 days     Briant Cedar, MD [facility]  If 7PM-7AM, please contact night-coverage [email or web address] 07/16/2020, 5:23 PM

## 2020-07-16 NOTE — Progress Notes (Signed)
Physical Therapy Treatment Patient Details Name: [name] [name] MRN: [id number] DOB: [birth year] Today's Date: 07/16/2020    History of Present Illness Pt is a 59 y/o female admitted secondary to inceased R sided weakness and numbness. Found to have L thalamic infarct. PMH includes DM, HTN, and migraines.     PT Comments    The pt is continuing to make great progress with therapy at this time. She was able to progress to use of [facility] for hallway ambulation and was able to make significant improvements in gait pattern and fluidity within the session. She was also able to demo improvements in stability and strength in BLE for exercises such as ankle DF and standing hip flexion to further progress strength and stability needed for transfers and gait. The pt continues to be an excellent candidate for CIR-level therapies to work towards her goal of returning to independence.    Follow Up Recommendations  CIR     Equipment Recommendations   (defer to post acute (pt progressing well to cane))    Recommendations for Other Services       Precautions / Restrictions Precautions Precautions: Fall Precaution Comments: right leg weakness, numbness, decreased coordination Restrictions Weight Bearing Restrictions: No    Mobility  Bed Mobility Overal bed mobility: Needs Assistance             General bed mobility comments: Pt was OOB in the recliner chair.   Transfers Overall transfer level: Needs assistance Equipment used: Straight cane Transfers: Sit to/from Stand Sit to Stand: Min assist;Min guard         General transfer comment: minA initially, progressed to minG with use of cane. VC for technique initially, pt able to complete without assist by end of session. x8 reps in session  Ambulation/Gait Ambulation/Gait assistance: Min guard Gait Distance (Feet): 150 Feet Assistive device: Straight cane Gait Pattern/deviations: Step-to pattern;Step-through pattern;Decreased  stance time - right;Decreased dorsiflexion - right;Decreased weight shift to right Gait velocity: decreased Gait velocity interpretation: <1.8 ft/sec, indicate of risk for recurrent falls General Gait Details: pt initially with step-to pattern using SPC in LUE, after ~75 ft able to progress to step-through with RLE. cues for increased hip, knee, and ankle dorsiflexion to generate foot clearance as the pt initially circumducts RLE    Modified Rankin (Stroke Patients Only) Modified Rankin (Stroke Patients Only) Pre-Morbid Rankin Score: No symptoms Modified Rankin: Moderately severe disability     Balance Overall balance assessment: Needs assistance Sitting-balance support: No upper extremity supported;Feet supported Sitting balance-Leahy Scale: Good     Standing balance support: Single extremity supported Standing balance-Leahy Scale: Poor Standing balance comment: relies on support in standing of at least one upper extremity                            Cognition Arousal/Alertness: Awake/alert Behavior During Therapy: WFL for tasks assessed/performed Overall Cognitive Status: Within Functional Limits for tasks assessed                                        Exercises General Exercises - Lower Extremity Ankle Circles/Pumps: AROM;Both;20 reps;Seated (alternating pattern) Hip Flexion/Marching: AROM;Both;20 reps;Standing (with HHA and SPC)    General Comments General comments (skin integrity, edema, etc.): very motivated      Pertinent Vitals/Pain Pain Assessment: No/denies pain  PT Goals (current goals can now be found in the care plan section) Acute Rehab PT Goals Patient Stated Goal: to be independent again when her grandbaby gets here PT Goal Formulation: With patient Time For Goal Achievement: 07/27/20 Potential to Achieve Goals: Good Progress towards PT goals: Progressing toward goals    Frequency    Min 4X/week      PT  Plan Current plan remains appropriate       AM-PAC PT "6 Clicks" Mobility   Outcome Measure  Help needed turning from your back to your side while in a flat bed without using bedrails?: A Little Help needed moving from lying on your back to sitting on the side of a flat bed without using bedrails?: A Little Help needed moving to and from a bed to a chair (including a wheelchair)?: A Little Help needed standing up from a chair using your arms (e.g., wheelchair or bedside chair)?: A Little Help needed to walk in hospital room?: A Little Help needed climbing 3-5 steps with a railing? : A Little 6 Click Score: 18    End of Session Equipment Utilized During Treatment: Gait belt Activity Tolerance: Patient limited by fatigue Patient left: in chair;with call bell/phone within reach;with family/visitor present Nurse Communication: Mobility status PT Visit Diagnosis: Unsteadiness on feet (R26.81);Muscle weakness (generalized) (M62.81);Difficulty in walking, not elsewhere classified (R26.2)     Time: 1710-1747 PT Time Calculation (min) (ACUTE ONLY): 37 min  Charges:  $Gait Training: 8-22 mins $Neuromuscular Re-education: 8-22 mins                     Rolm Baptise, PT, DPT   Acute Rehabilitation Department Pager #: [phone]   Gaetana Michaelis 07/16/2020, 6:24 PM

## 2020-07-17 LAB — GLUCOSE, CAPILLARY
Glucose-Capillary: 192 mg/dL — ABNORMAL HIGH (ref 70–99)
Glucose-Capillary: 149 mg/dL — ABNORMAL HIGH (ref 70–99)
Glucose-Capillary: 134 mg/dL — ABNORMAL HIGH (ref 70–99)
Glucose-Capillary: 146 mg/dL — ABNORMAL HIGH (ref 70–99)

## 2020-07-17 MED ORDER — LIVING WELL WITH DIABETES BOOK
Freq: Once | Status: DC
  Filled 2020-07-17: qty 1

## 2020-07-17 NOTE — Progress Notes (Addendum)
PROGRESS NOTE  [name] [name] [id number] DOB: May 27, 1961 DOA: 07/12/2020 PCP: Dois Davenport, MD  HPI/Recap of past 24 hours: HPI from [name] is a 59 y.o. female with medical history significant of DM2, HTN, HLD.  Pt presents to ED with c/o R sided weakness and numbness and difficulty with speech expression.  Had headache that started off "like a migraine" but then got worse.  Hasnt had a migraine since menopause around age 45. ED Course: MRI brain demonstrates acute ischemic stroke.  Neurology consulted.  Patient admitted for further management.      Today, patient denies any new complaints.    Assessment/Plan: Principal Problem:   Acute ischemic stroke [facility]) Active Problems:   Type 2 diabetes mellitus without complication, without long-term current use of insulin (HCC)   Mixed hyperlipidemia   Schizoaffective disorder (HCC)   Acute to subacute ischemic L thalamic infarct MRI showed above CT angiogram showed no significant large vessel intracranial or extracranial stenosis Echo EF of 70 to 75%, no regional wall motion abnormality, grade 2 diastolic dysfunction.  No atrial level shunt detected by color-flow Doppler LDL 191, A1c 11.6 Neurology on board, start aspirin 81, Plavix 75 mg daily for 3 weeks and then aspirin alone PT/OT/SLP  Hypertension Permissive hypertension  Hyperlipidemia LDL 191 Start Lipitor 80 mg daily Monitor LFTs given history of fatty liver  Diabetes mellitus type 2 Uncontrolled, with hyperglycemia A1c 11.6 SSI, lantus, novolog TID, Accu-Cheks, hypoglycemic protocol Diabetes coordinator consulted Plan to start on basal insulin, restart home Janumet upon discharge  Marijuana use UDS showed marijuana  OSA Patient tested positive for sleep apnea on the overnight monitor Continue CPAP  Erythrocytosis Likely associated with OSA Daily CBC, may require outpatient heme-onc  Morbid obesity Lifestyle  modification   Schizoaffective disorder Stable, managed without medications       Malnutrition Type:      Malnutrition Characteristics:      Nutrition Interventions:       Estimated body mass index is 40.74 kg/m as calculated from the following:   Height as of this encounter: 5\' 3"  (1.6 m).   Weight as of 09/30/18: 104.3 kg.     Code Status: Full  Family Communication: Discussed with patient  Disposition Plan: Status is: Inpatient  Remains inpatient appropriate because:Inpatient level of care appropriate due to severity of illness   Dispo: The patient is from: Home              Anticipated d/c is to: CIR              Anticipated d/c date is: 1 day              Patient currently is medically stable to d/c.    Consultants: Neurology  Procedures: None  Antimicrobials: None  DVT prophylaxis: Lovenox   Objective: Vitals:   07/16/20 2309 07/17/20 0409 07/17/20 0801 07/17/20 1222  BP: (!) 122/57 125/61 133/76 137/61  Pulse: 70 67 67 88  Resp: 19 18 16 16   Temp: 98.4 F (36.9 C) 97.8 F (36.6 C) 98.5 F (36.9 C) 98.3 F (36.8 C)  TempSrc: Oral Oral Oral Oral  SpO2: 98% 96% 96% 98%  Height:       No intake or output data in the 24 hours ending 07/17/20 1459 There were no vitals filed for this visit.  Exam: General: NAD  Cardiovascular: S1, S2 present Respiratory: CTAB Abdomen: Soft, nontender, nondistended, bowel sounds present Musculoskeletal: No bilateral pedal  edema noted Skin: Normal Psychiatry: Normal mood Neurology: 4+/5 strength on right upper and lower extremity    Data Reviewed: CBC: Recent Labs  Lab 07/12/20 2031 07/14/20 1325 07/15/20 1112 07/16/20 1456  WBC 5.7 7.2 5.9 8.0  NEUTROABS 2.7 3.3 3.1 3.4  HGB 13.8 15.2* 15.9* 16.0*  HCT 42.8 46.5* 47.8* 48.4*  MCV 94.3 92.1 93.5 91.1  PLT 162 206 144* 212   Basic Metabolic Panel: Recent Labs  Lab 07/12/20 2031 07/14/20 0429 07/15/20 1112 07/16/20 1456  NA  132* 134* 130* 136  K 4.2 3.5 4.1 3.7  CL 101 103 98 105  CO2 21* 21* 20* 20*  GLUCOSE 304* 191* 336* 125*  BUN 17 11 10 16   CREATININE 0.72 0.58 0.73 0.69  CALCIUM 9.1 8.9 9.0 9.5   GFR: CrCl cannot be calculated (Unknown ideal weight.). Liver Function Tests: Recent Labs  Lab 07/12/20 2031  AST 30  ALT 47*  ALKPHOS 76  BILITOT 0.5  PROT 6.1*  ALBUMIN 3.3*   No results for input(s): LIPASE, AMYLASE in the last 168 hours. No results for input(s): AMMONIA in the last 168 hours. Coagulation Profile: Recent Labs  Lab 07/12/20 2031  INR 1.0   Cardiac Enzymes: No results for input(s): CKTOTAL, CKMB, CKMBINDEX, TROPONINI in the last 168 hours. BNP (last 3 results) No results for input(s): PROBNP in the last 8760 hours. HbA1C: No results for input(s): HGBA1C in the last 72 hours. CBG: Recent Labs  Lab 07/16/20 1106 07/16/20 1609 07/16/20 2204 07/17/20 0630 07/17/20 1116  GLUCAP 250* 151* 175* 192* 149*   Lipid Profile: No results for input(s): CHOL, HDL, LDLCALC, TRIG, CHOLHDL, LDLDIRECT in the last 72 hours. Thyroid Function Tests: No results for input(s): TSH, T4TOTAL, FREET4, T3FREE, THYROIDAB in the last 72 hours. Anemia Panel: No results for input(s): VITAMINB12, FOLATE, FERRITIN, TIBC, IRON, RETICCTPCT in the last 72 hours. Urine analysis:    Component Value Date/Time   COLORURINE YELLOW 07/13/2020 1555   APPEARANCEUR CLEAR 07/13/2020 1555   LABSPEC 1.023 07/13/2020 1555   PHURINE 5.0 07/13/2020 1555   GLUCOSEU >=500 (A) 07/13/2020 1555   HGBUR NEGATIVE 07/13/2020 1555   BILIRUBINUR NEGATIVE 07/13/2020 1555   KETONESUR NEGATIVE 07/13/2020 1555   PROTEINUR NEGATIVE 07/13/2020 1555   NITRITE NEGATIVE 07/13/2020 1555   LEUKOCYTESUR NEGATIVE 07/13/2020 1555   Sepsis Labs: @LABRCNTIP (procalcitonin:4,lacticidven:4)  ) Recent Results (from the past 240 hour(s))  SARS Coronavirus 2 by RT PCR (hospital order, performed in [facility] hospital lab)  Nasopharyngeal Nasopharyngeal Swab     Status: None   Collection Time: 07/13/20 12:17 AM   Specimen: Nasopharyngeal Swab  Result Value Ref Range Status   SARS Coronavirus 2 NEGATIVE NEGATIVE Final    Comment: (NOTE) SARS-CoV-2 target nucleic acids are NOT DETECTED.  The SARS-CoV-2 RNA is generally detectable in upper and lower respiratory specimens during the acute phase of infection. The lowest concentration of SARS-CoV-2 viral copies this assay can detect is 250 copies / mL. A negative result does not preclude SARS-CoV-2 infection and should not be used as the sole basis for treatment or other patient management decisions.  A negative result may occur with improper specimen collection / handling, submission of specimen other than nasopharyngeal swab, presence of viral mutation(s) within the areas targeted by this assay, and inadequate number of viral copies (<250 copies / mL). A negative result must be combined with clinical observations, patient history, and epidemiological information.  Fact Sheet for Patients:   BoilerBrush.com.cy  Fact Sheet  for Healthcare Providers: https://pope.com/  This test is not yet approved or  cleared by the [place] and has been authorized for detection and/or diagnosis of SARS-CoV-2 by FDA under an Emergency Use Authorization (EUA).  This EUA will remain in effect (meaning this test can be used) for the duration of the COVID-19 declaration under Section 564(b)(1) of the Act, 21 U.S.C. section 360bbb-3(b)(1), unless the authorization is terminated or revoked sooner.  Performed at [facility], [address] [place], [place] [address]       Studies: No results found.  Scheduled Meds:   stroke: mapping our early stages of recovery book   Does not apply Once   aspirin EC  81 mg Oral Daily   atorvastatin  80 mg Oral Daily   clopidogrel  75 mg Oral Daily   enoxaparin (LOVENOX)  injection  40 mg Subcutaneous Daily   insulin aspart  0-15 Units Subcutaneous TID WC   insulin aspart  0-5 Units Subcutaneous QHS   insulin aspart  4 Units Subcutaneous TID WC   insulin glargine  20 Units Subcutaneous Daily    Continuous Infusions:    LOS: 4 days     Briant Cedar, MD [facility]  If 7PM-7AM, please contact night-coverage [email or web address] 07/17/2020, 2:59 PM

## 2020-07-17 NOTE — Progress Notes (Addendum)
Spoke with patient on the phone about her diabetes and questions that she has about diet, medications, and plans for her diabetes control. She was diagnosed about 8 years ago and was controlling it well until this May when she had a lot of stress in her life and had to be out of town. Became very unsure of what was going on with her control.   Seems confused about carbohydrate counting that was introduced to her recently. Dr. Horald Pollen is her PCP and she feels very comfortable with her care. She will follow up with her after discharge. Concerned about taking insulin at home and how to manage once she is discharged from the hospital, following rehab.  Will order Living Well with Diabetes booklet for her. She would probably benefit from outpatient diabetes education with the [facility] Diabetes Education clinic. She has to have a physician referral for an appointment. This can be ordered as a consult for Outpatient diabetes education and then the diabetes coordinator can submit the information into the chart. The clinic will call her once they get the referral.  Will continue to monitor blood sugars while in the hospital. Patient very interested in plans for her after discharge.  Harvel Ricks RN BSN CDE Diabetes Coordinator Pager: [phone]  8am-5pm

## 2020-07-18 LAB — GLUCOSE, CAPILLARY
Glucose-Capillary: 153 mg/dL — ABNORMAL HIGH (ref 70–99)
Glucose-Capillary: 194 mg/dL — ABNORMAL HIGH (ref 70–99)
Glucose-Capillary: 143 mg/dL — ABNORMAL HIGH (ref 70–99)
Glucose-Capillary: 158 mg/dL — ABNORMAL HIGH (ref 70–99)

## 2020-07-18 MED ORDER — TRAZODONE HCL 50 MG PO TABS
50.0000 mg | ORAL_TABLET | Freq: Every evening | ORAL | Status: DC | PRN
  Administered 2020-07-18: 50 mg via ORAL
  Filled 2020-07-18: qty 1

## 2020-07-18 NOTE — Progress Notes (Signed)
Occupational Therapy Treatment Patient Details Name: [name] [name] MRN: [id number] DOB: April 29, 1961 Today's Date: 07/18/2020    History of present illness Pt is a 59 y/o female admitted secondary to inceased R sided weakness and numbness. Found to have L thalamic infarct. PMH includes DM, HTN, and migraines.    OT comments  Pt is making excellent gains in R UE function and mobility. Pt has been toileting modified independently with RW. She demonstrated ability to perform LB dressing independently. Progressed her theraputty exercises to medium soft. Demonstrated ability to write her name slowly, but legibly with R hand. Pt has been leading with her L hand during ADL, instructed her to lead with her R unless a safety concern. Pt verbalized understanding.  Pt is eager to return to independence. Per her report, her husband has a significant visual impairment and she does not wish to be a burden to him.   Follow Up Recommendations  CIR    Equipment Recommendations  3 in 1 bedside commode    Recommendations for Other Services      Precautions / Restrictions Precautions Precautions: Fall Restrictions Weight Bearing Restrictions: No       Mobility Bed Mobility               General bed mobility comments: Pt was OOB in the recliner chair.   Transfers Overall transfer level: Modified independent Equipment used: Rolling walker (2 wheeled)             General transfer comment: cues for hand placement    Balance Overall balance assessment: Needs assistance   Sitting balance-Leahy Scale: Good       Standing balance-Leahy Scale: Fair                             ADL either performed or assessed with clinical judgement   ADL Overall ADL's : Needs assistance/impaired Eating/Feeding: Minimal assistance;Sitting Eating/Feeding Details (indicate cue type and reason): continues to have difficulty opening containers, cutting food, issued foam build up for trial  use and instructed pt to lead with R hand during eating                 Lower Body Dressing: Independent;Sitting/lateral leans Lower Body Dressing Details (indicate cue type and reason): socks and shoes, shoes have elastic laces Toilet Transfer: Ambulation;Modified Independent;RW Toilet Transfer Details (indicate cue type and reason): pt has been taking herself to the bathroom routinely during the day Toileting- Clothing Manipulation and Hygiene: Modified independent;Sit to/from stand       Functional mobility during ADLs: Supervision/safety;Rolling walker       Vision       Perception     Praxis      Cognition Arousal/Alertness: Awake/alert Behavior During Therapy: Anxious Overall Cognitive Status: Within Functional Limits for tasks assessed                                          Exercises Other Exercises Other Exercises: theraputty exercises with R hand, progressed to medium soft Other Exercises: worked on Chiropractor, Tax inspector       General Comments      Pertinent Vitals/ Pain       Pain Assessment: No/denies pain  Home Living  Prior Functioning/Environment              Frequency  Min 2X/week        Progress Toward Goals  OT Goals(current goals can now be found in the care plan section)  Progress towards OT goals: Progressing toward goals  Acute Rehab OT Goals Patient Stated Goal: to be independent again when her grandbaby gets here OT Goal Formulation: With patient Time For Goal Achievement: 07/28/20 Potential to Achieve Goals: Good  Plan Discharge plan remains appropriate    Co-evaluation                 AM-PAC OT "6 Clicks" Daily Activity     Outcome Measure   Help from another person eating meals?: A Little Help from another person taking care of personal grooming?: None Help from another person toileting, which  includes using toliet, bedpan, or urinal?: None Help from another person bathing (including washing, rinsing, drying)?: A Little Help from another person to put on and taking off regular upper body clothing?: None Help from another person to put on and taking off regular lower body clothing?: None 6 Click Score: 22    End of Session Equipment Utilized During Treatment: Rolling walker  OT Visit Diagnosis: Other abnormalities of gait and mobility (R26.89);Other symptoms and signs involving the nervous system (R29.898)   Activity Tolerance Patient tolerated treatment well   Patient Left in chair;with call bell/phone within reach   Nurse Communication          Time: 1610-9604 OT Time Calculation (min): 35 min  Charges: OT General Charges $OT Visit: 1 Visit OT Treatments $Self Care/Home Management : 8-22 mins $Neuromuscular Re-education: 8-22 mins  Martie Round, OTR/L Acute Rehabilitation Services Pager: [phone] Office: [phone]   [name] 07/18/2020, 10:26 AM

## 2020-07-18 NOTE — Progress Notes (Signed)
Physical Therapy Treatment Patient Details Name: [name] [name] MRN: [id number] DOB: September 22, 1961 Today's Date: 07/18/2020    History of Present Illness Pt is a 59 y/o female admitted secondary to inceased R sided weakness and numbness. Found to have L thalamic infarct. PMH includes DM, HTN, and migraines.     PT Comments    Pt progressing well and is excited about being able to go to CIR. Pt is very motivated and CIR should help her tremendously in being able to regain independence.  PT will continue to follow and progress mobility while she is in the acute hospital setting.    Follow Up Recommendations  CIR     Equipment Recommendations  Rolling walker with 5" wheels    Recommendations for Other Services       Precautions / Restrictions Precautions Precautions: Fall Restrictions Weight Bearing Restrictions: No    Mobility  Bed Mobility               General bed mobility comments: Pt was OOB in the recliner chair upon PT arrival  Transfers Overall transfer level: Needs assistance Equipment used: None Transfers: Sit to/from Stand Sit to Stand: Supervision         General transfer comment:  (Practiced sit-stand multiple times for exercise without UE )  Ambulation/Gait Ambulation/Gait assistance: Min guard Gait Distance (Feet): 80 Feet Assistive device: Straight cane Gait Pattern/deviations: Step-through pattern;Decreased stance time - right;Decreased dorsiflexion - right;Decreased weight shift to right Focus was on technique and not distance. Pt could have walked farther if asked to do so.  Gait velocity: decreased   General Gait Details: gait is slow and deliberate especially with RLE placement. When adding distractions while pt walking her gait pattern breaks down and increases risk of losing balance.    Stairs             Wheelchair Mobility    Modified Rankin (Stroke Patients Only) Modified Rankin (Stroke Patients Only) Pre-Morbid Rankin  Score: No symptoms Modified Rankin: Moderately severe disability     Balance Overall balance assessment: Needs assistance   Sitting balance-Leahy Scale: Good       Standing balance-Leahy Scale: Fair                              Cognition Arousal/Alertness: Awake/alert Behavior During Therapy: WFL for tasks assessed/performed Overall Cognitive Status: Within Functional Limits for tasks assessed                                        Exercises General Exercises - Lower Extremity Hip ABduction/ADduction: AROM;Left;10 reps;Standing;Other (comment) (using RW for balance) Hip Flexion/Marching: AAROM;Left;10 reps;Standing (using RW for balance, also performed on LLE without assist) Toe Raises: AROM;Right;10 reps;Standing (using RW for balance) Other Exercises Other Exercises: knee flexion in standing to 90 degrees with hip neutral. AAROM x 10 (ability to perform this exercise improved as she did more re) Other Exercises: theraputty exercises with R hand, progressed to medium soft Other Exercises: worked on Chiropractor, Musician Comments General comments (skin integrity, edema, etc.): Pt very motivated and is excited to be able to go to CIR. Remains at [place]usk for falls, especially in a distracting environment      Pertinent Vitals/Pain Pain Assessment: 0-10 Pain Score: 0-No pain Pain Intervention(s): Premedicated before session    Home Living  Prior Function            PT Goals (current goals can now be found in the care plan section) Acute Rehab PT Goals Patient Stated Goal: to be independent again when her grandbaby gets here Progress towards PT goals: Progressing toward goals    Frequency    Min 4X/week      PT Plan Current plan remains appropriate    Co-evaluation              AM-PAC PT "6 Clicks" Mobility   Outcome Measure  Help needed turning from your back to your side while in a  flat bed without using bedrails?: A Little Help needed moving from lying on your back to sitting on the side of a flat bed without using bedrails?: A Little Help needed moving to and from a bed to a chair (including a wheelchair)?: A Little Help needed standing up from a chair using your arms (e.g., wheelchair or bedside chair)?: A Little Help needed to walk in hospital room?: A Little Help needed climbing 3-5 steps with a railing? : A Lot (not attempted) 6 Click Score: 17    End of Session Equipment Utilized During Treatment: Gait belt Activity Tolerance: Patient tolerated treatment well;No increased pain Patient left: in chair;with call bell/phone within reach Nurse Communication: Mobility status PT Visit Diagnosis: Unsteadiness on feet (R26.81);Muscle weakness (generalized) (M62.81);Difficulty in walking, not elsewhere classified (R26.2)     Time: 7253-6644 PT Time Calculation (min) (ACUTE ONLY): 35 min  Charges:  $Gait Training: 8-22 mins $Neuromuscular Re-education: 8-22 mins                     Lavona Mound, PT   Acute Rehabilitation Services  Pager [phone] Office [phone] 07/18/2020   Donnella Sham 07/18/2020, 12:22 PM

## 2020-07-18 NOTE — Progress Notes (Signed)
PROGRESS NOTE  [name] [name] HQI:[id number] DOB: Jan 26, 1961 DOA: 07/12/2020 PCP: Dois Davenport, MD  HPI/Recap of past 24 hours: HPI from Dr Barnet Pall Feeback is a 59 y.o. female with medical history significant of DM2, HTN, HLD.  Pt presents to ED with c/o R sided weakness and numbness and difficulty with speech expression.  Had headache that started off "like a migraine" but then got worse.  Hasnt had a migraine since menopause around age 43. ED Course: MRI brain demonstrates acute ischemic stroke.  Neurology consulted.  Patient admitted for further management.      Today, patient reports feeling overwhelmed with ongoing medical issues, reports poor sleep.  Was noted to be teary, but appreciate help from care from the medical staff.  Ambulated with patient in the hallway as requested by her, made her feel much better.    Assessment/Plan: Principal Problem:   Acute ischemic stroke [facility]) Active Problems:   Type 2 diabetes mellitus without complication, without long-term current use of insulin (HCC)   Mixed hyperlipidemia   Schizoaffective disorder (HCC)   Acute to subacute ischemic L thalamic infarct MRI showed above CT angiogram showed no significant large vessel intracranial or extracranial stenosis Echo EF of 70 to 75%, no regional wall motion abnormality, grade 2 diastolic dysfunction.  No atrial level shunt detected by color-flow Doppler LDL 191, A1c 11.6 Neurology on board, start aspirin 81, Plavix 75 mg daily for 3 weeks and then aspirin alone PT/OT/SLP  Hypertension Permissive hypertension  Hyperlipidemia LDL 191 Start Lipitor 80 mg daily Monitor LFTs given history of fatty liver  Diabetes mellitus type 2 Uncontrolled, with hyperglycemia A1c 11.6 SSI, lantus, novolog TID, Accu-Cheks, hypoglycemic protocol Diabetes coordinator consulted Plan to start on basal insulin, restart home Janumet upon discharge  Marijuana use UDS showed  marijuana  OSA Patient tested positive for sleep apnea on the overnight monitor Continue CPAP  Erythrocytosis Likely associated with OSA Daily CBC, may require outpatient heme-onc  Morbid obesity Lifestyle modification   Schizoaffective disorder Stable, managed without medications       Malnutrition Type:      Malnutrition Characteristics:      Nutrition Interventions:       Estimated body mass index is 37.34 kg/m as calculated from the following:   Height as of this encounter: 5\' 3"  (1.6 m).   Weight as of this encounter: 95.6 kg.     Code Status: Full  Family Communication: Discussed with patient  Disposition Plan: Status is: Inpatient  Remains inpatient appropriate because:Inpatient level of care appropriate due to severity of illness   Dispo: The patient is from: Home              Anticipated d/c is to: CIR              Anticipated d/c date is: 1 day              Patient currently is medically stable to d/c.    Consultants:  Neurology  Procedures:  None  Antimicrobials:  None  DVT prophylaxis: Lovenox   Objective: Vitals:   07/18/20 0048 07/18/20 0352 07/18/20 0802 07/18/20 1205  BP: 133/64 124/73 (!) 150/85 (!) 145/89  Pulse: 67 65 75 74  Resp: 16 16 16 16   Temp: (!) 97.4 F (36.3 C) 97.6 F (36.4 C) 98.7 F (37.1 C) 98.4 F (36.9 C)  TempSrc: Oral Oral Oral Oral  SpO2: 98% 97% 95% 93%  Weight:  Height:       No intake or output data in the 24 hours ending 07/18/20 1650 Filed Weights   07/17/20 1651  Weight: 95.6 kg    Exam:  General: NAD   Cardiovascular: S1, S2 present  Respiratory: CTAB  Abdomen: Soft, nontender, nondistended, bowel sounds present  Musculoskeletal: No bilateral pedal edema noted  Skin: Normal  Psychiatry: Normal mood  Neurology: 4+/5 strength on right upper and lower extremity    Data Reviewed: CBC: Recent Labs  Lab 07/12/20 2031 07/14/20 1325 07/15/20 1112  07/16/20 1456  WBC 5.7 7.2 5.9 8.0  NEUTROABS 2.7 3.3 3.1 3.4  HGB 13.8 15.2* 15.9* 16.0*  HCT 42.8 46.5* 47.8* 48.4*  MCV 94.3 92.1 93.5 91.1  PLT 162 206 144* 212   Basic Metabolic Panel: Recent Labs  Lab 07/12/20 2031 07/14/20 0429 07/15/20 1112 07/16/20 1456  NA 132* 134* 130* 136  K 4.2 3.5 4.1 3.7  CL 101 103 98 105  CO2 21* 21* 20* 20*  GLUCOSE 304* 191* 336* 125*  BUN 17 11 10 16   CREATININE 0.72 0.58 0.73 0.69  CALCIUM 9.1 8.9 9.0 9.5   GFR: Estimated Creatinine Clearance: 84.3 mL/min (by C-G formula based on SCr of 0.69 mg/dL). Liver Function Tests: Recent Labs  Lab 07/12/20 2031  AST 30  ALT 47*  ALKPHOS 76  BILITOT 0.5  PROT 6.1*  ALBUMIN 3.3*   No results for input(s): LIPASE, AMYLASE in the last 168 hours. No results for input(s): AMMONIA in the last 168 hours. Coagulation Profile: Recent Labs  Lab 07/12/20 2031  INR 1.0   Cardiac Enzymes: No results for input(s): CKTOTAL, CKMB, CKMBINDEX, TROPONINI in the last 168 hours. BNP (last 3 results) No results for input(s): PROBNP in the last 8760 hours. HbA1C: No results for input(s): HGBA1C in the last 72 hours. CBG: Recent Labs  Lab 07/17/20 1551 07/17/20 2133 07/18/20 0601 07/18/20 1119 07/18/20 1641  GLUCAP 134* 146* 153* 194* 143*   Lipid Profile: No results for input(s): CHOL, HDL, LDLCALC, TRIG, CHOLHDL, LDLDIRECT in the last 72 hours. Thyroid Function Tests: No results for input(s): TSH, T4TOTAL, FREET4, T3FREE, THYROIDAB in the last 72 hours. Anemia Panel: No results for input(s): VITAMINB12, FOLATE, FERRITIN, TIBC, IRON, RETICCTPCT in the last 72 hours. Urine analysis:    Component Value Date/Time   COLORURINE YELLOW 07/13/2020 1555   APPEARANCEUR CLEAR 07/13/2020 1555   LABSPEC 1.023 07/13/2020 1555   PHURINE 5.0 07/13/2020 1555   GLUCOSEU >=500 (A) 07/13/2020 1555   HGBUR NEGATIVE 07/13/2020 1555   BILIRUBINUR NEGATIVE 07/13/2020 1555   KETONESUR NEGATIVE 07/13/2020  1555   PROTEINUR NEGATIVE 07/13/2020 1555   NITRITE NEGATIVE 07/13/2020 1555   LEUKOCYTESUR NEGATIVE 07/13/2020 1555   Sepsis Labs: @LABRCNTIP (procalcitonin:4,lacticidven:4)  ) Recent Results (from the past 240 hour(s))  SARS Coronavirus 2 by RT PCR (hospital order, performed in [facility] hospital lab) Nasopharyngeal Nasopharyngeal Swab     Status: None   Collection Time: 07/13/20 12:17 AM   Specimen: Nasopharyngeal Swab  Result Value Ref Range Status   SARS Coronavirus 2 NEGATIVE NEGATIVE Final    Comment: (NOTE) SARS-CoV-2 target nucleic acids are NOT DETECTED.  The SARS-CoV-2 RNA is generally detectable in upper and lower respiratory specimens during the acute phase of infection. The lowest concentration of SARS-CoV-2 viral copies this assay can detect is 250 copies / mL. A negative result does not preclude SARS-CoV-2 infection and should not be used as the sole basis for treatment or other  patient management decisions.  A negative result may occur with improper specimen collection / handling, submission of specimen other than nasopharyngeal swab, presence of viral mutation(s) within the areas targeted by this assay, and inadequate number of viral copies (<250 copies / mL). A negative result must be combined with clinical observations, patient history, and epidemiological information.  Fact Sheet for Patients:   BoilerBrush.com.cy  Fact Sheet for Healthcare Providers: https://pope.com/  This test is not yet approved or  cleared by the [place] FDA and has been authorized for detection and/or diagnosis of SARS-CoV-2 by FDA under an Emergency Use Authorization (EUA).  This EUA will remain in effect (meaning this test can be used) for the duration of the COVID-19 declaration under Section 564(b)(1) of the Act, 21 U.S.C. section 360bbb-3(b)(1), unless the authorization is terminated or revoked sooner.  Performed at [facility], [address] [place], [place] [address]       Studies: No results found.  Scheduled Meds: .  stroke: mapping our early stages of recovery book   Does not apply Once  . aspirin EC  81 mg Oral Daily  . atorvastatin  80 mg Oral Daily  . clopidogrel  75 mg Oral Daily  . enoxaparin (LOVENOX) injection  40 mg Subcutaneous Daily  . insulin aspart  0-15 Units Subcutaneous TID WC  . insulin aspart  0-5 Units Subcutaneous QHS  . insulin aspart  4 Units Subcutaneous TID WC  . insulin glargine  20 Units Subcutaneous Daily  . living well with diabetes book   Does not apply Once    Continuous Infusions:   LOS: 5 days     Briant Cedar, MD [facility]  If 7PM-7AM, please contact night-coverage [email or web address] 07/18/2020, 4:50 PM

## 2020-07-18 NOTE — Progress Notes (Signed)
Inpatient Rehab Admissions Coordinator:   I have insurance authorization for CIR, however I have no beds available for this patient to admit to CIR today.  Will continue to follow for timing of potential admission pending bed availability.   Shann Medal, PT, DPT Admissions Coordinator [phone] 07/18/20  10:39 AM

## 2020-07-19 ENCOUNTER — Other Ambulatory Visit: Payer: Self-pay

## 2020-07-19 ENCOUNTER — Encounter (HOSPITAL_COMMUNITY): Payer: Self-pay | Admitting: Physical Medicine & Rehabilitation

## 2020-07-19 ENCOUNTER — Inpatient Hospital Stay (HOSPITAL_COMMUNITY)
Admission: RE | Admit: 2020-07-19 | Discharge: 2020-07-28 | DRG: 057 | Disposition: A | Discharge status: Home / Self Care | Payer: Medicare PPO | Source: Intra-hospital | Attending: Physical Medicine & Rehabilitation | Admitting: Physical Medicine & Rehabilitation

## 2020-07-19 DIAGNOSIS — F4310 Post-traumatic stress disorder, unspecified: Secondary | ICD-10-CM | POA: Diagnosis present

## 2020-07-19 DIAGNOSIS — Z87891 Personal history of nicotine dependence: Secondary | ICD-10-CM

## 2020-07-19 DIAGNOSIS — G4733 Obstructive sleep apnea (adult) (pediatric): Secondary | ICD-10-CM | POA: Diagnosis present

## 2020-07-19 DIAGNOSIS — E1169 Type 2 diabetes mellitus with other specified complication: Secondary | ICD-10-CM | POA: Diagnosis not present

## 2020-07-19 DIAGNOSIS — E1165 Type 2 diabetes mellitus with hyperglycemia: Secondary | ICD-10-CM | POA: Diagnosis present

## 2020-07-19 DIAGNOSIS — F259 Schizoaffective disorder, unspecified: Secondary | ICD-10-CM | POA: Diagnosis present

## 2020-07-19 DIAGNOSIS — Z794 Long term (current) use of insulin: Secondary | ICD-10-CM | POA: Diagnosis not present

## 2020-07-19 DIAGNOSIS — I10 Essential (primary) hypertension: Secondary | ICD-10-CM

## 2020-07-19 DIAGNOSIS — I69351 Hemiplegia and hemiparesis following cerebral infarction affecting right dominant side: Secondary | ICD-10-CM | POA: Diagnosis not present

## 2020-07-19 DIAGNOSIS — D751 Secondary polycythemia: Secondary | ICD-10-CM

## 2020-07-19 DIAGNOSIS — M1612 Unilateral primary osteoarthritis, left hip: Secondary | ICD-10-CM

## 2020-07-19 DIAGNOSIS — E039 Hypothyroidism, unspecified: Secondary | ICD-10-CM | POA: Diagnosis present

## 2020-07-19 DIAGNOSIS — E669 Obesity, unspecified: Secondary | ICD-10-CM | POA: Diagnosis not present

## 2020-07-19 DIAGNOSIS — K760 Fatty (change of) liver, not elsewhere classified: Secondary | ICD-10-CM | POA: Diagnosis present

## 2020-07-19 DIAGNOSIS — R7401 Elevation of levels of liver transaminase levels: Secondary | ICD-10-CM | POA: Diagnosis not present

## 2020-07-19 DIAGNOSIS — R0989 Other specified symptoms and signs involving the circulatory and respiratory systems: Secondary | ICD-10-CM

## 2020-07-19 DIAGNOSIS — G8191 Hemiplegia, unspecified affecting right dominant side: Secondary | ICD-10-CM | POA: Diagnosis present

## 2020-07-19 DIAGNOSIS — R030 Elevated blood-pressure reading, without diagnosis of hypertension: Secondary | ICD-10-CM

## 2020-07-19 DIAGNOSIS — E782 Mixed hyperlipidemia: Secondary | ICD-10-CM | POA: Diagnosis present

## 2020-07-19 DIAGNOSIS — M169 Osteoarthritis of hip, unspecified: Secondary | ICD-10-CM

## 2020-07-19 DIAGNOSIS — R52 Pain, unspecified: Secondary | ICD-10-CM

## 2020-07-19 DIAGNOSIS — M1611 Unilateral primary osteoarthritis, right hip: Secondary | ICD-10-CM | POA: Diagnosis present

## 2020-07-19 DIAGNOSIS — I639 Cerebral infarction, unspecified: Secondary | ICD-10-CM

## 2020-07-19 DIAGNOSIS — I6381 Other cerebral infarction due to occlusion or stenosis of small artery: Secondary | ICD-10-CM | POA: Diagnosis present

## 2020-07-19 LAB — GLUCOSE, CAPILLARY
Glucose-Capillary: 150 mg/dL — ABNORMAL HIGH (ref 70–99)
Glucose-Capillary: 163 mg/dL — ABNORMAL HIGH (ref 70–99)
Glucose-Capillary: 109 mg/dL — ABNORMAL HIGH (ref 70–99)
Glucose-Capillary: 236 mg/dL — ABNORMAL HIGH (ref 70–99)

## 2020-07-19 MED ORDER — TRAZODONE HCL 50 MG PO TABS: 50.0000 mg | ORAL_TABLET | Freq: Every evening | ORAL | Status: DC | PRN

## 2020-07-19 MED ORDER — PROCHLORPERAZINE MALEATE 5 MG PO TABS: 5.0000 mg | ORAL_TABLET | Freq: Four times a day (QID) | ORAL | Status: DC | PRN

## 2020-07-19 MED ORDER — INSULIN GLARGINE 100 UNIT/ML ~~LOC~~ SOLN
20.0000 [IU] | Freq: Every day | SUBCUTANEOUS | Status: DC
  Administered 2020-07-20: 20 [IU] via SUBCUTANEOUS
  Filled 2020-07-19: qty 0.2

## 2020-07-19 MED ORDER — POLYETHYLENE GLYCOL 3350 17 G PO PACK: 17.0000 g | PACK | Freq: Every day | ORAL | Status: DC | PRN

## 2020-07-19 MED ORDER — CLOPIDOGREL BISULFATE 75 MG PO TABS
75.0000 mg | ORAL_TABLET | Freq: Every day | ORAL | Status: DC
  Administered 2020-07-20 – 2020-07-28 (×9): 75 mg via ORAL
  Filled 2020-07-19 (×10): qty 1

## 2020-07-19 MED ORDER — ATORVASTATIN CALCIUM 80 MG PO TABS: 80.0000 mg | ORAL_TABLET | Freq: Every day | ORAL | Status: DC

## 2020-07-19 MED ORDER — ATORVASTATIN CALCIUM 80 MG PO TABS
80.0000 mg | ORAL_TABLET | Freq: Every day | ORAL | Status: DC
  Administered 2020-07-20 – 2020-07-28 (×9): 80 mg via ORAL
  Filled 2020-07-19 (×9): qty 1

## 2020-07-19 MED ORDER — INSULIN GLARGINE 100 UNIT/ML ~~LOC~~ SOLN: 20.0000 [IU] | Freq: Every day | SUBCUTANEOUS | 11 refills | Status: DC

## 2020-07-19 MED ORDER — ENOXAPARIN SODIUM 40 MG/0.4ML ~~LOC~~ SOLN
40.0000 mg | Freq: Every day | SUBCUTANEOUS | Status: DC
  Administered 2020-07-20 – 2020-07-28 (×9): 40 mg via SUBCUTANEOUS
  Filled 2020-07-19 (×9): qty 0.4

## 2020-07-19 MED ORDER — TRAZODONE HCL 50 MG PO TABS
25.0000 mg | ORAL_TABLET | Freq: Every evening | ORAL | Status: DC | PRN
  Administered 2020-07-24 – 2020-07-27 (×4): 50 mg via ORAL
  Filled 2020-07-19 (×4): qty 1

## 2020-07-19 MED ORDER — ALUM & MAG HYDROXIDE-SIMETH 200-200-20 MG/5ML PO SUSP: 30.0000 mL | ORAL | Status: DC | PRN

## 2020-07-19 MED ORDER — INSULIN ASPART 100 UNIT/ML ~~LOC~~ SOLN
0.0000 [IU] | Freq: Every day | SUBCUTANEOUS | Status: DC
  Administered 2020-07-19: 2 [IU] via SUBCUTANEOUS

## 2020-07-19 MED ORDER — TRAZODONE HCL 50 MG PO TABS
50.0000 mg | ORAL_TABLET | Freq: Every evening | ORAL | Status: DC | PRN
  Administered 2020-07-19 – 2020-07-22 (×3): 50 mg via ORAL
  Filled 2020-07-19 (×4): qty 1

## 2020-07-19 MED ORDER — ACETAMINOPHEN 325 MG PO TABS
325.0000 mg | ORAL_TABLET | ORAL | Status: DC | PRN
  Administered 2020-07-21 – 2020-07-27 (×10): 650 mg via ORAL
  Filled 2020-07-19 (×10): qty 2

## 2020-07-19 MED ORDER — GLIPIZIDE 5 MG PO TABS: 2.5000 mg | ORAL_TABLET | Freq: Every day | ORAL | 11 refills | Status: DC

## 2020-07-19 MED ORDER — INSULIN ASPART 100 UNIT/ML ~~LOC~~ SOLN
4.0000 [IU] | Freq: Three times a day (TID) | SUBCUTANEOUS | Status: AC
  Administered 2020-07-19 – 2020-07-21 (×7): 4 [IU] via SUBCUTANEOUS

## 2020-07-19 MED ORDER — INSULIN ASPART 100 UNIT/ML ~~LOC~~ SOLN
0.0000 [IU] | Freq: Three times a day (TID) | SUBCUTANEOUS | Status: DC
  Administered 2020-07-20: 3 [IU] via SUBCUTANEOUS
  Administered 2020-07-20 – 2020-07-24 (×7): 2 [IU] via SUBCUTANEOUS
  Administered 2020-07-25: 3 [IU] via SUBCUTANEOUS

## 2020-07-19 MED ORDER — GUAIFENESIN-DM 100-10 MG/5ML PO SYRP: 5.0000 mL | ORAL_SOLUTION | Freq: Four times a day (QID) | ORAL | Status: DC | PRN

## 2020-07-19 MED ORDER — PROCHLORPERAZINE 25 MG RE SUPP: 12.5000 mg | Freq: Four times a day (QID) | RECTAL | Status: DC | PRN

## 2020-07-19 MED ORDER — BISACODYL 10 MG RE SUPP: 10.0000 mg | Freq: Every day | RECTAL | Status: DC | PRN

## 2020-07-19 MED ORDER — DIPHENHYDRAMINE HCL 12.5 MG/5ML PO ELIX: 12.5000 mg | ORAL_SOLUTION | Freq: Four times a day (QID) | ORAL | Status: DC | PRN

## 2020-07-19 MED ORDER — CLOPIDOGREL BISULFATE 75 MG PO TABS: 75.0000 mg | ORAL_TABLET | Freq: Every day | ORAL | Status: DC

## 2020-07-19 MED ORDER — INSULIN ASPART 100 UNIT/ML ~~LOC~~ SOLN
0.0000 [IU] | Freq: Three times a day (TID) | SUBCUTANEOUS | 11 refills | Status: DC
Start: 2020-07-19 — End: 2020-07-28

## 2020-07-19 MED ORDER — FLEET ENEMA 7-19 GM/118ML RE ENEM: 1.0000 | ENEMA | Freq: Once | RECTAL | Status: DC | PRN

## 2020-07-19 MED ORDER — ASPIRIN 81 MG PO TBEC
81.0000 mg | DELAYED_RELEASE_TABLET | Freq: Every day | ORAL | 11 refills | Status: DC
Start: 2020-07-20 — End: 2020-07-28

## 2020-07-19 MED ORDER — PROCHLORPERAZINE EDISYLATE 10 MG/2ML IJ SOLN: 5.0000 mg | Freq: Four times a day (QID) | INTRAMUSCULAR | Status: DC | PRN

## 2020-07-19 MED ORDER — ASPIRIN EC 81 MG PO TBEC
81.0000 mg | DELAYED_RELEASE_TABLET | Freq: Every day | ORAL | Status: DC
  Administered 2020-07-20 – 2020-07-28 (×9): 81 mg via ORAL
  Filled 2020-07-19 (×9): qty 1

## 2020-07-19 NOTE — Progress Notes (Signed)
Inpatient Rehabilitation Medication Review by a Pharmacist  A complete drug regimen review was completed for this patient to identify any potential clinically significant medication issues.  Clinically significant medication issues were identified:  yes   Type of Medication Issue Identified Description of Issue Urgent (address now) Non-Urgent (address on AM team rounds) Plan Plan Accepted by Provider? (Yes / No / Pending AM Rounds)  Drug Interaction(s) (clinically significant)       Duplicate Therapy       Allergy       No Medication Administration End Date       Incorrect Dose       Additional Drug Therapy Needed  Glipizide ordered for discharge. Non-urgent Resume discharge if CBGs become uncontrolled >180-200 Will send message  Other         Name of provider notified for urgent issues identified: Naaman Plummer  Provider Method of Notification: Chat  For non-urgent medication issues to be resolved on team rounds tomorrow morning a [facility] Secure Chat Handoff was sent to: Dr. Naaman Plummer   Pharmacist comments: CBGs all currently <200 for the last 24 hrs. Inpatient MD ordered home Glipizide to be resumed at discharge.  Time spent performing this drug regimen review (minutes):  75min   [name] [name], PharmD, BCPS Clinical Staff Pharmacist [email or web address] [place] 07/19/2020 5:28 PM

## 2020-07-19 NOTE — Care Management Important Message (Signed)
Important Message  Patient Details  Name: [name] [name] MRN: [id number] Date of Birth: Jan 20, 1961   Medicare Important Message Given:  Yes - Important Message mailed due to current National Emergency  Verbal consent obtained due to current National Emergency  Relationship to patient: Self Contact Name: Rasheema Truluck Call Date: 07/19/20  Time: 1124 Phone: [phone] Outcome: Spoke with contact Important Message mailed to: Patient address on file    Delorse Lek 07/19/2020, 11:24 AM

## 2020-07-19 NOTE — Progress Notes (Signed)
Inpatient Rehab Admissions Coordinator:   I have a bed available for pt to admit to CIR today.  Dr. Horris Latino in agreement.  I will let pt and TOC team know.   Shann Medal, PT, DPT Admissions Coordinator [phone] 07/19/20  10:38 AM

## 2020-07-19 NOTE — Progress Notes (Signed)
Speech Language Pathology Treatment: Cognitive-Linquistic  Patient Details Name: [name] [name] MRN: [id number] DOB: 12/15/1960 Today's Date: 07/19/2020 Time: 7829-5621 SLP Time Calculation (min) (ACUTE ONLY): 15 min  Assessment / Plan / Recommendation Clinical Impression  Pt seen at bedside for skilled ST intervention targeting goals for improved communication. Pt reports improvement in verbal expression during acute stay. She verbalizes awareness of increased difficulty when fatigued, and is able to use strategy of circumlocution when she experiences word retrieval deficits. Pt was able to engage in conversation regarding CIR and what she hopes to achieve - improved function of RUE and improved mobility. Recommend ST evaluation on rehab to evaluate high level/complex/abstract naming to maximize functional communication.   Session shortened due to transfer to rehab unit.   HPI HPI: Pt is a 59 y.o. female with medical history significant of DM2, HTN, HLD who presented to the ED with c/o R sided weakness and numbness and difficulty with speech expression. Pt had a headache that started off "like a migraine" but then got worse. MRI brain 8/31: Small acute/early subacute infarct within the left thalamus.      SLP Plan  Other (Comment) (PT discharging from acute care. ST follow up on CIR)       Recommendations  ST eval on rehab for evaluation of complex/abstract naming                General recommendations: Rehab consult Oral Care Recommendations: Oral care BID Follow up Recommendations: Inpatient Rehab SLP Visit Diagnosis: Aphasia (R47.01) Plan: Other (Comment) (PT discharging from acute care. ST follow up on CIR)       GO               [name] [name] B. [name] [name], [name], CCC-SLP Speech Language Pathologist Office: [phone]  [name] [name] 07/19/2020, 4:16 PM

## 2020-07-19 NOTE — Progress Notes (Signed)
Pt arrived via wheelchair transferred to bed with 2 s/p pt denies any pain VSS in bed 3SR up call light within reach.

## 2020-07-19 NOTE — Progress Notes (Signed)
Physical Therapy Treatment Patient Details Name: [name] [name] MRN: [id number] DOB: 11-27-1960 Today's Date: 07/19/2020    History of Present Illness Pt is a 59 y/o female admitted secondary to inceased R sided weakness and numbness. Found to have L thalamic infarct. PMH includes DM, HTN, and migraines.     PT Comments    Continuing work on functional mobility and activity tolerance;  Session focused on progressive ambulation, and smoothing out gait pattern; Excellent motivation; Continue to recommend comprehensive inpatient rehab (CIR) for post-acute therapy needs, and she is looking forward to working hard  Follow Up Recommendations  CIR     Equipment Recommendations  Rolling walker with 5" wheels (perhaps cane depending on progress)    Recommendations for Other Services       Precautions / Restrictions Precautions Precautions: Fall Precaution Comments: right leg weakness, numbness, decreased coordination    Mobility  Bed Mobility                  Transfers Overall transfer level: Needs assistance Equipment used: Straight cane Transfers: Sit to/from Stand Sit to Stand: Supervision         General transfer comment: cues for hand placement, and for slow controlled rise and descent to sit  Ambulation/Gait Ambulation/Gait assistance: Min guard Gait Distance (Feet): 80 Feet Assistive device: Straight cane Gait Pattern/deviations: Step-through pattern;Decreased stance time - right;Decreased dorsiflexion - right;Decreased weight shift to right     General Gait Details: Slow and deliberate gait, and noted occasional genu recurvatum R LE in stance; Needs to be careful with distraction   Stairs             Wheelchair Mobility    Modified Rankin (Stroke Patients Only) Modified Rankin (Stroke Patients Only) Pre-Morbid Rankin Score: No symptoms Modified Rankin: Moderately severe disability     Balance Overall balance assessment: Needs  assistance Sitting-balance support: No upper extremity supported;Feet supported Sitting balance-Leahy Scale: Good       Standing balance-Leahy Scale: Fair                              Cognition Arousal/Alertness: Awake/alert Behavior During Therapy: WFL for tasks assessed/performed Overall Cognitive Status: Within Functional Limits for tasks assessed                                        Exercises      General Comments General comments (skin integrity, edema, etc.): Pt very motivated and is excited to be able to go to CIR. Remains at rusk for falls, especially in a distracting environment      Pertinent Vitals/Pain Pain Assessment: No/denies pain    Home Living                      Prior Function            PT Goals (current goals can now be found in the care plan section) Acute Rehab PT Goals Patient Stated Goal: to be independent again when her grandbaby gets here PT Goal Formulation: With patient Time For Goal Achievement: 07/27/20 Potential to Achieve Goals: Good Progress towards PT goals: Progressing toward goals    Frequency    Min 4X/week      PT Plan Current plan remains appropriate    Co-evaluation  AM-PAC PT "6 Clicks" Mobility   Outcome Measure  Help needed turning from your back to your side while in a flat bed without using bedrails?: None Help needed moving from lying on your back to sitting on the side of a flat bed without using bedrails?: A Little Help needed moving to and from a bed to a chair (including a wheelchair)?: A Little Help needed standing up from a chair using your arms (e.g., wheelchair or bedside chair)?: A Little Help needed to walk in hospital room?: A Little Help needed climbing 3-5 steps with a railing? : A Lot 6 Click Score: 18    End of Session Equipment Utilized During Treatment: Gait belt Activity Tolerance: Patient tolerated treatment well;No increased  pain Patient left: in chair;with call bell/phone within reach Nurse Communication: Mobility status PT Visit Diagnosis: Unsteadiness on feet (R26.81);Muscle weakness (generalized) (M62.81);Difficulty in walking, not elsewhere classified (R26.2)     Time: 5784-6962 PT Time Calculation (min) (ACUTE ONLY): 25 min  Charges:  $Gait Training: 23-37 mins                     Van Clines, [place]  Acute Rehabilitation Services Pager [phone] Office [phone]    Levi Aland 07/19/2020, 2:13 PM

## 2020-07-19 NOTE — Progress Notes (Signed)
PMR Admission Coordinator Pre-Admission Assessment   Patient: [name] [name] is an 59 y.o., female MRN: [id number] DOB: [birth year] Height: 5\' 3"  (160 cm) Weight: 95.6 kg   Insurance Information HMO:     PPO: yes     PCP:      IPA:      80/20:      OTHER:  PRIMARY: [employer]      Policy#: [id number]      Subscriber: pt CM Name: Misty Stanley      Phone#: [phone] ext [phone]     Fax#: [phone] Pre-Cert#: [id number] auth for CIR provided by Misty Stanley with [facility] Medicare for admit 9/7 with updates due to Orson Aloe at fax listed above (phone [phone] ext [phone]) on 9/13.       Employer:  Benefits:  Phone #: [phone]     Name:  Eff. Date: 11/13/19     Deduct: $500 ($0 met)      Out of Pocket Max: [phone] (met $120)      Life Max: n/a CIR: $450/day for days 1-4      SNF: 20 full days Outpatient:      Co-Pay: $20-$40/visit Home Health: 100%      Co-Pay:  DME: 80%     Co-Pay: 20% Providers: preferred network  SECONDARY:       Policy#:      Phone#:    Artist:       Phone#:    The Data processing manager" for patients in Inpatient Rehabilitation Facilities with attached "Privacy Act Statement-Health Care Records" was provided and verbally reviewed with: Patient   Emergency Contact Information         Contact Information     Name Relation Home Work Mobile    [name], [name] Edin     [phone]         Current Medical History  Patient Admitting Diagnosis: CVA    History of Present Illness: Pt is a 59 y/o female with PMH of DM, migraines with aura, HTN, and HLD who admitted to [facility] on 8/31 with c/o R sided weakness and numbness as well as difficulty with speech.  CT negative, but MRI showed L thalamic lacunar infarct and small vessel disease.  No cardiac source of embolism identified through workup.  Neurology recommended DAPT x3 weeks followed by aspirin alone.  Pt tested positive for sleep apnea on the overnight [place] 3 monitor and began CPAP  titration.  Tolerating well.  Therapy evaluations were completed and pt was recommended for comprehensive inpatient rehab to facilitate return to independence and discharge home.    Complete NIHSS TOTAL: 1   Patient's medical record from [facility] has been reviewed by the rehabilitation admission coordinator and physician.   Past Medical History      Past Medical History:  Diagnosis Date  . Chickenpox    . Complication of anesthesia      anxiety and panic  . Diabetes mellitus without complication (HCC)      diet controlled, no longer needs meds  . Elevated liver enzymes      ABD US done 08/2016: fatty liver  . Fatty liver      ABD US done 08/2016: fatty liver  . Hyperlipidemia      no longer needs meds  . Hypertension      pt states no longer needs meds  . Hypothyroidism    . OA (osteoarthritis) of hip      right  . Schizophrenia (  HCC)    . Thyroid disease      no meds      Family History   family history includes Alcohol abuse in her brother, father, and mother; Arthritis in her brother, father, and mother; Asthma in her father; Diabetes in her mother; Drug abuse in her brother; Heart disease in her father; Hyperlipidemia in her father and mother; Hypertension in her father and mother.   Prior Rehab/Hospitalizations Has the patient had prior rehab or hospitalizations prior to admission? No   Has the patient had major surgery during 100 days prior to admission? No               Current Medications   Current Facility-Administered Medications:  .   stroke: mapping our early stages of recovery book, , Does not apply, Once, Julian Reil, Jared M, DO .  acetaminophen (TYLENOL) tablet 650 mg, 650 mg, Oral, Q4H PRN, 650 mg at 07/18/20 1049 **OR** acetaminophen (TYLENOL) 160 MG/5ML solution 650 mg, 650 mg, Per Tube, Q4H PRN **OR** acetaminophen (TYLENOL) suppository 650 mg, 650 mg, Rectal, Q4H PRN, Julian Reil, Jared M, DO .  aspirin EC tablet 81 mg, 81 mg, Oral, Daily, Biby,  Sharon L, NP, 81 mg at 07/19/20 0810 .  atorvastatin (LIPITOR) tablet 80 mg, 80 mg, Oral, Daily, Biby, Sharon L, NP, 80 mg at 07/19/20 0810 .  clopidogrel (PLAVIX) tablet 75 mg, 75 mg, Oral, Daily, Biby, Sharon L, NP, 75 mg at 07/19/20 0810 .  enoxaparin (LOVENOX) injection 40 mg, 40 mg, Subcutaneous, Daily, Lyda Perone M, DO, 40 mg at 07/19/20 0810 .  insulin aspart (novoLOG) injection 0-15 Units, 0-15 Units, Subcutaneous, TID WC, Briant Cedar, MD, 2 Units at 07/19/20 [phone] .  insulin aspart (novoLOG) injection 0-5 Units, 0-5 Units, Subcutaneous, QHS, Briant Cedar, MD, 3 Units at 07/14/20 2203 .  insulin aspart (novoLOG) injection 4 Units, 4 Units, Subcutaneous, TID WC, Briant Cedar, MD, 4 Units at 07/19/20 0809 .  insulin glargine (LANTUS) injection 20 Units, 20 Units, Subcutaneous, Daily, Briant Cedar, MD, 20 Units at 07/19/20 0809 .  living well with diabetes book MISC, , Does not apply, Once, Briant Cedar, MD .  traZODone (DESYREL) tablet 50 mg, 50 mg, Oral, QHS PRN, Briant Cedar, MD, 50 mg at 07/18/20 2208   Patients Current Diet:     Diet Order                      Diet Carb Modified Fluid consistency: Thin; Room service appropriate? Yes  Diet effective now                      Precautions / Restrictions Precautions Precautions: Fall Precaution Comments: right leg weakness, numbness, decreased coordination Restrictions Weight Bearing Restrictions: No    Has the patient had 2 or more falls or a fall with injury in the past year? No   Prior Activity Level Community (5-7x/wk): very active PTA, no DME, liked to be outdoors [employer], biking, gardening, hiking, etc), was driving   Prior Functional Level Self Care: Did the patient need help bathing, dressing, using the toilet or eating? Independent   Indoor Mobility: Did the patient need assistance with walking from room to room (with or without device)? Independent   Stairs:  Did the patient need assistance with internal or external stairs (with or without device)? Independent   Functional Cognition: Did the patient need help planning regular tasks such as shopping or  remembering to take medications? Independent   Home Assistive Devices / Equipment Home Assistive Devices/Equipment: None Home Equipment: None   Prior Device Use: Indicate devices/aids used by the patient prior to current illness, exacerbation or injury? None of the above   Current Functional Level Cognition   Arousal/Alertness: Awake/alert Overall Cognitive Status: Within Functional Limits for tasks assessed Orientation Level: Oriented X4 Attention: Focused, Sustained Focused Attention: Appears intact Sustained Attention: Appears intact Memory: Impaired Memory Impairment: Storage deficit, Retrieval deficit, Decreased recall of new information (Immediate: 3/3; delayed: 1/3 with cues: 2/2) Awareness: Appears intact Problem Solving: Appears intact Executive Function: Reasoning, Organizing, Sequencing    Extremity Assessment (includes Sensation/Coordination)   Upper Extremity Assessment: RUE deficits/detail RUE Deficits / Details: grossly 4/5 MMT, mild dysmetria  RUE Sensation: decreased light touch RUE Coordination: decreased fine motor, decreased gross motor  Lower Extremity Assessment: Defer to PT evaluation RLE Deficits / Details: R hip is "bad" at baseline, but pt reports it feels weaker. R hip flexors at 2/5. Otherwise grossly 3/5. Reports sensation is "dull" on that side RLE Sensation: decreased light touch RLE Coordination: decreased gross motor     ADLs   Overall ADL's : Needs assistance/impaired Eating/Feeding: Minimal assistance, Sitting Eating/Feeding Details (indicate cue type and reason): continues to have difficulty opening containers, cutting food, issued foam build up for trial use and instructed pt to lead with R hand during eating Grooming: Min guard, Standing, Wash/dry  hands Upper Body Bathing: Supervision/ safety, Sitting Lower Body Bathing: Minimal assistance, Sit to/from stand Upper Body Dressing : Minimal assistance, Sitting Lower Body Dressing: Independent, Sitting/lateral leans Lower Body Dressing Details (indicate cue type and reason): socks and shoes, shoes have elastic laces Toilet Transfer: Ambulation, Modified Independent, RW Toilet Transfer Details (indicate cue type and reason): pt has been taking herself to the bathroom routinely during the day Toileting- Clothing Manipulation and Hygiene: Modified independent, Sit to/from stand Functional mobility during ADLs: Supervision/safety, Rolling walker General ADL Comments: pt limited by R sided weakness and decreased coordination, impaired balance      Mobility   Overal bed mobility: Needs Assistance Bed Mobility: Supine to Sit Supine to sit: Supervision General bed mobility comments: Pt was OOB in the recliner chair upon PT arrival     Transfers   Overall transfer level: Needs assistance Equipment used: None Transfers: Sit to/from Stand Sit to Stand: Supervision General transfer comment:  (Practiced sit-stand multiple times for exercise without UE )     Ambulation / Gait / Stairs / Wheelchair Mobility   Ambulation/Gait Ambulation/Gait assistance: Land (Feet): 80 Feet Assistive device: Straight cane Gait Pattern/deviations: Step-through pattern, Decreased stance time - right, Decreased dorsiflexion - right, Decreased weight shift to right General Gait Details: gait is slow and deliberate especially with RLE placement. When adding distractions while pt walking her gait pattern breaks down and increases risk of losing balance.  Gait velocity: decreased Gait velocity interpretation: <1.8 ft/sec, indicate of risk for recurrent falls     Posture / Balance Balance Overall balance assessment: Needs assistance Sitting-balance support: No upper extremity supported, Feet  supported Sitting balance-Leahy Scale: Good Standing balance support: Single extremity supported Standing balance-Leahy Scale: Fair Standing balance comment: relies on support in standing of at least one upper extremity     Special needs/care consideration CPAP and Diabetic management yes    Previous Home Environment (from acute therapy documentation) Living Arrangements: Spouse/significant other  Lives With: Spouse Available Help at Discharge: Family, Available 24 hours/day Type of  Home: House Home Layout: One level Home Access: Stairs to enter Entrance Stairs-Rails: Right, Left, Can reach both Entrance Stairs-Number of Steps: 5 Bathroom Shower/Tub: Tub only, Health visitor: Standard Home Care Services: No   Discharge Living Setting Plans for Discharge Living Setting: Patient's home Type of Home at Discharge: House Discharge Home Layout: One level Discharge Home Access: Stairs to enter Entrance Stairs-Rails: Right, Left Entrance Stairs-Number of Steps: 5+1 Discharge Bathroom Shower/Tub: Walk-in shower Discharge Bathroom Toilet: Standard Discharge Bathroom Accessibility: Yes How Accessible: Accessible via wheelchair Does the patient have any problems obtaining your medications?: No   Social/Family/Support Systems Patient Roles: Spouse Anticipated Caregiver: Administrator Anticipated Industrial/product designer Information: [phone] Ability/Limitations of Caregiver: legally blind Caregiver Availability: 24/7 Discharge Plan Discussed with Primary Caregiver: Yes (pt anticipated to reach indep level) Is Caregiver In Agreement with Plan?: Yes Does Caregiver/Family have Issues with Lodging/Transportation while Pt is in Rehab?: No   Goals Patient/Family Goal for Rehab: PT/OT mod I, n/a SLP Expected length of stay: 7-10 days Pt/Family Agrees to Admission and willing to participate: Yes Program Orientation Provided & Reviewed with Pt/Caregiver Including Roles  &  Responsibilities: Yes   Decrease burden of Care through IP rehab admission: n/a   Possible need for SNF placement upon discharge: No    Patient Condition: I have reviewed medical records from [facility], spoken with CM, and patient. I met with patient at the bedside for inpatient rehabilitation assessment.  Patient will benefit from ongoing PT, OT and SLP, can actively participate in 3 hours of therapy a day 5 days of the week, and can make measurable gains during the admission.  Patient will also benefit from the coordinated team approach during an Inpatient Acute Rehabilitation admission.  The patient will receive intensive therapy as well as Rehabilitation physician, nursing, social worker, and care management interventions.  Due to safety, disease management and patient education the patient requires 24 hour a day rehabilitation nursing.  The patient is currently min assist with mobility and basic ADLs.  Discharge setting and therapy post discharge at home with home health is anticipated.  Patient has agreed to participate in the Acute Inpatient Rehabilitation Program and will admit today.   Preadmission Screen Completed By:  Stephania Fragmin, 07/19/2020 10:39 AM ______________________________________________________________________   Discussed status with Dr. Riley Kill on 07/19/20  at 10:39 AM  and received approval for admission today.   Admission Coordinator: Stephania Fragmin, PT, time 10:39 AM Dorna Bloom 07/19/20     Assessment/Plan: Diagnosis: Left thalamic infarct 1. Does the need for close, 24 hr/day Medical supervision in concert with the patient's rehab needs make it unreasonable for this patient to be served in a less intensive setting? Yes 2. Co-Morbidities requiring supervision/potential complications: DM, HTN, post-stroke sequelae 3. Due to bladder management, bowel management, safety, skin/wound care, disease management, medication administration, pain management and patient  education, does the patient require 24 hr/day rehab nursing? Yes 4. Does the patient require coordinated care of a physician, rehab nurse, PT, OT, and SLP to address physical and functional deficits in the context of the above medical diagnosis(es)? Yes Addressing deficits in the following areas: balance, endurance, locomotion, strength, transferring, bowel/bladder control, bathing, dressing, feeding, grooming, toileting and psychosocial support 5. Can the patient actively participate in an intensive therapy program of at least 3 hrs of therapy 5 days a week? Yes 6. The potential for patient to make measurable gains while on inpatient rehab is excellent 7. Anticipated functional outcomes upon discharge  from inpatient rehab: modified independent PT, modified independent OT, n/a SLP 8. Estimated rehab length of stay to reach the above functional goals is: 7-10 days 9. Anticipated discharge destination: Home 10. Overall Rehab/Functional Prognosis: excellent     MD Signature: Ranelle Oyster, MD, [facility] Physical Medicine & Rehabilitation

## 2020-07-19 NOTE — H&P (Signed)
Physical Medicine and Rehabilitation Admission H&P    Chief Complaint  Patient presents with  . Stroke with functional deficits.     HPI: [name] [name] is a 59 year old female with history of T2DM-diet controlled,  HTN, fatty liver, Schizoaffective d/o who was admitted on 07/12/20 with HA, right sided weakness and numbness as well as difficulty speaking. UDS positive for THC. 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  MRI brain showed small acute/early subacute left thalamic infarct. She was out of window for tPA.   2D echo showed EF 70-75% with grade II DD and degenerative MV. Dr. Leonie Man felt that stroke due to small vessel disease and recommended  DAPT X 3 weeks followed by ASA alone.  She was placed in sleep smart study which was positive for OSA and CPAP ordered for use. Therapy ongoing and patient continues to be limited by left sided weakness as well as deficits with comprehension and processing. CIR recommended due to functional deficits.     Review of Systems  Constitutional: Negative for chills and fever.  HENT: Positive for congestion. Negative for hearing loss, sinus pain, sore throat and tinnitus.   Eyes: Negative for blurred vision and double vision.  Respiratory: Negative for cough and hemoptysis.   Cardiovascular: Negative for chest pain and palpitations.  Gastrointestinal: Negative for constipation, heartburn and nausea.  Genitourinary: Negative for dysuria and urgency.  Musculoskeletal: Negative for joint pain and myalgias.  Skin: Negative for itching and rash.  Neurological: Positive for speech change and focal weakness. Negative for dizziness and headaches (ONE migraine 20 years ago. ).  Psychiatric/Behavioral: Negative for depression. The patient is not nervous/anxious.      Past Medical History:  Diagnosis Date  . Chickenpox   . Complication of anesthesia     anxiety and panic  . Diabetes mellitus without complication ([place])    diet controlled, no longer needs meds  . Elevated liver enzymes    ABD US done 08/2016: fatty liver  . Fatty liver    ABD US done 08/2016: fatty liver  . Hyperlipidemia    no longer needs meds  . Hypertension    pt states no longer needs meds  . Hypothyroidism   . OA (osteoarthritis) of hip    right  . Schizophrenia ([place])   . Thyroid disease    no meds    Past Surgical History:  Procedure Laterality Date  . BREAST BIOPSY     needle bx more than 15 yrs benign, no scar visible   . LACERATION REPAIR N/A 12/13/2017   Procedure: Closure of nasal lacerations with one rotator graft and one advanced flap with skin graft, closure of left nasal laceration, closure of left ear.;  Surgeon: [name], MD;  Location: [facility];  Service: ENT;  Laterality: N/A;    Family History  Problem Relation Age of Onset  . Alcohol abuse Mother   . Arthritis Mother   . Diabetes Mother   . Hypertension Mother   . Hyperlipidemia Mother   . Alcohol abuse Father   . Arthritis Father   . Asthma Father   . Heart disease Father   . Hypertension Father   . Hyperlipidemia Father   . Alcohol abuse Brother   . Arthritis Brother   . Drug abuse Brother     Social History:  Married. Disabled due to psch issues.  Husband legally blind. She reports that she quit smoking about 17 years ago. She has never used smokeless tobacco. She reports that has a beer 1-2x week. Uses chewable marijuana for sleep.    Allergies  Allergen Reactions  . Citric Acid   . Dairy Aid [Lactase]    Medications Prior to Admission  Medication Sig Dispense Refill  . ibuprofen (ADVIL,MOTRIN) 200 MG tablet Take 800 mg by mouth daily as needed.    . Probiotic Product (PROBIOTIC DAILY PO) Take 1 capsule by mouth daily.      Drug Regimen Review  Drug regimen was reviewed and remains appropriate with no significant issues identified  Home: Home  Living Family/patient expects to be discharged to:: Private residence Living Arrangements: Spouse/significant other Available Help at Discharge: Family, Available 24 hours/day Type of Home: House Home Access: Stairs to enter [employer] of Steps: 5 Entrance Stairs-Rails: Right, Left, Can reach both Home Layout: One level Bathroom Shower/Tub: Tub only, Multimedia programmer: Standard Home Equipment: None  Lives With: Spouse   Functional History: Prior Function Level of Independence: Independent Comments: Very active. Likes to kayak and bike  Functional Status:  Mobility: Bed Mobility Overal bed mobility: Needs Assistance Bed Mobility: Supine to Sit Supine to sit: Supervision General bed mobility comments: Pt was OOB in the recliner chair upon PT arrival Transfers Overall transfer level: Needs assistance Equipment used: None Transfers: Sit to/from Stand Sit to Stand: Supervision General transfer comment:  (Practiced sit-stand multiple times for exercise without UE ) Ambulation/Gait Ambulation/Gait assistance: Min guard Gait Distance (Feet): 80 Feet Assistive device: Straight cane Gait Pattern/deviations: Step-through pattern, Decreased stance time - right, Decreased dorsiflexion - right, Decreased weight shift to right General Gait Details: gait is slow and deliberate especially with RLE placement. When adding distractions while pt walking her gait pattern breaks down and increases risk of losing balance.  Gait velocity: decreased Gait velocity interpretation: <1.8 ft/sec, indicate of risk for recurrent falls    ADL: ADL Overall ADL's : Needs assistance/impaired Eating/Feeding: Minimal assistance, Sitting Eating/Feeding Details (indicate cue type and reason): continues to have difficulty opening containers, cutting food, issued foam build up for trial use and instructed pt to lead with R hand during eating Grooming: Min guard, Standing, Wash/dry  hands Upper Body Bathing: Supervision/ safety, Sitting Lower Body Bathing: Minimal assistance, Sit to/from stand Upper Body Dressing : Minimal assistance, Sitting Lower Body Dressing: Independent, Sitting/lateral leans Lower Body Dressing Details (indicate cue type and reason): socks and shoes, shoes have elastic laces Toilet Transfer: Ambulation, Modified Independent, RW Toilet Transfer Details (indicate cue type and reason): pt has been taking herself to the bathroom routinely during the day Toileting- Clothing Manipulation and Hygiene: Modified independent, Sit to/from stand Functional mobility during ADLs: Supervision/safety, Rolling walker General ADL Comments: pt limited by R sided weakness and decreased coordination, impaired balance   Cognition: Cognition Overall Cognitive Status: Within Functional Limits for tasks assessed Arousal/Alertness: Awake/alert Orientation Level: Oriented X4 Attention: Focused, Sustained Focused Attention: Appears intact Sustained Attention: Appears intact Memory: Impaired Memory Impairment: Storage deficit, Retrieval deficit, Decreased recall of new information (Immediate: 3/3; delayed: 1/3 with cues: 2/2) Awareness: Appears intact Problem Solving: Appears intact Executive Function: Reasoning, Organizing, Sequencing Cognition Arousal/Alertness: Awake/alert Behavior During Therapy: WFL for tasks assessed/performed Overall Cognitive Status: Within Functional Limits for tasks assessed   Blood pressure (!) 151/78, pulse 80, temperature 98.1 F (36.7 C), temperature source Oral, resp. rate 17, height 5\' 3"  (1.6 m), weight 95.6 kg, SpO2 97 %.  Physical Exam Vitals and nursing note reviewed.  Constitutional:      Appearance: Normal appearance. She is obese.  HENT:     Head: Normocephalic and atraumatic.     Right Ear: External ear normal.     Left Ear: External ear normal.     Mouth/Throat:     Mouth: Mucous membranes are moist.     Pharynx:  Oropharynx is clear.  Eyes:     Extraocular Movements: Extraocular movements intact.     Pupils: Pupils are equal, round, and reactive to light.  Cardiovascular:     Rate and Rhythm: Normal rate and regular rhythm.     Heart sounds: No murmur heard.  No friction rub.  Pulmonary:     Effort: Pulmonary effort is normal. No respiratory distress.     Breath sounds: Normal breath sounds.  Abdominal:     General: Bowel sounds are normal. There is no distension.     Palpations: Abdomen is soft.     Tenderness: There is no abdominal tenderness.  Musculoskeletal:     Cervical back: Normal range of motion and neck supple.  Skin:    General: Skin is warm.  Neurological:     Mental Status: She is alert and oriented to person, place, and time.     Comments: Speech clear. Able to follow command without difficulty. RUE 4/5 prox to distal with mild PD. RLE 3/5 prox to distal. Sensory loss along right face, arm and leg. LUE/LLE 5/5.  good insight and awareness. Functional memory.   Psychiatric:        Mood and Affect: Mood normal.        Behavior: Behavior normal.     Results for orders placed or performed during the hospital encounter of 07/12/20 (from the past 48 hour(s))  Glucose, capillary     Status: Abnormal   Collection Time: 07/17/20  3:51 PM  Result Value Ref Range   Glucose-Capillary 134 (H) 70 - 99 mg/dL    Comment: Glucose reference range applies only to samples taken after fasting for at least 8 hours.   Comment 1 Notify RN   Glucose, capillary     Status: Abnormal   Collection Time: 07/17/20  9:33 PM  Result Value Ref Range   Glucose-Capillary 146 (H) 70 - 99 mg/dL    Comment: Glucose reference range applies only to samples taken after fasting for at least 8 hours.  Glucose, capillary     Status: Abnormal   Collection Time: 07/18/20  6:01 AM  Result Value Ref Range   Glucose-Capillary 153 (H) 70 - 99 mg/dL    Comment: Glucose reference range applies only to samples taken  after fasting for at least 8 hours.  Glucose, capillary     Status: Abnormal   Collection Time: 07/18/20 11:19 AM  Result Value Ref Range   Glucose-Capillary 194 (H) 70 - 99 mg/dL    Comment: Glucose reference range applies only to samples taken after fasting for at least 8 hours.  Glucose, capillary     Status: Abnormal   Collection Time: 07/18/20  4:41 PM  Result Value Ref Range   Glucose-Capillary 143 (H) 70 - 99 mg/dL    Comment: Glucose reference range applies only to samples taken after fasting for at least 8 hours.  Glucose, capillary     Status: Abnormal   Collection Time: 07/18/20  9:19 PM  Result Value Ref Range   Glucose-Capillary 158 (H) 70 - 99 mg/dL  Comment: Glucose reference range applies only to samples taken after fasting for at least 8 hours.  Glucose, capillary     Status: Abnormal   Collection Time: 07/19/20  5:41 AM  Result Value Ref Range   Glucose-Capillary 150 (H) 70 - 99 mg/dL    Comment: Glucose reference range applies only to samples taken after fasting for at least 8 hours.   No results found.     Medical Problem List and Plan: 1.  Right hemiparesis and functional deficits secondary to left thalamic infarct  -patient may shower  -ELOS/Goals: mod I, 7-10 days 2.  Antithrombotics: -DVT/anticoagulation:  Pharmaceutical: Lovenox  -antiplatelet therapy: DAPT X 3 weeks followed by ASA alone 3. Pain Management: N/A 4. Mood: LCSW to follow for evaluation and support.   -antipsychotic agents: N/A 5. Neuropsych: This patient is capable of making decisions on her own behalf. 6. Skin/Wound Care: Routine pressure relief measures.  7. Fluids/Electrolytes/Nutrition: Monitor I/O. Check lytes in am.  8. T2DM: Hgb a1C- 11.6 and poorly controlled. Has not been on any meds for years.   Continue Lantus insulin--willing to go home on this. Monitor BS ac/hs and continue to use SSI for tighter BS control as indicated. Consult dietician for dietary education. Pt wants to  improve her control and diabetes awareness.   9. HTN: Monitor BP tid- denies history of hypertension.  10 Schizophrenia/Bipolar d/o/PTSD:  Reports that it has been managed with psychotherapy.  11. OSA: Continue CPAP 12. Erythrocytosis:13.8-->16.0 acute rise during admission and question accuracy. Will monitor for trend.  13. Dyslipidemia: On Lipitor      Bary Leriche, PA-C 07/19/2020

## 2020-07-19 NOTE — H&P (Signed)
Physical Medicine and Rehabilitation Admission H&P        Chief Complaint  Patient presents with  . Stroke with functional deficits.       HPI: [name] [name] is a 59 year old female with history of T2DM-diet controlled,  HTN, fatty liver, Schizoaffective d/o who was admitted on 07/12/20 with HA, right sided weakness and numbness as well as difficulty speaking. UDS positive for THC. 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  MRI brain showed small acute/early subacute left thalamic infarct. She was out of window for tPA.    2D echo showed EF 70-75% with grade II DD and degenerative MV. Dr. Leonie Man felt that stroke due to small vessel disease and recommended  DAPT X 3 weeks followed by ASA alone.  She was placed in sleep smart study which was positive for OSA and CPAP ordered for use. Therapy ongoing and patient continues to be limited by left sided weakness as well as deficits with comprehension and processing. CIR recommended due to functional deficits.       Review of Systems  Constitutional: Negative for chills and fever.  HENT: Positive for congestion. Negative for hearing loss, sinus pain, sore throat and tinnitus.   Eyes: Negative for blurred vision and double vision.  Respiratory: Negative for cough and hemoptysis.   Cardiovascular: Negative for chest pain and palpitations.  Gastrointestinal: Negative for constipation, heartburn and nausea.  Genitourinary: Negative for dysuria and urgency.  Musculoskeletal: Negative for joint pain and myalgias.  Skin: Negative for itching and rash.  Neurological: Positive for speech change and focal weakness. Negative for dizziness and headaches (ONE migraine 20 years ago. ).  Psychiatric/Behavioral: Negative for depression. The patient is not nervous/anxious.           Past Medical History:  Diagnosis Date  . Chickenpox    .  Complication of anesthesia      anxiety and panic  . Diabetes mellitus without complication ([place])      diet controlled, no longer needs meds  . Elevated liver enzymes      ABD US done 08/2016: fatty liver  . Fatty liver      ABD US done 08/2016: fatty liver  . Hyperlipidemia      no longer needs meds  . Hypertension      pt states no longer needs meds  . Hypothyroidism    . OA (osteoarthritis) of hip      right  . Schizophrenia ([place])    . Thyroid disease      no meds           Past Surgical History:  Procedure Laterality Date  . BREAST BIOPSY        needle bx more than 15 yrs benign, no scar visible   . LACERATION REPAIR N/A 12/13/2017    Procedure: Closure of nasal lacerations with one rotator graft and one advanced flap with skin graft, closure of left nasal laceration, closure of left ear.;  Surgeon: [name], MD;  Location: [facility];  Service: ENT;  Laterality: N/A;           Family History  Problem Relation Age of Onset  . Alcohol abuse Mother    . Arthritis Mother    . Diabetes Mother    . Hypertension Mother    . Hyperlipidemia Mother    . Alcohol abuse  Father    . Arthritis Father    . Asthma Father    . Heart disease Father    . Hypertension Father    . Hyperlipidemia Father    . Alcohol abuse Brother    . Arthritis Brother    . Drug abuse Brother        Social History:  Married. Disabled due to psch issues.  Husband legally blind. She reports that she quit smoking about 17 years ago. She has never used smokeless tobacco. She reports that has a beer 1-2x week. Uses chewable marijuana for sleep.          Allergies  Allergen Reactions  . Citric Acid    . Dairy Aid [Lactase]      Medications Prior to Admission  Medication Sig Dispense Refill  . ibuprofen (ADVIL,MOTRIN) 200 MG tablet Take 800 mg by mouth daily as needed.      . Probiotic Product (PROBIOTIC DAILY PO) Take 1 capsule by mouth daily.          Drug Regimen Review  Drug regimen was  reviewed and remains appropriate with no significant issues identified   Home: Home Living Family/patient expects to be discharged to:: Private residence Living Arrangements: Spouse/significant other Available Help at Discharge: Family, Available 24 hours/day Type of Home: House Home Access: Stairs to enter [employer] of Steps: 5 Entrance Stairs-Rails: Right, Left, Can reach both Home Layout: One level Bathroom Shower/Tub: Tub only, Multimedia programmer: Standard Home Equipment: None  Lives With: Spouse   Functional History: Prior Function Level of Independence: Independent Comments: Very active. Likes to kayak and bike   Functional Status:  Mobility: Bed Mobility Overal bed mobility: Needs Assistance Bed Mobility: Supine to Sit Supine to sit: Supervision General bed mobility comments: Pt was OOB in the recliner chair upon PT arrival Transfers Overall transfer level: Needs assistance Equipment used: None Transfers: Sit to/from Stand Sit to Stand: Supervision General transfer comment:  (Practiced sit-stand multiple times for exercise without UE ) Ambulation/Gait Ambulation/Gait assistance: Min guard Gait Distance (Feet): 80 Feet Assistive device: Straight cane Gait Pattern/deviations: Step-through pattern, Decreased stance time - right, Decreased dorsiflexion - right, Decreased weight shift to right General Gait Details: gait is slow and deliberate especially with RLE placement. When adding distractions while pt walking her gait pattern breaks down and increases risk of losing balance.  Gait velocity: decreased Gait velocity interpretation: <1.8 ft/sec, indicate of risk for recurrent falls   ADL: ADL Overall ADL's : Needs assistance/impaired Eating/Feeding: Minimal assistance, Sitting Eating/Feeding Details (indicate cue type and reason): continues to have difficulty opening containers, cutting food, issued foam build up for trial use and  instructed pt to lead with R hand during eating Grooming: Min guard, Standing, Wash/dry hands Upper Body Bathing: Supervision/ safety, Sitting Lower Body Bathing: Minimal assistance, Sit to/from stand Upper Body Dressing : Minimal assistance, Sitting Lower Body Dressing: Independent, Sitting/lateral leans Lower Body Dressing Details (indicate cue type and reason): socks and shoes, shoes have elastic laces Toilet Transfer: Ambulation, Modified Independent, RW Toilet Transfer Details (indicate cue type and reason): pt has been taking herself to the bathroom routinely during the day Toileting- Clothing Manipulation and Hygiene: Modified independent, Sit to/from stand Functional mobility during ADLs: Supervision/safety, Rolling walker General ADL Comments: pt limited by R sided weakness and decreased coordination, impaired balance    Cognition: Cognition Overall Cognitive Status: Within Functional Limits for tasks assessed Arousal/Alertness: Awake/alert Orientation Level: Oriented X4 Attention: Focused, Sustained Focused Attention:  Appears intact Sustained Attention: Appears intact Memory: Impaired Memory Impairment: Storage deficit, Retrieval deficit, Decreased recall of new information (Immediate: 3/3; delayed: 1/3 with cues: 2/2) Awareness: Appears intact Problem Solving: Appears intact Executive Function: Reasoning, Organizing, Sequencing Cognition Arousal/Alertness: Awake/alert Behavior During Therapy: WFL for tasks assessed/performed Overall Cognitive Status: Within Functional Limits for tasks assessed     Blood pressure (!) 151/78, pulse 80, temperature 98.1 F (36.7 C), temperature source Oral, resp. rate 17, height 5\' 3"  (1.6 m), weight 95.6 kg, SpO2 97 %. Physical Exam Vitals and nursing note reviewed.  Constitutional:      Appearance: Normal appearance. She is obese.  HENT:     Head: Normocephalic and atraumatic.     Right Ear: External ear normal.     Left Ear:  External ear normal.     Mouth/Throat:     Mouth: Mucous membranes are moist.     Pharynx: Oropharynx is clear.  Eyes:     Extraocular Movements: Extraocular movements intact.     Pupils: Pupils are equal, round, and reactive to light.  Cardiovascular:     Rate and Rhythm: Normal rate and regular rhythm.     Heart sounds: No murmur heard.  No friction rub.  Pulmonary:     Effort: Pulmonary effort is normal. No respiratory distress.     Breath sounds: Normal breath sounds.  Abdominal:     General: Bowel sounds are normal. There is no distension.     Palpations: Abdomen is soft.     Tenderness: There is no abdominal tenderness.  Musculoskeletal:     Cervical back: Normal range of motion and neck supple.  Skin:    General: Skin is warm.  Neurological:     Mental Status: She is alert and oriented to person, place, and time.     Comments: Speech clear. Able to follow command without difficulty. RUE 4/5 prox to distal with mild PD. RLE 3/5 prox to distal. Sensory loss along right face, arm and leg. LUE/LLE 5/5.  good insight and awareness. Functional memory.   Psychiatric:        Mood and Affect: Mood normal.        Behavior: Behavior normal.        Lab Results Last 48 Hours        Results for orders placed or performed during the hospital encounter of 07/12/20 (from the past 48 hour(s))  Glucose, capillary     Status: Abnormal    Collection Time: 07/17/20  3:51 PM  Result Value Ref Range    Glucose-Capillary 134 (H) 70 - 99 mg/dL      Comment: Glucose reference range applies only to samples taken after fasting for at least 8 hours.    Comment 1 Notify RN    Glucose, capillary     Status: Abnormal    Collection Time: 07/17/20  9:33 PM  Result Value Ref Range    Glucose-Capillary 146 (H) 70 - 99 mg/dL      Comment: Glucose reference range applies only to samples taken after fasting for at least 8 hours.  Glucose, capillary     Status: Abnormal    Collection Time: 07/18/20  6:01  AM  Result Value Ref Range    Glucose-Capillary 153 (H) 70 - 99 mg/dL      Comment: Glucose reference range applies only to samples taken after fasting for at least 8 hours.  Glucose, capillary     Status: Abnormal    Collection Time: 07/18/20  11:19 AM  Result Value Ref Range    Glucose-Capillary 194 (H) 70 - 99 mg/dL      Comment: Glucose reference range applies only to samples taken after fasting for at least 8 hours.  Glucose, capillary     Status: Abnormal    Collection Time: 07/18/20  4:41 PM  Result Value Ref Range    Glucose-Capillary 143 (H) 70 - 99 mg/dL      Comment: Glucose reference range applies only to samples taken after fasting for at least 8 hours.  Glucose, capillary     Status: Abnormal    Collection Time: 07/18/20  9:19 PM  Result Value Ref Range    Glucose-Capillary 158 (H) 70 - 99 mg/dL      Comment: Glucose reference range applies only to samples taken after fasting for at least 8 hours.  Glucose, capillary     Status: Abnormal    Collection Time: 07/19/20  5:41 AM  Result Value Ref Range    Glucose-Capillary 150 (H) 70 - 99 mg/dL      Comment: Glucose reference range applies only to samples taken after fasting for at least 8 hours.      Imaging Results (Last 48 hours)  No results found.           Medical Problem List and Plan: 1.  Right hemiparesis and functional deficits secondary to left thalamic infarct             -patient may shower             -ELOS/Goals: mod I, 7-10 days 2.  Antithrombotics: -DVT/anticoagulation:  Pharmaceutical: Lovenox             -antiplatelet therapy: DAPT X 3 weeks followed by ASA alone 3. Pain Management: N/A 4. Mood: LCSW to follow for evaluation and support.              -antipsychotic agents: N/A 5. Neuropsych: This patient is capable of making decisions on her own behalf. 6. Skin/Wound Care: Routine pressure relief measures.  7. Fluids/Electrolytes/Nutrition: Monitor I/O. Check lytes in am.  8. T2DM: Hgb a1C-  11.6 and poorly controlled. Has not been on any meds for years.   Continue Lantus insulin--willing to go home on this. Monitor BS ac/hs and continue to use SSI for tighter BS control as indicated. Consult dietician for dietary education. Pt wants to improve her control and diabetes awareness.   9. HTN: Monitor BP tid- denies history of hypertension.  10 Schizophrenia/Bipolar d/o/PTSD:  Reports that it has been managed with psychotherapy.  11. OSA: Continue CPAP 12. Erythrocytosis:13.8-->16.0 acute rise during admission and question accuracy. Will monitor for trend.  21. Dyslipidemia: On Lipitor         Bary Leriche, PA-C 07/19/2020  I have personally performed a face to face diagnostic evaluation of this patient and formulated the key components of the plan.  Additionally, I have personally reviewed laboratory data, imaging studies, as well as relevant notes and concur with the physician assistant's documentation above.  The patient's status has not changed from the original H&P.  Any changes in documentation from the acute care chart have been noted above.  Meredith Staggers, MD, [facility]

## 2020-07-19 NOTE — Discharge Summary (Signed)
Discharge Summary  [name] [name] DXI:[id number] DOB: 1961-03-09  PCP: [name] Rasmussen, MD  Admit date: 07/12/2020 Discharge date: 07/19/2020  Time spent: 40 mins  Recommendations for Outpatient Follow-up:  1. PCP in 1 week with repeat labs and follow-up 2. Neurology in 4 weeks for follow-up  Discharge Diagnoses:  Active Hospital Problems   Diagnosis Date Noted  . Acute ischemic stroke ([place]) 07/13/2020  . Schizoaffective disorder ([place]) 07/14/2020  . Type 2 diabetes mellitus without complication, without long-term current use of insulin ([place]) 11/11/2017  . Mixed hyperlipidemia 11/11/2017    Resolved Hospital Problems  No resolved problems to display.    Discharge Condition: Stable  Diet recommendation: Heart healthy/moderate carb  Vitals:   07/19/20 0545 07/19/20 0813  BP: (!) 144/85 (!) 151/78  Pulse: 69 80  Resp: 17 17  Temp: (!) 97.5 F (36.4 C) 98.1 F (36.7 C)  SpO2: 98% 97%    History of present illness:  [name] [name] Mezgeris a 59 y.o.femalewith medical history significant ofDM2, HTN, HLD. Pt presents to ED with c/o R sided weakness and numbness and difficulty with speech expression. Had headache that started off "like a migraine" but then got worse. Hasnt had a migraine since menopause around age 43. ED Course:MRI brain demonstrates acute ischemic stroke.  Neurology consulted.  Patient admitted for further management.   Today, patient reports better sleep with using trazodone, does report some mild headache earlier on this a.m. denies any shortness of breath, chest pain, abdominal pain, nausea/vomiting, fever/chills.  Patient stable to be transferred over to CIR for further rehab needs.    Hospital Course:  Principal Problem:   Acute ischemic stroke [facility]) Active Problems:   Type 2 diabetes mellitus without complication, without long-term current use of insulin (HCC)   Mixed hyperlipidemia   Schizoaffective disorder ([place])   Acute to subacute  ischemic L thalamic infarct MRI showed above CT angiogram showed no significant large vessel intracranial or extracranial stenosis Echo EF of 70 to 75%, no regional wall motion abnormality, grade 2 diastolic dysfunction.  No atrial level shunt detected by color-flow Doppler LDL 191, A1c 11.6 Neurology consulted, continue aspirin 81, Plavix 75 mg daily for 3 weeks and then aspirin alone PT/OT/SLP  Hypertension BP with fair control No longer on permissive hypertension, monitor closely, may benefit from low-dose BP meds, may be lisinopril, pending blood pressure range  Hyperlipidemia LDL 191 Continue Lipitor 80 mg daily PCP to monitor LFTs given history of fatty liver  Diabetes mellitus type 2 Uncontrolled, with hyperglycemia A1c 11.6 Continue SSI, lantus, start low-dose glipizide 2.5 mg from 07/20/2020 Diabetes coordinator consulted, spoke to team on 07/19/2020, will continue to follow while at CIR and upon discharge  Marijuana use UDS showed marijuana  OSA Patient tested positive for sleep apnea on the overnight monitor Continue CPAP  Erythrocytosis Likely associated with OSA May require outpatient heme-onc, if persistent  Obesity Lifestyle modification advised  Schizoaffective disorder Stable, managed without medications        Malnutrition Type:      Malnutrition Characteristics:      Nutrition Interventions:      Estimated body mass index is 37.34 kg/m as calculated from the following:   Height as of this encounter: 5\' 3"  (1.6 m).   Weight as of this encounter: 95.6 kg.    Procedures:  None  Consultations:  Neurology  Diabetes coordinator  Discharge Exam: BP (!) 151/78 (BP Location: Right Arm)   Pulse 80   Temp 98.1 F (  larynx and major salivary glands. No cervical lymphadenopathy. Unremarkable thyroid gland. UPPER CHEST: Incompletely  visualized 4 mm right upper lobe subpleural nodule. AORTIC ARCH: There is calcific atherosclerosis of the aortic arch. There is no aneurysm, dissection or hemodynamically significant stenosis of the visualized portion of the aorta. Conventional 3 vessel aortic branching pattern. The visualized proximal subclavian arteries are widely patent. RIGHT CAROTID SYSTEM: No dissection, occlusion or aneurysm. Mild atherosclerotic calcification at the carotid bifurcation without hemodynamically significant stenosis. LEFT CAROTID SYSTEM: No dissection, occlusion or aneurysm. Mild atherosclerotic calcification at the carotid bifurcation without hemodynamically significant stenosis. VERTEBRAL ARTERIES: Right dominant configuration. Left vertebral artery is diffusely diminutive. Both origins are clearly patent. There is no dissection, occlusion or flow-limiting stenosis to the skull base (V1-V3 segments). CTA HEAD FINDINGS POSTERIOR CIRCULATION: --Vertebral arteries: Normal right. There is no opacified left V4 segment. --Inferior cerebellar arteries: Normal. --Basilar artery: Diminutive --Superior cerebellar arteries: Normal. --Posterior cerebral arteries (PCA): Right fetal origin. Normal left with conventional origin. ANTERIOR CIRCULATION: --Intracranial internal carotid arteries: Normal. --Anterior cerebral arteries (ACA): Normal. Both A1 segments are present. Patent anterior communicating artery (a-comm). --Middle cerebral arteries (MCA): Normal. VENOUS SINUSES: As permitted by contrast timing, patent. ANATOMIC VARIANTS: Fetal origin of the right posterior cerebral artery. Review of the MIP images confirms the above findings. CT Brain Perfusion Findings: ASPECTS: 10 CBF (<30%) Volume: 68mL Perfusion (Tmax>6.0s) volume: 50mL Mismatch Volume: 38mL Infarction Location:None IMPRESSION: 1. No emergent large vessel occlusion or high-grade stenosis of the intracranial arteries. 2. Normal CT perfusion. 3. Diffusely diminutive left  vertebral artery. V4 segment may terminate in a small left PICA or be occluded. 4. Aortic Atherosclerosis (ICD10-I70.0). Electronically Signed   By: [name] [name] M.D.   On: 07/12/2020 21:04   ECHOCARDIOGRAM COMPLETE  Result Date: 07/13/2020    ECHOCARDIOGRAM REPORT   Patient Name:   [name] BUDREAU [facility] Date of Exam: 07/13/2020 Medical Rec #:  [id number]           Height:       63.0 in Accession #:    [id number]          Weight:       230.0 lb Date of Birth:  05-06-1961           BSA:          2.052 m Patient Age:    59 years            BP:           173/62 mmHg Patient Gender: F                   HR:           70 bpm. Exam Location:  Inpatient Procedure: 2D Echo, Cardiac Doppler and Color Doppler Indications:    Stroke 434.91 / I163.9  History:        Patient has no prior history of Echocardiogram examinations.  Sonographer:    Jonelle Sidle Dance Referring Phys: Gary  1. Left ventricular ejection fraction, by estimation, is 70 to 75%. The left ventricle has hyperdynamic function. The left ventricle has no regional wall motion abnormalities. There is mild concentric left ventricular hypertrophy. Left ventricular diastolic parameters are consistent with Grade II diastolic dysfunction (pseudonormalization). Elevated left atrial pressure.  2. Right ventricular systolic function is normal. The right ventricular size is normal.  3. The mitral valve is degenerative. No evidence of mitral valve regurgitation. No evidence of mitral stenosis.  4. The  larynx and major salivary glands. No cervical lymphadenopathy. Unremarkable thyroid gland. UPPER CHEST: Incompletely  visualized 4 mm right upper lobe subpleural nodule. AORTIC ARCH: There is calcific atherosclerosis of the aortic arch. There is no aneurysm, dissection or hemodynamically significant stenosis of the visualized portion of the aorta. Conventional 3 vessel aortic branching pattern. The visualized proximal subclavian arteries are widely patent. RIGHT CAROTID SYSTEM: No dissection, occlusion or aneurysm. Mild atherosclerotic calcification at the carotid bifurcation without hemodynamically significant stenosis. LEFT CAROTID SYSTEM: No dissection, occlusion or aneurysm. Mild atherosclerotic calcification at the carotid bifurcation without hemodynamically significant stenosis. VERTEBRAL ARTERIES: Right dominant configuration. Left vertebral artery is diffusely diminutive. Both origins are clearly patent. There is no dissection, occlusion or flow-limiting stenosis to the skull base (V1-V3 segments). CTA HEAD FINDINGS POSTERIOR CIRCULATION: --Vertebral arteries: Normal right. There is no opacified left V4 segment. --Inferior cerebellar arteries: Normal. --Basilar artery: Diminutive --Superior cerebellar arteries: Normal. --Posterior cerebral arteries (PCA): Right fetal origin. Normal left with conventional origin. ANTERIOR CIRCULATION: --Intracranial internal carotid arteries: Normal. --Anterior cerebral arteries (ACA): Normal. Both A1 segments are present. Patent anterior communicating artery (a-comm). --Middle cerebral arteries (MCA): Normal. VENOUS SINUSES: As permitted by contrast timing, patent. ANATOMIC VARIANTS: Fetal origin of the right posterior cerebral artery. Review of the MIP images confirms the above findings. CT Brain Perfusion Findings: ASPECTS: 10 CBF (<30%) Volume: 68mL Perfusion (Tmax>6.0s) volume: 50mL Mismatch Volume: 38mL Infarction Location:None IMPRESSION: 1. No emergent large vessel occlusion or high-grade stenosis of the intracranial arteries. 2. Normal CT perfusion. 3. Diffusely diminutive left  vertebral artery. V4 segment may terminate in a small left PICA or be occluded. 4. Aortic Atherosclerosis (ICD10-I70.0). Electronically Signed   By: [name] [name] M.D.   On: 07/12/2020 21:04   ECHOCARDIOGRAM COMPLETE  Result Date: 07/13/2020    ECHOCARDIOGRAM REPORT   Patient Name:   [name] BUDREAU [facility] Date of Exam: 07/13/2020 Medical Rec #:  [id number]           Height:       63.0 in Accession #:    [id number]          Weight:       230.0 lb Date of Birth:  05-06-1961           BSA:          2.052 m Patient Age:    59 years            BP:           173/62 mmHg Patient Gender: F                   HR:           70 bpm. Exam Location:  Inpatient Procedure: 2D Echo, Cardiac Doppler and Color Doppler Indications:    Stroke 434.91 / I163.9  History:        Patient has no prior history of Echocardiogram examinations.  Sonographer:    Jonelle Sidle Dance Referring Phys: Gary  1. Left ventricular ejection fraction, by estimation, is 70 to 75%. The left ventricle has hyperdynamic function. The left ventricle has no regional wall motion abnormalities. There is mild concentric left ventricular hypertrophy. Left ventricular diastolic parameters are consistent with Grade II diastolic dysfunction (pseudonormalization). Elevated left atrial pressure.  2. Right ventricular systolic function is normal. The right ventricular size is normal.  3. The mitral valve is degenerative. No evidence of mitral valve regurgitation. No evidence of mitral stenosis.  4. The  larynx and major salivary glands. No cervical lymphadenopathy. Unremarkable thyroid gland. UPPER CHEST: Incompletely  visualized 4 mm right upper lobe subpleural nodule. AORTIC ARCH: There is calcific atherosclerosis of the aortic arch. There is no aneurysm, dissection or hemodynamically significant stenosis of the visualized portion of the aorta. Conventional 3 vessel aortic branching pattern. The visualized proximal subclavian arteries are widely patent. RIGHT CAROTID SYSTEM: No dissection, occlusion or aneurysm. Mild atherosclerotic calcification at the carotid bifurcation without hemodynamically significant stenosis. LEFT CAROTID SYSTEM: No dissection, occlusion or aneurysm. Mild atherosclerotic calcification at the carotid bifurcation without hemodynamically significant stenosis. VERTEBRAL ARTERIES: Right dominant configuration. Left vertebral artery is diffusely diminutive. Both origins are clearly patent. There is no dissection, occlusion or flow-limiting stenosis to the skull base (V1-V3 segments). CTA HEAD FINDINGS POSTERIOR CIRCULATION: --Vertebral arteries: Normal right. There is no opacified left V4 segment. --Inferior cerebellar arteries: Normal. --Basilar artery: Diminutive --Superior cerebellar arteries: Normal. --Posterior cerebral arteries (PCA): Right fetal origin. Normal left with conventional origin. ANTERIOR CIRCULATION: --Intracranial internal carotid arteries: Normal. --Anterior cerebral arteries (ACA): Normal. Both A1 segments are present. Patent anterior communicating artery (a-comm). --Middle cerebral arteries (MCA): Normal. VENOUS SINUSES: As permitted by contrast timing, patent. ANATOMIC VARIANTS: Fetal origin of the right posterior cerebral artery. Review of the MIP images confirms the above findings. CT Brain Perfusion Findings: ASPECTS: 10 CBF (<30%) Volume: 68mL Perfusion (Tmax>6.0s) volume: 50mL Mismatch Volume: 38mL Infarction Location:None IMPRESSION: 1. No emergent large vessel occlusion or high-grade stenosis of the intracranial arteries. 2. Normal CT perfusion. 3. Diffusely diminutive left  vertebral artery. V4 segment may terminate in a small left PICA or be occluded. 4. Aortic Atherosclerosis (ICD10-I70.0). Electronically Signed   By: [name] [name] M.D.   On: 07/12/2020 21:04   ECHOCARDIOGRAM COMPLETE  Result Date: 07/13/2020    ECHOCARDIOGRAM REPORT   Patient Name:   [name] BUDREAU [facility] Date of Exam: 07/13/2020 Medical Rec #:  [id number]           Height:       63.0 in Accession #:    [id number]          Weight:       230.0 lb Date of Birth:  05-06-1961           BSA:          2.052 m Patient Age:    59 years            BP:           173/62 mmHg Patient Gender: F                   HR:           70 bpm. Exam Location:  Inpatient Procedure: 2D Echo, Cardiac Doppler and Color Doppler Indications:    Stroke 434.91 / I163.9  History:        Patient has no prior history of Echocardiogram examinations.  Sonographer:    Jonelle Sidle Dance Referring Phys: Gary  1. Left ventricular ejection fraction, by estimation, is 70 to 75%. The left ventricle has hyperdynamic function. The left ventricle has no regional wall motion abnormalities. There is mild concentric left ventricular hypertrophy. Left ventricular diastolic parameters are consistent with Grade II diastolic dysfunction (pseudonormalization). Elevated left atrial pressure.  2. Right ventricular systolic function is normal. The right ventricular size is normal.  3. The mitral valve is degenerative. No evidence of mitral valve regurgitation. No evidence of mitral stenosis.  4. The  larynx and major salivary glands. No cervical lymphadenopathy. Unremarkable thyroid gland. UPPER CHEST: Incompletely  visualized 4 mm right upper lobe subpleural nodule. AORTIC ARCH: There is calcific atherosclerosis of the aortic arch. There is no aneurysm, dissection or hemodynamically significant stenosis of the visualized portion of the aorta. Conventional 3 vessel aortic branching pattern. The visualized proximal subclavian arteries are widely patent. RIGHT CAROTID SYSTEM: No dissection, occlusion or aneurysm. Mild atherosclerotic calcification at the carotid bifurcation without hemodynamically significant stenosis. LEFT CAROTID SYSTEM: No dissection, occlusion or aneurysm. Mild atherosclerotic calcification at the carotid bifurcation without hemodynamically significant stenosis. VERTEBRAL ARTERIES: Right dominant configuration. Left vertebral artery is diffusely diminutive. Both origins are clearly patent. There is no dissection, occlusion or flow-limiting stenosis to the skull base (V1-V3 segments). CTA HEAD FINDINGS POSTERIOR CIRCULATION: --Vertebral arteries: Normal right. There is no opacified left V4 segment. --Inferior cerebellar arteries: Normal. --Basilar artery: Diminutive --Superior cerebellar arteries: Normal. --Posterior cerebral arteries (PCA): Right fetal origin. Normal left with conventional origin. ANTERIOR CIRCULATION: --Intracranial internal carotid arteries: Normal. --Anterior cerebral arteries (ACA): Normal. Both A1 segments are present. Patent anterior communicating artery (a-comm). --Middle cerebral arteries (MCA): Normal. VENOUS SINUSES: As permitted by contrast timing, patent. ANATOMIC VARIANTS: Fetal origin of the right posterior cerebral artery. Review of the MIP images confirms the above findings. CT Brain Perfusion Findings: ASPECTS: 10 CBF (<30%) Volume: 68mL Perfusion (Tmax>6.0s) volume: 50mL Mismatch Volume: 38mL Infarction Location:None IMPRESSION: 1. No emergent large vessel occlusion or high-grade stenosis of the intracranial arteries. 2. Normal CT perfusion. 3. Diffusely diminutive left  vertebral artery. V4 segment may terminate in a small left PICA or be occluded. 4. Aortic Atherosclerosis (ICD10-I70.0). Electronically Signed   By: [name] [name] M.D.   On: 07/12/2020 21:04   ECHOCARDIOGRAM COMPLETE  Result Date: 07/13/2020    ECHOCARDIOGRAM REPORT   Patient Name:   [name] BUDREAU [facility] Date of Exam: 07/13/2020 Medical Rec #:  [id number]           Height:       63.0 in Accession #:    [id number]          Weight:       230.0 lb Date of Birth:  05-06-1961           BSA:          2.052 m Patient Age:    59 years            BP:           173/62 mmHg Patient Gender: F                   HR:           70 bpm. Exam Location:  Inpatient Procedure: 2D Echo, Cardiac Doppler and Color Doppler Indications:    Stroke 434.91 / I163.9  History:        Patient has no prior history of Echocardiogram examinations.  Sonographer:    Jonelle Sidle Dance Referring Phys: Gary  1. Left ventricular ejection fraction, by estimation, is 70 to 75%. The left ventricle has hyperdynamic function. The left ventricle has no regional wall motion abnormalities. There is mild concentric left ventricular hypertrophy. Left ventricular diastolic parameters are consistent with Grade II diastolic dysfunction (pseudonormalization). Elevated left atrial pressure.  2. Right ventricular systolic function is normal. The right ventricular size is normal.  3. The mitral valve is degenerative. No evidence of mitral valve regurgitation. No evidence of mitral stenosis.  4. The  Discharge Summary  [name] [name] DXI:[id number] DOB: 1961-03-09  PCP: [name] Rasmussen, MD  Admit date: 07/12/2020 Discharge date: 07/19/2020  Time spent: 40 mins  Recommendations for Outpatient Follow-up:  1. PCP in 1 week with repeat labs and follow-up 2. Neurology in 4 weeks for follow-up  Discharge Diagnoses:  Active Hospital Problems   Diagnosis Date Noted  . Acute ischemic stroke ([place]) 07/13/2020  . Schizoaffective disorder ([place]) 07/14/2020  . Type 2 diabetes mellitus without complication, without long-term current use of insulin ([place]) 11/11/2017  . Mixed hyperlipidemia 11/11/2017    Resolved Hospital Problems  No resolved problems to display.    Discharge Condition: Stable  Diet recommendation: Heart healthy/moderate carb  Vitals:   07/19/20 0545 07/19/20 0813  BP: (!) 144/85 (!) 151/78  Pulse: 69 80  Resp: 17 17  Temp: (!) 97.5 F (36.4 C) 98.1 F (36.7 C)  SpO2: 98% 97%    History of present illness:  [name] [name] Mezgeris a 59 y.o.femalewith medical history significant ofDM2, HTN, HLD. Pt presents to ED with c/o R sided weakness and numbness and difficulty with speech expression. Had headache that started off "like a migraine" but then got worse. Hasnt had a migraine since menopause around age 43. ED Course:MRI brain demonstrates acute ischemic stroke.  Neurology consulted.  Patient admitted for further management.   Today, patient reports better sleep with using trazodone, does report some mild headache earlier on this a.m. denies any shortness of breath, chest pain, abdominal pain, nausea/vomiting, fever/chills.  Patient stable to be transferred over to CIR for further rehab needs.    Hospital Course:  Principal Problem:   Acute ischemic stroke [facility]) Active Problems:   Type 2 diabetes mellitus without complication, without long-term current use of insulin (HCC)   Mixed hyperlipidemia   Schizoaffective disorder ([place])   Acute to subacute  ischemic L thalamic infarct MRI showed above CT angiogram showed no significant large vessel intracranial or extracranial stenosis Echo EF of 70 to 75%, no regional wall motion abnormality, grade 2 diastolic dysfunction.  No atrial level shunt detected by color-flow Doppler LDL 191, A1c 11.6 Neurology consulted, continue aspirin 81, Plavix 75 mg daily for 3 weeks and then aspirin alone PT/OT/SLP  Hypertension BP with fair control No longer on permissive hypertension, monitor closely, may benefit from low-dose BP meds, may be lisinopril, pending blood pressure range  Hyperlipidemia LDL 191 Continue Lipitor 80 mg daily PCP to monitor LFTs given history of fatty liver  Diabetes mellitus type 2 Uncontrolled, with hyperglycemia A1c 11.6 Continue SSI, lantus, start low-dose glipizide 2.5 mg from 07/20/2020 Diabetes coordinator consulted, spoke to team on 07/19/2020, will continue to follow while at CIR and upon discharge  Marijuana use UDS showed marijuana  OSA Patient tested positive for sleep apnea on the overnight monitor Continue CPAP  Erythrocytosis Likely associated with OSA May require outpatient heme-onc, if persistent  Obesity Lifestyle modification advised  Schizoaffective disorder Stable, managed without medications        Malnutrition Type:      Malnutrition Characteristics:      Nutrition Interventions:      Estimated body mass index is 37.34 kg/m as calculated from the following:   Height as of this encounter: 5\' 3"  (1.6 m).   Weight as of this encounter: 95.6 kg.    Procedures:  None  Consultations:  Neurology  Diabetes coordinator  Discharge Exam: BP (!) 151/78 (BP Location: Right Arm)   Pulse 80   Temp 98.1 F (  Discharge Summary  [name] [name] DXI:[id number] DOB: 1961-03-09  PCP: [name] Rasmussen, MD  Admit date: 07/12/2020 Discharge date: 07/19/2020  Time spent: 40 mins  Recommendations for Outpatient Follow-up:  1. PCP in 1 week with repeat labs and follow-up 2. Neurology in 4 weeks for follow-up  Discharge Diagnoses:  Active Hospital Problems   Diagnosis Date Noted  . Acute ischemic stroke ([place]) 07/13/2020  . Schizoaffective disorder ([place]) 07/14/2020  . Type 2 diabetes mellitus without complication, without long-term current use of insulin ([place]) 11/11/2017  . Mixed hyperlipidemia 11/11/2017    Resolved Hospital Problems  No resolved problems to display.    Discharge Condition: Stable  Diet recommendation: Heart healthy/moderate carb  Vitals:   07/19/20 0545 07/19/20 0813  BP: (!) 144/85 (!) 151/78  Pulse: 69 80  Resp: 17 17  Temp: (!) 97.5 F (36.4 C) 98.1 F (36.7 C)  SpO2: 98% 97%    History of present illness:  [name] [name] Mezgeris a 59 y.o.femalewith medical history significant ofDM2, HTN, HLD. Pt presents to ED with c/o R sided weakness and numbness and difficulty with speech expression. Had headache that started off "like a migraine" but then got worse. Hasnt had a migraine since menopause around age 43. ED Course:MRI brain demonstrates acute ischemic stroke.  Neurology consulted.  Patient admitted for further management.   Today, patient reports better sleep with using trazodone, does report some mild headache earlier on this a.m. denies any shortness of breath, chest pain, abdominal pain, nausea/vomiting, fever/chills.  Patient stable to be transferred over to CIR for further rehab needs.    Hospital Course:  Principal Problem:   Acute ischemic stroke [facility]) Active Problems:   Type 2 diabetes mellitus without complication, without long-term current use of insulin (HCC)   Mixed hyperlipidemia   Schizoaffective disorder ([place])   Acute to subacute  ischemic L thalamic infarct MRI showed above CT angiogram showed no significant large vessel intracranial or extracranial stenosis Echo EF of 70 to 75%, no regional wall motion abnormality, grade 2 diastolic dysfunction.  No atrial level shunt detected by color-flow Doppler LDL 191, A1c 11.6 Neurology consulted, continue aspirin 81, Plavix 75 mg daily for 3 weeks and then aspirin alone PT/OT/SLP  Hypertension BP with fair control No longer on permissive hypertension, monitor closely, may benefit from low-dose BP meds, may be lisinopril, pending blood pressure range  Hyperlipidemia LDL 191 Continue Lipitor 80 mg daily PCP to monitor LFTs given history of fatty liver  Diabetes mellitus type 2 Uncontrolled, with hyperglycemia A1c 11.6 Continue SSI, lantus, start low-dose glipizide 2.5 mg from 07/20/2020 Diabetes coordinator consulted, spoke to team on 07/19/2020, will continue to follow while at CIR and upon discharge  Marijuana use UDS showed marijuana  OSA Patient tested positive for sleep apnea on the overnight monitor Continue CPAP  Erythrocytosis Likely associated with OSA May require outpatient heme-onc, if persistent  Obesity Lifestyle modification advised  Schizoaffective disorder Stable, managed without medications        Malnutrition Type:      Malnutrition Characteristics:      Nutrition Interventions:      Estimated body mass index is 37.34 kg/m as calculated from the following:   Height as of this encounter: 5\' 3"  (1.6 m).   Weight as of this encounter: 95.6 kg.    Procedures:  None  Consultations:  Neurology  Diabetes coordinator  Discharge Exam: BP (!) 151/78 (BP Location: Right Arm)   Pulse 80   Temp 98.1 F (  Discharge Summary  [name] [name] DXI:[id number] DOB: 1961-03-09  PCP: [name] Rasmussen, MD  Admit date: 07/12/2020 Discharge date: 07/19/2020  Time spent: 40 mins  Recommendations for Outpatient Follow-up:  1. PCP in 1 week with repeat labs and follow-up 2. Neurology in 4 weeks for follow-up  Discharge Diagnoses:  Active Hospital Problems   Diagnosis Date Noted  . Acute ischemic stroke ([place]) 07/13/2020  . Schizoaffective disorder ([place]) 07/14/2020  . Type 2 diabetes mellitus without complication, without long-term current use of insulin ([place]) 11/11/2017  . Mixed hyperlipidemia 11/11/2017    Resolved Hospital Problems  No resolved problems to display.    Discharge Condition: Stable  Diet recommendation: Heart healthy/moderate carb  Vitals:   07/19/20 0545 07/19/20 0813  BP: (!) 144/85 (!) 151/78  Pulse: 69 80  Resp: 17 17  Temp: (!) 97.5 F (36.4 C) 98.1 F (36.7 C)  SpO2: 98% 97%    History of present illness:  [name] [name] Mezgeris a 59 y.o.femalewith medical history significant ofDM2, HTN, HLD. Pt presents to ED with c/o R sided weakness and numbness and difficulty with speech expression. Had headache that started off "like a migraine" but then got worse. Hasnt had a migraine since menopause around age 43. ED Course:MRI brain demonstrates acute ischemic stroke.  Neurology consulted.  Patient admitted for further management.   Today, patient reports better sleep with using trazodone, does report some mild headache earlier on this a.m. denies any shortness of breath, chest pain, abdominal pain, nausea/vomiting, fever/chills.  Patient stable to be transferred over to CIR for further rehab needs.    Hospital Course:  Principal Problem:   Acute ischemic stroke [facility]) Active Problems:   Type 2 diabetes mellitus without complication, without long-term current use of insulin (HCC)   Mixed hyperlipidemia   Schizoaffective disorder ([place])   Acute to subacute  ischemic L thalamic infarct MRI showed above CT angiogram showed no significant large vessel intracranial or extracranial stenosis Echo EF of 70 to 75%, no regional wall motion abnormality, grade 2 diastolic dysfunction.  No atrial level shunt detected by color-flow Doppler LDL 191, A1c 11.6 Neurology consulted, continue aspirin 81, Plavix 75 mg daily for 3 weeks and then aspirin alone PT/OT/SLP  Hypertension BP with fair control No longer on permissive hypertension, monitor closely, may benefit from low-dose BP meds, may be lisinopril, pending blood pressure range  Hyperlipidemia LDL 191 Continue Lipitor 80 mg daily PCP to monitor LFTs given history of fatty liver  Diabetes mellitus type 2 Uncontrolled, with hyperglycemia A1c 11.6 Continue SSI, lantus, start low-dose glipizide 2.5 mg from 07/20/2020 Diabetes coordinator consulted, spoke to team on 07/19/2020, will continue to follow while at CIR and upon discharge  Marijuana use UDS showed marijuana  OSA Patient tested positive for sleep apnea on the overnight monitor Continue CPAP  Erythrocytosis Likely associated with OSA May require outpatient heme-onc, if persistent  Obesity Lifestyle modification advised  Schizoaffective disorder Stable, managed without medications        Malnutrition Type:      Malnutrition Characteristics:      Nutrition Interventions:      Estimated body mass index is 37.34 kg/m as calculated from the following:   Height as of this encounter: 5\' 3"  (1.6 m).   Weight as of this encounter: 95.6 kg.    Procedures:  None  Consultations:  Neurology  Diabetes coordinator  Discharge Exam: BP (!) 151/78 (BP Location: Right Arm)   Pulse 80   Temp 98.1 F (

## 2020-07-19 NOTE — Care Management Important Message (Signed)
Important Message  Patient Details  Name: [name] [name] MRN: [id number] Date of Birth: 1961-07-16   Medicare Important Message Given:  Yes     [name] [name] 07/19/2020, 1:58 PM

## 2020-07-20 ENCOUNTER — Inpatient Hospital Stay (HOSPITAL_COMMUNITY): Payer: Medicare PPO | Admitting: Physical Therapy

## 2020-07-20 ENCOUNTER — Inpatient Hospital Stay (HOSPITAL_COMMUNITY): Payer: Medicare PPO | Admitting: Occupational Therapy

## 2020-07-20 ENCOUNTER — Inpatient Hospital Stay (HOSPITAL_COMMUNITY): Payer: Medicare PPO

## 2020-07-20 LAB — COMPREHENSIVE METABOLIC PANEL
ALT: 67 U/L — ABNORMAL HIGH (ref 0–44)
AST: 71 U/L — ABNORMAL HIGH (ref 15–41)
Albumin: 3.6 g/dL (ref 3.5–5.0)
Alkaline Phosphatase: 84 U/L (ref 38–126)
Anion gap: 10 (ref 5–15)
BUN: 13 mg/dL (ref 6–20)
CO2: 22 mmol/L (ref 22–32)
Calcium: 9.3 mg/dL (ref 8.9–10.3)
Chloride: 105 mmol/L (ref 98–111)
Creatinine, Ser: 0.65 mg/dL (ref 0.44–1.00)
GFR calc Af Amer: >60 mL/min (ref >60)
GFR calc non Af Amer: >60 mL/min (ref >60)
Glucose, Bld: 162 mg/dL — ABNORMAL HIGH (ref 70–99)
Potassium: 4.6 mmol/L (ref 3.5–5.1)
Sodium: 137 mmol/L (ref 135–145)
Total Bilirubin: 1.3 mg/dL — ABNORMAL HIGH (ref 0.3–1.2)
Total Protein: 7 g/dL (ref 6.5–8.1)

## 2020-07-20 LAB — GLUCOSE, CAPILLARY
Glucose-Capillary: 125 mg/dL — ABNORMAL HIGH (ref 70–99)
Glucose-Capillary: 118 mg/dL — ABNORMAL HIGH (ref 70–99)
Glucose-Capillary: 152 mg/dL — ABNORMAL HIGH (ref 70–99)
Glucose-Capillary: 122 mg/dL — ABNORMAL HIGH (ref 70–99)

## 2020-07-20 LAB — CBC WITH DIFFERENTIAL/PLATELET
Abs Immature Granulocytes: 0.03 10*3/uL (ref 0.00–0.07)
Basophils Absolute: 0.1 10*3/uL (ref 0.0–0.1)
Basophils Relative: 1 %
Eosinophils Absolute: 0.1 10*3/uL (ref 0.0–0.5)
Eosinophils Relative: 1 %
HCT: 47.8 % — ABNORMAL HIGH (ref 36.0–46.0)
Hemoglobin: 15.4 g/dL — ABNORMAL HIGH (ref 12.0–15.0)
Immature Granulocytes: 0 %
Lymphocytes Relative: 39 %
Lymphs Abs: 2.8 10*3/uL (ref 0.7–4.0)
MCH: 30.3 pg (ref 26.0–34.0)
MCHC: 32.2 g/dL (ref 30.0–36.0)
MCV: 93.9 fL (ref 80.0–100.0)
Monocytes Absolute: 0.5 10*3/uL (ref 0.1–1.0)
Monocytes Relative: 7 %
Neutro Abs: 3.8 10*3/uL (ref 1.7–7.7)
Neutrophils Relative %: 52 %
Platelets: 227 10*3/uL (ref 150–400)
RBC: 5.09 MIL/uL (ref 3.87–5.11)
RDW: 12 % (ref 11.5–15.5)
WBC: 7.3 10*3/uL (ref 4.0–10.5)
nRBC: 0 % (ref 0.0–0.2)

## 2020-07-20 MED ORDER — HYDROXYZINE HCL 10 MG PO TABS
10.0000 mg | ORAL_TABLET | Freq: Three times a day (TID) | ORAL | Status: DC | PRN
  Administered 2020-07-20 – 2020-07-21 (×2): 10 mg via ORAL
  Filled 2020-07-20 (×3): qty 1

## 2020-07-20 MED ORDER — TRAMADOL HCL 50 MG PO TABS
50.0000 mg | ORAL_TABLET | Freq: Once | ORAL | Status: AC
  Administered 2020-07-20: 50 mg via ORAL
  Filled 2020-07-20: qty 1

## 2020-07-20 MED ORDER — INSULIN GLARGINE 100 UNIT/ML ~~LOC~~ SOLN
21.0000 [IU] | Freq: Every day | SUBCUTANEOUS | Status: DC
  Administered 2020-07-21 – 2020-07-28 (×8): 21 [IU] via SUBCUTANEOUS
  Filled 2020-07-20 (×8): qty 0.21

## 2020-07-20 NOTE — Progress Notes (Addendum)
Pt c/o new rash to right side of face, no changes in sensation, nurse unable to feel or see rash, pt also reports increase in soreness to right upper shoulder and back that has migrated up neck, reports a "tightness". Notified PA on call. New orders received.

## 2020-07-20 NOTE — Progress Notes (Signed)
Copy of Covid-19 vaccination card in hard chart

## 2020-07-20 NOTE — Evaluation (Signed)
Physical Therapy Assessment and Plan  Patient Details  Name: [name] [name] MRN: [id number] Date of Birth: 12-05-1960  PT Diagnosis: Abnormality of gait, Difficulty walking and Hemiparesis dominant Rehab Potential: Excellent ELOS: 7-10 days   Today's Date: 07/20/2020 PT Individual Time: 1300-1400 PT Individual Time Calculation (min): 60 min    Hospital Problem: Principal Problem:   Thalamic stroke [facility])   Past Medical History:  Past Medical History:  Diagnosis Date  . Chickenpox   . Complication of anesthesia    anxiety and panic  . Diabetes mellitus without complication (HCC)    diet controlled, no longer needs meds  . Elevated liver enzymes    ABD US done 08/2016: fatty liver  . Fatty liver    ABD US done 08/2016: fatty liver  . Hyperlipidemia    no longer needs meds  . Hypertension    pt states no longer needs meds  . Hypothyroidism   . OA (osteoarthritis) of hip    right  . Schizophrenia (HCC)   . Thyroid disease    no meds   Past Surgical History:  Past Surgical History:  Procedure Laterality Date  . BREAST BIOPSY     needle bx more than 15 yrs benign, no scar visible   . LACERATION REPAIR N/A 12/13/2017   Procedure: Closure of nasal lacerations with one rotator graft and one advanced flap with skin graft, closure of left nasal laceration, closure of left ear.;  Surgeon: Suzanna Obey, MD;  Location: [facility];  Service: ENT;  Laterality: N/A;    Assessment & Plan Clinical Impression:  [name] [name] is a 59 year old female with history of T2DM-diet controlled, HTN, fatty liver, Schizoaffective d/o who was admitted on 07/12/20 with HA, right sided weakness and numbness as well as difficulty speaking. UDS positive for THC. 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 MRI brain showed small acute/early subacute left thalamic infarct. She was out of window for tPA.    2D echo showed EF 70-75% with grade II DD and degenerative MV. Dr. Pearlean Brownie felt that stroke due to small vessel disease and recommended DAPT X 3 weeks followed by ASA alone. She was placed in sleep smart study which was positive for OSA and CPAP ordered for use. Therapy ongoing and patient continues to be limited by left sided weakness as well as deficits with comprehension and processing. CIR recommended due to functional deficits. Patient transferred to CIR on 07/19/2020 .   Patient currently requires min with mobility secondary to muscle weakness, decreased cardiorespiratoy endurance, abnormal tone and unbalanced muscle activation and decreased standing balance, decreased postural control, hemiplegia and decreased balance strategies.  Prior to hospitalization, patient was independent  with mobility and lived with Spouse in a House home.  Home access is 5Stairs to enter.  Patient will benefit from skilled PT intervention to maximize safe functional mobility, minimize fall risk and decrease caregiver burden for planned discharge home with 24 hour assist.  Anticipate patient will benefit from follow up [facility] at discharge.  PT - End of Session Activity Tolerance: Tolerates 30+ min activity with multiple rests Endurance Deficit: Yes Endurance Deficit Description: frequent rest breaks during functional activity PT Assessment Rehab Potential (ACUTE/IP ONLY): Excellent PT Patient demonstrates impairments in the following area(s): Balance;Endurance;Motor;Safety;Sensory PT Transfers Functional Problem(s): Bed Mobility;Bed to Chair;Car;Furniture;Floor PT Locomotion Functional Problem(s): Ambulation;Wheelchair Mobility;Stairs PT Plan PT Intensity: Minimum of 1-2 x/day ,  45 to 90 minutes PT Frequency: 5 out of 7 days PT Duration Estimated Length of Stay: 7-10 days PT Treatment/Interventions: Ambulation/gait training;Balance/vestibular training;Community reintegration;Discharge planning;Disease  management/prevention;DME/adaptive equipment instruction;Functional mobility training;Neuromuscular re-education;Pain management;Patient/family education;Psychosocial support;Stair training;Therapeutic Activities;Therapeutic Exercise;UE/LE Strength taining/ROM;UE/LE Coordination activities;Visual/perceptual remediation/compensation PT Transfers Anticipated Outcome(s): mod I PT Locomotion Anticipated Outcome(s): mod I with LRAD PT Recommendation Recommendations for Other Services: Neuropsych consult;Therapeutic Recreation consult Therapeutic Recreation Interventions: Stress management Follow Up Recommendations: Outpatient PT Patient destination: Home Equipment Recommended: Rolling walker with 5" wheels   PT Evaluation Precautions/Restrictions Precautions Precautions: Fall Precaution Comments: right leg weakness, numbness, decreased coordination Restrictions Weight Bearing Restrictions: No Home Living/Prior Functioning Home Living Available Help at Discharge: Family;Available 24 hours/day Type of Home: House Home Access: Stairs to enter [employer] of Steps: 5 Entrance Stairs-Rails: Right;Left;Can reach both Home Layout: One level  Lives With: Spouse Prior Function Level of Independence: Independent with gait;Independent with transfers  Able to Take Stairs?: Yes Driving: Yes Vocation: On disability Comments: Very active. Likes to kayak and bike Vision/Perception  Vision - History Patient Visual Report: Blurring of vision (when turning head to the L) Perception Perception: Within Functional Limits Praxis Praxis: Intact  Cognition Overall Cognitive Status: Within Functional Limits for tasks assessed Arousal/Alertness: Awake/alert Orientation Level: Oriented X4 Attention: Focused;Sustained Focused Attention: Appears intact Sustained Attention: Appears intact Memory: Appears intact Awareness: Appears intact Problem Solving: Appears intact Safety/Judgment:  Appears intact Sensation Sensation Light Touch: Impaired Detail Light Touch Impaired Details: Impaired RLE Proprioception: Appears Intact Coordination Gross Motor Movements are Fluid and Coordinated: No Fine Motor Movements are Fluid and Coordinated: No Coordination and Movement Description: impaired due to R hemi Motor  Motor Motor: Hemiplegia Motor - Skilled Clinical Observations: R hemi  Trunk/Postural Assessment  Cervical Assessment Cervical Assessment: Within Functional Limits Thoracic Assessment Thoracic Assessment: Within Functional Limits Lumbar Assessment Lumbar Assessment: Within Functional Limits Postural Control Postural Control: Deficits on evaluation Trunk Control: decreased in dynamic movement patterns due to weakness and numbness in R trunk  Balance Balance Balance Assessed: Yes Static Sitting Balance Static Sitting - Balance Support: No upper extremity supported;Feet supported Static Sitting - Level of Assistance: 6: Modified independent (Device/Increase time) Dynamic Sitting Balance Dynamic Sitting - Balance Support: No upper extremity supported;Feet supported;During functional activity Dynamic Sitting - Level of Assistance: 5: Stand by assistance Static Standing Balance Static Standing - Balance Support: Bilateral upper extremity supported;During functional activity Static Standing - Level of Assistance: 5: Stand by assistance Dynamic Standing Balance Dynamic Standing - Balance Support: Bilateral upper extremity supported;During functional activity Dynamic Standing - Level of Assistance: 4: Min assist Extremity Assessment   RLE Assessment RLE Assessment: Exceptions to [facility] General Strength Comments: impaired, see below RLE Strength Right Hip Flexion: 3/5 Right Knee Flexion: 3+/5 Right Knee Extension: 3/5 Right Ankle Dorsiflexion: 3/5 LLE Assessment LLE Assessment: Within Functional Limits General Strength Comments: 4+/5 grossly  Care Tool Care  Tool Bed Mobility Roll left and right activity   Roll left and right assist level: Supervision/Verbal cueing    Sit to lying activity   Sit to lying assist level: Moderate Assistance - Patient 50 - 74%    Lying to sitting edge of bed activity   Lying to sitting edge of bed assist level: Minimal Assistance - Patient > 75%     Care Tool Transfers Sit to stand transfer   Sit to stand assist level: Contact Guard/Touching assist    Chair/bed transfer   Chair/bed transfer assist level: Contact Guard/Touching assist  Toilet transfer   Assist Level: Architect transfer assist level: Moderate Assistance - Patient 50 - 74%      Care Tool Locomotion Ambulation   Assist level: Contact Guard/Touching assist Assistive device: Walker-rolling Max distance: 150'  Walk 10 feet activity   Assist level: Contact Guard/Touching assist Assistive device: Walker-rolling   Walk 50 feet with 2 turns activity   Assist level: Contact Guard/Touching assist Assistive device: Walker-rolling  Walk 150 feet activity   Assist level: Contact Guard/Touching assist Assistive device: Walker-rolling  Walk 10 feet on uneven surfaces activity   Assist level: Minimal Assistance - Patient > 75% Assistive device: Walker-rolling  Stairs   Assist level: Minimal Assistance - Patient > 75% Stairs assistive device: 2 hand rails Max number of stairs: 12  Walk up/down 1 step activity   Walk up/down 1 step (curb) assist level: Minimal Assistance - Patient > 75% Walk up/down 1 step or curb assistive device: 2 hand rails    Walk up/down 4 steps activity Walk up/down 4 steps assist level: Minimal Assistance - Patient > 75% Walk up/down 4 steps assistive device: 2 hand rails  Walk up/down 12 steps activity   Walk up/down 12 steps assist level: Minimal Assistance - Patient > 75% Walk up/down 12 steps assistive device: 2 hand rails  Pick up small objects from floor Pick up  small object from the floor (from standing position) activity did not occur: Safety/medical concerns      Wheelchair Will patient use wheelchair at discharge?: No          Wheel 50 feet with 2 turns activity      Wheel 150 feet activity        Refer to Care Plan for Long Term Goals  SHORT TERM GOAL WEEK 1 PT Short Term Goal 1 (Week 1): =LTG due to ELOS  Recommendations for other services: Neuropsych and Therapeutic Recreation  Stress management  Skilled Therapeutic Intervention Evaluation completed (see details above and below) with education on PT POC and goals and individual treatment initiated with focus on functional gait and mobility assessment. Pt received seated in recliner in room, agreeable to PT session. No complaints of pain. Pt is min A for sit to stand to RW progressing to CGA throughout session. Ambulation up to 150 ft with RW and CGA for balance. Pt exhibits decreased gait speed with slow and cautious movements. Ascend/descend 12 x 6" stairs with 2 handrails and min A for balance, step-to gait pattern. Pt exhibits some R hip hiking and decreased knee flexion when navigating stairs. Car transfer with mod A for BLE management in/out of car. Ambulation up/down ramp and across uneven surface with RW and min A for balance. TUG score of 45.6 seconds (average of 49.7, 43.8, and 43.4). Reviewed score and functional implications for fall risk. Pt left seated in recliner in room with needs in reach, chair alarm in place, husband present at end of session.  Mobility Bed Mobility Bed Mobility: Rolling Right;Rolling Left;Supine to Sit;Sit to Supine Rolling Right: Supervision/verbal cueing Rolling Left: Supervision/Verbal cueing Supine to Sit: Minimal Assistance - Patient > 75% Sit to Supine: Moderate Assistance - Patient 50-74% Transfers Transfers: Sit to Stand;Stand to Sit;Stand Pivot Transfers Sit to Stand: Contact Guard/Touching assist Stand to Sit: Contact Guard/Touching  assist Stand Pivot Transfers: Contact Guard/Touching assist Transfer (Assistive device): Rolling walker Locomotion  Gait Gait Distance (Feet): 150 Feet Assistive device: Rolling walker Gait Gait Pattern: Impaired  Gait velocity: decreased Stairs / Additional Locomotion Stairs: Yes Stairs Assistance: Minimal Assistance - Patient > 75% Stair Management Technique: Two rails;Step to pattern Number of Stairs: 12 Height of Stairs: 6 Ramp: Minimal Assistance - Patient >75% Wheelchair Mobility Wheelchair Mobility: No   Discharge Criteria: Patient will be discharged from PT if patient refuses treatment 3 consecutive times without medical reason, if treatment goals not met, if there is a change in medical status, if patient makes no progress towards goals or if patient is discharged from hospital.  The above assessment, treatment plan, treatment alternatives and goals were discussed and mutually agreed upon: by patient   Peter Congo, PT, DPT 07/20/2020, 5:30 PM

## 2020-07-20 NOTE — Evaluation (Signed)
Occupational Therapy Assessment and Plan  Patient Details  Name: [name] [name] MRN: [id number] Date of Birth: 1961-08-10  OT Diagnosis: acute pain, ataxia, disturbance of vision and hemiplegia affecting dominant side Rehab Potential: Rehab Potential (ACUTE ONLY): Excellent ELOS: 8-10 days   Today's Date: 07/20/2020 OT Individual Time: 2536-6440 OT Individual Time Calculation (min): 75 min     Hospital Problem: Principal Problem:   Thalamic stroke [name])   Past Medical History:  Past Medical History:  Diagnosis Date  . Chickenpox   . Complication of anesthesia    anxiety and panic  . Diabetes mellitus without complication (HCC)    diet controlled, no longer needs meds  . Elevated liver enzymes    ABD US done 08/2016: fatty liver  . Fatty liver    ABD US done 08/2016: fatty liver  . Hyperlipidemia    no longer needs meds  . Hypertension    pt states no longer needs meds  . Hypothyroidism   . OA (osteoarthritis) of hip    right  . Schizophrenia (HCC)   . Thyroid disease    no meds   Past Surgical History:  Past Surgical History:  Procedure Laterality Date  . BREAST BIOPSY     needle bx more than 15 yrs benign, no scar visible   . LACERATION REPAIR N/A 12/13/2017   Procedure: Closure of nasal lacerations with one rotator graft and one advanced flap with skin graft, closure of left nasal laceration, closure of left ear.;  Surgeon: Suzanna Obey, MD;  Location: [facility];  Service: ENT;  Laterality: N/A;    Assessment & Plan Clinical Impression: [name] [name] is a 59 year old female with history of T2DM-diet controlled,  HTN, fatty liver, Schizoaffective d/o who was admitted on 07/12/20 with HA, right sided weakness and numbness as well as difficulty speaking. UDS positive for THC. 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  MRI brain showed small acute/early subacute left  thalamic infarct. She was out of window for tPA.    2D echo showed EF 70-75% with grade II DD and degenerative MV. Dr. Pearlean Brownie felt that stroke due to small vessel disease and recommended  DAPT X 3 weeks followed by ASA alone.  She was placed in sleep smart study which was positive for OSA and CPAP ordered for use. Therapy ongoing and patient continues to be limited by left sided weakness as well as deficits with comprehension and processing. CIR recommended due to functional deficits.    Patient transferred to CIR on 07/19/2020 .    Patient currently requires CGA -  supervision with basic self-care skills secondary to muscle weakness, decreased cardiorespiratoy endurance, impaired timing and sequencing, unbalanced muscle activation and decreased coordination, decreased visual acuity and decreased visual motor skills and decreased standing balance, decreased postural control, hemiplegia and decreased balance strategies.  Prior to hospitalization, patient was fully independent.   Patient will benefit from skilled intervention to increase independence with basic self-care skills prior to discharge home independently.  Anticipate patient will be mod I  and follow up outpatient.  OT - End of Session Activity Tolerance: Tolerates 10 - 20 min activity with multiple rests Endurance Deficit: Yes OT Assessment Rehab Potential (ACUTE ONLY): Excellent OT Barriers to Discharge: Decreased caregiver support OT Barriers to Discharge Comments: husband is legally blind OT Patient demonstrates impairments in the following area(s): Balance;Endurance;Motor;Sensory OT Basic ADL's Functional  Problem(s): Bathing;Dressing;Toileting;Grooming OT Advanced ADL's Functional Problem(s): Simple Meal Preparation;Light Housekeeping OT Transfers Functional Problem(s): Toilet;Tub/Shower OT Additional Impairment(s): Fuctional Use of Upper Extremity OT Plan OT Intensity: Minimum of 1-2 x/day, 45 to 90 minutes OT Frequency: 5 out of 7  days OT Duration/Estimated Length of Stay: 8-10 days OT Treatment/Interventions: Balance/vestibular training;Discharge planning;Functional mobility training;DME/adaptive equipment instruction;Neuromuscular re-education;Pain management;Patient/family education;Psychosocial support;Self Care/advanced ADL retraining;Therapeutic Activities;Therapeutic Exercise;UE/LE Strength taining/ROM;UE/LE Coordination activities OT Self Feeding Anticipated Outcome(s): independent OT Basic Self-Care Anticipated Outcome(s): Mod I OT Toileting Anticipated Outcome(s): Mod I OT Bathroom Transfers Anticipated Outcome(s): Mod I OT Recommendation Recommendations for Other Services: Therapeutic Recreation consult Therapeutic Recreation Interventions: Kitchen group;Outing/community reintergration Patient destination: Home Follow Up Recommendations: Outpatient OT Equipment Recommended: To be determined Equipment Details: possibly a shower seat   OT Evaluation Precautions/Restrictions  Precautions Precautions: Fall Precaution Comments: right leg weakness, numbness, decreased coordination  Pain Pain Assessment Pain Score: 4  Pain Type: Acute pain Pain Location: Neck Pain Orientation: Right Pain Descriptors / Indicators: Aching Pain Onset: On-going Pain Intervention(s): Heat applied Home Living/Prior Functioning Home Living Family/patient expects to be discharged to:: Private residence Living Arrangements: Spouse/significant other Available Help at Discharge: Family, Available 24 hours/day Type of Home: House Home Access: Stairs to enter [employer] of Steps: 5 Entrance Stairs-Rails: Right, Left, Can reach both Home Layout: One level Bathroom Shower/Tub: Tub only, Health visitor: Standard  Lives With: Spouse Prior Function Vocation: On disability Comments: Very active. Likes to kayak and bike Vision Baseline Vision/History: Wears glasses Wears Glasses: Reading  only Patient Visual Report: Blurring of vision Vision Assessment?: Vision impaired- to be further tested in functional context Additional Comments: See OT note on 07/20/20 Perception  Perception: Within Functional Limits Praxis Praxis: Intact Cognition Overall Cognitive Status: Within Functional Limits for tasks assessed Arousal/Alertness: Awake/alert Orientation Level: Person;Place;Situation Person: Oriented Place: Oriented Situation: Oriented Year: 2021 Month: September Day of Week: Correct Memory: Appears intact Immediate Memory Recall: Sock;Blue;Bed Memory Recall Sock: Without Cue Memory Recall Blue: Without Cue Memory Recall Bed: Without Cue Awareness: Appears intact Problem Solving: Appears intact Safety/Judgment: Appears intact Sensation Sensation Light Touch: Impaired by gross assessment Hot/Cold: Appears Intact Proprioception: Appears Intact Stereognosis: Appears Intact Additional Comments: describes pins and needles throughout entire R arm and leg - can feel light touch Coordination Gross Motor Movements are Fluid and Coordinated: No Fine Motor Movements are Fluid and Coordinated: No Coordination and Movement Description: slow and labored on R; difficulty with handwriting Finger Nose Finger Test: slow and labored on R Motor  Motor Motor: Hemiplegia  Trunk/Postural Assessment  Cervical Assessment Cervical Assessment: Within Functional Limits Thoracic Assessment Thoracic Assessment: Within Functional Limits Lumbar Assessment Lumbar Assessment: Within Functional Limits Postural Control Postural Control: Deficits on evaluation Trunk Control: decreased in dynamic movement patterns due to weakness and numbness in R trunk  Balance Dynamic Sitting Balance Dynamic Sitting - Level of Assistance: 5: Stand by assistance Static Standing Balance Static Standing - Level of Assistance: 5: Stand by assistance Dynamic Standing Balance Dynamic Standing - Level of  Assistance: 4: Min assist Extremity/Trunk Assessment RUE Assessment Passive Range of Motion (PROM) Comments: WFL Active Range of Motion (AROM) Comments: WFL General Strength Comments: sh flex and abd 3+/5; elbow flex and ext 4-/5, grasp 4/5 LUE Assessment LUE Assessment: Within Functional Limits  Care Tool Care Tool Self Care Eating   Eating Assist Level: Set up assist    Oral Care    Oral Care Assist Level: Supervision/Verbal cueing    Bathing  Body parts bathed by patient: Right arm;Left arm;Chest;Abdomen;Front perineal area;Buttocks;Left lower leg;Face;Right lower leg;Left upper leg;Right upper leg     Assist Level: Supervision/Verbal cueing    Upper Body Dressing(including orthotics)   What is the patient wearing?: Bra;Pull over shirt   Assist Level: Set up assist    Lower Body Dressing (excluding footwear)   What is the patient wearing?: Underwear/pull up;Pants Assist for lower body dressing: Supervision/Verbal cueing    Putting on/Taking off footwear   What is the patient wearing?: Socks;Shoes Assist for footwear: Supervision/Verbal cueing       Care Tool Toileting Toileting activity   Assist for toileting: Supervision/Verbal cueing     Care Tool Bed Mobility Roll left and right activity   Roll left and right assist level: Supervision/Verbal cueing    Sit to lying activity        Lying to sitting edge of bed activity         Care Tool Transfers Sit to stand transfer   Sit to stand assist level: Supervision/Verbal cueing    Chair/bed transfer   Chair/bed transfer assist level: Contact Guard/Touching assist     Toilet transfer   Assist Level: Contact Guard/Touching assist     Care Tool Cognition Expression of Ideas and Wants Expression of Ideas and Wants: Without difficulty (complex and basic) - expresses complex messages without difficulty and with speech that is clear and easy to understand   Understanding Verbal and Non-Verbal Content  Understanding Verbal and Non-Verbal Content: Understands (complex and basic) - clear comprehension without cues or repetitions   Memory/Recall Ability *first 3 days only Memory/Recall Ability *first 3 days only: That he or she is in a hospital/hospital unit;Current season;Location of own room;Staff names and faces    Refer to Care Plan for Long Term Goals  SHORT TERM GOAL WEEK 1 OT Short Term Goal 1 (Week 1): STGs = LTGs  Recommendations for other services: Adult nurse group   Skilled Therapeutic Intervention ADL ADL Eating: Set up Grooming: Supervision/safety Where Assessed-Grooming: Standing at sink Upper Body Bathing: Supervision/safety Where Assessed-Upper Body Bathing: Shower Lower Body Bathing: Supervision/safety Where Assessed-Lower Body Bathing: Shower Upper Body Dressing: Setup Where Assessed-Upper Body Dressing: Edge of bed Lower Body Dressing: Supervision/safety Where Assessed-Lower Body Dressing: Edge of bed Toileting: Supervision/safety Where Assessed-Toileting: Teacher, adult education: Furniture conservator/restorer Method: Biomedical scientist Method: Designer, industrial/product: Transfer tub bench;Grab bars Mobility  Transfers Sit to Stand: Supervision/Verbal cueing Stand to Sit: Supervision/Verbal cueing   Pt seen for initial evaluation and BADL retraining of toileting, bathing, and dressing with a focus on balance, R side coordination, dynamic reach, endurance.  Explained role of OT and pt discussed her goals of being extremely independent as her spouse will not be able to A her.  Began session with dynamic balance activities of sit >< stand, standing and reaching overhead and twisting side to side.  She is able to move through motor patterns but is slow and movement patterns are labored due to incoordination and weakness on R side.   Pt felt more confident with a RW at this time,  used RW to ambulate to BR to complete toileting, shower then returned to bed to dress. See ADL documentation.    Pt is extremely motivated and would like to return to a high level of function.  Pt resting in recliner with all needs met.  Chair alarm set.    Discharge Criteria: Patient  will be discharged from OT if patient refuses treatment 3 consecutive times without medical reason, if treatment goals not met, if there is a change in medical status, if patient makes no progress towards goals or if patient is discharged from hospital.  The above assessment, treatment plan, treatment alternatives and goals were discussed and mutually agreed upon: by patient  [facility] 07/20/2020, 12:17 PM

## 2020-07-20 NOTE — Plan of Care (Signed)
Nutrition Education Note  RD consulted for nutrition education regarding a Heart Healthy, Consistent Carbohydrate diet.  Spoke with pt at bedside. Pt reports good appetite and eating well at meals. 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 Pt reports that she was having to grab food that was quick and easy rather than plan out her meals and count her carbohydrates. Pt has been practicing counting her carbohydrates during this admission. Pt very motivated to make changes to bring her hemoglobin A1C down. She is also motivated to make dietary changes to bring her cholesterol down. Pt reports that she enjoys using coconut oil when cooking and baking. RD recommended using unsaturated oil like olive oil or avocado oil.  Lipid Panel     Component Value Date/Time   CHOL 274 (H) 07/13/2020 0519   TRIG 232 (H) 07/13/2020 0519   HDL 37 (L) 07/13/2020 0519   CHOLHDL 7.4 07/13/2020 0519   VLDL 46 (H) 07/13/2020 0519   LDLCALC 191 (H) 07/13/2020 0519    Lab Results  Component Value Date   HGBA1C 11.6 (H) 07/14/2020    RD provided "Heart Healthy, Consistent Carbohydrate Nutrition Therapy" handout from the Academy of [employer] and Dietetics. Discussed different food groups and their effects on blood sugar, emphasizing carbohydrate-containing foods. Provided list of carbohydrates and recommended serving sizes of common foods.  Discussed importance of controlled and consistent carbohydrate intake throughout the day. Provided examples of ways to balance meals/snacks and encouraged intake of high-fiber, whole grain complex carbohydrates. Teach back method used.  Provided examples on ways to decrease sodium and fat intake in diet. Discouraged intake of processed foods and use of salt shaker. Encouraged fresh fruits and vegetables as well as whole grain sources of carbohydrates to maximize fiber intake. Teach back  method used.  Expect good compliance.  Current diet order is Carb Modified, patient is consuming approximately 100% of meals at this time. Labs and medications reviewed. No further nutrition interventions warranted at this time. RD contact information provided. If additional nutrition issues arise, please re-consult RD.   Gaynell Face, MS, RD, LDN Inpatient Clinical Dietitian Please see [facility] for contact information.

## 2020-07-20 NOTE — Progress Notes (Signed)
Due to pts diet and allergies menu options are limited, pt is  requesting double protein in meals for lunch and dinner. Pt was in contact with dietary and was advised to have nurse place note in chart. No other concerns to report.

## 2020-07-20 NOTE — Progress Notes (Signed)
Occupational Therapy Session Note  Patient Details  Name: [name] [name] MRN: [id number] Date of Birth: 24-Feb-1961  Today's Date: 07/20/2020 OT Individual Time: 1115-1200 OT Individual Time Calculation (min): 45 min    Short Term Goals: Week 1:  OT Short Term Goal 1 (Week 1): STGs = LTGs  Skilled Therapeutic Interventions/Progress Updates:  1:1. Pt received in recliner agreeable to OT with mild pain in R shoulder. Pt completes ambulation recliner<>w/c with S overall and VC for scooting to EOC prior to STS d/t pushing knees into back of chair. Pt completes seated 9HPT RUE 1 min 10 sec LUE 38 sec. Pt completes standing dynavision 1 min each UE RUE: 2.5 avg reaction time LUE 2.0. Pt reporting blurring of vision in L visual field when looking at stimuli. Pt requires extra eye shifts when tracking past midline to L horizontally and diagonally. Pt also demo impaired convergence. Exited session with pt seated in recliner, exit alarm on and call light inr each   Therapy Documentation Precautions:  Precautions Precautions: Fall Precaution Comments: right leg weakness, numbness, decreased coordination Restrictions Weight Bearing Restrictions: No General:   Vital Signs:  Pain: Pain Assessment Pain Score: 4  Pain Type: Acute pain Pain Location: Neck Pain Orientation: Right Pain Descriptors / Indicators: Aching Pain Onset: On-going Pain Intervention(s): Heat applied ADL: ADL Eating: Set up Grooming: Supervision/safety Where Assessed-Grooming: Standing at sink Upper Body Bathing: Supervision/safety Where Assessed-Upper Body Bathing: Shower Lower Body Bathing: Supervision/safety Where Assessed-Lower Body Bathing: Shower Upper Body Dressing: Setup Where Assessed-Upper Body Dressing: Edge of bed Lower Body Dressing: Supervision/safety Where Assessed-Lower Body Dressing: Edge of bed Toileting: Supervision/safety Where Assessed-Toileting: Glass blower/designer: Medical laboratory scientific officer Method: Product/process development scientist Method: Heritage manager: Radio broadcast assistant, Grab bars Vision Baseline Vision/History: Wears glasses Wears Glasses: Reading only Patient Visual Report: Blurring of vision Vision Assessment?: Vision impaired- to be further tested in functional context Additional Comments: See OT note on 07/20/20 Perception  Perception: Within Functional Limits Praxis Praxis: Intact Exercises:   Other Treatments:     Therapy/Group: Individual Therapy  Tonny Branch 07/20/2020, 12:20 PM

## 2020-07-20 NOTE — Progress Notes (Signed)
Inpatient Rehabilitation  Patient information reviewed and entered into eRehab system by Caliana Spires M. Bastian Andreoli, M.A., CCC/SLP, PPS Coordinator.  Information including medical coding, functional ability and quality indicators will be reviewed and updated through discharge.    

## 2020-07-20 NOTE — Progress Notes (Addendum)
[facility] PHYSICAL MEDICINE & REHABILITATION PROGRESS NOTE   Subjective/Complaints: Complains of some right sided neck pain.  No other complaints. Was very highly functional prior to admission- kayaking, hiking.  Working well with PT this morning.   ROS: +pain. Denies constipation.  Objective:   No results found. No results for input(s): WBC, HGB, HCT, PLT in the last 72 hours. No results for input(s): NA, K, CL, CO2, GLUCOSE, BUN, CREATININE, CALCIUM in the last 72 hours.  Intake/Output Summary (Last 24 hours) at 07/20/2020 0931 Last data filed at 07/19/2020 2130 Gross per 24 hour  Intake 540 ml  Output --  Net 540 ml     Physical Exam: Vital Signs Blood pressure 120/72, pulse 74, temperature (!) 97.5 F (36.4 C), resp. rate 18, height 5\' 6"  (1.676 m), weight 99.4 kg, SpO2 99 %. General: Alert and oriented x 3, No apparent distress HEENT: Head is normocephalic, atraumatic, PERRLA, EOMI, sclera anicteric, oral mucosa pink and moist, dentition intact, ext ear canals clear,  Neck: Supple without JVD or lymphadenopathy Heart: Reg rate and rhythm. No murmurs rubs or gallops Chest: CTA bilaterally without wheezes, rales, or rhonchi; no distress Abdomen: Soft, non-tender, non-distended, bowel sounds positive. Extremities: No clubbing, cyanosis, or edema. Pulses are 2+ Skin: Clean and intact without signs of breakdown Neurological:  Mental Status: She is alertand oriented to person, place, and time.  Comments: Speech clear. Able to follow command without difficulty. RUE 4/5 prox to distal with mild PD. RLE 3/5 prox to distal. Sensory loss along right face, arm and leg. LUE/LLE 5/5. good insight and awareness. Functional memory.  Tender to palpation right side of neck and upper back muscles Psychiatric:  Mood and Affect: Moodnormal.  Behavior: Behaviornormal.   Assessment/Plan: 1. Functional deficits secondary to thalamic stroke which require 3+ hours per day  of interdisciplinary therapy in a comprehensive inpatient rehab setting.  Physiatrist is providing close team supervision and 24 hour management of active medical problems listed below.  Physiatrist and rehab team continue to assess barriers to discharge/monitor patient progress toward functional and medical goals  Care Tool:  Bathing              Bathing assist       Upper Body Dressing/Undressing Upper body dressing        Upper body assist      Lower Body Dressing/Undressing Lower body dressing            Lower body assist       Toileting Toileting    Toileting assist       Transfers Chair/bed transfer  Transfers assist     Chair/bed transfer assist level: Minimal Assistance - Patient > 75%     Locomotion Ambulation   Ambulation assist              Walk 10 feet activity   Assist           Walk 50 feet activity   Assist           Walk 150 feet activity   Assist           Walk 10 feet on uneven surface  activity   Assist           Wheelchair     Assist               Wheelchair 50 feet with 2 turns activity    Assist  Wheelchair 150 feet activity     Assist          Blood pressure 120/72, pulse 74, temperature (!) 97.5 F (36.4 C), resp. rate 18, height 5\' 6"  (1.676 m), weight 99.4 kg, SpO2 99 %.  Medical Problem List and Plan: 1.Right hemiparesis and functional deficitssecondary to left thalamic infarct -patient may shower -ELOS/Goals: mod I, 7-10 days  -Initial CIR evals today.  2. Antithrombotics: -DVT/anticoagulation:Pharmaceutical:Lovenox -antiplatelet therapy: DAPT X 3 weeks followed by ASA alone 3. Pain Management:heating pad for right cervical myofascial pain. Discussed neck positioning to prevent worsening while sleeping 4. Mood:LCSW to follow for evaluation and support. -antipsychotic agents:  N/A 5. Neuropsych: This patientiscapable of making decisions on herown behalf. 6. Skin/Wound Care:Routine pressure relief measures. 7. Fluids/Electrolytes/Nutrition:Monitor I/O. CMP has not yet resulted 8. T2DM: Hgb a1C- 11.6 and poorly controlled. Has not been on any meds for years.Continue Lantus insulin--willing to go home on this.Monitor BS ac/hs and continue to use SSI for tighter BS control as indicated. Consult dietician for dietary education. Pt wants to improve her control and diabetes awareness.  Elevated to 236 last night and 125 this morning. Increase Lantus to 21 U on 9/8.  9. HTN: Monitor BP tid-denies history of hypertension.Well controlled.  10 Schizophrenia/Bipolar d/o/PTSD:Reports that it has been managed with psychotherapy. 11. OSA: Continue CPAP 12. Erythrocytosis:13.8-->16.0 acute rise during admission and question accuracy. Will monitor for trend.  13. Dyslipidemia: On Lipitor    LOS: 1 days A FACE TO FACE EVALUATION WAS PERFORMED  Clide Deutscher Maliha Outten 07/20/2020, 9:31 AM

## 2020-07-21 ENCOUNTER — Inpatient Hospital Stay (HOSPITAL_COMMUNITY): Payer: Medicare PPO

## 2020-07-21 ENCOUNTER — Inpatient Hospital Stay (HOSPITAL_COMMUNITY): Payer: Medicare PPO | Admitting: Occupational Therapy

## 2020-07-21 ENCOUNTER — Ambulatory Visit: Payer: Medicare PPO

## 2020-07-21 LAB — GLUCOSE, CAPILLARY
Glucose-Capillary: 121 mg/dL — ABNORMAL HIGH (ref 70–99)
Glucose-Capillary: 112 mg/dL — ABNORMAL HIGH (ref 70–99)
Glucose-Capillary: 127 mg/dL — ABNORMAL HIGH (ref 70–99)
Glucose-Capillary: 144 mg/dL — ABNORMAL HIGH (ref 70–99)

## 2020-07-21 MED ORDER — LINAGLIPTIN 5 MG PO TABS: 5.0000 mg | ORAL_TABLET | Freq: Every day | ORAL | Status: DC

## 2020-07-21 MED ORDER — LINAGLIPTIN 5 MG PO TABS
5.0000 mg | ORAL_TABLET | Freq: Every day | ORAL | Status: DC
  Administered 2020-07-22 – 2020-07-28 (×7): 5 mg via ORAL
  Filled 2020-07-21 (×7): qty 1

## 2020-07-21 NOTE — Progress Notes (Signed)
Occupational Therapy Session Note  Patient Details  Name: [name] [name] MRN: [id number] Date of Birth: 03/28/1961  Today's Date: 07/21/2020 OT Individual Time: 1100-1200 OT Individual Time Calculation (min): 60 min    Short Term Goals: Week 1:  OT Short Term Goal 1 (Week 1): STGs = LTGs  Skilled Therapeutic Interventions/Progress Updates:    Pt received in recliner ready for therapy. Pt used RW to ambulate to gym.    Focused on dynamic balance, R shoulder strength, coordination, RLE strength to increase safety and independence with mobility: - seated overhead reaching with dowel bar - 1 lb progressing to 3 lb -seated rows forward and back and kayaking side to side with dowels -sit to stand holding dowel reaching bar overhead on each stand -standing a parallel bars worked on B heel raises, R heel raise, R single leg squat, alternating overhead reaches to stretch torso  Pt participated extremely well.  Ambulated back to room with RW with CGA. Pt resting in recliner with all needs met and chair alarm set.   Therapy Documentation Precautions:  Precautions Precautions: Fall Precaution Comments: right leg weakness, numbness, decreased coordination Restrictions Weight Bearing Restrictions: No    Vital Signs: Therapy Vitals Temp: 97.8 F (36.6 C) Pulse Rate: 66 Resp: 18 BP: (!) 126/59 Patient Position (if appropriate): Lying Oxygen Therapy SpO2: 99 % O2 Device: Room Air Pain: Pain Assessment Pain Score: 4  Pain Location: Scapula Pain Orientation: Left Pain Descriptors / Indicators: Aching Pain Onset: On-going Patients Stated Pain Goal: 0 Pain Intervention(s): Medication (See eMAR);RN made aware;Repositioned;Distraction Multiple Pain Sites: No ADL: ADL Eating: Set up Grooming: Supervision/safety Where Assessed-Grooming: Standing at sink Upper Body Bathing: Supervision/safety Where Assessed-Upper Body Bathing: Shower Lower Body Bathing: Supervision/safety Where  Assessed-Lower Body Bathing: Shower Upper Body Dressing: Setup Where Assessed-Upper Body Dressing: Edge of bed Lower Body Dressing: Supervision/safety Where Assessed-Lower Body Dressing: Edge of bed Toileting: Supervision/safety Where Assessed-Toileting: Glass blower/designer: Therapist, music Method: Magazine features editor: Curator Method: Heritage manager: Radio broadcast assistant, Grab bars   Therapy/Group: Individual Therapy  [place] 07/21/2020, 9:09 AM

## 2020-07-21 NOTE — Progress Notes (Signed)
Patient Details  Name: [name] [name] MRN: [id number] Date of Birth: 11-May-1961  Today's Date: 07/21/2020  Hospital Problems: Principal Problem:   Thalamic stroke [facility])  Past Medical History:  Past Medical History:  Diagnosis Date  . Chickenpox   . Complication of anesthesia    anxiety and panic  . Diabetes mellitus without complication (HCC)    diet controlled, no longer needs meds  . Elevated liver enzymes    ABD US done 08/2016: fatty liver  . Fatty liver    ABD US done 08/2016: fatty liver  . Hyperlipidemia    no longer needs meds  . Hypertension    pt states no longer needs meds  . Hypothyroidism   . OA (osteoarthritis) of hip    right  . Schizophrenia (HCC)   . Thyroid disease    no meds   Past Surgical History:  Past Surgical History:  Procedure Laterality Date  . BREAST BIOPSY     needle bx more than 15 yrs benign, no scar visible   . LACERATION REPAIR N/[name] 12/13/2017   Procedure: Closure of nasal lacerations with one rotator graft and one advanced flap with skin graft, closure of left nasal laceration, closure of left ear.;  Surgeon: Suzanna Obey, MD;  Location: [facility];  Service: ENT;  Laterality: N/[name];   Social History:  reports that she quit smoking about 17 years ago. She has never used smokeless tobacco. She reports current alcohol use of about 3.0 standard drinks of alcohol per week. She reports that she does not use drugs.  Family / Support Systems Marital Status: Married How Long?: 25 years Patient Roles: Spouse Spouse/Significant Other: Bruce [phone]) Children: 1 adult dtr that lives in [place] two step children. Other Supports: [name] friend to help assist with transportation at times. Anticipated Caregiver: Self Ability/Limitations of Caregiver: Pt is own caregiver; husband is only supervision level of care as he is legally blind and not physically able to assist. Caregiver Availability: 24/7 Family Dynamics: Pt and husband live  alone.  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 Education: come collete Read: Yes Write: Yes Employment Status: Disabled Date Retired/Disabled/Unemployed: 2013 Marine scientist Issues: Denies Guardian/Conservator: n/[name]   Abuse/Neglect Abuse/Neglect Assessment Can Be Completed: Yes Physical Abuse: Denies Verbal Abuse: Denies Sexual Abuse: Denies Exploitation of patient/patient's resources: Denies Self-Neglect: Denies  Emotional Status Pt's affect, behavior and adjustment status: Pt in good spirtis at time of visit Recent Psychosocial Issues: Pt admits to diagnosis of schizoaffective disorder Psychiatric History: Pt has current psych care in place-[facility] for medication management Misty Stanley, PA) and counseling Debarah Crape)- has been with for almost 1 yr. Substance Abuse History: Denies; quit cigarettes 30 years ago; etoh -socially; rec- 10 to 12 THC gummies per night to aide with sleep  Patient / Family Perceptions, Expectations & Goals Pt/Family understanding of illness & functional limitations: Pt has general understanding of car eneeds Premorbid pt/family roles/activities: Independent Anticipated changes in roles/activities/participation: Some assistance IADLs such as transportation  [employer]: None Premorbid Home Care/DME Agencies: None Transportation available at discharge: friend Resource referrals recommended: Neuropsychology  Discharge Planning Living Arrangements: Spouse/significant other Support Systems: Spouse/significant other, Friends/neighbors Type of Residence: Private residence Civil engineer, contracting: Media planner (specify) ([employer] Medicare) Financial Resources: SSD, Family Support Financial Screen Referred: No Living  Expenses: Own Money Management: Patient Does the patient have any problems obtaining your medications?: No Home Management: Pt performed all meal preppoing and housekeeping Patient/Family Preliminary Plans: No changes Care Coordinator Barriers to Discharge: Decreased caregiver support, Lack of/limited family support Care Coordinator Anticipated Follow Up Needs: HH/OP Expected length of stay: 7-10 days  Clinical Impression Sw met with pt in room to introduce self, explain role, and discuss discharge process. Pt will be her own caregiver. Pt has no HCPOA. Not [name] veteran. Pt has no DME. Pt reports recent losses of brother May 10 and mother July 28- both this year. Pt admits she is adjusting. She reports that her husband does not drive.   [name] [name] [name] [name] 07/21/2020, 5:54 PM

## 2020-07-21 NOTE — Progress Notes (Signed)
Physical Therapy Session Note  Patient Details  Name: [name] [name] MRN: [id number] Date of Birth: 05/14/1961  Today's Date: 07/21/2020 PT Individual Time: 1400-1500 PT Individual Time Calculation (min): 60 min   Short Term Goals: Week 1:  PT Short Term Goal 1 (Week 1): =LTG due to ELOS Week 2:    Week 3:     Skilled Therapeutic Interventions/Progress Updates:    PAIN dull ache R hip w/activity, rest as needed.  Pt initially oob in recliner and agreeable to session. Gait >160ft w/RW and cga to close supervision. Gait trials without AD: 174ft x 2 w/cga to min assist, decreased cadence, perceived instability, occasional mild decreased clearance RLE, pt stops and "resets", no balance loss, absent bilat armswing, holds in high guarding position. slighltly decreased stance time RvsL.  Balance/NMRE: Static stand no UE support - mild sway Static stand eyes closed 10 count x 4 w/mild increase in sway but no balance loss  Standing on foam: Increased sway, cga for safety Standing on foam eyes closed 10 count, mild increased sqay, cga Standing on foam cervical nods and turns increased gradully rate, cga increased sway but no balance loss  Standing tandem R/L and L/R uses walker only to assume position Alternating arm raises to bilat arm raises cga, increased sqay w/L/R Added cervical nods and turns, cga  Gait 168ft no AD, deviations as above but slightly improved clearance and cadence.  Transfers to recliner w/cga. Pt left oob in recliner w/chair alarm set and needs in reach. Pt educated throughout session of purpose of balance challenges and gait w/o AD for motor learning.   Therapy Documentation Precautions:  Precautions Precautions: Fall Precaution Comments: right leg weakness, numbness, decreased coordination Restrictions Weight Bearing Restrictions: No    Therapy/Group: Individual Therapy  Callie Fielding, [place] 07/21/2020, 3:13 PM

## 2020-07-21 NOTE — Care Management (Addendum)
Inpatient [place] Individual Statement of Services  Patient Name:  [name] [name]  Date:  07/21/2020  Welcome to the [facility].  Our goal is to provide you with an individualized program based on your diagnosis and situation, designed to meet your specific needs.  With this comprehensive rehabilitation program, you will be expected to participate in at least 3 hours of rehabilitation therapies Monday-Friday, with modified therapy programming on the weekends.  Your rehabilitation program will include the following services:  Physical Therapy (PT), Occupational Therapy (OT), Speech Therapy (ST), 24 hour per day rehabilitation nursing, Therapeutic Recreaction (TR), Psychology, Neuropsychology, Care Coordinator, Rehabilitation Medicine, Nutrition Services, Pharmacy Services and Other  Weekly team conferences will be held on Tuesdays to discuss your progress.  Your Inpatient Rehabilitation Care Coordinator will talk with you frequently to get your input and to update you on team discussions.  Team conferences with you and your family in attendance may also be held.  Expected length of stay: 7-10 days    Overall anticipated outcome: Independent with an Assistive Device  Depending on your progress and recovery, your program may change. Your Inpatient Rehabilitation Care Coordinator will coordinate services and will keep you informed of any changes. Your Inpatient Rehabilitation Care Coordinator's name and contact numbers are listed  below.  The following services may also be recommended but are not provided by the [facility]:    [place] will be made to provide these services after discharge if needed.  Arrangements include referral to agencies that provide these services.  Your insurance has been verified to be:  [employer]  Your primary doctor is:   Horald Pollen  Pertinent information will be shared with your doctor and your insurance company.  Inpatient Rehabilitation Care Coordinator:  Cathleen Corti [phone] or ([phone]  Information discussed with and copy given to patient by: Rana Snare, 07/21/2020, 5:56 PM

## 2020-07-21 NOTE — IPOC Note (Signed)
Overall Plan of Care [facility]) Patient Details Name: [name] [name] MRN: [id number] DOB: 03-27-1961  Admitting Diagnosis: Thalamic stroke [facility])  Hospital Problems: Principal Problem:   Thalamic stroke (HCC)     Functional Problem List: Nursing Endurance, Medication Management, Motor, Safety  PT Balance, Endurance, Motor, Safety, Sensory  OT Balance, Endurance, Motor, Sensory  SLP    TR         Basic ADL's: OT Bathing, Dressing, Toileting, Grooming     Advanced  ADL's: OT Simple Meal Preparation, Light Housekeeping     Transfers: PT Bed Mobility, Bed to Chair, Car, [employer], Civil Service fast streamer, Research scientist (life sciences): PT Ambulation, Psychologist, prison and probation services, Stairs     Additional Impairments: OT Fuctional Use of Upper Extremity  SLP        TR      Anticipated Outcomes Item Anticipated Outcome  Self Feeding independent  Swallowing      Basic self-care  Mod I  Toileting  Mod I   Bathroom Transfers Mod I  Bowel/Bladder  continent of B/B   Transfers  mod I  Locomotion  mod I with LRAD  Communication     Cognition     Pain  free of pain  Safety/Judgment  Will adhere to safety protocals independently   Therapy Plan: PT Intensity: Minimum of 1-2 x/day ,45 to 90 minutes PT Frequency: 5 out of 7 days PT Duration Estimated Length of Stay: 7-10 days OT Intensity: Minimum of 1-2 x/day, 45 to 90 minutes OT Frequency: 5 out of 7 days OT Duration/Estimated Length of Stay: 8-10 days     Due to the current state of emergency, patients may not be receiving their 3-hours of Medicare-mandated therapy.   Team Interventions: Nursing Interventions Patient/Family Education, Disease Management/Prevention, Medication Management, Discharge Planning, Psychosocial Support  PT interventions Ambulation/gait training, Warden/ranger, Community reintegration, Discharge planning, Disease management/prevention, DME/adaptive equipment instruction, Functional  mobility training, Neuromuscular re-education, Pain management, Patient/family education, Psychosocial support, Stair training, Therapeutic Activities, Therapeutic Exercise, UE/LE Strength taining/ROM, UE/LE Coordination activities, Visual/perceptual remediation/compensation  OT Interventions Balance/vestibular training, Discharge planning, Functional mobility training, DME/adaptive equipment instruction, Neuromuscular re-education, Pain management, Patient/family education, Psychosocial support, Self Care/advanced ADL retraining, Therapeutic Activities, Therapeutic Exercise, UE/LE Strength taining/ROM, UE/LE Coordination activities  SLP Interventions    TR Interventions    SW/CM Interventions Discharge Planning, Psychosocial Support, Patient/Family Education   Barriers to Discharge MD  Medical stability  Nursing      PT      OT Decreased caregiver support husband is legally blind  SLP      SW       Team Discharge Planning: Destination: PT-Home ,OT- Home , SLP-  Projected Follow-up: PT-Outpatient PT, OT-  Outpatient OT, SLP-  Projected Equipment Needs: PT-Rolling walker with 5" wheels, OT- To be determined, SLP-  Equipment Details: PT- , OT-possibly a shower seat Patient/family involved in discharge planning: PT- Patient,  OT-Patient, SLP-   MD ELOS: 7-10 days Medical Rehab Prognosis:  Excellent Assessment: The patient has been admitted for CIR therapies with the diagnosis of thalamic infarct with right hemiparesis and sensory changes. The team will be addressing functional mobility, strength, stamina, balance, safety, adaptive techniques and equipment, self-care, bowel and bladder mgt, patient and caregiver education, NMR, community reintegration. Goals have been set at mod I.   Due to the current state of emergency, patients may not be receiving their 3 hours per day of Medicare-mandated therapy.    Ranelle Oyster, MD, [place]  See Team Conference Notes for weekly updates  to the plan of care

## 2020-07-21 NOTE — Progress Notes (Signed)
Physical Therapy Session Note  Patient Details  Name: [name] [name] MRN: [id number] Date of Birth: [birth year]  Today's Date: 07/21/2020 PT Individual Time: 0900-0957 PT Individual Time Calculation (min): 57 min   Short Term Goals: Week 1:  PT Short Term Goal 1 (Week 1): =LTG due to ELOS  Skilled Therapeutic Interventions/Progress Updates:   Received pt sitting in recliner, pt agreeable to therapy, and denied any pain during session. Session with emphasis on functional mobility/transfers, dressing, generalized strengthening, dynamic standing balance/coordination, NMR, gait training, and improved activity tolerance. Pt requested to change clothes prior to going to therapy and doffed shirt and pants sitting in recliner with supervision using lateral leans and donned bra and pull over shirt with set up assist. MD present for morning rounds. Donned pants sitting in recliner with max A and transferred sit<>stand with CGA and able to pull pants over hips with CGA. Donned socks and shoes sitting in recliner with max A for time management purposes. Pt ambulated 34ft with RW CGA to WC and performed WC mobility 163ft using bilateral UEs and supervision to therapy gym with increased time for improved UE strengthening. Pt ambulated 139ft x 2 trials with RW and CGA. Pt demonstrates decreased cadence overall with gait. Trial 1: with emphasis on weight shifting to R and R heel strike. Trial 2: performing head turns L/R/up/down, 360 degree turns, increasing/decreasing cadence, and stopping suddenly. Pt with increased fear of falling when turning and increasing cadence but no LOB noted. Pt transferred sit<>stand without AD and CGA with 3in step under L LE to emphasize R side weight shifting and R quad/glute strengthening 2x8. Pt required cues to avoid compensating using L LE. Pt ambulated 34ft without AD and CGA to WC and transported back to room in [facility] total A and ambulated 67ft with RW and CGA to recliner. Concluded  session with pt sitting in recliner, needs within reach, and chair pad alarm on.   Therapy Documentation Precautions:  Precautions Precautions: Fall Precaution Comments: right leg weakness, numbness, decreased coordination Restrictions Weight Bearing Restrictions: No  Therapy/Group: Individual Therapy Alfonse Alpers PT, DPT   07/21/2020, 7:34 AM

## 2020-07-21 NOTE — Progress Notes (Addendum)
[facility] PHYSICAL MEDICINE & REHABILITATION PROGRESS NOTE   Subjective/Complaints: Developed right sided neck pain yesterday with associated occipital headache. Feels much better this morning.   ROS: Patient denies fever, rash, sore throat, blurred vision, nausea, vomiting, diarrhea, cough, shortness of breath or chest pain, joint or back pain, headache, or mood change.   Objective:   No results found. Recent Labs    07/20/20 0954  WBC 7.3  HGB 15.4*  HCT 47.8*  PLT 227   Recent Labs    07/20/20 0954  NA 137  K 4.6  CL 105  CO2 22  GLUCOSE 162*  BUN 13  CREATININE 0.65  CALCIUM 9.3    Intake/Output Summary (Last 24 hours) at 07/21/2020 1119 Last data filed at 07/21/2020 0700 Gross per 24 hour  Intake 600 ml  Output --  Net 600 ml     Physical Exam: Vital Signs Blood pressure (!) 126/59, pulse 66, temperature 97.8 F (36.6 C), resp. rate 18, height 5\' 6"  (1.676 m), weight 99.4 kg, SpO2 99 %. Constitutional: No distress . Vital signs reviewed. obese HEENT: EOMI, oral membranes moist Neck: supple Cardiovascular: RRR without murmur. No JVD    Respiratory/Chest: CTA Bilaterally without wheezes or rales. Normal effort    GI/Abdomen: BS +, non-tender, non-distended Ext: no clubbing, cyanosis, or edema Psych: pleasant and cooperative Skin: Clean and intact without signs of breakdown Neurological:  Alert and oriented x 3. Normal insight and awareness. Intact Memory. Normal language and speech. Cranial nerve exam unremarkable .  Comments: Speech clear. Able to follow command without difficulty. RUE 4/5 prox to distal with mild PD. RLE 3- to 3/5 prox to distal. Sensory loss along right face, arm and leg. LUE/LLE 5/5.  Tender to palpation right side of neck and upper back muscles Musc: right SCM/trap still a little limiting with rotation of head to left much improved. Slightly tender today.   Assessment/Plan: 1. Functional deficits secondary to thalamic stroke  which require 3+ hours per day of interdisciplinary therapy in a comprehensive inpatient rehab setting.  Physiatrist is providing close team supervision and 24 hour management of active medical problems listed below.  Physiatrist and rehab team continue to assess barriers to discharge/monitor patient progress toward functional and medical goals  Care Tool:  Bathing    Body parts bathed by patient: Right arm, Left arm, Chest, Abdomen, Front perineal area, Buttocks, Left lower leg, Face, Right lower leg, Left upper leg, Right upper leg         Bathing assist Assist Level: Supervision/Verbal cueing     Upper Body Dressing/Undressing Upper body dressing   What is the patient wearing?: Bra, Pull over shirt    Upper body assist Assist Level: Set up assist    Lower Body Dressing/Undressing Lower body dressing      What is the patient wearing?: Underwear/pull up, Pants     Lower body assist Assist for lower body dressing: Supervision/Verbal cueing     Toileting Toileting    Toileting assist Assist for toileting: Supervision/Verbal cueing     Transfers Chair/bed transfer  Transfers assist     Chair/bed transfer assist level: Contact Guard/Touching assist     Locomotion Ambulation   Ambulation assist      Assist level: Contact Guard/Touching assist Assistive device: Walker-rolling Max distance: 150'   Walk 10 feet activity   Assist     Assist level: Contact Guard/Touching assist Assistive device: Walker-rolling   Walk 50 feet activity   Assist  Assist level: Contact Guard/Touching assist Assistive device: Walker-rolling    Walk 150 feet activity   Assist    Assist level: Contact Guard/Touching assist Assistive device: Walker-rolling    Walk 10 feet on uneven surface  activity   Assist     Assist level: Minimal Assistance - Patient > 75% Assistive device: Aeronautical engineer Will patient use wheelchair at  discharge?: No             Wheelchair 50 feet with 2 turns activity    Assist            Wheelchair 150 feet activity     Assist          Blood pressure (!) 126/59, pulse 66, temperature 97.8 F (36.6 C), resp. rate 18, height 5\' 6"  (1.676 m), weight 99.4 kg, SpO2 99 %.  Medical Problem List and Plan: 1.Right hemiparesis and functional deficitssecondary to left thalamic infarct -patient may shower -ELOS/Goals: mod I, 7-10 days  --Continue CIR therapies including PT, OT.  2. Antithrombotics: -DVT/anticoagulation:Pharmaceutical:Lovenox -antiplatelet therapy: DAPT X 3 weeks followed by ASA alone 3. Pain Management:encouraged continued use of heating pad for right cervical myofascial pain. Reviewed posture and mechanics as well.   -prn tylenol 4. Mood:LCSW to follow for evaluation and support. -antipsychotic agents: N/A 5. Neuropsych: This patientiscapable of making decisions on herown behalf. 6. Skin/Wound Care:Routine pressure relief measures. 7. Fluids/Electrolytes/Nutrition:Monitor I/O.    -LFT's sl elevated. Recheck next week. Is on statin 8. T2DM: Hgb a1C- 11.6 and poorly controlled. Has not been on any meds for years.Continue Lantus insulin--willing to go home on this.Monitor BS ac/hs and continue to use SSI for tighter BS control as indicated. Consult dietician for dietary education. Pt wants to improve her control and diabetes awareness.  9/9 lantus increased to 21 u yesterday.   -cbg's under better control   -DM coordinator d/w me adding tradjenta, stopping scheduled novolog. Will make those changes effective tomorrow morning. May be able to get her off the lantus.  9. HTN: Monitor BP tid-denies history of hypertension.Well controlled 9/9 10 Schizophrenia/Bipolar d/o/PTSD:Reports that it has been managed with psychotherapy. 11. OSA: Continue CPAP 12. Erythrocytosis:13.8-->16.0  acute rise during admission and question accuracy. Will monitor for trend.  13. Dyslipidemia: On Lipitor    LOS: 2 days A FACE TO FACE EVALUATION WAS PERFORMED  Meredith Staggers 07/21/2020, 11:19 AM

## 2020-07-21 NOTE — Progress Notes (Signed)
Inpatient Diabetes Program Recommendations  AACE/ADA: New Consensus Statement on Inpatient Glycemic Control (2015)  Target Ranges:  Prepandial:   less than 140 mg/dL      Peak postprandial:   less than 180 mg/dL (1-2 hours)      Critically ill patients:  140 - 180 mg/dL   Lab Results  Component Value Date   GLUCAP 121 (H) 07/21/2020   HGBA1C 11.6 (H) 07/14/2020    Review of Glycemic Control Results for SWEET, JARVIS (MRN [id number]) as of 07/21/2020 12:04  Ref. Range 07/20/2020 05:31 07/20/2020 12:03 07/20/2020 16:49 07/20/2020 20:23 07/21/2020 05:53  Glucose-Capillary Latest Ref Range: 70 - 99 mg/dL 125 (H) 118 (H) 152 (H) 122 (H) 121 (H)   Diabetes history: DM 2 Outpatient Diabetes medications: None however has been on Janumet in the past Current orders for Inpatient glycemic control:  Novolog moderate tid with meals and HS Novolog 4 units tid with meals Lantus 21 units q HS Inpatient Diabetes Program Recommendations:     Spoke with patient while she was inpatient.  She did not want to be on insulin at home and felt she could get her blood sugars under control with oral medications.  Blood sugars are much improved in rehab.    Consider d/c of Novolog meal coverage 4 units tid with meals.  Also please consider adding Tradjenta 5 mg daily. ? If patient could resume Janumet at d/c and avoid basal insulin at d/c?    Thanks,  Adah Perl, RN, BC-ADM Inpatient Diabetes Coordinator Pager [phone]

## 2020-07-22 ENCOUNTER — Inpatient Hospital Stay (HOSPITAL_COMMUNITY): Payer: Medicare PPO | Admitting: Occupational Therapy

## 2020-07-22 ENCOUNTER — Inpatient Hospital Stay (HOSPITAL_COMMUNITY): Payer: Medicare PPO | Admitting: Physical Therapy

## 2020-07-22 LAB — GLUCOSE, CAPILLARY
Glucose-Capillary: 123 mg/dL — ABNORMAL HIGH (ref 70–99)
Glucose-Capillary: 103 mg/dL — ABNORMAL HIGH (ref 70–99)
Glucose-Capillary: 134 mg/dL — ABNORMAL HIGH (ref 70–99)
Glucose-Capillary: 180 mg/dL — ABNORMAL HIGH (ref 70–99)

## 2020-07-22 NOTE — Progress Notes (Signed)
Physical Therapy Session Note  Patient Details  Name: [name] [name] MRN: [id number] Date of Birth: 03/27/1961  Today's Date: 07/22/2020 PT Individual Time: 0804-0901 PT Individual Time Calculation (min): 57 min   Short Term Goals: Week 1:  PT Short Term Goal 1 (Week 1): =LTG due to ELOS  Skilled Therapeutic Interventions/Progress Updates:  Pt received sitting on EOB & agreeable to tx. Educated pt on strokes & increased risk of having another one, as well as need to maintain healthy lifestyle & take medications as prescribed by MD - pt voices this education is reassuring to her. PT assists with donning R sock & B shoes for time management. Sit<>stand throughout session with supervision. Gait in room/bathroom with RW & CGA/supervision & pt completes toilet transfer with supervision. Pt with continent void on toilet & performs clothing management without assist, hand hygiene standing at sink with supervision. Gait room<>dayroom with RW & supervision with decreased gait speed, slightly decreased R hip flexion during swing phase but adequate dorsiflexion RLE & heel strike. Pt completes berg balance test & scores 34/56; educated pt on interpretation of score & current fall risk.  Patient demonstrates increased fall risk as noted by score of 34/56 on Berg Balance Scale.  (<36= high risk for falls, close to 100%; 37-45 significant >80%; 46-51 moderate >50%; 52-55 lower >25%). Pt performs 4" step taps with min assist with task focusing on weight shifting, dynamic balance, and RLE strengthening & NMR. Pt utilized kinetron in sitting then standing with BUE>no UE support with min assist with focus on weight shifting L<>R, BLE strengthening & R NMR. In room, PT educates pt on ambulating laterally within RW in small spaces with pt performing with supervision. Pt left in recliner with chair alarm donned, call bell & all needs in reach, MD in room.   Therapy Documentation Precautions:   Precautions Precautions: Fall Precaution Comments: right leg weakness, numbness, decreased coordination Restrictions Weight Bearing Restrictions: Yes  Pain: 5/10 lower back pain - pt reports she's premedicated & rest breaks provided PRN  Balance: Balance Balance Assessed: Yes Standardized Balance Assessment Standardized Balance Assessment: Berg Balance Test Berg Balance Test Sit to Stand: Able to stand without using hands and stabilize independently Standing Unsupported: Able to stand 2 minutes with supervision Sitting with Back Unsupported but Feet Supported on Floor or Stool: Able to sit safely and securely 2 minutes Stand to Sit: Sits safely with minimal use of hands Transfers: Able to transfer with verbal cueing and /or supervision Standing Unsupported with Eyes Closed: Able to stand 10 seconds with supervision Standing Ubsupported with Feet Together: Able to place feet together independently and stand for 1 minute with supervision From Standing, Reach Forward with Outstretched Arm: Can reach forward >12 cm safely (5") From Standing Position, Pick up Object from Floor: Unable to try/needs assist to keep balance (needs min assist for task) From Standing Position, Turn to Look Behind Over each Shoulder: Needs supervision when turning (turns L safely, supervision when turning R) Turn 360 Degrees: Needs close supervision or verbal cueing Standing Unsupported, Alternately Place Feet on Step/Stool: Needs assistance to keep from falling or unable to try Standing Unsupported, One Foot in Front: Able to take small step independently and hold 30 seconds Standing on One Leg: Able to lift leg independently and hold > 10 seconds (elects to stand on LLE, R foot just off of floor vs held up high) Total Score: 34   Therapy/Group: Individual Therapy  [name] 07/22/2020, 9:06 AM

## 2020-07-22 NOTE — Progress Notes (Signed)
Occupational Therapy Session Note  Patient Details  Name: [name] [name] MRN: [id number] Date of Birth: [birth year]  Today's Date: 07/22/2020 OT Individual Time: 1500-1600 OT Individual Time Calculation (min): 60 min    Short Term Goals: Week 1:  OT Short Term Goal 1 (Week 1): STGs = LTGs  Skilled Therapeutic Interventions/Progress Updates:    Pt received in recliner ready for therapy.  Pt used RW and ambulated to therapy room.  She continues to have RLE weakness which makes her gait pattern labored.    Focused today's session on R leg strength and RUE coordination/AROM: - supine bridges with B legs -bridges with R leg isolated with L leg on therapy ball -B feet with legs extended on ball bridges -sidelying clam shells -sidelying hip abduction and slight flexion -supine ab crunches with ball reach overhead -seated R shoulder stretches with UE ranger -R shoulder pilates exercises of small circles of arm at 90 degrees flex and abd with 1 lb wt.  Pt did extremely well with all exercises. Pt then ambulated back to the room with RW demonstrating a smoother gait pattern.   Therapy Documentation Precautions:  Precautions Precautions: Fall Precaution Comments: right leg weakness, numbness, decreased coordination Restrictions Weight Bearing Restrictions: Yes Therapy Vitals Temp: 98.3 F (36.8 C) Temp Source: Oral Pulse Rate: 79 Resp: 18 BP: 120/79 Patient Position (if appropriate): Sitting Oxygen Therapy SpO2: 95 % O2 Device: Room Air Pain: Pain Assessment Pain Score: 0-No pain ADL: ADL Eating: Set up Grooming: Supervision/safety Where Assessed-Grooming: Standing at sink Upper Body Bathing: Supervision/safety Where Assessed-Upper Body Bathing: Shower Lower Body Bathing: Supervision/safety Where Assessed-Lower Body Bathing: Shower Upper Body Dressing: Setup Where Assessed-Upper Body Dressing: Edge of bed Lower Body Dressing: Supervision/safety Where  Assessed-Lower Body Dressing: Edge of bed Toileting: Supervision/safety Where Assessed-Toileting: Glass blower/designer: Therapist, music Method: Magazine features editor: Curator Method: Heritage manager: Radio broadcast assistant, Grab bars   Therapy/Group: Individual Therapy  [place] 07/22/2020, 4:36 PM

## 2020-07-22 NOTE — Progress Notes (Signed)
Physical Therapy Session Note  Patient Details  Name: [name] [name] MRN: [id number] Date of Birth: 01/12/1961  Today's Date: 07/22/2020 PT Individual Time: 1004-1100 and 1130-1145 PT Individual Time Calculation (min): 56 min and 15 min.  Short Term Goals: Week 1:  PT Short Term Goal 1 (Week 1): =LTG due to ELOS  Skilled Therapeutic Interventions/Progress Updates: Pt seen in 2 sessions d/t PT mistake as to length of rx time.  Pt presents both sessions sitting in recliner and eager for therapy.  Pt transfers sit to stand w/ supervision.  Pt amb in room and into hallways to gym w/ supervision up to 150' w/ noted cadence difference to right.  Educated pt on differences.  Pt performed Nu-step on Level 5 for 6' and 4' w/ 1 rest break between.  Pt encouraged to emphasize R extremities.  Pt amb short distances w/o AD and CGA w/ noted high guard arm position.  Pt performed standing toe taps to small cone and then to elevated cone w/ CGA to min A, w/ emphasis on safety rather than speed.  Pt performed standing cone stacking/unstacking w/ R foot on 3" cushioned surface, reaching outside of BOS and to side.  Pt amb w/o AD and min to CGA stepping over canes and stepping onto 3 3/4" platform.  Pt returned to room and to recliner w/ seat alarm on and all needs in reach.  PT returned to room for continued therapy, pt agreeable.  Pt amb multiple trials w/ RW and supervision > 150'.  Pt requires verbal cues for decreased pressure UEs during gait.  Pt negotiated (8) 6" steps (4 x 2) w/ B rails and  CGA.  Pt negotiated w/ reciprocal gait, although noted weakness RLE w/ descent.  Pt amb back to room and into recliner.  Seat alarm on and all needs in reach,  Spouse present at conclusion.      Therapy Documentation Precautions:  Precautions Precautions: Fall Precaution Comments: right leg weakness, numbness, decreased coordination Restrictions Weight Bearing Restrictions: Yes General:   Vital Signs:  Pain:no  c/o pain. Pain Assessment Pain Scale: 0-10 Pain Score: 0-No pain Mobility:   Locomotion :    Trunk/Postural Assessment :    Balance: Balance Balance Assessed: Yes Standardized Balance Assessment Standardized Balance Assessment: Berg Balance Test Berg Balance Test Sit to Stand: Able to stand without using hands and stabilize independently Standing Unsupported: Able to stand 2 minutes with supervision Sitting with Back Unsupported but Feet Supported on Floor or Stool: Able to sit safely and securely 2 minutes Stand to Sit: Sits safely with minimal use of hands Transfers: Able to transfer with verbal cueing and /or supervision Standing Unsupported with Eyes Closed: Able to stand 10 seconds with supervision Standing Ubsupported with Feet Together: Able to place feet together independently and stand for 1 minute with supervision From Standing, Reach Forward with Outstretched Arm: Can reach forward >12 cm safely (5") From Standing Position, Pick up Object from Floor: Unable to try/needs assist to keep balance (needs min assist for task) From Standing Position, Turn to Look Behind Over each Shoulder: Needs supervision when turning (turns L safely, supervision when turning R) Turn 360 Degrees: Needs close supervision or verbal cueing Standing Unsupported, Alternately Place Feet on Step/Stool: Needs assistance to keep from falling or unable to try Standing Unsupported, One Foot in Front: Able to take small step independently and hold 30 seconds Standing on One Leg: Able to lift leg independently and hold > 10 seconds (elects to  stand on LLE, R foot just off of floor vs held up high) Total Score: 34 Exercises:   Other Treatments:      Therapy/Group: Individual Therapy  Lucio Edward 07/22/2020, 11:01 AM

## 2020-07-22 NOTE — Progress Notes (Signed)
[place] PHYSICAL MEDICINE & REHABILITATION PROGRESS NOTE   Subjective/Complaints: Asks this morning that no medications be changed without discussing with her first. She did try the diabetes medication this morning and feels she tolerated it well. She has been set up to see a diabetes clinic in outpatient and will follow-up with her PCP as well.   ROS: Patient denies fever, rash, sore throat, blurred vision, nausea, vomiting, diarrhea, cough, shortness of breath or chest pain, joint or back pain, headache, or mood change.   Objective:   No results found. Recent Labs    07/20/20 0954  WBC 7.3  HGB 15.4*  HCT 47.8*  PLT 227   Recent Labs    07/20/20 0954  NA 137  K 4.6  CL 105  CO2 22  GLUCOSE 162*  BUN 13  CREATININE 0.65  CALCIUM 9.3    Intake/Output Summary (Last 24 hours) at 07/22/2020 0914 Last data filed at 07/22/2020 0700 Gross per 24 hour  Intake 935 ml  Output --  Net 935 ml     Physical Exam: Vital Signs Blood pressure (!) 142/81, pulse 67, temperature 97.8 F (36.6 C), resp. rate 17, height 5\' 6"  (1.676 m), weight 99.4 kg, SpO2 100 %. General: Alert and oriented x 3, No apparent distress HEENT: Head is normocephalic, atraumatic, PERRLA, EOMI, sclera anicteric, oral mucosa pink and moist, dentition intact, ext ear canals clear,  Neck: Supple without JVD or lymphadenopathy Heart: Reg rate and rhythm. No murmurs rubs or gallops Chest: CTA bilaterally without wheezes, rales, or rhonchi; no distress Abdomen: Soft, non-tender, non-distended, bowel sounds positive. Extremities: No clubbing, cyanosis, or edema. Pulses are 2+ Skin: Clean and intact without signs of breakdown Neurological:  Alert and oriented x 3. Normal insight and awareness. Intact Memory. Normal language and speech. Cranial nerve exam unremarkable .  Comments: Speech clear. Able to follow command without difficulty. RUE 4/5 prox to distal with mild PD. RLE 3- to 3/5 prox to distal.  Sensory loss along right face, arm and leg. LUE/LLE 5/5.  Tender to palpation right side of neck and upper back muscles Musc: right SCM/trap still a little limiting with rotation of head to left much improved. Slightly tender today.    Assessment/Plan: 1. Functional deficits secondary to thalamic stroke which require 3+ hours per day of interdisciplinary therapy in a comprehensive inpatient rehab setting.  Physiatrist is providing close team supervision and 24 hour management of active medical problems listed below.  Physiatrist and rehab team continue to assess barriers to discharge/monitor patient progress toward functional and medical goals  Care Tool:  Bathing    Body parts bathed by patient: Right arm, Left arm, Chest, Abdomen, Front perineal area, Buttocks, Left lower leg, Face, Right lower leg, Left upper leg, Right upper leg         Bathing assist Assist Level: Supervision/Verbal cueing     Upper Body Dressing/Undressing Upper body dressing   What is the patient wearing?: Pull over shirt    Upper body assist Assist Level: Set up assist    Lower Body Dressing/Undressing Lower body dressing      What is the patient wearing?: Pants, Underwear/pull up     Lower body assist Assist for lower body dressing: Set up assist     Toileting Toileting    Toileting assist Assist for toileting: Supervision/Verbal cueing     Transfers Chair/bed transfer  Transfers assist     Chair/bed transfer assist level: Supervision/Verbal cueing     Locomotion  Ambulation   Ambulation assist      Assist level: Supervision/Verbal cueing Assistive device: Walker-rolling Max distance: 150 ft   Walk 10 feet activity   Assist     Assist level: Supervision/Verbal cueing Assistive device: Walker-rolling   Walk 50 feet activity   Assist    Assist level: Supervision/Verbal cueing Assistive device: Walker-rolling    Walk 150 feet activity   Assist    Assist  level: Supervision/Verbal cueing Assistive device: Walker-rolling    Walk 10 feet on uneven surface  activity   Assist     Assist level: Minimal Assistance - Patient > 75% Assistive device: Aeronautical engineer Will patient use wheelchair at discharge?: No Type of Wheelchair: Manual    Wheelchair assist level: Supervision/Verbal cueing Max wheelchair distance: 119ft    Wheelchair 50 feet with 2 turns activity    Assist        Assist Level: Supervision/Verbal cueing   Wheelchair 150 feet activity     Assist      Assist Level: Supervision/Verbal cueing   Blood pressure (!) 142/81, pulse 67, temperature 97.8 F (36.6 C), resp. rate 17, height 5\' 6"  (1.676 m), weight 99.4 kg, SpO2 100 %.  Medical Problem List and Plan: 1.Right hemiparesis and functional deficitssecondary to left thalamic infarct -patient may shower -ELOS/Goals: mod I, 7-10 days  --Continue CIR therapies including PT, OT.  2. Antithrombotics: -DVT/anticoagulation:Pharmaceutical:Lovenox -antiplatelet therapy: DAPT X 3 weeks followed by ASA alone 3. Pain Management:encouraged continued use of heating pad for right cervical myofascial pain. Reviewed posture and mechanics as well.   -prn tylenol- does not want Tramadol again as it made her feel "high" 4. Mood:LCSW to follow for evaluation and support. -antipsychotic agents: N/A 5. Neuropsych: This patientiscapable of making decisions on herown behalf. 6. Skin/Wound Care:Routine pressure relief measures. 7. Fluids/Electrolytes/Nutrition:Monitor I/O.    -LFT's sl elevated. Recheck next week. Is on statin 8. T2DM: Hgb a1C- 11.6 and poorly controlled. Has not been on any meds for years.Continue Lantus insulin--willing to go home on this.Monitor BS ac/hs and continue to use SSI for tighter BS control as indicated. Consult dietician for dietary education. Pt  wants to improve her control and diabetes awareness.  9/9 lantus increased to 21 u yesterday.   -cbg's under better control   -DM coordinator d/w me adding tradjenta, stopping scheduled novolog. Will make those changes effective tomorrow morning. May be able to get her off the lantus.   9/10: much better controlled.  9. HTN: Monitor BP tid-denies history of hypertension.Well controlled 9/9 10 Schizophrenia/Bipolar d/o/PTSD:Reports that it has been managed with psychotherapy. 11. OSA: Continue CPAP 12. Erythrocytosis:13.8-->16.0 acute rise during admission and question accuracy. Will monitor for trend.  13. Dyslipidemia: On Lipitor 14. General: Patient requests that all medication changes be discussed with her first.    LOS: 3 days A FACE TO FACE EVALUATION WAS PERFORMED  Martha Clan P Kofi Murrell 07/22/2020, 9:14 AM

## 2020-07-23 ENCOUNTER — Inpatient Hospital Stay (HOSPITAL_COMMUNITY): Payer: Medicare PPO | Admitting: Physical Therapy

## 2020-07-23 ENCOUNTER — Encounter (HOSPITAL_COMMUNITY): Payer: Medicare PPO | Admitting: Occupational Therapy

## 2020-07-23 ENCOUNTER — Inpatient Hospital Stay (HOSPITAL_COMMUNITY): Payer: Medicare PPO | Admitting: Occupational Therapy

## 2020-07-23 DIAGNOSIS — D751 Secondary polycythemia: Secondary | ICD-10-CM

## 2020-07-23 DIAGNOSIS — R7401 Elevation of levels of liver transaminase levels: Secondary | ICD-10-CM

## 2020-07-23 DIAGNOSIS — E1165 Type 2 diabetes mellitus with hyperglycemia: Secondary | ICD-10-CM

## 2020-07-23 DIAGNOSIS — Z794 Long term (current) use of insulin: Secondary | ICD-10-CM

## 2020-07-23 DIAGNOSIS — R0989 Other specified symptoms and signs involving the circulatory and respiratory systems: Secondary | ICD-10-CM

## 2020-07-23 LAB — GLUCOSE, CAPILLARY
Glucose-Capillary: 112 mg/dL — ABNORMAL HIGH (ref 70–99)
Glucose-Capillary: 118 mg/dL — ABNORMAL HIGH (ref 70–99)
Glucose-Capillary: 122 mg/dL — ABNORMAL HIGH (ref 70–99)
Glucose-Capillary: 134 mg/dL — ABNORMAL HIGH (ref 70–99)

## 2020-07-23 NOTE — Progress Notes (Signed)
Occupational Therapy Session Note  Patient Details  Name: [name] [name] MRN: [id number] Date of Birth: [birth year]  Today's Date: 07/23/2020 OT Group Time: 1100-1200 OT Group Time Calculation (min): 60 min   Skilled Therapeutic Interventions/Progress Updates:    Pt engaged in therapeutic w/c level dance group focusing on patient choice, UE/LE strengthening, salience, activity tolerance, and social participation. Pt was guided through various dance-based exercises involving UEs/LEs and trunk. All music was selected by group members. Emphasis placed on Rt sided coordination and dynamic balance. Pt exhibited high levels of participation throughout group, visibly motivated by the other group members. She also stood with RW and supervision assist while dancing to music during 2 songs, incorporating both UEs and LEs in beat to music. At end of session she was returned to the room by NT.    Therapy Documentation Precautions:  Precautions Precautions: Fall Precaution Comments: right leg weakness, numbness, decreased coordination Restrictions Weight Bearing Restrictions: No Pain: no s/s pain during session   ADL: ADL Eating: Set up Grooming: Supervision/safety Where Assessed-Grooming: Standing at sink Upper Body Bathing: Supervision/safety Where Assessed-Upper Body Bathing: Shower Lower Body Bathing: Supervision/safety Where Assessed-Lower Body Bathing: Shower Upper Body Dressing: Setup Where Assessed-Upper Body Dressing: Edge of bed Lower Body Dressing: Supervision/safety Where Assessed-Lower Body Dressing: Edge of bed Toileting: Supervision/safety Where Assessed-Toileting: Glass blower/designer: Therapist, music Method: Magazine features editor: Curator Method: Heritage manager: Radio broadcast assistant, Grab bars      Therapy/Group: Group Therapy  Skeet Simmer 07/23/2020, 12:37 PM

## 2020-07-23 NOTE — Progress Notes (Signed)
Physical Therapy Session Note  Patient Details  Name: [name] [name] MRN: [id number] Date of Birth: [birth year]  Today's Date: 07/23/2020 PT Individual Time: 6734-1937 PT Individual Time Calculation (min): 43 min   Short Term Goals: Week 1:  PT Short Term Goal 1 (Week 1): =LTG due to ELOS  Skilled Therapeutic Interventions/Progress Updates:   Pt received sitting in recliner and agreeable to PT. Stand pivot transfer to [facility] with RW and supervision assist. Pt transported  To entrance to [facility]. Gait training wth RW over unlevel cement sidewalk 2 x 272f with supervision assist from PT with min cues for improved hip/knee flexion with RLEW limb advancement. Mild R hip instability noted, but no LOB. Additional gait training without AD over cement sidewalk x 55f [id number] from PT with min cues for symmetry of step length R and L. With decreased force of heel contact on the L to improve R gluteal activation. Patient returned to room and performed ambulatory transfer to recliner with supervision assist from PT for safety. Pt left sitting in recliner with call bell in reach and all needs met.         Therapy Documentation Precautions:  Precautions Precautions: Fall Precaution Comments: right leg weakness, numbness, decreased coordination Restrictions Weight Bearing Restrictions: No   Pain: Pain Assessment Pain Scale: 0-10 Pain Score: 0-No pain  Therapy/Group: Individual Therapy  AustLorie Phenix1/2021, 1:49 PM

## 2020-07-23 NOTE — Progress Notes (Signed)
Physical Therapy Session Note  Patient Details  Name: [name] [name] Name MRN: [id number] Date of Birth: June 19, 1961  Today's Date: 07/23/2020 PT Individual Time: 1440-1558 PT Individual Time Calculation (min): 78 min   Short Term Goals: Week 1:  PT Short Term Goal 1 (Week 1): =LTG due to ELOS  Skilled Therapeutic Interventions/Progress Updates:   Pt received sitting in Recliner and agreeable to PT. Sit<>stand transfer with supervision assist from Pt throughout session from all surfaces.   Gait training with supervision assist and on AD through rehab unit [id number] and [id number] Min cues for increased speed to normalize gait pattern.    Dynamic balance training with Wii fit. penguin slide, table tilt each completed x 2 with min-mod cues for improved use of ankle strategy to shift weight and improve COM control. Pt then perform dynamic balance training while engaged in fine motor control with wii resort archery x 4 rounds and canoe speed challenge x 3 rounds. Cues for improved trunk rotation and weight shifting R and L to improve COM control. Bowling while standing on airex pad x 10 frames with supervision assist to maintain balance and min cues to prevent posterior bias and symmetric WB through BLE.   Patient returned to room and performed stand pivot to recliner with supervision assist for safety and no AD. Pt left sitting in recliner with call bell in reach and all needs met.             Therapy Documentation Precautions:  Precautions Precautions: Fall Precaution Comments: right leg weakness, numbness, decreased coordination Restrictions Weight Bearing Restrictions: No Vital Signs: Therapy Vitals Temp: 98.6 F (37 C) Temp Source: Oral Pulse Rate: 81 Resp: 12 BP: (!) 145/86 Patient Position (if appropriate): Sitting Oxygen Therapy SpO2: 98 % O2 Device: Room Air Pain:     Therapy/Group: Individual Therapy  AuLorie Phenix/09/2020, 5:14 PM

## 2020-07-23 NOTE — Progress Notes (Signed)
[place] PHYSICAL MEDICINE & REHABILITATION PROGRESS NOTE   Subjective/Complaints: Patient seen sitting up in bed this morning.  She states she slept well overnight.  She denies complaints.  ROS: Denies CP, SOB, N/V/D  Objective:   No results found. No results for input(s): WBC, HGB, HCT, PLT in the last 72 hours. No results for input(s): NA, K, CL, CO2, GLUCOSE, BUN, CREATININE, CALCIUM in the last 72 hours.  Intake/Output Summary (Last 24 hours) at 07/23/2020 1407 Last data filed at 07/23/2020 0746 Gross per 24 hour  Intake 480 ml  Output --  Net 480 ml     Physical Exam: Vital Signs Blood pressure (!) 154/93, pulse 71, temperature 97.9 F (36.6 C), temperature source Oral, resp. rate 18, height 5\' 6"  (1.676 m), weight 99.4 kg, SpO2 100 %. Constitutional: No distress . Vital signs reviewed. HENT: Normocephalic.  Atraumatic. Eyes: EOMI. No discharge. Cardiovascular: No JVD.  RRR. Respiratory: Normal effort.  No stridor.  Bilateral clear to auscultation. GI: Non-distended.  BS +. Skin: Warm and dry.  Intact. Psych: Normal mood.  Normal behavior. Musc: No edema in extremities.  No tenderness in extremities. Neuro: Alert Motor: RUE: 4+/5 proximal distal RLE: 4 -/5 proximal distal LUE/LLE: 5/5 proximal and distal  Assessment/Plan: 1. Functional deficits secondary to thalamic stroke which require 3+ hours per day of interdisciplinary therapy in a comprehensive inpatient rehab setting.  Physiatrist is providing close team supervision and 24 hour management of active medical problems listed below.  Physiatrist and rehab team continue to assess barriers to discharge/monitor patient progress toward functional and medical goals  Care Tool:  Bathing    Body parts bathed by patient: Right arm, Left arm, Chest, Abdomen, Front perineal area, Buttocks, Left lower leg, Face, Right lower leg, Left upper leg, Right upper leg         Bathing assist Assist Level:  Supervision/Verbal cueing     Upper Body Dressing/Undressing Upper body dressing   What is the patient wearing?: Pull over shirt    Upper body assist Assist Level: Supervision/Verbal cueing    Lower Body Dressing/Undressing Lower body dressing      What is the patient wearing?: Pants, Underwear/pull up     Lower body assist Assist for lower body dressing: Supervision/Verbal cueing     Toileting Toileting    Toileting assist Assist for toileting: Supervision/Verbal cueing     Transfers Chair/bed transfer  Transfers assist     Chair/bed transfer assist level: Supervision/Verbal cueing     Locomotion Ambulation   Ambulation assist      Assist level: Supervision/Verbal cueing Assistive device: Walker-rolling Max distance: 150   Walk 10 feet activity   Assist     Assist level: Supervision/Verbal cueing Assistive device: Walker-rolling   Walk 50 feet activity   Assist    Assist level: Supervision/Verbal cueing Assistive device: Walker-rolling    Walk 150 feet activity   Assist    Assist level: Supervision/Verbal cueing Assistive device: Walker-rolling    Walk 10 feet on uneven surface  activity   Assist     Assist level: Minimal Assistance - Patient > 75% Assistive device: Aeronautical engineer Will patient use wheelchair at discharge?: No Type of Wheelchair: Manual    Wheelchair assist level: Supervision/Verbal cueing Max wheelchair distance: 184ft    Wheelchair 50 feet with 2 turns activity    Assist        Assist Level: Supervision/Verbal cueing   Wheelchair 150 feet  activity     Assist      Assist Level: Minimal Assistance - Patient > 75%   Blood pressure (!) 154/93, pulse 71, temperature 97.9 F (36.6 C), temperature source Oral, resp. rate 18, height 5\' 6"  (1.676 m), weight 99.4 kg, SpO2 100 %.  Medical Problem List and Plan: 1.Right hemiparesis and functional  deficitssecondary to left thalamic infarct  Continue CIR  2. Antithrombotics: -DVT/anticoagulation:Pharmaceutical:Lovenox -antiplatelet therapy: DAPT X 3 weeks followed by ASA alone 3. Pain Management:encouraged continued use of heating pad for right cervical myofascial pain. Reviewed posture and mechanics as well.   -prn tylenol- does not want Tramadol again as it made her feel "high"  Controlled on 9/11 4. Mood:LCSW to follow for evaluation and support. -antipsychotic agents: N/A 5. Neuropsych: This patientiscapable of making decisions on herown behalf. 6. Skin/Wound Care:Routine pressure relief measures. 7. Fluids/Electrolytes/Nutrition:Monitor I/Os.   8. T2DM with hyperglycemia: Hgb a1C- 11.6 and poorly controlled. Has not been on any meds for years.Continue Lantus insulin--willing to go home on this.Monitor BS ac/hs and continue to use SSI for tighter BS control as indicated. Consult dietician for dietary education. Pt wants to improve her control and diabetes awareness.  Lantus increased to 21   Tradjenta  Slightly labile on 9/11 9. HTN: Monitor BP tid-denies history of hypertension.  Slightly labile on 9/11 10 Schizophrenia/Bipolar d/o/PTSD:Reports that it has been managed with psychotherapy. 11. OSA: Continue CPAP 12. Erythrocytosis:  Hemoglobin 15.49/8, continue to monitor 13. Dyslipidemia: Lipitor 14.  Transaminitis  LFTs elevated on 9/8, repeat labs next week  LOS: 4 days A FACE TO FACE EVALUATION WAS PERFORMED  Evalyse Stroope Lorie Phenix 07/23/2020, 2:07 PM

## 2020-07-23 NOTE — Progress Notes (Signed)
Occupational Therapy Session Note  Patient Details  Name: [name] [name] MRN: [id number] Date of Birth: 1961/08/09  Today's Date: 07/23/2020 OT Individual Time: 0850-1002 OT Individual Time Calculation (min): 72 min    Short Term Goals: Week 1:  OT Short Term Goal 1 (Week 1): STGs = LTGs  Skilled Therapeutic Interventions/Progress Updates:    Treatment session with focus on self-care retraining, functional mobility, and RUE NMR.  Pt received upright in recliner agreeable to shower this session.  Pt ambulated around room with RW to gather items prior to shower.  Completed bathing at sit > stand level in room shower with distant supervision.  Pt reports utilizing RUE at non-denominant level due to decreased grasp and motor control during bathing.  Pt completed dressing from EOB without assistance.  Required close supervision when donning Rt sock due to pt reports of instability when reaching towards foot and difficulty with bending RLE up to reach foot.  Pt ambulated to therapy gym with RW with supervision.  Engaged in [place] In sitting with focus on gross and fine motor control with reaching for cup to Rt as well as across midline, pouring small manipulatives on to table and flipping over manipulatives with increased time.  Pt continues to report decreased sensation and control with grasp and manipulation.  Engaged in pouring activity with styrofoam and plastic cup with pt able to utilize flexibility of cup to increase grasp and motor control.  Discussed functional carryover of each task and modifications to continue to increase independence with ADLs and IADLs.  Pt returned to room and left upright in recliner with all needs in reach and chair alarm on.  Therapy Documentation Precautions:  Precautions Precautions: Fall Precaution Comments: right leg weakness, numbness, decreased coordination Restrictions Weight Bearing Restrictions: No Pain:  Pt with no c/o pain   Therapy/Group:  Individual Therapy  Simonne Come 07/23/2020, 12:15 PM

## 2020-07-24 DIAGNOSIS — R030 Elevated blood-pressure reading, without diagnosis of hypertension: Secondary | ICD-10-CM

## 2020-07-24 LAB — GLUCOSE, CAPILLARY
Glucose-Capillary: 119 mg/dL — ABNORMAL HIGH (ref 70–99)
Glucose-Capillary: 115 mg/dL — ABNORMAL HIGH (ref 70–99)
Glucose-Capillary: 140 mg/dL — ABNORMAL HIGH (ref 70–99)
Glucose-Capillary: 123 mg/dL — ABNORMAL HIGH (ref 70–99)

## 2020-07-24 NOTE — Progress Notes (Signed)
[place] PHYSICAL MEDICINE & REHABILITATION PROGRESS NOTE   Subjective/Complaints: Patient seen sitting up in her chair this morning eating breakfast.  She states she slept well overnight.  She believes she is progressing.  ROS: Denies CP, SOB, N/V/D  Objective:   No results found. No results for input(s): WBC, HGB, HCT, PLT in the last 72 hours. No results for input(s): NA, K, CL, CO2, GLUCOSE, BUN, CREATININE, CALCIUM in the last 72 hours.  Intake/Output Summary (Last 24 hours) at 07/24/2020 1509 Last data filed at 07/24/2020 0900 Gross per 24 hour  Intake 600 ml  Output --  Net 600 ml     Physical Exam: Vital Signs Blood pressure (!) 147/72, pulse 77, temperature 98.7 F (37.1 C), resp. rate 17, height 5\' 6"  (1.676 m), weight 99.4 kg, SpO2 97 %.  Constitutional: No distress . Vital signs reviewed. HENT: Normocephalic.  Atraumatic. Eyes: EOMI. No discharge. Cardiovascular: No JVD.  RRR. Respiratory: Normal effort.  No stridor.  Bilateral clear to auscultation. GI: Non-distended.  BS +. Skin: Warm and dry.  Intact. Psych: Normal mood.  Normal behavior. Musc: No edema in extremities.  No tenderness in extremities. Neuro: Alert Motor: RUE: 4+/5 proximal distal RLE: 4 -/5 proximal distal, improving LUE/LLE: 5/5 proximal and distal  Assessment/Plan: 1. Functional deficits secondary to thalamic stroke which require 3+ hours per day of interdisciplinary therapy in a comprehensive inpatient rehab setting.  Physiatrist is providing close team supervision and 24 hour management of active medical problems listed below.  Physiatrist and rehab team continue to assess barriers to discharge/monitor patient progress toward functional and medical goals  Care Tool:  Bathing    Body parts bathed by patient: Right arm, Left arm, Chest, Abdomen, Front perineal area, Buttocks, Left lower leg, Face, Right lower leg, Left upper leg, Right upper leg         Bathing assist Assist  Level: Supervision/Verbal cueing     Upper Body Dressing/Undressing Upper body dressing   What is the patient wearing?: Pull over shirt    Upper body assist Assist Level: Supervision/Verbal cueing    Lower Body Dressing/Undressing Lower body dressing      What is the patient wearing?: Pants, Underwear/pull up     Lower body assist Assist for lower body dressing: Supervision/Verbal cueing     Toileting Toileting    Toileting assist Assist for toileting: Supervision/Verbal cueing     Transfers Chair/bed transfer  Transfers assist     Chair/bed transfer assist level: Supervision/Verbal cueing     Locomotion Ambulation   Ambulation assist      Assist level: Supervision/Verbal cueing Assistive device: Walker-rolling Max distance: 150   Walk 10 feet activity   Assist     Assist level: Supervision/Verbal cueing Assistive device: Walker-rolling   Walk 50 feet activity   Assist    Assist level: Supervision/Verbal cueing Assistive device: Walker-rolling    Walk 150 feet activity   Assist    Assist level: Supervision/Verbal cueing Assistive device: Walker-rolling    Walk 10 feet on uneven surface  activity   Assist     Assist level: Minimal Assistance - Patient > 75% Assistive device: Aeronautical engineer Will patient use wheelchair at discharge?: No Type of Wheelchair: Manual    Wheelchair assist level: Supervision/Verbal cueing Max wheelchair distance: 176ft    Wheelchair 50 feet with 2 turns activity    Assist        Assist Level: Supervision/Verbal cueing  Wheelchair 150 feet activity     Assist      Assist Level: Minimal Assistance - Patient > 75%   Blood pressure (!) 147/72, pulse 77, temperature 98.7 F (37.1 C), resp. rate 17, height 5\' 6"  (1.676 m), weight 99.4 kg, SpO2 97 %.  Medical Problem List and Plan: 1.Right hemiparesis and functional deficitssecondary to left thalamic  infarct  Continue CIR  2. Antithrombotics: -DVT/anticoagulation:Pharmaceutical:Lovenox -antiplatelet therapy: DAPT X 3 weeks followed by ASA alone 3. Pain Management:encouraged continued use of heating pad for right cervical myofascial pain.   -prn tylenol- does not want Tramadol again as it made her feel "high"  Controlled on 9/12 4. Mood:LCSW to follow for evaluation and support. -antipsychotic agents: N/A 5. Neuropsych: This patientiscapable of making decisions on herown behalf. 6. Skin/Wound Care:Routine pressure relief measures. 7. Fluids/Electrolytes/Nutrition:Monitor I/Os.   8. T2DM with hyperglycemia: Hgb a1C- 11.6 and poorly controlled. Has not been on any meds for years.Continue Lantus insulin--willing to go home on this.Monitor BS ac/hs and continue to use SSI for tighter BS control as indicated. Consult dietician for dietary education. Pt wants to improve her control and diabetes awareness.  Lantus increased to 21   Tradjenta  Relatively controlled per hospital setting on 9/12 9. HTN: Monitor BP tid-denies history of hypertension.  Slightly elevated on 9/12, will consider introduction of medications if persistent 10 Schizophrenia/Bipolar d/o/PTSD:Reports that it has been managed with psychotherapy. 11. OSA: Continue CPAP 12. Erythrocytosis:  Hemoglobin 15.4 on 9/8, continue to monitor 13. Dyslipidemia: Lipitor 14.  Transaminitis  LFTs elevated on 9/8, repeat labs this week  LOS: 5 days A FACE TO FACE EVALUATION WAS PERFORMED  Yittel Emrich Lorie Phenix 07/24/2020, 3:09 PM

## 2020-07-25 ENCOUNTER — Inpatient Hospital Stay (HOSPITAL_COMMUNITY): Payer: Medicare PPO | Admitting: Physical Therapy

## 2020-07-25 ENCOUNTER — Inpatient Hospital Stay (HOSPITAL_COMMUNITY): Payer: Medicare PPO | Admitting: Occupational Therapy

## 2020-07-25 LAB — COMPREHENSIVE METABOLIC PANEL
ALT: 46 U/L — ABNORMAL HIGH (ref 0–44)
AST: 43 U/L — ABNORMAL HIGH (ref 15–41)
Albumin: 3.7 g/dL (ref 3.5–5.0)
Alkaline Phosphatase: 80 U/L (ref 38–126)
Anion gap: 12 (ref 5–15)
BUN: 16 mg/dL (ref 6–20)
CO2: 20 mmol/L — ABNORMAL LOW (ref 22–32)
Calcium: 9.5 mg/dL (ref 8.9–10.3)
Chloride: 105 mmol/L (ref 98–111)
Creatinine, Ser: 0.7 mg/dL (ref 0.44–1.00)
GFR calc Af Amer: >60 mL/min (ref >60)
GFR calc non Af Amer: >60 mL/min (ref >60)
Glucose, Bld: 140 mg/dL — ABNORMAL HIGH (ref 70–99)
Potassium: 4.2 mmol/L (ref 3.5–5.1)
Sodium: 137 mmol/L (ref 135–145)
Total Bilirubin: 0.7 mg/dL (ref 0.3–1.2)
Total Protein: 7 g/dL (ref 6.5–8.1)

## 2020-07-25 LAB — CBC
HCT: 49 % — ABNORMAL HIGH (ref 36.0–46.0)
Hemoglobin: 15.7 g/dL — ABNORMAL HIGH (ref 12.0–15.0)
MCH: 30.4 pg (ref 26.0–34.0)
MCHC: 32 g/dL (ref 30.0–36.0)
MCV: 94.8 fL (ref 80.0–100.0)
Platelets: 218 10*3/uL (ref 150–400)
RBC: 5.17 MIL/uL — ABNORMAL HIGH (ref 3.87–5.11)
RDW: 12 % (ref 11.5–15.5)
WBC: 8.3 10*3/uL (ref 4.0–10.5)
nRBC: 0 % (ref 0.0–0.2)

## 2020-07-25 LAB — GLUCOSE, CAPILLARY
Glucose-Capillary: 116 mg/dL — ABNORMAL HIGH (ref 70–99)
Glucose-Capillary: 105 mg/dL — ABNORMAL HIGH (ref 70–99)
Glucose-Capillary: 177 mg/dL — ABNORMAL HIGH (ref 70–99)
Glucose-Capillary: 134 mg/dL — ABNORMAL HIGH (ref 70–99)

## 2020-07-25 NOTE — Progress Notes (Signed)
[place] PHYSICAL MEDICINE & REHABILITATION PROGRESS NOTE   Subjective/Complaints: Overall doing well. Some anxiety at times over recurring stroke symptoms (ie similar symptoms when she's tired). Asked if she could have grounds pass  ROS: Patient denies fever, rash, sore throat, blurred vision, nausea, vomiting, diarrhea, cough, shortness of breath or chest pain, joint or back pain, headache, or mood change.   Objective:   No results found. No results for input(s): WBC, HGB, HCT, PLT in the last 72 hours. No results for input(s): NA, K, CL, CO2, GLUCOSE, BUN, CREATININE, CALCIUM in the last 72 hours.  Intake/Output Summary (Last 24 hours) at 07/25/2020 1215 Last data filed at 07/25/2020 0700 Gross per 24 hour  Intake 840 ml  Output -  Net 840 ml     Physical Exam: Vital Signs Blood pressure 137/86, pulse 70, temperature 98.2 F (36.8 C), temperature source Oral, resp. rate 17, height 5\' 6"  (1.676 m), weight 99.4 kg, SpO2 98 %.  Constitutional: No distress . Vital signs reviewed. obese HEENT: EOMI, oral membranes moist Neck: supple Cardiovascular: RRR without murmur. No JVD    Respiratory/Chest: CTA Bilaterally without wheezes or rales. Normal effort    GI/Abdomen: BS +, non-tender, non-distended Ext: no clubbing, cyanosis, or edema Psych: pleasant and cooperative Musc: No edema in extremities.  No tenderness in extremities. Neuro: Alert Motor: RUE: 4+/5 proximal distal RLE: 4 -/5 proximal distal, improving LUE/LLE: 5/5 proximal and distal  Assessment/Plan: 1. Functional deficits secondary to thalamic stroke which require 3+ hours per day of interdisciplinary therapy in a comprehensive inpatient rehab setting.  Physiatrist is providing close team supervision and 24 hour management of active medical problems listed below.  Physiatrist and rehab team continue to assess barriers to discharge/monitor patient progress toward functional and medical goals  Care  Tool:  Bathing    Body parts bathed by patient: Right arm, Left arm, Chest, Abdomen, Front perineal area, Buttocks, Left lower leg, Face, Right lower leg, Left upper leg, Right upper leg         Bathing assist Assist Level: Supervision/Verbal cueing     Upper Body Dressing/Undressing Upper body dressing   What is the patient wearing?: Pull over shirt    Upper body assist Assist Level: Supervision/Verbal cueing    Lower Body Dressing/Undressing Lower body dressing      What is the patient wearing?: Pants, Underwear/pull up     Lower body assist Assist for lower body dressing: Supervision/Verbal cueing     Toileting Toileting    Toileting assist Assist for toileting: Supervision/Verbal cueing     Transfers Chair/bed transfer  Transfers assist     Chair/bed transfer assist level: Supervision/Verbal cueing     Locomotion Ambulation   Ambulation assist      Assist level: Supervision/Verbal cueing Assistive device: Walker-rolling Max distance: 150   Walk 10 feet activity   Assist     Assist level: Supervision/Verbal cueing Assistive device: Walker-rolling   Walk 50 feet activity   Assist    Assist level: Supervision/Verbal cueing Assistive device: Walker-rolling    Walk 150 feet activity   Assist    Assist level: Supervision/Verbal cueing Assistive device: Walker-rolling    Walk 10 feet on uneven surface  activity   Assist     Assist level: Minimal Assistance - Patient > 75% Assistive device: Aeronautical engineer Will patient use wheelchair at discharge?: No Type of Wheelchair: Manual    Wheelchair assist level: Supervision/Verbal cueing Max wheelchair  distance: 122ft    Wheelchair 50 feet with 2 turns activity    Assist        Assist Level: Supervision/Verbal cueing   Wheelchair 150 feet activity     Assist      Assist Level: Minimal Assistance - Patient > 75%   Blood pressure  137/86, pulse 70, temperature 98.2 F (36.8 C), temperature source Oral, resp. rate 17, height 5\' 6"  (1.676 m), weight 99.4 kg, SpO2 98 %.  Medical Problem List and Plan: 1.Right hemiparesis and functional deficitssecondary to left thalamic infarct  Continue CIR   -may have grounds pass on unit only 2. Antithrombotics: -DVT/anticoagulation:Pharmaceutical:Lovenox -antiplatelet therapy: DAPT X 3 weeks followed by ASA alone 3. Pain Management:encouraged continued use of heating pad for right cervical myofascial pain.   -prn tylenol- does not want Tramadol again as it made her feel "high"  Controlled on 9/13 4. Mood:LCSW to follow for evaluation and support. -antipsychotic agents: N/A 5. Neuropsych: This patientiscapable of making decisions on herown behalf. 6. Skin/Wound Care:Routine pressure relief measures. 7. Fluids/Electrolytes/Nutrition:Monitor I/Os.   8. T2DM with hyperglycemia: Hgb a1C- 11.6 and poorly controlled. Has not been on any meds for years.Continue Lantus insulin--willing to go home on this.Monitor BS ac/hs and continue to use SSI for tighter BS control as indicated. Consult dietician for dietary education. Pt wants to improve her control and diabetes awareness.  Lantus increased to 21   Tradjenta started 9/10  -9/13 continue current regimen as sugars well controlled 9. HTN: Monitor BP tid-denies history of hypertension.  Slightly elevated on 9/13, no meds at this point 10 Schizophrenia/Bipolar d/o/PTSD:Reports that it has been managed with psychotherapy. 11. OSA: Continue CPAP 12. Erythrocytosis:  Hemoglobin 15.4 on 9/8, continue to monitor 13. Dyslipidemia: Lipitor 14.  Transaminitis  LFTs elevated on 9/8, repeat labs this week  LOS: 6 days A FACE TO FACE EVALUATION WAS PERFORMED  Meredith Staggers 07/25/2020, 12:15 PM

## 2020-07-25 NOTE — Progress Notes (Signed)
Physical Therapy Session Note  Patient Details  Name: [name] [name] MRN: [id number] Date of Birth: [birth year]  Today's Date: 07/25/2020 PT Individual Time: 6967-8938 PT Individual Time Calculation (min): 73 min   Short Term Goals: Week 1:  PT Short Term Goal 1 (Week 1): =LTG due to ELOS  Skilled Therapeutic Interventions/Progress Updates:  Pt received in recliner & agreeable to tx. Sit<>stand throughout session with mod I, toilet transfer x 2 with mod I. Pt with 2 continent voids on toilet, performing peri hygiene & clothing management without assistance. Pt ambulates unit<>outside north tower with supervision without AD with good dorsiflexion & heel strike RLE, but some R hip flexion weakness during RLE swing phase. Pt negotiates ramp outside with supervision & is aware of occasional instances of poor RLE foot clearance. Discussed HHPT vs OPPT with PT recommending OPPT but pt unsure about transportation to/from appointments as she is the only one who drives & they do not have public transportation. Back in unit, pt completes floor transfer with supervision & PT reviewing fall recovery & when to call EMS. Pt performs 6" lateral step ups with focus on RLE hip flexion & overall strengthening & NMR as well as balance as pt progresses from BUE support>RUE support>no UE support with CGA. Pt engages in dancing to electric slide & cha cha slide with supervision/CGA with focus on dynamic movements/balance. Pt retrieves object from floor with supervision. At end of session pt left in recliner with chair alarm donned, call bell & all needs in reach.   Therapy Documentation Precautions:  Precautions Precautions: Fall Precaution Comments: right leg weakness, numbness, decreased coordination Restrictions Weight Bearing Restrictions: No  Pain: No c/o pain reported   Therapy/Group: Individual Therapy  [name] 07/25/2020, 12:30 PM

## 2020-07-25 NOTE — Progress Notes (Signed)
Patient ID: [name] [name], female   DOB: 1961/07/26, 59 y.o.   MRN: [id number]  SW received phone call from pt who reported that she was able to arrange transportation with a friend for outpatient therapies if needed.   Loralee Pacas, MSW, [place] Office: [phone] Cell: [phone] Fax: [phone]

## 2020-07-25 NOTE — Progress Notes (Signed)
Occupational Therapy Session Note  Patient Details  Name: Trudy Kory Lamora MRN: [id number] Date of Birth: [birth year]  Today's Date: 07/25/2020 OT Individual Time: 0800-0900   &   1300-1400 OT Individual Time Calculation (min): 60 min   &  60 min   Short Term Goals: Week 1:  OT Short Term Goal 1 (Week 1): STGs = LTGs  Skilled Therapeutic Interventions/Progress Updates:    AM session:   Patient seated in recliner, pleasant, cooperative, aware of needs, denies pain.  She requests a shower to start session this am.  She is able to ambulate in room without AD, obtain clothing and items needed for shower CS level.  She completed undressing CS, shower in stance with grab bars and hand held shower CS.  Dressing both seated and in stance with CS.  Footwear completed seated edge of bed mod I.  Grooming tasks completed in stance at sink with CS.  She demonstrates good safety and awareness with all adl tasks this am.   She is able to ambulate on unit to/from therapy gym without AD CS level.  Completed unsupported seated [place] activities with little difficulty.  Provided recommendations for ongoing HEP activities.  She completed standing balance and [place] activities with CS - no LOB.  Returned to recliner at close of session, call bell and tray table in reach.     PM session:   Patient seated in recliner, ready for afternoon session.    She ambulated to/from therapy gyms without AD CS.  Completed dynavision for balance and bilateral UE coordination:  2 minutes, whole screen in stance - trial 1 (left UE) = 1.11 sec, trial 2 (right UE) = 1.10 sec Completed bilateral UB wall slides for [facility] reach/stretch with good results - right UE equal to left.  Completed modified weight bearing bilateral UEs and hands/knees with forward reach, yoga poses/positions with CS/CGA - no LOB.  Standing scapular exercises with good posture and symmetry.  UB ergometer in stance 2 x 2.5 minutes.   Completed squat and pick up of cones from  floor surface, seated reach to floor for cones and progressed to 2x3 inch clothes pins with CGA.   Ambulation around unit and return to room at close of session with CS.  She sat in recliner at close of session, call bell in hand and needed items in reach.       Therapy Documentation Precautions:  Precautions Precautions: Fall Precaution Comments: right leg weakness, numbness, decreased coordination Restrictions Weight Bearing Restrictions: No   Therapy/Group: Individual Therapy  Carlos Levering 07/25/2020, 7:36 AM

## 2020-07-26 ENCOUNTER — Inpatient Hospital Stay (HOSPITAL_COMMUNITY): Payer: Medicare PPO | Admitting: Occupational Therapy

## 2020-07-26 ENCOUNTER — Inpatient Hospital Stay (HOSPITAL_COMMUNITY): Payer: Medicare PPO

## 2020-07-26 ENCOUNTER — Inpatient Hospital Stay (HOSPITAL_COMMUNITY): Payer: Medicare PPO | Admitting: Physical Therapy

## 2020-07-26 LAB — GLUCOSE, CAPILLARY
Glucose-Capillary: 109 mg/dL — ABNORMAL HIGH (ref 70–99)
Glucose-Capillary: 101 mg/dL — ABNORMAL HIGH (ref 70–99)
Glucose-Capillary: 114 mg/dL — ABNORMAL HIGH (ref 70–99)
Glucose-Capillary: 118 mg/dL — ABNORMAL HIGH (ref 70–99)

## 2020-07-26 IMAGING — CR DG HIP (WITH OR WITHOUT PELVIS) 2-3V*R*
3 series · 3 of 3 positions shown · non-contrast
Comparison: None.

CLINICAL DATA: Chronic right hip pain without recent injury.

EXAM:
DG HIP (WITH OR WITHOUT PELVIS) 2-3V RIGHT

[pelvis ap]
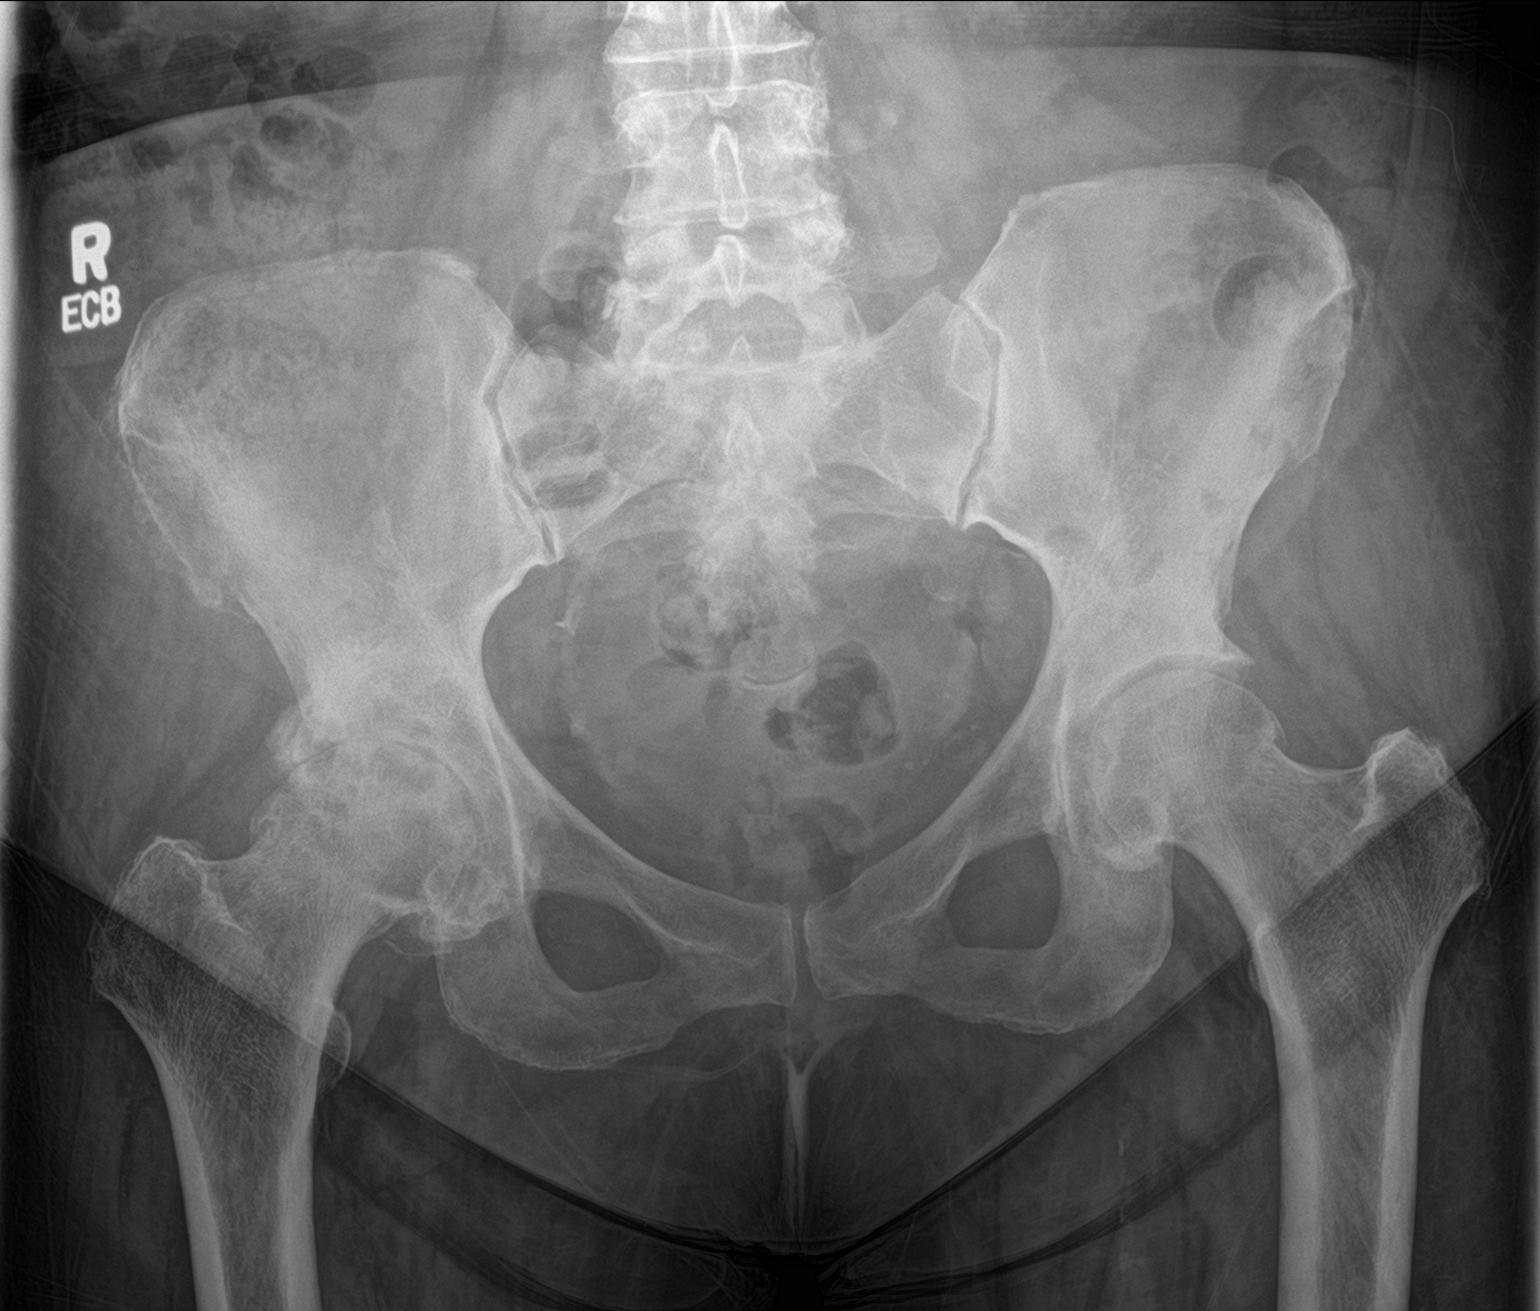

[hip ap]
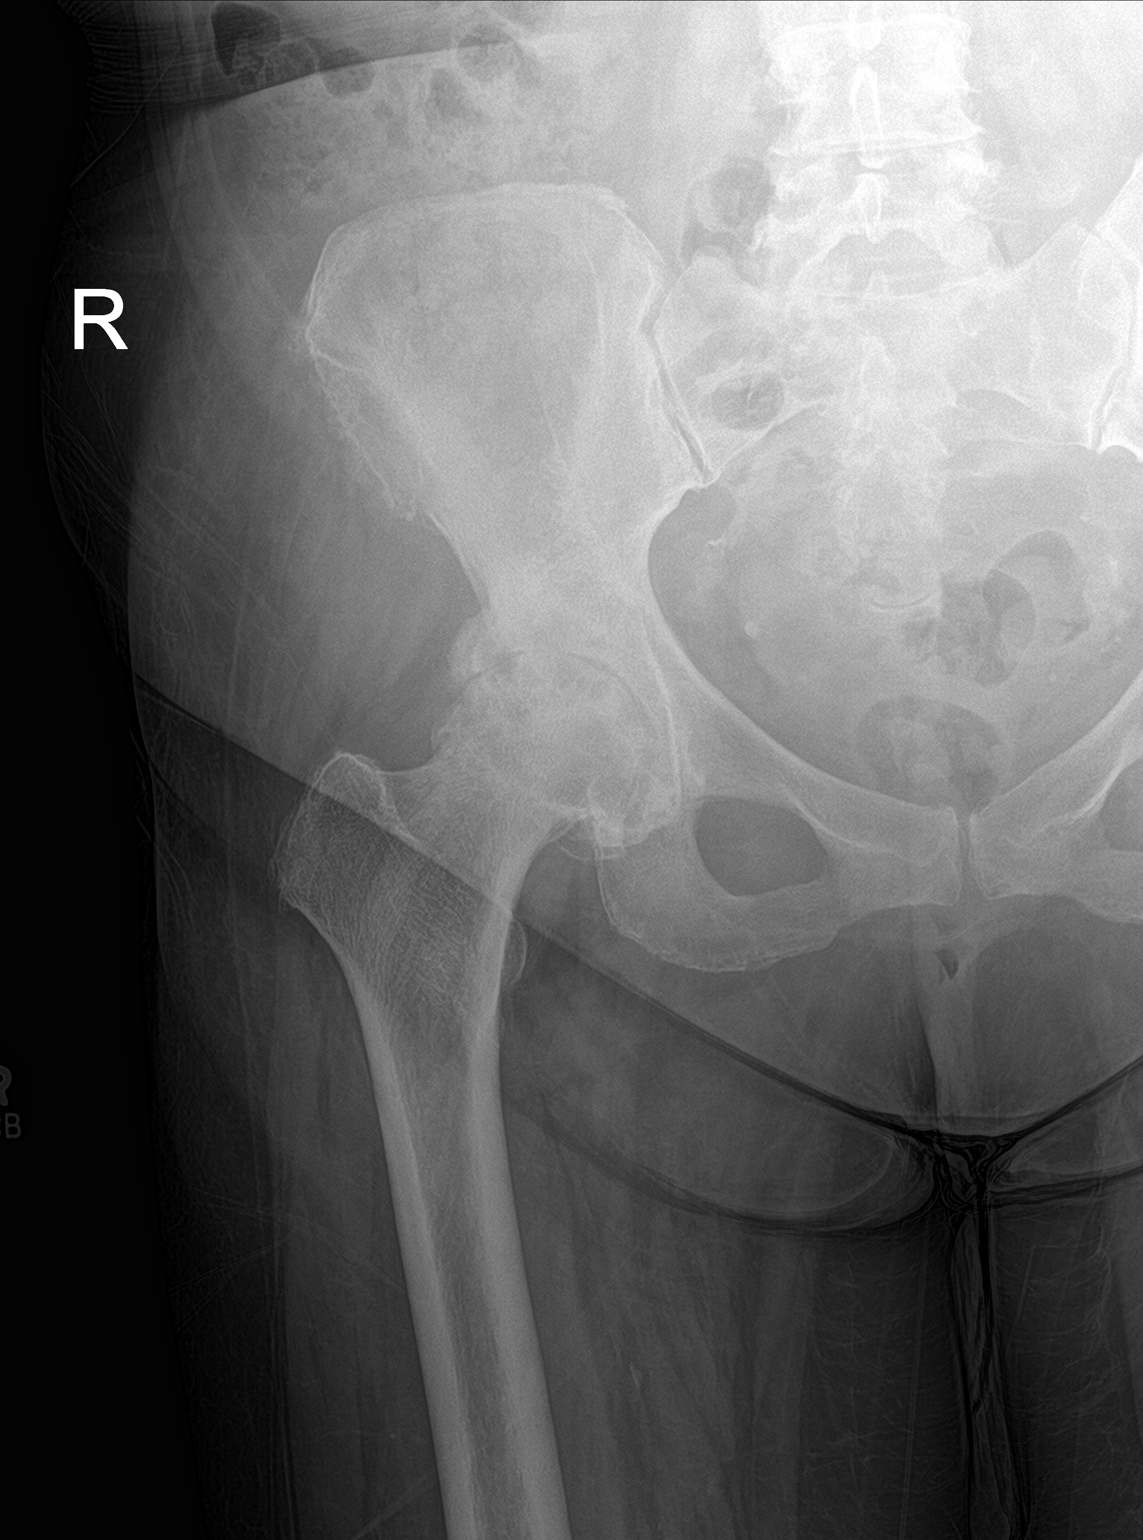

[hip lat]
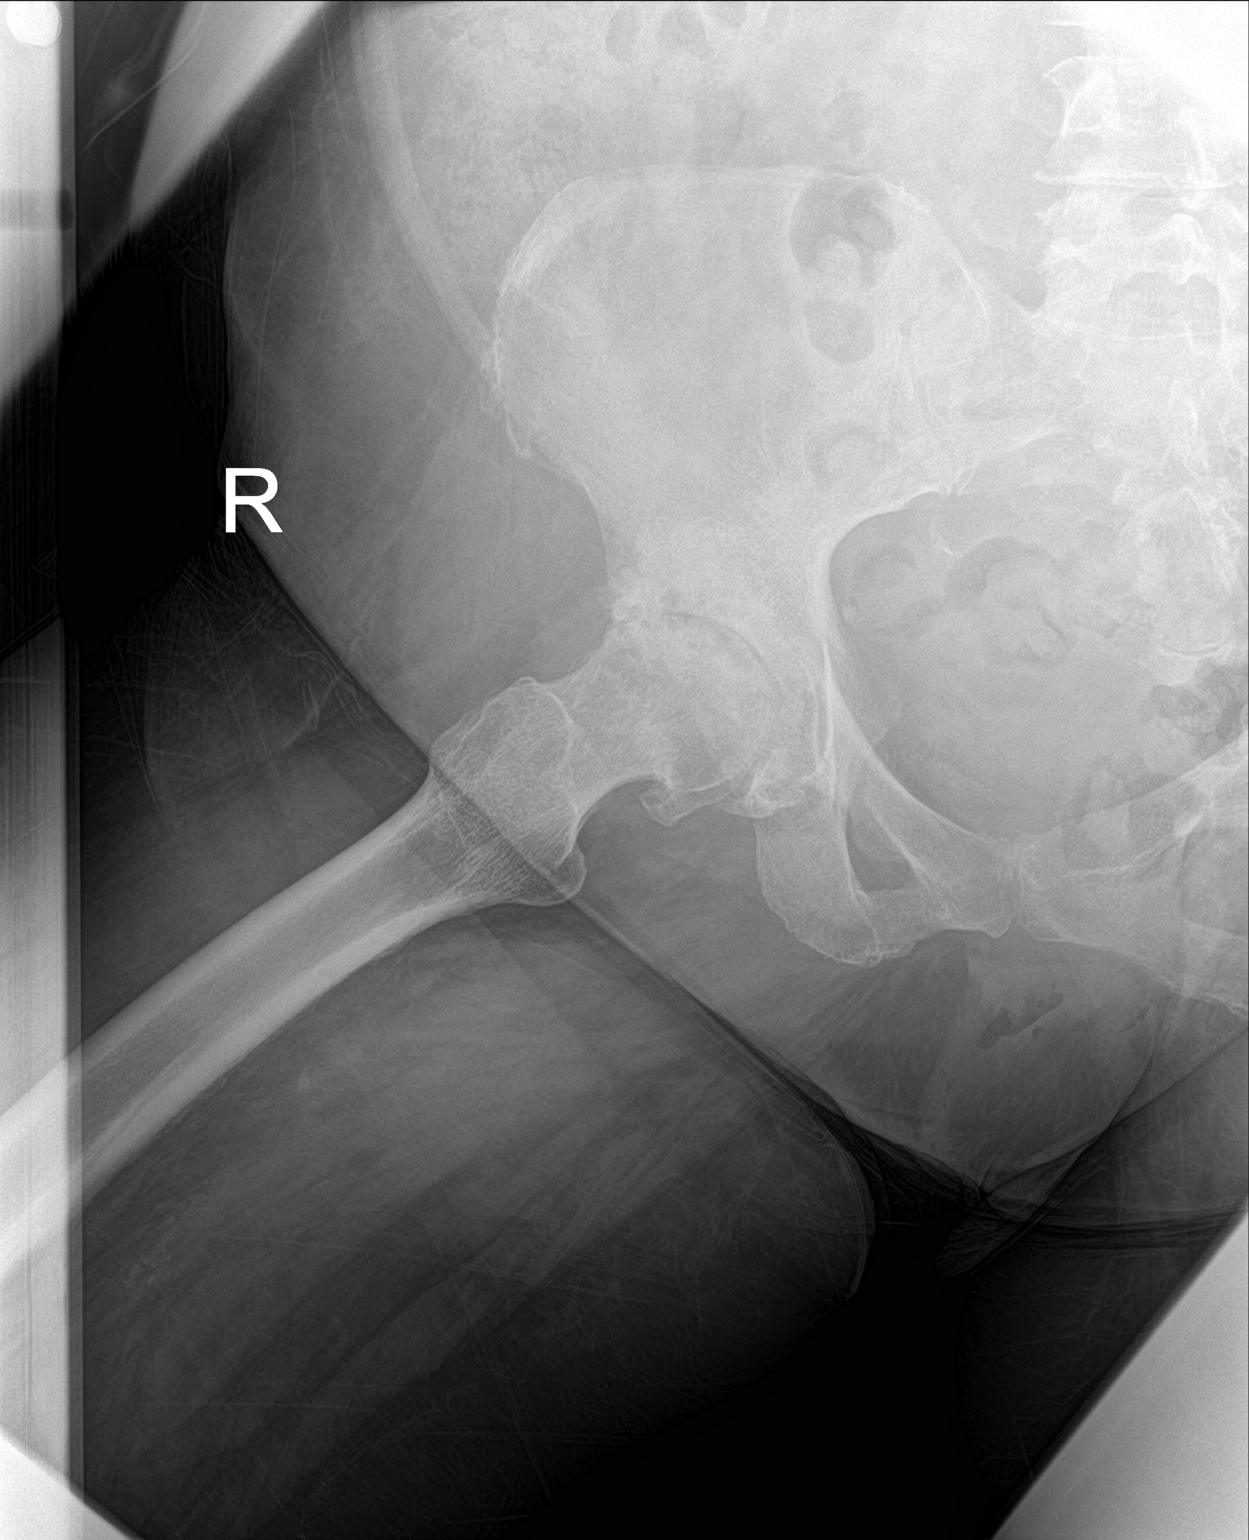

[3 of 3 positions shown; findings below may reference images not displayed]

FINDINGS: There is no evidence of hip fracture or dislocation. Severe
narrowing and osteophyte formation of the right hip joint is noted.
Sclerosis and lucency is seen in the superior portion of the right
femoral head suggesting avascular necrosis.
IMPRESSION: Severe osteoarthritis of the right hip. Probable avascular necrosis
of the right femoral head. No acute abnormality is noted.

## 2020-07-26 MED ORDER — ACETAMINOPHEN 325 MG PO TABS: 325.0000 mg | ORAL_TABLET | ORAL | Status: DC | PRN

## 2020-07-26 NOTE — Discharge Summary (Signed)
Physician Discharge Summary  Patient ID: [name] [name] MRN: [id number] DOB/AGE: [birth year] 59 y.o.  Admit date: 07/19/2020 Discharge date: 07/28/2020  Discharge Diagnoses:  Principal Problem:   Thalamic stroke [facility]) Active Problems:   Mixed hyperlipidemia   Transaminitis   Erythrocytosis   Uncontrolled type 2 diabetes mellitus with hyperglycemia, with long-term current use of insulin (HCC)   Osteoarthritis of hip   Discharged Condition: Stable  Significant Diagnostic Studies: DG HIP UNILAT WITH PELVIS 2-3 VIEWS RIGHT  Result Date: 07/26/2020 CLINICAL DATA:  Chronic right hip pain without recent injury. EXAM: DG HIP (WITH OR WITHOUT PELVIS) 2-3V RIGHT COMPARISON:  None. FINDINGS: There is no evidence of hip fracture or dislocation. Severe narrowing and osteophyte formation of the right hip joint is noted. Sclerosis and lucency is seen in the superior portion of the right femoral head suggesting avascular necrosis. IMPRESSION: Severe osteoarthritis of the right hip. 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 No acute abnormality is noted. Electronically Signed   By: Lupita Raider M.D.   On: 07/26/2020 14:53     Labs:  Basic Metabolic Panel: BMP Latest Ref Rng & Units 07/25/2020 07/20/2020 07/16/2020  Glucose 70 - 99 mg/dL 253(G) 644(I) 347(Q)  BUN 6 - 20 mg/dL 16 13 16   Creatinine 0.44 - 1.00 mg/dL 2.59 5.63 8.75  Sodium 135 - 145 mmol/L 137 137 136  Potassium 3.5 - 5.1 mmol/L 4.2 4.6 3.7  Chloride 98 - 111 mmol/L 105 105 105  CO2 22 - 32 mmol/L 20(L) 22 20(L)  Calcium 8.9 - 10.3 mg/dL 9.5 9.3 9.5    CBC: CBC Latest Ref Rng & Units 07/25/2020 07/20/2020 07/16/2020  WBC 4.0 - 10.5 K/uL 8.3 7.3 8.0  Hemoglobin 12.0 - 15.0 g/dL 15.7(H) 15.4(H) 16.0(H)  Hematocrit 36 - 46 % 49.0(H) 47.8(H) 48.4(H)  Platelets 150 - 400 K/uL 218 227 212    CBG: Recent Labs  Lab 07/27/20 1150 07/27/20 1636 07/27/20 2051 07/28/20 0611 07/28/20 1131  GLUCAP 102* 100* 116* 115* 85     Brief HPI:   [name] [name] is a 59 y.o. female with history of T2DM-diet controlled, HTN, fatty liver, schizoaffective disorder who was admitted on 07/12/2020 with headache, right-sided weakness and numbness as well as difficulty speaking.  UDS positive for THC.  CTA head/neck with perfusion was negative for LVO high-grade stenosis and patient out of window to receive TPA.  MRI brain showed small acute/early subacute left thalamic infarct.  Dr. Pearlean Brownie felt that stroke was due to small vessel disease and recommended DAPT x3 weeks followed by ASA alone.  She was placed in sleep smart study which was positive for OSA and CPAP added for use.  Therapy ongoing and patient was noted to have impairments in mobility and ADLs.  CIR recommended due to functional deficits.   Hospital Course: [name] [name] was admitted to rehab 07/19/2020 for inpatient therapies to consist of PT and OT at least three hours five days a week. Past admission physiatrist, therapy team and rehab RN have worked together to provide customized collaborative inpatient rehab.  Her blood pressures were monitored on TID basis and have been well controlled.  She was maintained on DAPT as to continue Plavix for an additional week.  She reported issues with right hip pain affecting endurance and overall mobility.  X-rays done revealed severe OA right hip question AVN of femoral head.  She is to follow-up in office for input/referral to Ortho after discharge.  Follow-up labs shows  abnormal LFTs are resolving.  Follow-up CBC shows persistent erythrocytosis and recommend repeat CBC in 2 to 3 weeks post discharge with work-up if still persistent.Diabetes has been monitored with ac/hs CBG checks and SSI was use prn for tighter BS control.  She has been educated on carb modified diet as well as insulin administration and is willing to continue Lantus until her A1c trends down.  Mood has been stable and she is continent of bowel and bladder.  She  has made good progress during his stay and is currently at modified independent level.  She will continue to receive outpatient PT and OT at [facility] neuro rehab after discharge   Rehab course: During patient's stay in rehab team conference was held to monitor patient's progress, set goals and discuss barriers to discharge. At admission, patient required CGA to supervision with ADL tasks and min assist with mobility. She  has had improvement in activity tolerance, balance, postural control as well as ability to compensate for deficits.  She is able to complete ADL tasks at modified independent level.  She is independent for mobility without use of assistive device.  Discharge disposition: 01-Home or Self Care  Diet: Heart healthy/diabetic restrictions.  Special Instructions: 1.  Recommend repeat CBC in 2 to 3 weeks to monitor H&H and work up as indicated.   2.  Monitor BS ac/hs and follow up with PCP for further adjustment.   Discharge Instructions    Ambulatory referral to Occupational Therapy   Complete by: As directed    Eval and treat   Ambulatory referral to Physical Medicine Rehab   Complete by: As directed    1-2 weeks TC appt   Ambulatory referral to Physical Therapy   Complete by: As directed    Eval and treat     Allergies as of 07/28/2020      Reactions   Citric Acid    Dairy Aid [lactase]    Metformin And Related    Nausea, diarrhea, malaise      Medication List    STOP taking these medications   glipiZIDE 5 MG tablet Commonly known as: GLUCOTROL   insulin aspart 100 UNIT/ML injection Commonly known as: novoLOG   insulin glargine 100 UNIT/ML injection Commonly known as: LANTUS Replaced by: insulin glargine 100 UNIT/ML Solostar Pen   PROBIOTIC DAILY PO   traZODone 50 MG tablet Commonly known as: DESYREL     TAKE these medications   acetaminophen 325 MG tablet Commonly known as: TYLENOL Take 1-2 tablets (325-650 mg total) by mouth every 4 (four) hours as  needed for mild pain.   Advocate Insulin Pen Needles 31G X 5 MM Misc Generic drug: Insulin Pen Needle 1 application by Does not apply route daily.   aspirin 81 MG EC tablet Take 1 tablet (81 mg total) by mouth daily. Swallow whole.   atorvastatin 80 MG tablet Commonly known as: LIPITOR Take 1 tablet (80 mg total) by mouth daily.   clopidogrel 75 MG tablet Commonly known as: PLAVIX Take 1 tablet (75 mg total) by mouth daily.   insulin glargine 100 UNIT/ML Solostar Pen Commonly known as: LANTUS Inject 21 Units into the skin daily. Replaces: insulin glargine 100 UNIT/ML injection   linagliptin 5 MG Tabs tablet Commonly known as: TRADJENTA Take 1 tablet (5 mg total) by mouth daily. Start taking on: July 29, 2020       Follow-up Information    Ranelle Oyster, MD Follow up.   Specialty: Physical Medicine and  Rehabilitation Why: Office will call you with follow up appointment Contact information: [address] [place] [address] [phone]        Dois Davenport, MD. Call.   Specialty: Family Medicine Why: for post hospital follow up. Contact information: [address] [place] [address] [phone]        [facility]. Call.   Why: for post stroke follow up Contact information: [address]     [address] [place] [address] [phone]              Signed: Jacquelynn Cree 07/28/2020, 4:21 PM

## 2020-07-26 NOTE — Progress Notes (Signed)
Occupational Therapy Discharge Summary  Patient Details  Name: [name] [name] MRN: [id number] Date of Birth: September 30, 1961     Patient has met 12 of 12 long term goals due to improved activity tolerance, improved balance, postural control, functional use of  RIGHT upper and RIGHT lower extremity and improved coordination.  Patient to discharge at overall Independent level.   Reasons goals not met: na  Recommendation:  Patient will benefit from ongoing skilled OT services in outpatient setting to continue to advance functional skills in the area of BADL and iADL and facilitate return to [facility] and leisure activities.    Equipment: No equipment provided  Reasons for discharge: treatment goals met  Patient/family agrees with progress made and goals achieved: Yes  OT Discharge Precautions/Restrictions  Precautions Precautions: None Restrictions Weight Bearing Restrictions: No ADL ADL Eating: Independent Where Assessed-Eating: Chair Grooming: Independent Where Assessed-Grooming: Standing at sink Upper Body Bathing: Independent Where Assessed-Upper Body Bathing: Shower Lower Body Bathing: Modified independent Where Assessed-Lower Body Bathing: Shower Upper Body Dressing: Independent Where Assessed-Upper Body Dressing: Chair Lower Body Dressing: Independent Where Assessed-Lower Body Dressing: Chair Toileting: Independent Where Assessed-Toileting: Glass blower/designer: Programmer, applications Method: Counselling psychologist: Raised Counselling psychologist: IT consultant Method: Heritage manager: Radio broadcast assistant, Grab bars ADL Comments: stands to shower Vision Baseline Vision/History: Wears glasses Wears Glasses: Reading only Patient Visual Report: No change from baseline Vision Assessment?: No apparent visual deficits Perception  Perception: Within Functional Limits Praxis Praxis:  Intact Cognition Overall Cognitive Status: Within Functional Limits for tasks assessed Arousal/Alertness: Awake/alert Orientation Level: Oriented X4 Sustained Attention: Appears intact Memory: Appears intact Awareness: Appears intact Problem Solving: Appears intact Safety/Judgment: Appears intact Sensation Sensation Additional Comments: occ numbness ulnar aspect of right hand - able to detect light touch and temperature Coordination Finger Nose Finger Test: [facility] 9 Hole Peg Test: L = 21 sec, R = 25 sec            box and blocks:  L = 64, R = 55 Motor  Motor Motor - Discharge Observations: generalized deconditioning, mild R hemiparesis Mobility  Bed Mobility Rolling Right: Independent Rolling Left: Independent Supine to Sit: Independent Sit to Supine: Independent Transfers Sit to Stand: Independent Stand to Sit: Independent  Trunk/Postural Assessment  Cervical Assessment Cervical Assessment: Within Functional Limits Thoracic Assessment Thoracic Assessment: Within Functional Limits Lumbar Assessment Lumbar Assessment: Within Functional Limits Postural Control Trunk Control: decreased in dynamic movement patterns due to weakness and numbness in R trunk  Balance Static Sitting Balance Static Sitting - Level of Assistance: 7: Independent Dynamic Sitting Balance Dynamic Sitting - Level of Assistance: 7: Independent Static Standing Balance Static Standing - Level of Assistance: 7: Independent Dynamic Standing Balance Dynamic Standing - Level of Assistance: 7: Independent Extremity/Trunk Assessment RUE Assessment General Strength Comments: full active ROM t/o, strength 4/5 LUE Assessment LUE Assessment: Within Functional Limits   [name] [name] 07/26/2020, 4:53 PM

## 2020-07-26 NOTE — Progress Notes (Signed)
Occupational Therapy Session Note  Patient Details  Name: [name] [name] MRN: [id number] Date of Birth: 05-24-1961  Today's Date: 07/26/2020 OT Individual Time: 0800-0900   &   1530-1555 OT Individual Time Calculation (min): 60 min   &   25 min Attempted to see patient at scheduled 1300 session, but going for xray at this time, will attempt to make up time later this afternoon.  Able to make up session after PT this afternoon.    Short Term Goals: Week 1:  OT Short Term Goal 1 (Week 1): STGs = LTGs  Skilled Therapeutic Interventions/Progress Updates:    AM session:   Patient seated in recliner, alert and ready for therapy session.  She denies pain, states that she is a little stiff from sitting and eager to do more walking today.  She is able to ambulate in room without AD, gather clothing and supplies for shower/adl with supervision.   Shower completed in stance with hand held shower and grab bars supervision level.  She is able to complete dressing seated on shower bench mod I.  Grooming tasks in stance at sink CS.   She ambulates with CS to/from therapy gym.  Completed standing LB stretching activities, UB ergometer in stance for 10 minutes, dowel vs ball toss in stance.  Provided pencil and reviewed [facility] HEP with shading/coloring task.  Provided ice pack for right hip.  She sat in recliner at close of session, call bell and tray table in reach.     PM session:   Patient finishing PT and agrees to make up time missed earlier today due to xray.  She denies pain.  Reviewed HEP recommendations that she demonstrates good carryover of.  She is able to walk on unit to/from therapy gym CS.  Completed hand dexterity reassessments for upcoming discharge.  Reviewed safety and transition to home with good carryover and understanding demonstrated.  She returned to room, seated in recliner with call bell and tray table in reach.      Therapy Documentation Precautions:  Precautions Precautions:  Fall Precaution Comments: right leg weakness, numbness, decreased coordination Restrictions Weight Bearing Restrictions: No   Therapy/Group: Individual Therapy  Carlos Levering 07/26/2020, 7:29 AM

## 2020-07-26 NOTE — Progress Notes (Signed)
Patient ID: [name] [name], female   DOB: 16-Aug-1961, 59 y.o.   MRN: [id number]  SW met with pt to provide updates from team conference, and d/c date 9/16. Outpatient Therapy referral will be sent to [facility] for PT/OT.   Loralee Pacas, MSW, [place] Office: [phone] Cell: [phone] Fax: [phone]

## 2020-07-26 NOTE — Patient Care Conference (Signed)
Inpatient RehabilitationTeam Conference and Plan of Care Update Date: 07/26/2020   Time: 10:48 AM    Patient Name: [name] [name]      Medical Record Number: [id number]  Date of Birth: 1961-05-19 Sex: Female         Room/Bed: 4W22C/4W22C-01 Payor Info: Payor: [employer] MEDICARE / Plan: [employer] MEDICARE CHOICE PPO / Product Type: *No Product type* /    Admit Date/Time:  07/19/2020  4:29 PM  Primary Diagnosis:  Thalamic stroke [facility])  Hospital Problems: Principal Problem:   Thalamic stroke (HCC) Active Problems:   Transaminitis   Erythrocytosis   Labile blood pressure   Uncontrolled type 2 diabetes mellitus with hyperglycemia, with long-term current use of insulin (HCC)   Elevated blood pressure reading    Expected Discharge Date: Expected Discharge Date: 07/28/20  Team Members Present: Physician leading conference: Dr. Faith Rogue Care Coodinator Present: Cecile Sheerer, LCSWA;Silas Sedam Marlyne Beards, RN, BSN, CRRN Nurse Present: Margot Ables, LPN PT Present: Aleda Grana, PT OT Present: Towanda Malkin, OT PPS Coordinator present : Edson Snowball, Park Breed, SLP     Current Status/Progress Goal Weekly Team Focus  Bowel/Bladder   pt cont of b and b  remain cont of b and b  Assess q shift and prn    Swallow/Nutrition/ Hydration             ADL's   CS for all self care in stance to include reach and transport of items needed  mod I  dynamic balance, basic HM, general conditioning, R NMRE   Mobility   supervision overall with RW, CGA stairs with rails  mod I with LRAD  transfers, gait, balance, enduranc,e NMR, pt education   Communication             Safety/Cognition/ Behavioral Observations            Pain   pt has no current complaits of pain  remain pain free  Assess q shift and prn    Skin   no current skin break down   Stay free of skin breakdown and infection  Assess q shift and prn      Discharge Planning:  Pt will need to be her own primary  cargeiver at time of d/c. Pt husband is older (in 22s) and is legally blind. He is not physically able to assist her. She states they will use uber for transportation and have groceries delivered to home. Pt has a friend who can assist PRN to appointments/grocery shopping etc. Pt states her dtr in [place] has offered her and husband to move into their home if needed.   Team Discussion: Continent B/B, Pain and soreness controlled with Tylenol. PT goals are Supervision progressing to Mod I. OT standing in shower with Mod I goals.  Patient on target to meet rehab goals: yes  *See Care Plan and progress notes for long and short-term goals.   Revisions to Treatment Plan:  None at this time.  Teaching Needs: Continue with family education.  Current Barriers to Discharge: Medication compliance  Possible Resolutions to Barriers: Continue education on medication management.     Medical Summary Current Status: left thalamic infarct. right hemiparesis and hemisensory loss. right hip pain, chronic. addressing CBG control.  Barriers to Discharge: Medical stability   Possible Resolutions to [employer] Focus: ongoing med mgt and finalization of dc medical plan.   Continued Need for Acute Rehabilitation Level of Care: The patient requires daily medical management by a physician with specialized training in  physical medicine and rehabilitation for the following reasons: Direction of a multidisciplinary physical rehabilitation program to maximize functional independence : Yes Medical management of patient stability for increased activity during participation in an intensive rehabilitation regime.: Yes Analysis of laboratory values and/or radiology reports with any subsequent need for medication adjustment and/or medical intervention. : Yes   I attest that I was present, lead the team conference, and concur with the assessment and plan of the team.   Tennis Must 07/26/2020, 2:26 PM

## 2020-07-26 NOTE — Progress Notes (Signed)
Physical Therapy Discharge Summary  Patient Details  Name: [name] [name] MRN: [id number] Date of Birth: [birth year]  Today's Date: 07/27/2020   Patient has met 6 of 6 long term goals due to improved activity tolerance, improved balance, improved postural control, increased strength, ability to compensate for deficits, functional use of  right upper extremity and right lower extremity, improved attention, improved awareness and improved coordination.  Patient to discharge at an ambulatory level mod I without AD.     Reasons goals not met: n/a  Recommendation:  Patient will benefit from ongoing skilled PT services in outpatient setting to continue to advance safe functional mobility, address ongoing impairments in R UE/LE NMR, balance, endurance, and minimize fall risk.  Equipment: No equipment provided  Reasons for discharge: treatment goals met and discharge from hospital  Patient/family agrees with progress made and goals achieved: Yes  PT Discharge Precautions/Restrictions Precautions Precautions: None Restrictions Weight Bearing Restrictions: No  Vision/Perception  Pt wears glasses for reading only at baseline.  Perception WNL. Praxis intact.  Cognition Overall Cognitive Status: Within Functional Limits for tasks assessed Arousal/Alertness: Awake/alert Orientation Level: Oriented X4 Focused Attention: Appears intact Sustained Attention: Appears intact Memory: Appears intact Awareness: Appears intact Problem Solving: Appears intact Safety/Judgment: Appears intact  Sensation     Motor  Motor Motor - Discharge Observations: generalized deconditioning, mild R hemiparesis   Mobility Bed Mobility Bed Mobility: Rolling Right;Rolling Left;Supine to Sit;Sit to Supine Rolling Right: Independent Rolling Left: Independent Supine to Sit: Independent Sit to Supine: Independent Transfers Transfers: Sit to Stand;Stand to Sit Sit to Stand: Independent Stand to Sit:  Independent Transfer (Assistive device): None  Locomotion  Gait Ambulation: Yes Gait Assistance: Independent Gait Distance (Feet): 150 Feet Assistive device: None Gait Gait: Yes Gait Pattern: Impaired Gait Pattern: Decreased stance time - right;Decreased hip/knee flexion - right Gait velocity: decreased Stairs / Additional Locomotion Stairs: Yes Stairs Assistance: Independent with assistive device Stair Management Technique: Two rails;Alternating pattern Number of Stairs: 12 Height of Stairs: 6 Ramp: Independent (ambulatory, no AD) Curb: Independent Corporate treasurer: No   Trunk/Postural Assessment  Postural Control Postural Control: Deficits on evaluation Trunk Control: decreased in dynamic movement patterns due to weakness and numbness in R trunk   Balance Balance Balance Assessed: Yes Standardized Balance Assessment Standardized Balance Assessment: Berg Balance Test Berg Balance Test Sit to Stand: Able to stand without using hands and stabilize independently Standing Unsupported: Able to stand safely 2 minutes Sitting with Back Unsupported but Feet Supported on Floor or Stool: Able to sit safely and securely 2 minutes Stand to Sit: Sits safely with minimal use of hands Transfers: Able to transfer safely, minor use of hands Standing Unsupported with Eyes Closed: Able to stand 10 seconds safely Standing Ubsupported with Feet Together: Able to place feet together independently and stand 1 minute safely From Standing, Reach Forward with Outstretched Arm: Can reach confidently >25 cm (10") From Standing Position, Pick up Object from Floor: Able to pick up shoe safely and easily From Standing Position, Turn to Look Behind Over each Shoulder: Looks behind from both sides and weight shifts well Turn 360 Degrees: Able to turn 360 degrees safely in 4 seconds or less Standing Unsupported, Alternately Place Feet on Step/Stool: Able to complete 4 steps  without aid or supervision Standing Unsupported, One Foot in Front: Able to plae foot ahead of the other independently and hold 30 seconds Standing on One Leg: Able to lift leg independently and hold > 10 seconds Total  Score: 53  Extremity Assessment  RUE Assessment Passive Range of Motion (PROM) Comments: WFL Active Range of Motion (AROM) Comments: WFL General Strength Comments: pt reports weakness with 2nd/3rd digit opposition with thumb LUE Assessment LUE Assessment: Within Functional Limits  RLE Assessment Active Range of Motion (AROM) Comments: WFL General Strength Comments: limited ability to test 2/2 R hip pain LLE Assessment LLE Assessment: Within Functional Limits    Sandi Mariscal 07/27/2020, 2:00 PM

## 2020-07-26 NOTE — Progress Notes (Addendum)
[place] PHYSICAL MEDICINE & REHABILITATION PROGRESS NOTE   Subjective/Complaints: Continues to progress. Was upset that she had labs Monday morning which were not announced by me or the covering md. Having persistent right hip pain esp with wb.  ROS: Patient denies fever, rash, sore throat, blurred vision, nausea, vomiting, diarrhea, cough, shortness of breath or chest pain, joint or back pain, headache, or mood change.   Objective:   No results found. Recent Labs    07/25/20 1831  WBC 8.3  HGB 15.7*  HCT 49.0*  PLT 218   Recent Labs    07/25/20 1831  NA 137  K 4.2  CL 105  CO2 20*  GLUCOSE 140*  BUN 16  CREATININE 0.70  CALCIUM 9.5    Intake/Output Summary (Last 24 hours) at 07/26/2020 1222 Last data filed at 07/26/2020 0700 Gross per 24 hour  Intake 900 ml  Output --  Net 900 ml     Physical Exam: Vital Signs Blood pressure (!) 146/66, pulse 68, temperature 97.9 F (36.6 C), resp. rate 18, height 5\' 6"  (1.676 m), weight 99.4 kg, SpO2 100 %.  Constitutional: No distress . Vital signs reviewed. obese HEENT: EOMI, oral membranes moist Neck: supple Cardiovascular: RRR without murmur. No JVD    Respiratory/Chest: CTA Bilaterally without wheezes or rales. Normal effort    GI/Abdomen: BS +, non-tender, non-distended Ext: no clubbing, cyanosis, or edema Psych: pleasant and cooperative Musc: No edema in extremities.  No tenderness in extremities. Neuro: Alert Motor: RUE: 4+/5 proximal distal RLE: 4/5 proximal distal, improving, still some issues with dexterity LUE/LLE: 5/5 proximal and distal  Assessment/Plan: 1. Functional deficits secondary to thalamic stroke which require 3+ hours per day of interdisciplinary therapy in a comprehensive inpatient rehab setting.  Physiatrist is providing close team supervision and 24 hour management of active medical problems listed below.  Physiatrist and rehab team continue to assess barriers to discharge/monitor patient  progress toward functional and medical goals  Care Tool:  Bathing    Body parts bathed by patient: Right arm, Left arm, Chest, Abdomen, Front perineal area, Buttocks, Left lower leg, Face, Right lower leg, Left upper leg, Right upper leg         Bathing assist Assist Level: Supervision/Verbal cueing     Upper Body Dressing/Undressing Upper body dressing   What is the patient wearing?: Pull over shirt    Upper body assist Assist Level: Supervision/Verbal cueing    Lower Body Dressing/Undressing Lower body dressing      What is the patient wearing?: Underwear/pull up, Pants     Lower body assist Assist for lower body dressing: Supervision/Verbal cueing     Toileting Toileting    Toileting assist Assist for toileting: Supervision/Verbal cueing     Transfers Chair/bed transfer  Transfers assist     Chair/bed transfer assist level: Supervision/Verbal cueing     Locomotion Ambulation   Ambulation assist      Assist level: Supervision/Verbal cueing Assistive device: No Device Max distance: 150 ft   Walk 10 feet activity   Assist     Assist level: Supervision/Verbal cueing Assistive device: No Device   Walk 50 feet activity   Assist    Assist level: Supervision/Verbal cueing Assistive device: No Device    Walk 150 feet activity   Assist    Assist level: Supervision/Verbal cueing Assistive device: No Device    Walk 10 feet on uneven surface  activity   Assist     Assist level: Supervision/Verbal cueing  Assistive device:  (none)   Wheelchair     Assist Will patient use wheelchair at discharge?: No Type of Wheelchair: Manual    Wheelchair assist level: Supervision/Verbal cueing Max wheelchair distance: 163ft    Wheelchair 50 feet with 2 turns activity    Assist        Assist Level: Supervision/Verbal cueing   Wheelchair 150 feet activity     Assist      Assist Level: Minimal Assistance - Patient > 75%    Blood pressure (!) 146/66, pulse 68, temperature 97.9 F (36.6 C), resp. rate 18, height 5\' 6"  (1.676 m), weight 99.4 kg, SpO2 100 %.  Medical Problem List and Plan: 1.Right hemiparesis and functional deficitssecondary to left thalamic infarct  Continue CIR---dc 9/16---> mod I goals   -may have grounds pass on unit only 2. Antithrombotics: -DVT/anticoagulation:Pharmaceutical:Lovenox -antiplatelet therapy: DAPT X 3 weeks followed by ASA alone 3. Pain Management:encouraged continued use of heating pad for right cervical myofascial pain.   -prn tylenol-   9/14 will check xrays to investigate right hip pain. 4. Mood:LCSW to follow for evaluation and support. -antipsychotic agents: N/A 5. Neuropsych: This patientiscapable of making decisions on herown behalf. 6. Skin/Wound Care:Routine pressure relief measures. 7. Fluids/Electrolytes/Nutrition:Monitor I/Os.   8. T2DM with hyperglycemia: Hgb a1C- 11.6 and poorly controlled. Has not been on any meds for years.Continue Lantus insulin--willing to go home on this.Monitor BS ac/hs and continue to use SSI for tighter BS control as indicated. Consult dietician for dietary education. Pt wants to improve her control and diabetes awareness.  Lantus increased to 21   Tradjenta started 9/10  -9/13 will continue current regimen as sugars well controlled 9. HTN: Monitor BP tid-denies history of hypertension.  Slightly elevated on 9/14  10 Schizophrenia/Bipolar d/o/PTSD:Reports that it has been managed with psychotherapy. 11. OSA: Continue CPAP 12. Erythrocytosis:  Hemoglobin 15.4 on 9/8, continue to monitor 13. Dyslipidemia: Lipitor 14.  Transaminitis  LFTs elevated on 9/8, repeat labs demonstrating improvement yesterday  LOS: 7 days A FACE TO FACE EVALUATION WAS PERFORMED  Meredith Staggers 07/26/2020, 12:22 PM

## 2020-07-26 NOTE — Progress Notes (Signed)
Physical Therapy Session Note  Patient Details  Name: [name] [name] MRN: [id number] Date of Birth: 1961-08-06  Today's Date: 07/26/2020 PT Individual Time: 1445-1530 PT Individual Time Calculation (min): 45 min   Short Term Goals: Week 1:  PT Short Term Goal 1 (Week 1): =LTG due to ELOS Week 2:    Week 3:     Skilled Therapeutic Interventions/Progress Updates:    PAIN pt c/o R hip pain which she states is premorbid and stems from 2xfalls in past, states mild and "more of a stiffness" that improves as she moves.   Rest provided as needed but mobility primarily limited by fatigue.  Pt seen this pm for session w/focus on high level functional gait, high level balance. STS w/supervision and gait >134ft w/supervision and mild deviations RLE w/slight decreased hip/knee flexion but clears at preferred gait speed.  Does appear w/some "stiffness" on R initially which improves over distance and pt attributes to hip.  Multiple gait trials in outdoor environment, path >452ft including: Stairs with and without rails  Inclines/declines Ramps and curbs Uneven sidewalks Uneven brick patio Grassy surface w/incline and decline  Pt performs all of above w/supervision only at preferred gait speed.  Slowed on grassy surface w/good safety awarness demonstrated.  Repeated path x 2 w/seated rest x 3 min, discussed application to home environment/safety.  Gait trials on indoor/level surface w/supervision and including sudden starts and stops, pivot turns, backing, variations in gait speed inclduing preferred/quick/slow pace.  Performs all w/supervision w/exception of sudden stops which 50%time require cga due to slight delay RLE and/or decreased clearance.  Also demonstrates mild decreased clearance on R at end of session w/fatigue.  Repeated above x 3 w/distances 180-243ft and seated rest between efforts.  Dynamic balance including tandemwalk, reverse tandemwalk, heel walk, toe walk (decreased control  R PF), and sidestepping performed cga only w/reverse tandem.  Pt left oob in recliner w/needs in reach.  Therapy Documentation Precautions:  Precautions Precautions: None Precaution Comments: right leg weakness, numbness, decreased coordination Restrictions Weight Bearing Restrictions: No    Therapy/Group: Individual Therapy  Callie Fielding, [place] 07/26/2020, 4:00 PM

## 2020-07-26 NOTE — Progress Notes (Signed)
Physical Therapy Session Note  Patient Details  Name: [name] [name] MRN: [id number] Date of Birth: 09/19/1961  Today's Date: 07/26/2020 PT Individual Time: 2111-5520 PT Individual Time Calculation (min): 55 min   Short Term Goals: Week 1:  PT Short Term Goal 1 (Week 1): =LTG due to ELOS  Skilled Therapeutic Interventions/Progress Updates:  Pt received in recliner & agreeable to tx. Sit<>stand with mod I, gait in room without AD & supervision. Pt completes toileting with mod I with continent void & hand hygiene standing at sink with mod I. Gait around unit without AD with supervision with focus on fluidity of movement & pt with decreased hip flexion RLE during swing phase. Pt negotiates 12 steps (6") with B rails with reciprocal pattern with distant supervision. Provided pt with OTAGO level C exercises & pt completes them with instructional cuing from PT & education re: need to stand close by/hold to stable support. Pt ambulates gym>room with focus on "high knees" for RLE hip flexion strengthening, dynamic balance, weight shifting L<>R, and balance during single leg stance. At end of session pt left in recliner with chair alarm donned, call bell & all needs in reach.  Therapy Documentation Precautions:  Precautions Precautions: Fall Precaution Comments: right leg weakness, numbness, decreased coordination Restrictions Weight Bearing Restrictions: No  Pain: Pt c/o "right leg bothering me" - pt with ice on hip at beginning of session, rest breaks provided PRN  Therapy/Group: Individual Therapy  [name] 07/26/2020, 10:39 AM

## 2020-07-27 ENCOUNTER — Inpatient Hospital Stay (HOSPITAL_COMMUNITY): Payer: Medicare PPO | Admitting: Occupational Therapy

## 2020-07-27 ENCOUNTER — Inpatient Hospital Stay (HOSPITAL_COMMUNITY): Payer: Medicare PPO | Admitting: Physical Therapy

## 2020-07-27 LAB — GLUCOSE, CAPILLARY
Glucose-Capillary: 109 mg/dL — ABNORMAL HIGH (ref 70–99)
Glucose-Capillary: 102 mg/dL — ABNORMAL HIGH (ref 70–99)
Glucose-Capillary: 100 mg/dL — ABNORMAL HIGH (ref 70–99)
Glucose-Capillary: 116 mg/dL — ABNORMAL HIGH (ref 70–99)

## 2020-07-27 MED ORDER — ACETAMINOPHEN 500 MG PO TABS
1000.0000 mg | ORAL_TABLET | Freq: Three times a day (TID) | ORAL | Status: DC | PRN
  Administered 2020-07-27 – 2020-07-28 (×2): 1000 mg via ORAL
  Filled 2020-07-27 (×2): qty 2

## 2020-07-27 NOTE — Progress Notes (Signed)
Occupational Therapy Session Note  Patient Details  Name: [name] [name] MRN: [id number] Date of Birth: [birth year]  Today's Date: 07/27/2020 OT Individual Time: 1000-1100 OT Individual Time Calculation (min): 60 min    Short Term Goals: Week 1:  OT Short Term Goal 1 (Week 1): STGs = LTGs  Skilled Therapeutic Interventions/Progress Updates:    Pt received in recliner stating she was feeling extremely tired and was so tired that "I cant even get my clothes off!"  Discussed how well she did taking a shower yesterday and how today she was evaluated by PT to be mod I in the room.  Pt feels very confident she can shower and dress independently.  It was not necessary to go through all those steps today as she was already dressed for the PT session.  Pt requested to do something very low key.  To continue to work on her [facility] and visual scanning skills, pt worked on puzzles using the PVC pipes and did very well with them.  Then worked on [facility] with using small beads and stringing them.  No difficulty with the task.  Pt resting in recliner and she stated she was going to try to nap.   Therapy Documentation Precautions:  Precautions Precautions: None Precaution Comments: right leg weakness, numbness, decreased coordination Restrictions Weight Bearing Restrictions: No   Pain: Pain Assessment Pain Scale: 0-10 Pain Score: 0-No pain ADL: ADL Eating: Independent Where Assessed-Eating: Chair Grooming: Independent Where Assessed-Grooming: Standing at sink Upper Body Bathing: Independent Where Assessed-Upper Body Bathing: Shower Lower Body Bathing: Modified independent Where Assessed-Lower Body Bathing: Shower Upper Body Dressing: Independent Where Assessed-Upper Body Dressing: Chair Lower Body Dressing: Independent Where Assessed-Lower Body Dressing: Chair Toileting: Independent Where Assessed-Toileting: Glass blower/designer: Programmer, applications Method: Air traffic controller: Raised Agricultural consultant Transfer: IT consultant Method: Heritage manager: Radio broadcast assistant, Grab bars ADL Comments: stands to shower   Therapy/Group: Individual Therapy  [place] 07/27/2020, 10:50 AM

## 2020-07-27 NOTE — Progress Notes (Signed)
Patient ID: [name] [name], female   DOB: 06-Jul-1961, 59 y.o.   MRN: [id number]  SW faxed outpatient PT/OT referral to [facility] ([phone]/f:[phone]).  Loralee Pacas, MSW, [place] Office: [phone] Cell: [phone] Fax: [phone]

## 2020-07-27 NOTE — Progress Notes (Signed)
[place] PHYSICAL MEDICINE & REHABILITATION PROGRESS NOTE   Subjective/Complaints: Pt in good spirits. Denies any new issues. Right hip still painful. Had used ibuprofen at home before to help control it.   ROS: Patient denies fever, rash, sore throat, blurred vision, nausea, vomiting, diarrhea, cough, shortness of breath or chest pain,   headache, or mood change.   Objective:   DG HIP UNILAT WITH PELVIS 2-3 VIEWS RIGHT  Result Date: 07/26/2020 CLINICAL DATA:  Chronic right hip pain without recent injury. EXAM: DG HIP (WITH OR WITHOUT PELVIS) 2-3V RIGHT COMPARISON:  None. FINDINGS: There is no evidence of hip fracture or dislocation. Severe narrowing and osteophyte formation of the right hip joint is noted. Sclerosis and lucency is seen in the superior portion of the right femoral head suggesting avascular necrosis. IMPRESSION: Severe osteoarthritis of the right hip. 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 No acute abnormality is noted. Electronically Signed   By: Marijo Conception M.D.   On: 07/26/2020 14:53   Recent Labs    07/25/20 1831  WBC 8.3  HGB 15.7*  HCT 49.0*  PLT 218   Recent Labs    07/25/20 1831  NA 137  K 4.2  CL 105  CO2 20*  GLUCOSE 140*  BUN 16  CREATININE 0.70  CALCIUM 9.5    Intake/Output Summary (Last 24 hours) at 07/27/2020 0853 Last data filed at 07/27/2020 0739 Gross per 24 hour  Intake 1200 ml  Output --  Net 1200 ml     Physical Exam: Vital Signs Blood pressure 114/65, pulse 70, temperature 98.6 F (37 C), resp. rate 14, height 5\' 6"  (1.676 m), weight 99.4 kg, SpO2 98 %.  Constitutional: No distress . Vital signs reviewed. HEENT: EOMI, oral membranes moist Neck: supple Cardiovascular: RRR without murmur. No JVD    Respiratory/Chest: CTA Bilaterally without wheezes or rales. Normal effort    GI/Abdomen: BS +, non-tender, non-distended Ext: no clubbing, cyanosis, or edema Psych: pleasant and cooperative Musc: No edema in  extremities. Right groin tenderness with radiation to buttock Neuro: Alert Motor: RUE: 4+/5 proximal distal RLE: 4/5 proximal distal, still lacks fmc LUE/LLE: 5/5 proximal and distal  Assessment/Plan: 1. Functional deficits secondary to thalamic stroke which require 3+ hours per day of interdisciplinary therapy in a comprehensive inpatient rehab setting.  Physiatrist is providing close team supervision and 24 hour management of active medical problems listed below.  Physiatrist and rehab team continue to assess barriers to discharge/monitor patient progress toward functional and medical goals  Care Tool:  Bathing    Body parts bathed by patient: Right arm, Left arm, Chest, Abdomen, Front perineal area, Buttocks, Left lower leg, Face, Right lower leg, Left upper leg, Right upper leg         Bathing assist Assist Level: Supervision/Verbal cueing     Upper Body Dressing/Undressing Upper body dressing   What is the patient wearing?: Pull over shirt    Upper body assist Assist Level: Supervision/Verbal cueing    Lower Body Dressing/Undressing Lower body dressing      What is the patient wearing?: Underwear/pull up, Pants     Lower body assist Assist for lower body dressing: Supervision/Verbal cueing     Toileting Toileting    Toileting assist Assist for toileting: Supervision/Verbal cueing     Transfers Chair/bed transfer  Transfers assist     Chair/bed transfer assist level: Supervision/Verbal cueing     Locomotion Ambulation   Ambulation assist  Assist level: Supervision/Verbal cueing Assistive device: No Device Max distance: 220   Walk 10 feet activity   Assist     Assist level: Supervision/Verbal cueing Assistive device: No Device   Walk 50 feet activity   Assist    Assist level: Supervision/Verbal cueing Assistive device: No Device    Walk 150 feet activity   Assist    Assist level: Supervision/Verbal cueing Assistive  device: No Device    Walk 10 feet on uneven surface  activity   Assist     Assist level: Supervision/Verbal cueing Assistive device:  (none)   Wheelchair     Assist Will patient use wheelchair at discharge?: No Type of Wheelchair: Manual    Wheelchair assist level: Supervision/Verbal cueing Max wheelchair distance: 135ft    Wheelchair 50 feet with 2 turns activity    Assist        Assist Level: Supervision/Verbal cueing   Wheelchair 150 feet activity     Assist      Assist Level: Minimal Assistance - Patient > 75%   Blood pressure 114/65, pulse 70, temperature 98.6 F (37 C), resp. rate 14, height 5\' 6"  (1.676 m), weight 99.4 kg, SpO2 98 %.  Medical Problem List and Plan: 1.Right hemiparesis and functional deficitssecondary to left thalamic infarct  Continue CIR---dc 9/16---> mod I goals   -may have grounds pass on unit only  -Patient to see me in the office for transitional care encounter in 1-2 weeks.  2. Antithrombotics: -DVT/anticoagulation:Pharmaceutical:Lovenox -antiplatelet therapy: DAPT X 3 weeks followed by ASA alone 3. Pain Management:encouraged continued use of heating pad for right cervical myofascial pain.   -prn tylenol-   9/15: pt with significant OA right hip with ?AVN. I reviewed films personally with patient. Will need ortho referral as outpt. Will d/w her again at her f/u with me 4. Mood:LCSW to follow for evaluation and support. -antipsychotic agents: N/A 5. Neuropsych: This patientiscapable of making decisions on herown behalf. 6. Skin/Wound Care:Routine pressure relief measures. 7. Fluids/Electrolytes/Nutrition:Monitor I/Os.   8. T2DM with hyperglycemia: Hgb a1C- 11.6 and poorly controlled. Has not been on any meds for years.Continue Lantus insulin--willing to go home on this.Monitor BS ac/hs and continue to use SSI for tighter BS control as indicated. Consult dietician for dietary  education. Pt wants to improve her control and diabetes awareness.  Lantus increased to 21   Tradjenta started 9/10  -9/15 will continue current regimen as sugars well controlled 9. HTN: Monitor BP tid-denies history of hypertension.  Reasonable control 10 Schizophrenia/Bipolar d/o/PTSD:Reports that it has been managed with psychotherapy. 11. OSA: Continue CPAP 12. Erythrocytosis:  Hemoglobin 15.4 on 9/8, continue to monitor 13. Dyslipidemia: Lipitor 14.  Transaminitis  LFTs elevated on 9/8, repeat labs demonstrating improvement yesterday  LOS: 8 days A FACE TO FACE EVALUATION WAS PERFORMED  Meredith Staggers 07/27/2020, 8:53 AM

## 2020-07-27 NOTE — Progress Notes (Signed)
Physical Therapy Session Note  Patient Details  Name: [name] [name] [place]uro MRN: [id number] Date of Birth: 1961/05/26  Today's Date: 07/27/2020 PT Individual Time: 0805-0900 and 4098-1191 PT Individual Time Calculation (min): 55 min and 42 min  Short Term Goals: Week 1:  PT Short Term Goal 1 (Week 1): =LTG due to ELOS  Skilled Therapeutic Interventions/Progress Updates:  Treatment 1: Pt received in room & agreeable to tx. Pt ambulates in room with mod I without AD & changes clothes with mod I. Gait around unit without AD & mod I with decreased R LE hip flexion during swing phase & pt reporting she feels she is "limping" 2/2 R hip pain. Pt completes car transfer at sedan simulated height with mod I, negotiates ramp & uneven mulch without AD & mod I, curb step with mod I without AD (PT does provide instructional cuing for compensatory pattern), and negotiates 12 steps (6") with B rails & mod I with reciprocal pattern. Pt completes Berg Balance Test & scores 53/56 & PT educates pt on interpretation of score & fall risk. Patient demonstrates increased fall risk as noted by score of 53/56 on Berg Balance Scale.  (<36= high risk for falls, close to 100%; 37-45 significant >80%; 46-51 moderate >50%; 52-55 lower >25%). Pt made mod I in room & nurse made aware. Pt left in room with all needs at hand. Pain: c/o unrated R hip pain - rest breaks provided PRN & pt reports she's premedicated   Treatment 2: Pt received in recliner & agreeable to tx. Pt ambulates around unit without AD & antalgic gait 2/2 RLE Hip pain. Pt completes bed mobility in apartment with independence, floor transfer with mod I. Pt requests to take it easy & engages in RUE [facility] tasks of retreiving beads from pink thera putty then performing RUE exercises with thera putty with PT giving pt handout & reviewing exercises. Pt reports this is a good workout for RUE fingers. Pt strings a few beads with R 3rd/4th digits & thumb for further [facility] activity  then pt requets to end session early. Pt left in room, mod I. Pain: unrated hip pain - rest breaks provided PRN  Pt missed 33 minutes of skilled PT treatment 2/2 fatigue.  Therapy Documentation Precautions:  Precautions Precautions: None Precaution Comments: right leg weakness, numbness, decreased coordination Restrictions Weight Bearing Restrictions: No   Balance: Balance Balance Assessed: Yes Standardized Balance Assessment Standardized Balance Assessment: Berg Balance Test Berg Balance Test Sit to Stand: Able to stand without using hands and stabilize independently Standing Unsupported: Able to stand safely 2 minutes Sitting with Back Unsupported but Feet Supported on Floor or Stool: Able to sit safely and securely 2 minutes Stand to Sit: Sits safely with minimal use of hands Transfers: Able to transfer safely, minor use of hands Standing Unsupported with Eyes Closed: Able to stand 10 seconds safely Standing Ubsupported with Feet Together: Able to place feet together independently and stand 1 minute safely From Standing, Reach Forward with Outstretched Arm: Can reach confidently >25 cm (10") From Standing Position, Pick up Object from Floor: Able to pick up shoe safely and easily From Standing Position, Turn to Look Behind Over each Shoulder: Looks behind from both sides and weight shifts well Turn 360 Degrees: Able to turn 360 degrees safely in 4 seconds or less Standing Unsupported, Alternately Place Feet on Step/Stool: Able to complete 4 steps without aid or supervision Standing Unsupported, One Foot in Front: Able to plae foot ahead of the  other independently and hold 30 seconds Standing on One Leg: Able to lift leg independently and hold > 10 seconds Total Score: 53    Therapy/Group: Individual Therapy  Sandi Mariscal 07/27/2020, 1:58 PM

## 2020-07-28 DIAGNOSIS — M1612 Unilateral primary osteoarthritis, left hip: Secondary | ICD-10-CM

## 2020-07-28 DIAGNOSIS — M169 Osteoarthritis of hip, unspecified: Secondary | ICD-10-CM

## 2020-07-28 LAB — GLUCOSE, CAPILLARY
Glucose-Capillary: 115 mg/dL — ABNORMAL HIGH (ref 70–99)
Glucose-Capillary: 85 mg/dL (ref 70–99)

## 2020-07-28 MED ORDER — TRAZODONE HCL 50 MG PO TABS: 50.0000 mg | ORAL_TABLET | Freq: Every evening | ORAL | 0 refills | Status: DC | PRN

## 2020-07-28 MED ORDER — CLOPIDOGREL BISULFATE 75 MG PO TABS: 75.0000 mg | ORAL_TABLET | Freq: Every day | ORAL | 0 refills | Status: DC

## 2020-07-28 MED ORDER — INSULIN GLARGINE 100 UNIT/ML ~~LOC~~ SOLN: 21.0000 [IU] | Freq: Every day | SUBCUTANEOUS | 2 refills | Status: DC

## 2020-07-28 MED ORDER — BD INSULIN SYRINGE U-100 1 ML MISC: 1.0000 "application " | Freq: Every day | 0 refills | Status: DC

## 2020-07-28 MED ORDER — "INSULIN SYRINGE-NEEDLE U-100 31G X 5/16"" 0.5 ML MISC": 1.0000 "application " | Freq: Every day | 0 refills | Status: DC

## 2020-07-28 MED ORDER — ATORVASTATIN CALCIUM 80 MG PO TABS: 80.0000 mg | ORAL_TABLET | Freq: Every day | ORAL | 0 refills | Status: DC

## 2020-07-28 MED ORDER — INSULIN GLARGINE 100 UNIT/ML SOLOSTAR PEN: 21.0000 [IU] | PEN_INJECTOR | Freq: Every day | SUBCUTANEOUS | 0 refills | Status: DC

## 2020-07-28 MED ORDER — LINAGLIPTIN 5 MG PO TABS: 5.0000 mg | ORAL_TABLET | Freq: Every day | ORAL | 0 refills | Status: DC

## 2020-07-28 MED ORDER — ADVOCATE INSULIN PEN NEEDLES 31G X 5 MM MISC: 1.0000 "application " | Freq: Every day | 0 refills | Status: DC

## 2020-07-28 MED ORDER — ASPIRIN 81 MG PO TBEC: 81.0000 mg | DELAYED_RELEASE_TABLET | Freq: Every day | ORAL | 11 refills | Status: DC

## 2020-07-28 NOTE — Discharge Instructions (Signed)
Inpatient Rehab Discharge Instructions  [name] [name] Discharge date and time:  07/28/20   Activities/Precautions/ Functional Status: Activity: no lifting, driving, or strenuous exercise till cleared by MD Diet: cardiac diet and diabetic diet Wound Care: none needed    Functional status:  ___ No restrictions     ___ Walk up steps independently ___ 24/7 supervision/assistance   ___ Walk up steps with assistance _X__ Intermittent supervision/assistance  ___ Bathe/dress independently ___ Walk with walker     ___ Bathe/dress with assistance ___ Walk Independently    ___ Shower independently ___ Walk with assistance    _X__ Shower with assistance _X__ No alcohol     ___ Return to work/school ________   COMMUNITY REFERRALS UPON DISCHARGE:    Outpatient: PT     OT                                    Agency: [facility] Phone: [phone]              Appointment Date/Time: *Please expect follow-up within 7-10 business days to schedule your appointment. If you have not received follow-up, be sure to contact the site directly.   Medical Equipment/Items Ordered:no equipment needed  Special Instructions:  STROKE/TIA DISCHARGE INSTRUCTIONS SMOKING Cigarette smoking nearly doubles your risk of having a stroke & is the single most alterable risk factor  If you smoke or have smoked in the last 12 months, you are advised to quit smoking for your health.  Most of the excess cardiovascular risk related to smoking disappears within a year of stopping.  Ask you doctor about anti-smoking medications  [place] Quit Line: 1-800-QUIT NOW  Free Smoking Cessation Classes [phone]  CHOLESTEROL Know your levels; limit fat & cholesterol in your diet  Lipid Panel     Component Value Date/Time   CHOL 274 (H) 07/13/2020 0519   TRIG 232 (H) 07/13/2020 0519   HDL 37 (L) 07/13/2020 0519   CHOLHDL 7.4 07/13/2020 0519   VLDL 46 (H) 07/13/2020 0519   LDLCALC 191 (H) 07/13/2020 0519       Many patients benefit from treatment even if their cholesterol is at goal.  Goal: Total Cholesterol (CHOL) less than 160  Goal:  Triglycerides (TRIG) less than 150  Goal:  HDL greater than 40  Goal:  LDL (LDLCALC) less than 100   BLOOD PRESSURE American Stroke Association blood pressure target is less that 120/80 mm/Hg  Your discharge blood pressure is:  BP: 136/68  Monitor your blood pressure  Limit your salt and alcohol intake  Many individuals will require more than one medication for high blood pressure  DIABETES (A1c is a blood sugar average for last 3 months) Goal HGBA1c is under 7% (HBGA1c is blood sugar average for last 3 months)  Diabetes:     Lab Results  Component Value Date   HGBA1C 11.6 (H) 07/14/2020     Your HGBA1c can be lowered with medications, healthy diet, and exercise.  Check your blood sugar as directed by your physician  Call your physician if you experience unexplained or low blood sugars.  PHYSICAL ACTIVITY/REHABILITATION Goal is 30 minutes at least 4 days per week  Activity: No driving, Therapies: see above Return to work: N/A  Activity decreases your risk of heart attack and stroke and makes your heart stronger.  It helps control your weight and blood pressure; helps you relax and  can improve your mood.  Participate in a regular exercise program.  Talk with your doctor about the best form of exercise for you (dancing, walking, swimming, cycling).  DIET/WEIGHT Goal is to maintain a healthy weight  Your discharge diet is:  Diet Order            Diet Carb Modified Fluid consistency: Thin; Room service appropriate? Yes  Diet effective now               thin liquids Your height is:  Height: 5\' 6"  (167.6 cm) Your current weight is: Weight: 99.4 kg Your Body Mass Index (BMI) is:  BMI (Calculated): 35.39  Following the type of diet specifically designed for you will help prevent another stroke.  Your goal weight is  Your goal Body Mass  Index (BMI) is 19-24.  Healthy food habits can help reduce 3 risk factors for stroke:  High cholesterol, hypertension, and excess weight.  RESOURCES Stroke/Support Group:  Call [phone]   STROKE EDUCATION PROVIDED/REVIEWED AND GIVEN TO PATIENT Stroke warning signs and symptoms How to activate emergency medical system (call 911). Medications prescribed at discharge. Need for follow-up after discharge. Personal risk factors for stroke. Pneumonia vaccine given:  Flu vaccine given:  My questions have been answered, the writing is legible, and I understand these instructions.  I will adhere to these goals & educational materials that have been provided to me after my discharge from the hospital.     My questions have been answered and I understand these instructions. I will adhere to these goals and the provided educational materials after my discharge from the hospital.  Patient/Caregiver Signature _______________________________ Date __________  Clinician Signature _______________________________________ Date __________  Please bring this form and your medication list with you to all your follow-up doctor's appointments.

## 2020-07-28 NOTE — Plan of Care (Signed)
  Problem: Consults Goal: Nutrition Consult-if indicated Description: Pt will have knowledge of diabetic guidelines  Outcome: Completed/Met Goal: Diabetes Guidelines if Diabetic/Glucose > 140 Description: If diabetic or lab glucose is > 140 mg/dl - Initiate Diabetes/Hyperglycemia Guidelines & Document Interventions Pt will have BGT within normal parameters  Outcome: Completed/Met   Problem: RH BOWEL ELIMINATION Goal: RH STG MANAGE BOWEL WITH ASSISTANCE Description: STG Manage Bowel with  min Assistance. Outcome: Completed/Met Goal: RH STG MANAGE BOWEL W/MEDICATION W/ASSISTANCE Description: STG Manage Bowel with Medication with min  Assistance. Pt will have understanding of bowel protocol and medications for constipation Outcome: Completed/Met   Problem: RH BLADDER ELIMINATION Goal: RH STG MANAGE BLADDER WITH ASSISTANCE Description: STG Manage Bladder With Assistance Outcome: Completed/Met   Problem: RH SKIN INTEGRITY Goal: RH STG SKIN FREE OF INFECTION/BREAKDOWN Description: Skin to remain free from infection and breakdown while on rehab with min assist. Outcome: Completed/Met   Problem: RH SAFETY Goal: RH STG ADHERE TO SAFETY PRECAUTIONS W/ASSISTANCE/DEVICE Description: STG Adhere to Safety Precautions With Assistance/Device. Outcome: Completed/Met   Problem: RH PAIN MANAGEMENT Goal: RH STG PAIN MANAGED AT OR BELOW PT'S PAIN GOAL Description: <3 on a 0-10 pain scale. Outcome: Completed/Met   Problem: RH KNOWLEDGE DEFICIT Goal: RH STG INCREASE KNOWLEDGE OF DIABETES Description: Pt will be knowledgeable regarding disease process interventions diet equipment and medications associated with diabetes. Pt will demonstrate proper use of monitoring equipment independently Outcome: Completed/Met Goal: RH STG INCREASE KNOWLEDGE OF HYPERTENSION Description: Patient will have knowledge regarding HTN and the effects on the body systems, medications to treat HTN, s/sx associated with  high B/P. Dietary restrictions and Importance of medical compliance  Outcome: Completed/Met Goal: RH STG INCREASE KNOWLEGDE OF HYPERLIPIDEMIA Description: Pt will have knowledge of the effects of hyperlipidemia and the medications diet and prevention  Outcome: Completed/Met Goal: RH STG INCREASE KNOWLEDGE OF STROKE PROPHYLAXIS Description: Pt will be knowledgeable of the s/sx of stroke importance of medical compliance including medications diet exercise  Outcome: Completed/Met

## 2020-07-28 NOTE — Progress Notes (Signed)
Patient d/c off of floor with all belongings.  D/C instructions explained to family by PA.  No complications noted at this time.

## 2020-07-28 NOTE — Progress Notes (Signed)
Patient called to state that Trajenta prohibitive $350/-month. Substituted Januvia 50 mg #30 pills--pharmacist was unable to give cost of medication. Advised patient to fill Januvia if cheaper --if not to continue monitoring BS ac/hs and Lantus could be titrated upwards. To follow up with Dr. Darron Doom for addition of alternate medication if indicated.

## 2020-07-28 NOTE — Progress Notes (Signed)
Inpatient Rehabilitation Care Coordinator  Discharge Note  The overall goal for the admission was met for:   Discharge location: Yes. D/c to home.   Length of Stay: Yes. 8 days.   Discharge activity level: Yes. Independent  Home/community participation: Yes. Limited.   Services provided included: MD, RD, PT, OT, RN, CM, TR, Pharmacy, Neuropsych and SW  Financial Services: Private Insurance: [facility] Medicare  Follow-up services arranged: Outpatient: [facility] for PT/OT  Comments (or additional information): contact pt [phone]  Patient/Family verbalized understanding of follow-up arrangements: Yes  Individual responsible for coordination of the follow-up plan: Pt to have assistance with coordinating care needs.   Confirmed correct DME delivered: Rana Snare 07/28/2020    Rana Snare

## 2020-07-28 NOTE — Progress Notes (Signed)
[place] PHYSICAL MEDICINE & REHABILITATION PROGRESS NOTE   Subjective/Complaints: No new complaints. Excited to be going home  ROS: Patient denies fever, rash, sore throat, blurred vision, nausea, vomiting, diarrhea, cough, shortness of breath or chest pain, joint or back pain, headache, or mood change.    Objective:   DG HIP UNILAT WITH PELVIS 2-3 VIEWS RIGHT  Result Date: 07/26/2020 CLINICAL DATA:  Chronic right hip pain without recent injury. EXAM: DG HIP (WITH OR WITHOUT PELVIS) 2-3V RIGHT COMPARISON:  None. FINDINGS: There is no evidence of hip fracture or dislocation. Severe narrowing and osteophyte formation of the right hip joint is noted. Sclerosis and lucency is seen in the superior portion of the right femoral head suggesting avascular necrosis. IMPRESSION: Severe osteoarthritis of the right hip. 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 No acute abnormality is noted. Electronically Signed   By: Marijo Conception M.D.   On: 07/26/2020 14:53   Recent Labs    07/25/20 1831  WBC 8.3  HGB 15.7*  HCT 49.0*  PLT 218   Recent Labs    07/25/20 1831  NA 137  K 4.2  CL 105  CO2 20*  GLUCOSE 140*  BUN 16  CREATININE 0.70  CALCIUM 9.5    Intake/Output Summary (Last 24 hours) at 07/28/2020 1056 Last data filed at 07/28/2020 0828 Gross per 24 hour  Intake 940 ml  Output --  Net 940 ml     Physical Exam: Vital Signs Blood pressure 136/68, pulse 68, temperature 98 F (36.7 C), temperature source Oral, resp. rate 16, height 5\' 6"  (1.676 m), weight 99.4 kg, SpO2 98 %.  Constitutional: No distress . Vital signs reviewed. HEENT: EOMI, oral membranes moist Neck: supple Cardiovascular: RRR without murmur. No JVD    Respiratory/Chest: CTA Bilaterally without wheezes or rales. Normal effort    GI/Abdomen: BS +, non-tender, non-distended Ext: no clubbing, cyanosis, or edema Psych: pleasant and cooperative Musc: No edema in extremities. Right groin tenderness with  radiation to buttock Neuro: Alert Motor: RUE: 4+/5 proximal distal RLE: 4/5 proximal distal, still lacks fmc RUE and RLE LUE/LLE: 5/5 proximal and distal  Assessment/Plan: 1. Functional deficits secondary to thalamic stroke which require 3+ hours per day of interdisciplinary therapy in a comprehensive inpatient rehab setting.  Physiatrist is providing close team supervision and 24 hour management of active medical problems listed below.  Physiatrist and rehab team continue to assess barriers to discharge/monitor patient progress toward functional and medical goals  Care Tool:  Bathing    Body parts bathed by patient: Right arm, Left arm, Chest, Abdomen, Front perineal area, Buttocks, Left lower leg, Face, Right lower leg, Left upper leg, Right upper leg         Bathing assist Assist Level: Independent     Upper Body Dressing/Undressing Upper body dressing   What is the patient wearing?: Pull over shirt    Upper body assist Assist Level: Independent    Lower Body Dressing/Undressing Lower body dressing      What is the patient wearing?: Underwear/pull up, Pants     Lower body assist Assist for lower body dressing: Independent     Toileting Toileting    Toileting assist Assist for toileting: Independent     Transfers Chair/bed transfer  Transfers assist     Chair/bed transfer assist level: Independent     Locomotion Ambulation   Ambulation assist      Assist level: Independent Assistive device: No Device Max distance: 150 ft  Walk 10 feet activity   Assist     Assist level: Independent Assistive device: No Device   Walk 50 feet activity   Assist    Assist level: Independent Assistive device: No Device    Walk 150 feet activity   Assist    Assist level: Independent Assistive device: No Device    Walk 10 feet on uneven surface  activity   Assist     Assist level: Independent Assistive device:  (none)    Wheelchair     Assist Will patient use wheelchair at discharge?: No Type of Wheelchair: Manual Wheelchair activity did not occur: N/A (pt ambulatory)  Wheelchair assist level: Supervision/Verbal cueing Max wheelchair distance: 145ft    Wheelchair 50 feet with 2 turns activity    Assist    Wheelchair 50 feet with 2 turns activity did not occur: N/A   Assist Level: Supervision/Verbal cueing   Wheelchair 150 feet activity     Assist  Wheelchair 150 feet activity did not occur: N/A   Assist Level: Minimal Assistance - Patient > 75%   Blood pressure 136/68, pulse 68, temperature 98 F (36.7 C), temperature source Oral, resp. rate 16, height 5\' 6"  (1.676 m), weight 99.4 kg, SpO2 98 %.  Medical Problem List and Plan: 1.Right hemiparesis and functional deficitssecondary to left thalamic infarct  Continue CIR---dc 9/16---> mod I goals   -dc home today. Dc plan reviewed  -Patient to see me in the office for transitional care encounter in 1-2 weeks.  2. Antithrombotics: -DVT/anticoagulation:Pharmaceutical:Lovenox -antiplatelet therapy: DAPT X 3 weeks followed by ASA alone 3. Pain Management:encouraged continued use of heating pad for right cervical myofascial pain.   -prn tylenol-   9/15: pt with significant OA right hip with ?AVN. I reviewed films personally with patient. Will need ortho referral as outpt. Will d/w her again at her f/u visit with me 4. Mood:LCSW to follow for evaluation and support. -antipsychotic agents: N/A 5. Neuropsych: This patientiscapable of making decisions on herown behalf. 6. Skin/Wound Care:Routine pressure relief measures. 7. Fluids/Electrolytes/Nutrition:good po intake.   8. T2DM with hyperglycemia: Hgb a1C- 11.6 and poorly controlled. Has not been on any meds for years.Continue Lantus insulin--willing to go home on this.Monitor BS ac/hs and continue to use SSI for tighter BS control as  indicated. Consult dietician for dietary education. Pt wants to improve her control and diabetes awareness.  Lantus increased to 21   Tradjenta started 9/10  -9/16 will continue current regimen as sugars well controlled  -might be able to wean lantus as outpt 9. HTN: Monitor BP tid-denies history of hypertension.  Reasonable control 9/16 10 Schizophrenia/Bipolar d/o/PTSD:Reports that it has been managed with psychotherapy. 11. OSA: Continue CPAP 12. Erythrocytosis:  Hemoglobin 15.4 on 9/8, continue to monitor 13. Dyslipidemia: Lipitor 14.  Transaminitis  LFTs elevated on 9/8, repeat labs demonstrating improvement yesterday  LOS: 9 days A FACE TO FACE EVALUATION WAS PERFORMED  Meredith Staggers 07/28/2020, 10:56 AM

## 2020-08-01 ENCOUNTER — Telehealth: Payer: Self-pay | Admitting: *Deleted

## 2020-08-01 ENCOUNTER — Telehealth: Payer: Self-pay | Admitting: Nutrition

## 2020-08-01 NOTE — Telephone Encounter (Signed)
Message left on machine that her appointment with me on 08/31/20 will need to be moved to 11/3 at the same time.  She was given the front desk number to call if this will be a problem.

## 2020-08-01 NOTE — Telephone Encounter (Signed)
Transitional care call completed  Appointment confirmed.  Address confirmed  New patient Packet mailed  1. Are you/is patient experiencing any problems since coming home? No  Are there any questions regarding any aspect of care?  No  2. Are there any questions regarding medications administration/dosing? No, patient states that her insulin medication was messed up in the hospital.  She was critical of the PA's working on the rehab floor. Are meds being taken as prescribed? Patient states that thanks to her primary care she is now taking the medication that she should be taking. Patient should review meds with caller to confirm   3. Have there been any falls? No  4. Has Home Health been to the house and/or have they contacted you? Straight to outpatient. Neurorehab If not, have you tried to contact them? Can we help you contact them?   5. Are bowels and bladder emptying properly? Yes  Are there any unexpected incontinence issues? No If applicable, is patient following bowel/bladder programs?   6. Any fevers, problems with breathing, unexpected pain?  No pain, patient complains that she left the hospital with a cold.  7. Are there any skin problems or new areas of breakdown?  No  8. Has the patient/family member arranged specialty MD follow up (ie cardiology/neurology/renal/surgical/etc)? Yes Can we help arrange?   9. Does the patient need any other services or support that we can help arrange? No  10. Are caregivers following through as expected in assisting the patient? Yes  11. Has the patient quit smoking, drinking alcohol, or using drugs as recommended? Yes

## 2020-08-08 ENCOUNTER — Encounter: Payer: Self-pay | Admitting: Registered Nurse

## 2020-08-08 ENCOUNTER — Encounter: Payer: Medicare PPO | Attending: Registered Nurse | Admitting: Registered Nurse

## 2020-08-08 ENCOUNTER — Other Ambulatory Visit: Payer: Self-pay

## 2020-08-08 VITALS — BP 125/80 | HR 70 | Temp 98.8°F | Ht 63.0 in | Wt 207.0 lb

## 2020-08-08 DIAGNOSIS — Z794 Long term (current) use of insulin: Secondary | ICD-10-CM

## 2020-08-08 DIAGNOSIS — M1611 Unilateral primary osteoarthritis, right hip: Secondary | ICD-10-CM | POA: Diagnosis present

## 2020-08-08 DIAGNOSIS — E1165 Type 2 diabetes mellitus with hyperglycemia: Secondary | ICD-10-CM | POA: Diagnosis present

## 2020-08-08 DIAGNOSIS — I639 Cerebral infarction, unspecified: Secondary | ICD-10-CM

## 2020-08-08 DIAGNOSIS — I6381 Other cerebral infarction due to occlusion or stenosis of small artery: Secondary | ICD-10-CM

## 2020-08-08 NOTE — Progress Notes (Signed)
Subjective:    Patient ID: [name] [name], female    DOB: April 30, 1961, 59 y.o.   MRN: [id number]  HPI: [name] [name] is a 59 y.o. female who is here for Transitional care visit of her Thalamic Stroke, Uncontrolled Type 2 Diabetes Mellitus with hyperglycemia, with long-term current use of insulin and Osteoarthritis of Right Hip. [name] [name] presented to [facility] ED on 07/12/2020 with complaints of right sided weakness, numbness and and difficulty with speech expression. Neurology Consulted.  CT Head Code Stroke WO Contrast:  IMPRESSION: 1. Normal head CT. 2. ASPECTS is 10. 3. These results were communicated to Dr. Amada Jupiter at 7:58 pmon 8/31/2021by text page via the [facility] messaging system.  CTA Head W/WO Contrast:  IMPRESSION: 1. No emergent large vessel occlusion or high-grade stenosis of the intracranial arteries. 2. Normal CT perfusion. 3. Diffusely diminutive left vertebral artery. V4 segment may terminate in a small left PICA or be occluded. 4. Aortic Atherosclerosis (ICD10-I70.0).  MRI Brain: WO Contrast:  IMPRESSION: Small acute/early subacute infarct within the left thalamus. No hemorrhage or mass effect.  Per Delle Reining PA-C discharge note: Dr. Pearlean Brownie felt that the stroke was due to small vessel disease and recommended DAPTx 3 weeks followed by ASA alone.   [name] [name] was admitted to inpatient rehabilitation on 07/19/2020 and discharged home on 07/28/2020. She states she has pain in her right hip. She rates her pain 0. Also reports she is walking 15 minutes a day. She reports she hadn't heard from [facility], this provider placed a call to Neuro Rehabilitation, was able to obtain an appointment for 08/10/2020, she verbalizes understanding.   [name] [name] wanted to be released to drive, at this time she will not be released to drive.  [name] [name] was asked to come to office on 08/10/2020, Dr Carlis Abbott will performed driving assessment, she agreed and  verbalizes understanding.  [name] [name] came to office on 08/10/2020, Dr Threasa Heads performed driving assessment  and she was given permission to drive by Dr Carlis Abbott. She was also given driving instructions by Dr Carlis Abbott, she verbalizes understanding.   Pain Inventory Average Pain 0 Pain Right Now 0 My pain is no pain  LOCATION OF PAIN  No pain  BOWEL Number of stools per week: normal Oral laxative use No  Type of laxative none Enema or suppository use No  History of colostomy No  Incontinent No   BLADDER Normal In and out cath, frequency N/A Able to self cath N/A Bladder incontinence No  Frequent urination No  Leakage with coughing No  Difficulty starting stream No  Incomplete bladder emptying No    Mobility walk without assistance ability to climb steps?  yes do you drive?  yes  Function disabled: date disabled . retired  Neuro/Psych No problems in this area  Prior Studies transitional care  Physicians involved in your care transitional care   Family History  Problem Relation Age of Onset  . Alcohol abuse Mother   . Arthritis Mother   . Diabetes Mother   . Hypertension Mother   . Hyperlipidemia Mother   . Alcohol abuse Father   . Arthritis Father   . Asthma Father   . Heart disease Father   . Hypertension Father   . Hyperlipidemia Father   . Alcohol abuse Brother   . Arthritis Brother   . Drug abuse Brother    Social History   Socioeconomic History  . Marital status: Married    Spouse name: Not  on file  . Number of children: Not on file  . Years of education: Not on file  . Highest education level: Not on file  Occupational History  . Not on file  Tobacco Use  . Smoking status: Former Smoker    Quit date: 2004    Years since quitting: 17.7  . Smokeless tobacco: Never Used  Vaping Use  . Vaping Use: Never used  Substance and Sexual Activity  . Alcohol use: Yes    Alcohol/week: 3.0 standard drinks    Types: 1 Glasses of wine, 1 Cans of  beer, 1 Shots of liquor per week    Comment: social once month  . Drug use: No    Comment: THC edible 10mg   . Sexual activity: Yes  Other Topics Concern  . Not on file  Social History Narrative  . Not on file   Social Determinants of Health   Financial Resource Strain:   . Difficulty of Paying Living Expenses: Not on file  Food Insecurity:   . Worried About Programme researcher, broadcasting/film/video in the Last Year: Not on file  . Ran Out of Food in the Last Year: Not on file  Transportation Needs:   . Lack of Transportation (Medical): Not on file  . Lack of Transportation (Non-Medical): Not on file  Physical Activity:   . Days of Exercise per Week: Not on file  . Minutes of Exercise per Session: Not on file  Stress:   . Feeling of Stress : Not on file  Social Connections:   . Frequency of Communication with Friends and Family: Not on file  . Frequency of Social Gatherings with Friends and Family: Not on file  . Attends Religious Services: Not on file  . Active Member of Clubs or Organizations: Not on file  . Attends Banker Meetings: Not on file  . Marital Status: Not on file   Past Surgical History:  Procedure Laterality Date  . BREAST BIOPSY     needle bx more than 15 yrs benign, no scar visible   . LACERATION REPAIR N/A 12/13/2017   Procedure: Closure of nasal lacerations with one rotator graft and one advanced flap with skin graft, closure of left nasal laceration, closure of left ear.;  Surgeon: Suzanna Obey, MD;  Location: [facility];  Service: ENT;  Laterality: N/A;   Past Medical History:  Diagnosis Date  . Chickenpox   . Complication of anesthesia    anxiety and panic  . Diabetes mellitus without complication (HCC)    diet controlled, no longer needs meds  . Elevated liver enzymes    ABD US done 08/2016: fatty liver  . Fatty liver    ABD US done 08/2016: fatty liver  . Hyperlipidemia    no longer needs meds  . Hypertension    pt states no longer needs meds  .  Hypothyroidism   . OA (osteoarthritis) of hip    right  . Schizophrenia (HCC)   . Thyroid disease    no meds   BP 125/80   Pulse 70   Temp 98.8 F (37.1 C)   Ht 5\' 3"  (1.6 m)   Wt 207 lb (93.9 kg)   SpO2 96%   BMI 36.67 kg/m   Opioid Risk Score:   Fall Risk Score:  `1  Depression screen PHQ 2/9  Depression screen [facility] 2/9 08/08/2020 11/08/2017  Decreased Interest 0 2  Down, Depressed, Hopeless 0 2  PHQ - 2 Score 0 4  Altered sleeping 2 3  Tired, decreased energy 1 3  Change in appetite 0 2  Feeling bad or failure about yourself  0 0  Trouble concentrating 1 3  Moving slowly or fidgety/restless 0 3  Suicidal thoughts 0 1  PHQ-9 Score 4 19  Difficult doing work/chores Not difficult at all -    Review of Systems  Constitutional: Negative.   HENT: Negative.   Eyes: Negative.   Respiratory: Negative.   Cardiovascular: Negative.   Gastrointestinal: Negative.   Endocrine: Negative.   Genitourinary: Negative.   Musculoskeletal: Negative.   Skin: Negative.   Allergic/Immunologic: Negative.   Neurological: Negative.   Hematological: Negative.   Psychiatric/Behavioral: Negative.   All other systems reviewed and are negative.      Objective:   Physical Exam Vitals and nursing note reviewed.  Constitutional:      Appearance: Normal appearance.  Cardiovascular:     Rate and Rhythm: Normal rate and regular rhythm.     Pulses: Normal pulses.     Heart sounds: Normal heart sounds.  Pulmonary:     Effort: Pulmonary effort is normal.     Breath sounds: Normal breath sounds.  Musculoskeletal:     Cervical back: Normal range of motion and neck supple.     Comments: Normal Muscle Bulk and Muscle Testing Reveals:  Upper Extremities: Full ROM and Muscle Strength 5/5  Lower Extremities: Full ROM and Muscle Strength 5/5 Arises from Table with ease Antalgic Gait   Skin:    General: Skin is warm and dry.  Neurological:     Mental Status: She is alert and oriented to  person, place, and time.  Psychiatric:        Mood and Affect: Mood normal.        Behavior: Behavior normal.           Assessment & Plan:  1.Thalamic Stroke: She has an appointment with Neuro Rehabilitation on 08/10/2020. She was instructed to call [facility] to Schedule HFU appointment. She verbalizes understanding. This provider placed a call to  [facility] on 08/10/2020, they will call Ms. Bodner to schedule HFU appointment.  Continue HEP as Tolerated. Continue to Monitor.  2. Uncontrolled Type 2 Diabetes Mellitus with hyperglycemia, with long-term current use of insulin. Continue current medication Regimen. PCP Following.  3.Osteoarthritis of Right Hip. Continue current medication regimen and Alternate Ice and Heat Therapy. Continue to monitor.   F/U in 4- 6 weeks with Dr Riley Kill

## 2020-08-08 NOTE — Patient Instructions (Signed)
Call [facility]:   [phone]    To schedule Hospital Follow Up

## 2020-08-10 ENCOUNTER — Ambulatory Visit: Payer: Medicare PPO | Attending: Family Medicine

## 2020-08-10 ENCOUNTER — Other Ambulatory Visit: Payer: Self-pay

## 2020-08-10 DIAGNOSIS — R2689 Other abnormalities of gait and mobility: Secondary | ICD-10-CM | POA: Diagnosis present

## 2020-08-10 DIAGNOSIS — M25551 Pain in right hip: Secondary | ICD-10-CM | POA: Diagnosis present

## 2020-08-10 DIAGNOSIS — M6281 Muscle weakness (generalized): Secondary | ICD-10-CM | POA: Insufficient documentation

## 2020-08-10 NOTE — Therapy (Signed)
[facility] [address] [place], [place], [address] Phone: [phone]   Fax:  [phone]  Physical Therapy Evaluation  Patient Details  Name: [name] [name] MRN: [id number] Date of Birth: 07-04-1961 Referring Provider (PT): Referred by: Charlton Amor, PA-C. Followed by Jacalyn Lefevre, NP   Encounter Date: 08/10/2020   PT End of Session - 08/10/20 1013    Visit Number 1    Number of Visits 1    Authorization Type [employer]    PT Start Time 0930    PT Stop Time 1004    PT Time Calculation (min) 34 min    Activity Tolerance Patient tolerated treatment well    Behavior During Therapy WFL for tasks assessed/performed           Past Medical History:  Diagnosis Date  . Chickenpox   . Complication of anesthesia    anxiety and panic  . Diabetes mellitus without complication (HCC)    diet controlled, no longer needs meds  . Elevated liver enzymes    ABD US done 08/2016: fatty liver  . Fatty liver    ABD US done 08/2016: fatty liver  . Hyperlipidemia    no longer needs meds  . Hypertension    pt states no longer needs meds  . Hypothyroidism   . OA (osteoarthritis) of hip    right  . Schizophrenia (HCC)   . Thyroid disease    no meds    Past Surgical History:  Procedure Laterality Date  . BREAST BIOPSY     needle bx more than 15 yrs benign, no scar visible   . LACERATION REPAIR N/A 12/13/2017   Procedure: Closure of nasal lacerations with one rotator graft and one advanced flap with skin graft, closure of left nasal laceration, closure of left ear.;  Surgeon: Suzanna Obey, MD;  Location: [facility];  Service: ENT;  Laterality: N/A;    There were no vitals filed for this visit.    Subjective Assessment - 08/10/20 0933    Subjective Admitted on 07/12/2020 with headache, right sided weakness and numbness. MRI brain showed small acute/early subacute left thalamic infarct. Patient was discharged home  on 07/28/20. Patient reports doing well, not using an AD. No falls since patient has been home.During inpatient rehab stay, patient reported issues with R Hip pain that affected endurance and mobility. 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 Patient only has pain in the R hip at times, report that alot of deficits she has believes it is due to the OA in R hip and was there prior to stroke. Patient reports that she is walking approx 15 minutes a day.    Pertinent History T2DM - Diet Controlled, HTN, Fatty Liver, Schizoaffective Disorder, Hyperlipidemia, OA of Hip.    Limitations Walking;Standing    Patient Stated Goals To not be in therapy    Currently in Pain? No/denies              [facility] PT Assessment - 08/10/20 0943      Assessment   Medical Diagnosis Acute Left Thalamic Infarct    Referring Provider (PT) Referred by: Charlton Amor, PA-C. Followed by Jacalyn Lefevre, NP    Onset Date/Surgical Date 07/12/20    Hand Dominance Right    Prior Therapy CIR      Balance Screen   Has the patient fallen in the past 6  months No    Has the patient had a decrease in activity level because of a fear of falling?  No    Is the patient reluctant to leave their home because of a fear of falling?  No      Home Tourist information centre manager residence    Living Arrangements Spouse/significant other    Available Help at Discharge Family    Type of Home House    Home Access Stairs to enter    Entrance Stairs-Number of Steps 4    Entrance Stairs-Rails Right;Left;Can reach both    Home Layout One level    Home Equipment None      Prior Function   Level of Independence Independent    Vocation On disability    Leisure Riding Bike, [employer] [place], Marine scientist      Cognition   Overall Cognitive Status Within Functional Limits for tasks assessed      Observation/Other Assessments   Focus  on Therapeutic Outcomes (FOTO)  79/100      Sensation   Light Touch Appears Intact    Hot/Cold Appears Intact      Coordination   Heel Shin Test WFL      ROM / Strength   AROM / PROM / Strength AROM;Strength      AROM   Overall AROM  Deficits    Overall AROM Comments limited on R hip ROM but this deficit was prior to stroke due to OA      Strength   Overall Strength Deficits;Due to pain    Strength Assessment Site Hip;Knee;Ankle    Right/Left Hip Right;Left    Right Hip Flexion 3/5   no overpressure due to pain   Right Hip ABduction 4+/5    Right Hip ADduction 4+/5    Left Hip Flexion 4+/5    Left Hip ABduction 4+/5    Left Hip ADduction 4+/5    Right/Left Knee Right;Left    Right Knee Flexion 5/5    Right Knee Extension 5/5    Left Knee Flexion 5/5    Left Knee Extension 5/5    Right/Left Ankle Right;Left    Right Ankle Dorsiflexion 4+/5    Left Ankle Dorsiflexion 4+/5      Transfers   Transfers Sit to Stand;Stand to Sit    Sit to Stand 6: Modified independent (Device/Increase time)    Five time sit to stand comments  12.1 secs without UE support from standard height chair    Stand to Sit 6: Modified independent (Device/Increase time)      Ambulation/Gait   Ambulation/Gait Yes    Ambulation/Gait Assistance 5: Supervision    Ambulation/Gait Assistance Details Ambulation around therapy gym without AD and supervision. No instances of imbalance. Patient demonstrates antalgic gait pattern due to R Hip pain due to OA. Patient reports this was how she ambulated prior to recent CVA. Patient able to scan environment with head turns without any imbalance noted.     Ambulation Distance (Feet) 200 Feet    Assistive device None    Gait Pattern Step-through pattern;Antalgic    Ambulation Surface Level;Indoor    Gait velocity 10.1 secs = 3.24 ft/sec    Stairs Yes    Stairs Assistance 5: Supervision    Stairs Assistance Details (indicate cue type and reason) completed  ascend/descend with reciprocal pattern, single rail on R with alternating pattern    Stair Management Technique One rail Right;Alternating pattern;Forwards    Number of Stairs 8  Height of Stairs 6      High Level Balance   High Level Balance Activites Other (comment)    High Level Balance Comments Completed M-CSTIB: patient able to hold situation 1-4 for full 30 seconds without any imbalance noted today.                       Objective measurements completed on examination: See above findings.               PT Education - 08/10/20 1014    Education Details Educated on evaluation findings; no PT services needed at this time; ortho consult for R Hip    Person(s) Educated Patient    Methods Explanation    Comprehension Verbalized understanding                       Plan - 08/10/20 1119    Clinical Impression Statement Patient is a 59 y.o. female that was referred to neuro OPPT services for L thalamic CVA. Patient's PMH is signficant for T2DM - Diet Controlled, HTN, Fatty Liver, Schizoaffective Disorder, Hyperlipidemia, OA of Hip. 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 Patient is planning to follow up with orthopedics regarding this. Upon evaluation: patient demonstrates decreased strength in RLE (hip region), decreased R hip ROM, and antalgic gait pattern. Patient reports that all of these findings were present prior to CVA. Patient is ambulating at 3.24 ft/sec, demonstrating community ambulation. PT educating no need for neuro PT services at this time. Educated to follow up with neurology/orthopedics as well and will benefit from orthopedic PT services due to R Hip OA. Patient verbalizing understanding.    Personal Factors and Comorbidities Comorbidity 3+    Comorbidities T2DM - Diet Controlled, HTN, Fatty Liver, Schizoaffective Disorder,  Hyperlipidemia, OA of Hip.    Examination-Activity Limitations Stand;Squat    Examination-Participation Restrictions Driving;Community Activity    Stability/Clinical Decision Making Stable/Uncomplicated    Clinical Decision Making Low    PT Treatment/Interventions Stair training;Gait training;Therapeutic activities;Therapeutic exercise;Balance training;Neuromuscular re-education;Patient/family education;Manual techniques    Consulted and Agree with Plan of Care Patient           Patient will benefit from skilled therapeutic intervention in order to improve the following deficits and impairments:  Abnormal gait, Decreased range of motion, Pain, Difficulty walking, Decreased strength  Visit Diagnosis: Muscle weakness (generalized)  Pain in right hip  Other abnormalities of gait and mobility     Problem List Patient Active Problem List   Diagnosis Date Noted  . Osteoarthritis of hip 07/28/2020  . Transaminitis   . Erythrocytosis   . Uncontrolled type 2 diabetes mellitus with hyperglycemia, with long-term current use of insulin (HCC)   . 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  . Schizoaffective disorder (HCC) 07/14/2020  . Acute ischemic stroke (HCC) 07/13/2020  . Dog bite of face 08/13/2018  . Dog bite of ear 08/13/2018  . Diabetes mellitus without complication (HCC)   . Mood disorder (HCC) 11/11/2017  . Type 2 diabetes mellitus without complication, without long-term current use of insulin (HCC) 11/11/2017  . Class 2 obesity with body mass index (BMI) of 37.0 to 37.9 in adult 11/11/2017  . Mixed hyperlipidemia 11/11/2017  . Tinea corporis 11/08/2017    Tempie Donning, PT, DPT 08/10/2020, 11:31 AM  [facility] [address] [place], [place],  [address] Phone: [phone]   Fax:  [phone]  Name: [name] [name] MRN: [id number] Date of Birth: [birth year]

## 2020-08-18 ENCOUNTER — Ambulatory Visit
Admit: 2020-08-18 | Discharge: 2020-08-18 | Disposition: A | Discharge status: Home / Self Care | Payer: Medicare PPO | Attending: Family Medicine | Admitting: Family Medicine

## 2020-08-18 ENCOUNTER — Other Ambulatory Visit: Payer: Self-pay

## 2020-08-18 DIAGNOSIS — Z1231 Encounter for screening mammogram for malignant neoplasm of breast: Secondary | ICD-10-CM

## 2020-08-18 IMAGING — MG DIGITAL SCREENING BILAT W/ TOMO W/ CAD
8 series · 8 of 24 positions shown · non-contrast
Comparison: Previous exam(s).

CLINICAL DATA: Screening.

EXAM:
DIGITAL SCREENING BILATERAL MAMMOGRAM WITH TOMO AND CAD

[R CC synth-2D]
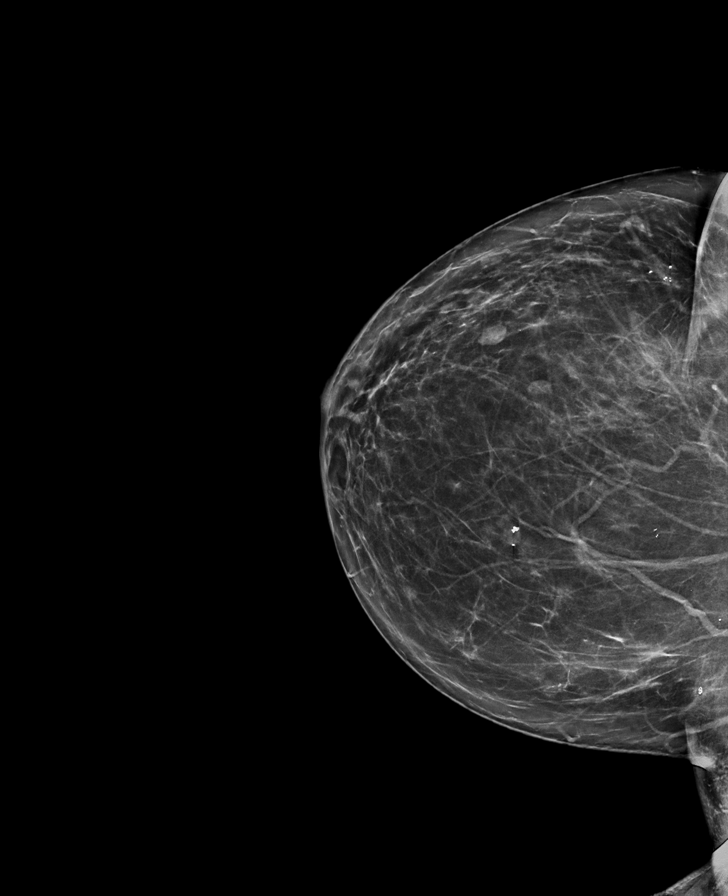

[L MLO synth-2D]
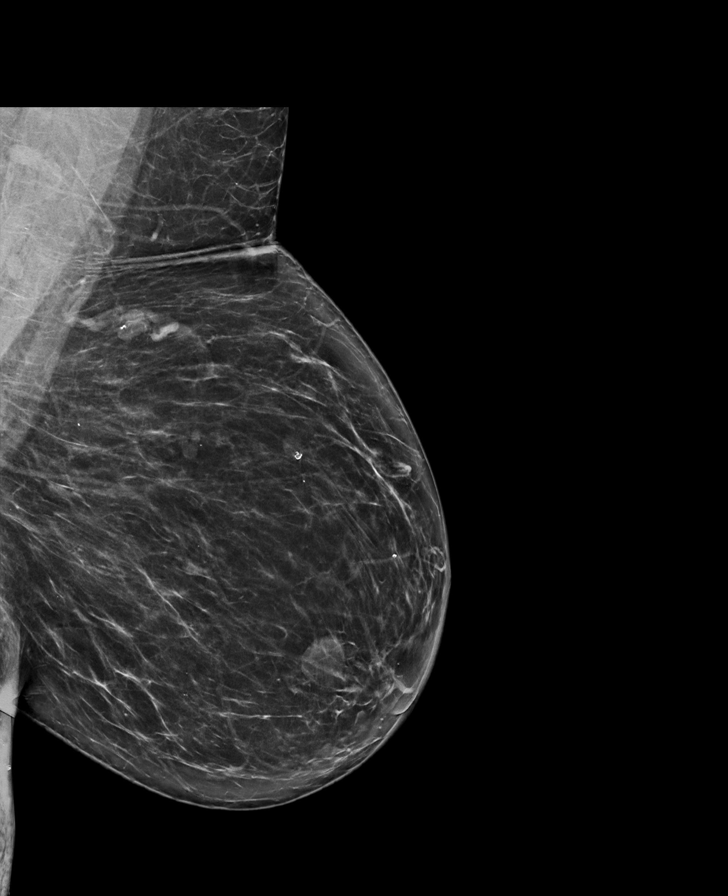

[L CC synth-2D]
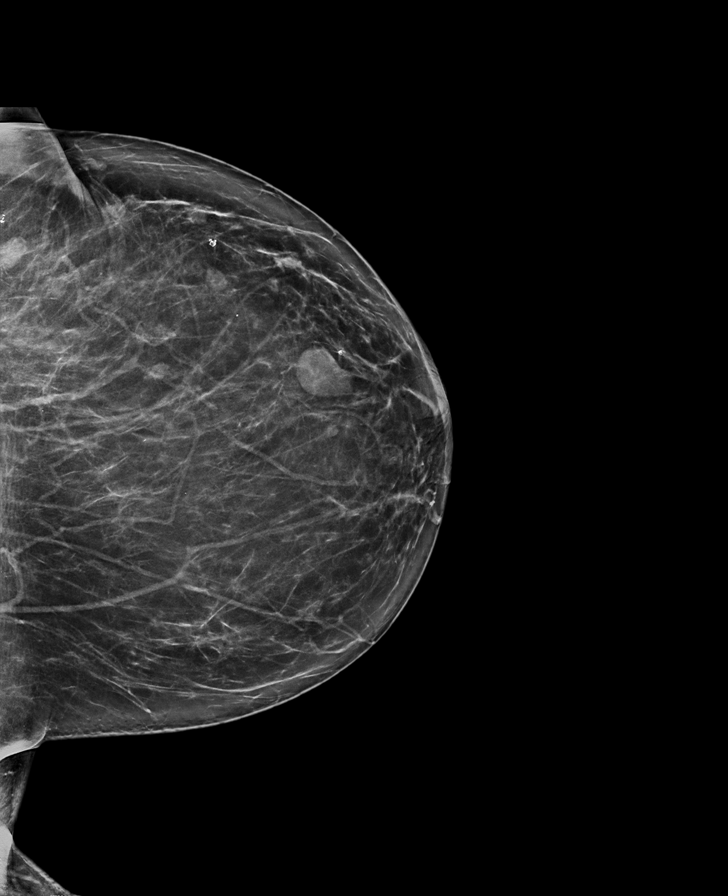

[R MLO synth-2D]
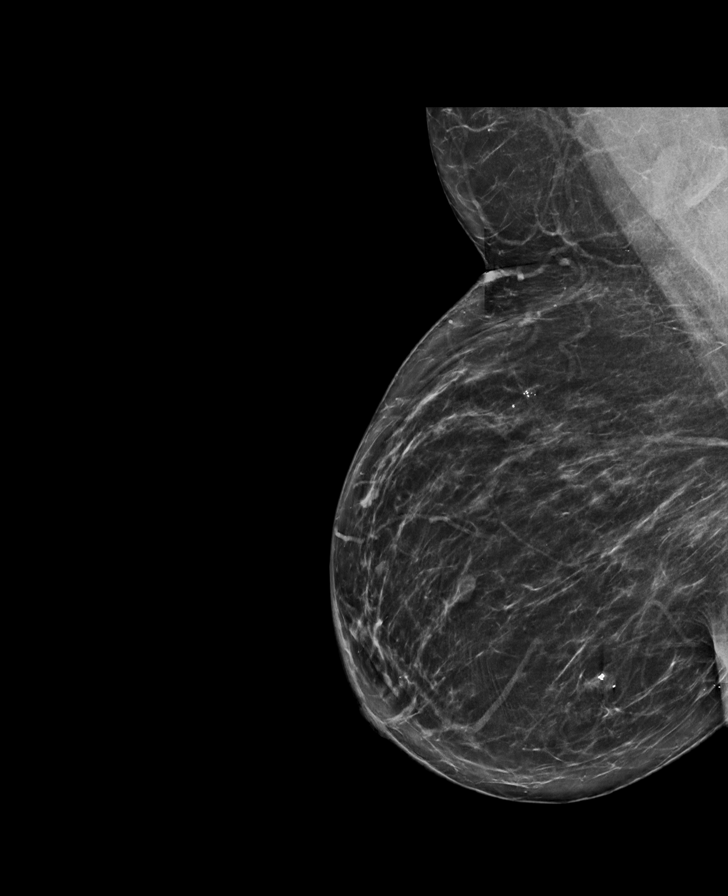

[R CC tomo · tomo slice 35/68.0]
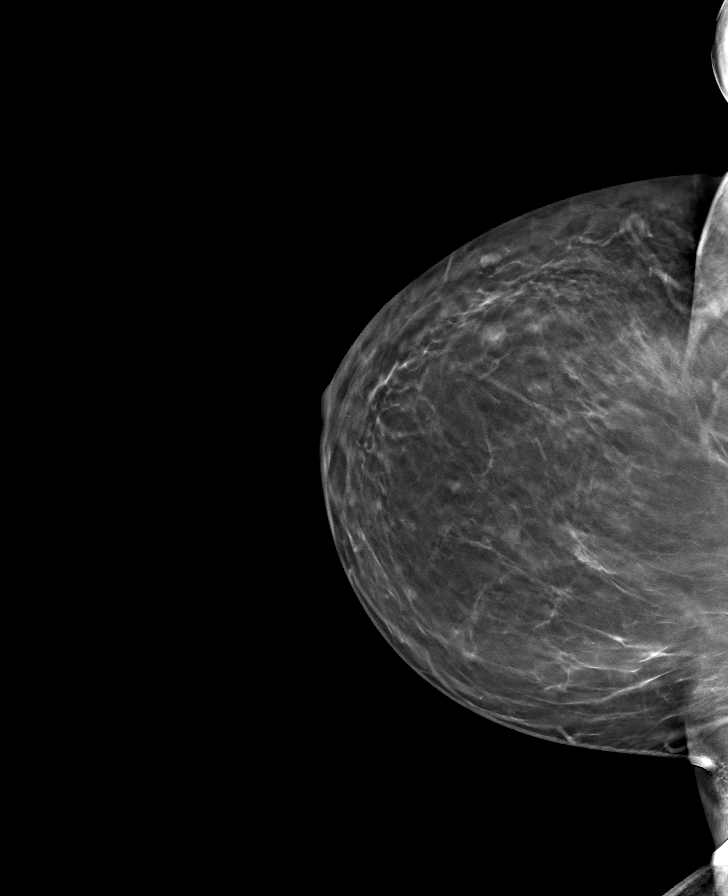

[L MLO tomo · tomo slice 40/79.0]
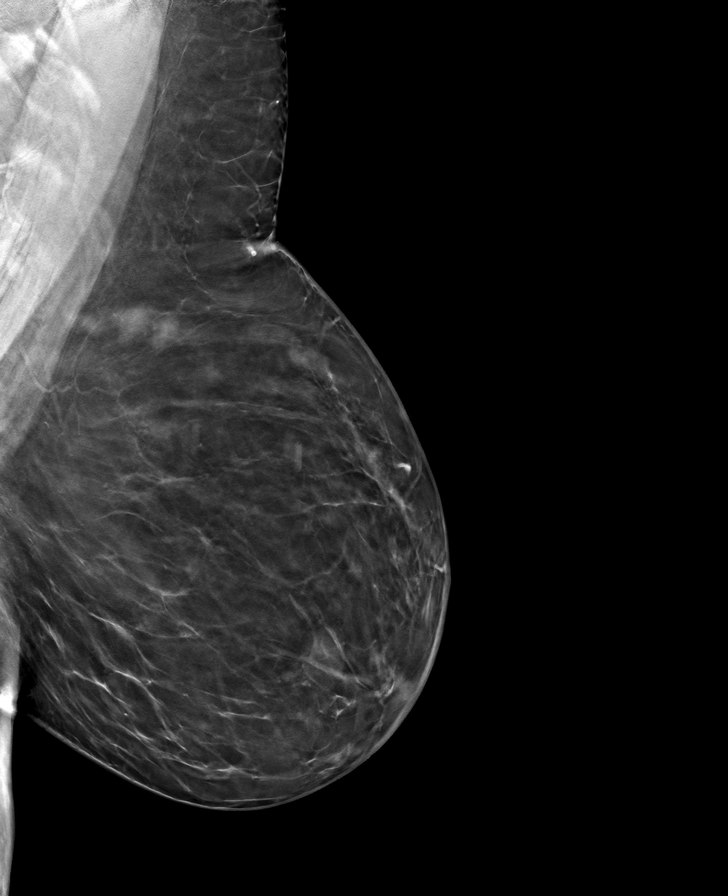

[L CC tomo · tomo slice 35/69.0]
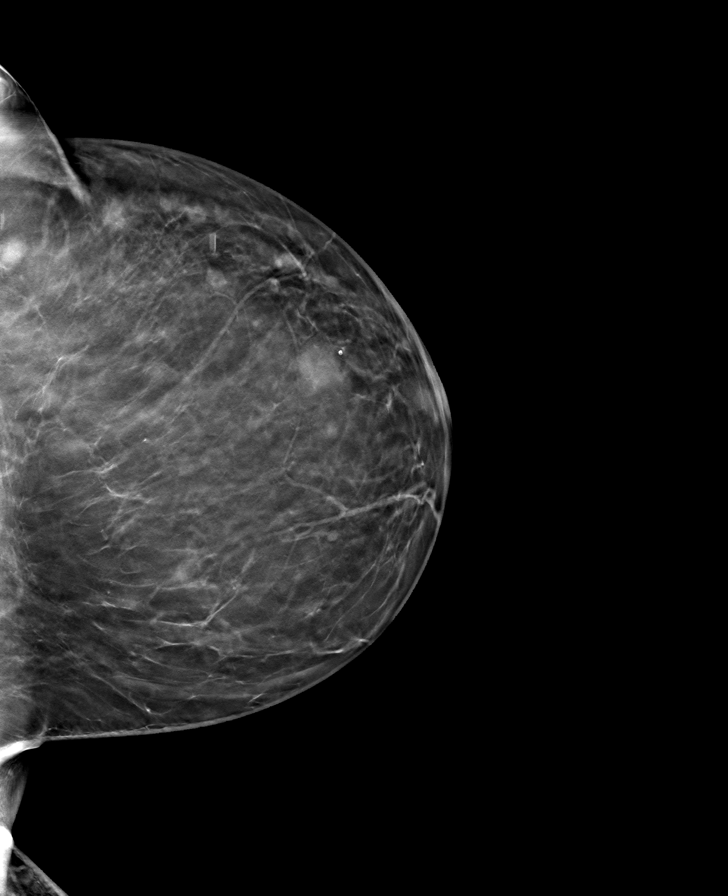

[R MLO tomo · tomo slice 37/74.0]
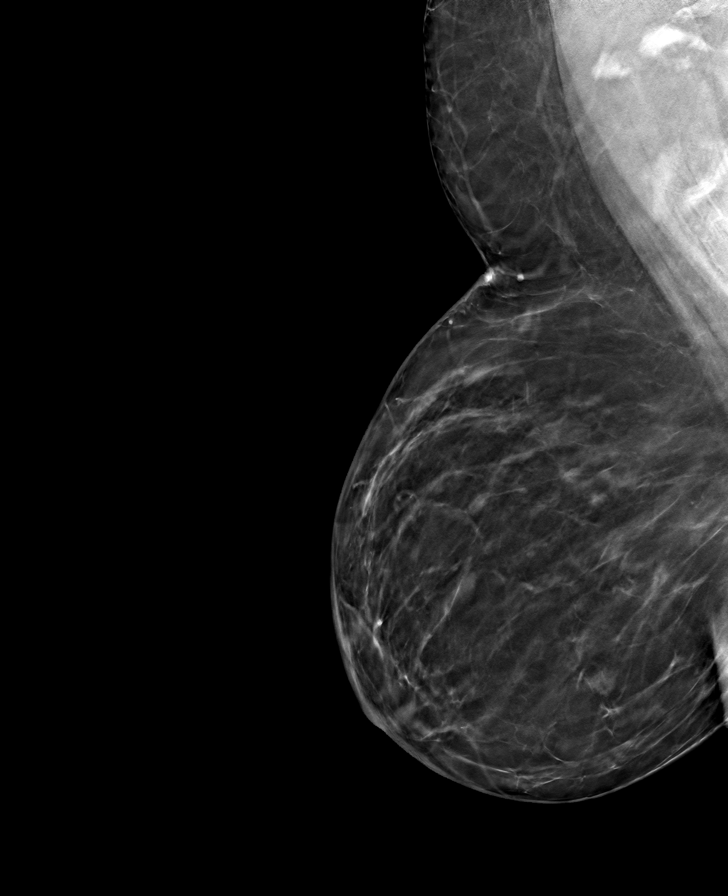

[8 of 24 positions shown; findings below may reference images not displayed]

ACR Breast Density Category b: There are scattered areas of
fibroglandular density.
FINDINGS: There are no findings suspicious for malignancy. Images were
processed with CAD.
IMPRESSION: No mammographic evidence of malignancy. A result letter of this
screening mammogram will be mailed directly to the patient.

RECOMMENDATION:
Screening mammogram in one year. (Code:[TQ])

BI-RADS CATEGORY  1: Negative.

## 2020-08-23 ENCOUNTER — Other Ambulatory Visit: Payer: Self-pay

## 2020-08-23 ENCOUNTER — Ambulatory Visit: Payer: Medicare PPO | Admitting: Occupational Therapy

## 2020-08-23 DIAGNOSIS — M6281 Muscle weakness (generalized): Secondary | ICD-10-CM

## 2020-08-23 DIAGNOSIS — M25551 Pain in right hip: Secondary | ICD-10-CM | POA: Insufficient documentation

## 2020-08-23 DIAGNOSIS — R2689 Other abnormalities of gait and mobility: Secondary | ICD-10-CM | POA: Insufficient documentation

## 2020-08-23 NOTE — Therapy (Signed)
[facility] [address] [place], [place], [address] Phone: [phone]   Fax:  [phone]  Patient Details  Name: [name] [name] MRN: [id number] Date of Birth: 1961-03-20 Referring Provider:  Hayden Rasmussen, MD  Encounter Date: 08/23/2020   Pt arrived  for her OT eval stating that she does not think she needs OT. Pt was seen for a quick screen. Pt denies coordination and strength deficits in UE's and she denies cognitive changes. She states she has resumed all ADLs and IADLS at an independent level.  OT eval was deferred per pt request as pt states she is at baseline level of function.  Ricka Westra 08/23/2020, 1:00 PM  [facility] [address] [place], [place], [address] Phone: [phone]   Fax:  [phone]

## 2020-08-25 ENCOUNTER — Inpatient Hospital Stay: Payer: Medicare PPO | Admitting: Adult Health

## 2020-08-31 ENCOUNTER — Ambulatory Visit: Payer: Medicare PPO | Admitting: Nutrition

## 2020-09-05 ENCOUNTER — Encounter: Payer: Self-pay | Admitting: Adult Health

## 2020-09-05 ENCOUNTER — Ambulatory Visit: Payer: Medicare PPO | Admitting: Adult Health

## 2020-09-05 VITALS — BP 123/65 | HR 76 | Ht 63.0 in | Wt 212.0 lb

## 2020-09-05 DIAGNOSIS — E782 Mixed hyperlipidemia: Secondary | ICD-10-CM

## 2020-09-05 DIAGNOSIS — E1165 Type 2 diabetes mellitus with hyperglycemia: Secondary | ICD-10-CM | POA: Diagnosis not present

## 2020-09-05 DIAGNOSIS — I10 Essential (primary) hypertension: Secondary | ICD-10-CM | POA: Diagnosis not present

## 2020-09-05 DIAGNOSIS — I6381 Other cerebral infarction due to occlusion or stenosis of small artery: Secondary | ICD-10-CM

## 2020-09-05 DIAGNOSIS — Z794 Long term (current) use of insulin: Secondary | ICD-10-CM

## 2020-09-05 DIAGNOSIS — I639 Cerebral infarction, unspecified: Secondary | ICD-10-CM | POA: Diagnosis not present

## 2020-09-05 NOTE — Patient Instructions (Addendum)
Continue aspirin 81 mg daily  and atorvastatin for secondary stroke prevention  Continue to follow up with PCP regarding cholesterol, blood pressure and diabetes management  Maintain strict control of hypertension with blood pressure goal below 130/90, diabetes with hemoglobin A1c goal below 7% and cholesterol with LDL cholesterol (bad cholesterol) goal below 70 mg/dL.     Followup in the future with me in 3 months or call earlier if needed     Thank you for coming to see [place] at [facility]. I hope we have been able to provide you high quality care today.  You may receive a patient satisfaction survey over the next few weeks. We would appreciate your feedback and comments so that we may continue to improve ourselves and the health of our patients.     Stroke Prevention Some medical conditions and behaviors are associated with a higher chance of having a stroke. You can help prevent a stroke by making nutrition, lifestyle, and other changes, including managing any medical conditions you may have. What nutrition changes can be made?   Eat healthy foods. You can do this by: ? Choosing foods high in fiber, such as fresh fruits and vegetables and whole grains. ? Eating at least 5 or more servings of fruits and vegetables a day. Try to fill half of your plate at each meal with fruits and vegetables. ? Choosing lean protein foods, such as lean cuts of meat, poultry without skin, fish, tofu, beans, and nuts. ? Eating low-fat dairy products. ? Avoiding foods that are high in salt (sodium). This can help lower blood pressure. ? Avoiding foods that have saturated fat, trans fat, and cholesterol. This can help prevent high cholesterol. ? Avoiding processed and premade foods.  Follow your health care provider's specific guidelines for losing weight, controlling high blood pressure (hypertension), lowering high cholesterol, and managing diabetes. These may include: ? Reducing  your daily calorie intake. ? Limiting your daily sodium intake to 1,500 milligrams (mg). ? Using only healthy fats for cooking, such as olive oil, canola oil, or sunflower oil. ? Counting your daily carbohydrate intake. What lifestyle changes can be made?  Maintain a healthy weight. Talk to your health care provider about your ideal weight.  Get at least 30 minutes of moderate physical activity at least 5 days a week. Moderate activity includes brisk walking, biking, and swimming.  Do not use any products that contain nicotine or tobacco, such as cigarettes and e-cigarettes. If you need help quitting, ask your health care provider. It may also be helpful to avoid exposure to secondhand smoke.  Limit alcohol intake to no more than 1 drink a day for nonpregnant women and 2 drinks a day for men. One drink equals 12 oz of beer, 5 oz of wine, or 1 oz of hard liquor.  Stop any illegal drug use.  Avoid taking birth control pills. Talk to your health care provider about the risks of taking birth control pills if: ? You are over 74 years old. ? You smoke. ? You get migraines. ? You have ever had a blood clot. What other changes can be made?  Manage your cholesterol levels. ? Eating a healthy diet is important for preventing high cholesterol. If cholesterol cannot be managed through diet alone, you may also need to take medicines. ? Take any prescribed medicines to control your cholesterol as told by your health care provider.  Manage your diabetes. ? Eating a healthy diet and exercising regularly are important  parts of managing your blood sugar. If your blood sugar cannot be managed through diet and exercise, you may need to take medicines. ? Take any prescribed medicines to control your diabetes as told by your health care provider.  Control your hypertension. ? To reduce your risk of stroke, try to keep your blood pressure below 130/80. ? Eating a healthy diet and exercising regularly are  an important part of controlling your blood pressure. If your blood pressure cannot be managed through diet and exercise, you may need to take medicines. ? Take any prescribed medicines to control hypertension as told by your health care provider. ? Ask your health care provider if you should monitor your blood pressure at home. ? Have your blood pressure checked every year, even if your blood pressure is normal. Blood pressure increases with age and some medical conditions.  Get evaluated for sleep disorders (sleep apnea). Talk to your health care provider about getting a sleep evaluation if you snore a lot or have excessive sleepiness.  Take over-the-counter and prescription medicines only as told by your health care provider. Aspirin or blood thinners (antiplatelets or anticoagulants) may be recommended to reduce your risk of forming blood clots that can lead to stroke.  Make sure that any other medical conditions you have, such as atrial fibrillation or atherosclerosis, are managed. What are the warning signs of a stroke? The warning signs of a stroke can be easily remembered as BEFAST.  B is for balance. Signs include: ? Dizziness. ? Loss of balance or coordination. ? Sudden trouble walking.  E is for eyes. Signs include: ? A sudden change in vision. ? Trouble seeing.  F is for face. Signs include: ? Sudden weakness or numbness of the face. ? The face or eyelid drooping to one side.  A is for arms. Signs include: ? Sudden weakness or numbness of the arm, usually on one side of the body.  S is for speech. Signs include: ? Trouble speaking (aphasia). ? Trouble understanding.  T is for time. ? These symptoms may represent a serious problem that is an emergency. Do not wait to see if the symptoms will go away. Get medical help right away. Call your local emergency services (911 in the [place] Do not drive yourself to the hospital.  Other signs of stroke may include: ? A sudden,  severe headache with no known cause. ? Nausea or vomiting. ? Seizure. Where to find more information For more information, visit:  American Stroke Association: www.strokeassociation.org  National Stroke Association: www.stroke.org Summary  You can prevent a stroke by eating healthy, exercising, not smoking, limiting alcohol intake, and managing any medical conditions you may have.  Do not use any products that contain nicotine or tobacco, such as cigarettes and e-cigarettes. If you need help quitting, ask your health care provider. It may also be helpful to avoid exposure to secondhand smoke.  Remember BEFAST for warning signs of stroke. Get help right away if you or a loved one has any of these signs. This information is not intended to replace advice given to you by your health care provider. Make sure you discuss any questions you have with your health care provider. Document Revised: 10/11/2017 Document Reviewed: 12/04/2016 Elsevier Patient Education  2020 [employer].

## 2020-09-05 NOTE — Progress Notes (Signed)
[facility] [address] [place] [place] [address] [phone]       HOSPITAL FOLLOW UP NOTE  [name] Date of Birth:  27-Aug-1961 Medical Record Number:  [id number]   Reason for Referral:  hospital stroke follow up    SUBJECTIVE:   CHIEF COMPLAINT:  Chief Complaint  Patient presents with  . Hospitalization Follow-up    rm 9  . Cerebrovascular Accident    pt has no new sx since the stroke. Pt said the sxshe did have are much better    HPI:   [name] is a 59 y.o. female with history of migraine with aura who presented on 07/12/2020 with right-sided weakness and numbness and some difficulty with expressing herself w/ HA. Personally reviewed hospitalization pertinent progress notes, lab work and imaging with summary provided. Evaluated by Dr. Pearlean Brownie with stroke work up revealing left thalamic stroke secondary to small vessel disease. Recommend DAPT for 3 weeks then aspirin alone. Hx of HTN stable with long term BP goal normotensive range. LDL 191 and initiated atorvastatin 80mg  daily. Uncontrolled DM with A1c 11.6. Other stroke risk factors include THC use, former tobacco use, obesity and hx of migraines. No prior stroke history. Patient currently participating in sleep trial randomized to CPAP treatment.  Evaluated by therapy and recommended discharge to CIR for ongoing therapy needs.  Stroke:   L thalamic lacunar infarct secondary to small vessel disease source  Code Stroke CT head No acute abnormality. ASPECTS 10.     CTA head & neck no ELVO. 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 Aortic atherosclerosis.   CT perfusion negative  MRI  Small subacute L thalamic lacune   2D Echo left ventricular ejection fraction 70 to 75%.  No cardiac source of embolism.    LDL 191  HgbA1c  11.6  UDS +THC  VTE prophylaxis - Lovenox 40 mg sq daily   No antithrombotic prior to admission, now on aspirin 325 mg daily.  Decrease aspirin to 81 and add plavix 75 mg daily. Continue DAPT x 3 weeks then aspirin alone  Therapy recommendations: CIR  disposition:  CIR  Today, 09/05/2020, [name] is being seen for hospital follow-up accompanied by her husband.  She was discharged home on 07/28/2020 with recommendation of outpatient PT and OT.  Reports she has been doing well since discharge with residual right-sided heaviness sensation, right foot dragging with increased fatigue and slower movements on right side.  Also reports increased fatigue and decreased activity tolerance which has been slowly improving.  She was evaluated by PT/OT without additional therapy needs and continues to do exercises at home.  Continues to participate in sleep smart trial reporting tolerance to CPAP.  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  She has follow-up visit in December to further discuss possible replacement procedure.  Denies new or worsening stroke/TIA symptoms.  Completed 3 weeks DAPT and remains on aspirin alone without bleeding or bruising.  Remains on atorvastatin 80 mg daily without myalgias.  Blood pressure today 123/65.  Glucose levels have been stable around 100-120 and had recent lab work by PCP including A1c but unable to view via epic.  No further concerns at this time.      ROS:   14 system review of systems performed and negative with exception of those listed in HPI  PMH:  Past  Medical History:  Diagnosis Date  . Chickenpox   . Complication of anesthesia    anxiety and panic  . Diabetes mellitus without complication (HCC)    diet controlled, no longer needs meds  . Elevated liver enzymes    ABD US done 08/2016: fatty liver  . Fatty liver    ABD US done 08/2016: fatty liver  . Hyperlipidemia    no longer needs meds  . Hypertension    pt states no longer needs meds  . Hypothyroidism   . OA  (osteoarthritis) of hip    right  . Schizophrenia (HCC)   . Thyroid disease    no meds    PSH:  Past Surgical History:  Procedure Laterality Date  . BREAST BIOPSY     needle bx more than 15 yrs benign, no scar visible   . LACERATION REPAIR N/A 12/13/2017   Procedure: Closure of nasal lacerations with one rotator graft and one advanced flap with skin graft, closure of left nasal laceration, closure of left ear.;  Surgeon: Suzanna Obey, MD;  Location: [facility];  Service: ENT;  Laterality: N/A;    Social History:  Social History   Socioeconomic History  . Marital status: Married    Spouse name: Not on file  . Number of children: Not on file  . Years of education: Not on file  . Highest education level: Not on file  Occupational History  . Not on file  Tobacco Use  . Smoking status: Former Smoker    Quit date: 2004    Years since quitting: 17.8  . Smokeless tobacco: Never Used  Vaping Use  . Vaping Use: Never used  Substance and Sexual Activity  . Alcohol use: Yes    Alcohol/week: 3.0 standard drinks    Types: 1 Glasses of wine, 1 Cans of beer, 1 Shots of liquor per week    Comment: social once month  . Drug use: No    Comment: THC edible 10mg   . Sexual activity: Yes  Other Topics Concern  . Not on file  Social History Narrative  . Not on file   Social Determinants of Health   Financial Resource Strain:   . Difficulty of Paying Living Expenses: Not on file  Food Insecurity:   . Worried About Programme researcher, broadcasting/film/video in the Last Year: Not on file  . Ran Out of Food in the Last Year: Not on file  Transportation Needs:   . Lack of Transportation (Medical): Not on file  . Lack of Transportation (Non-Medical): Not on file  Physical Activity:   . Days of Exercise per Week: Not on file  . Minutes of Exercise per Session: Not on file  Stress:   . Feeling of Stress : Not on file  Social Connections:   . Frequency of Communication with Friends and Family: Not on file  .  Frequency of Social Gatherings with Friends and Family: Not on file  . Attends Religious Services: Not on file  . Active Member of Clubs or Organizations: Not on file  . Attends Banker Meetings: Not on file  . Marital Status: Not on file  Intimate Partner Violence:   . Fear of Current or Ex-Partner: Not on file  . Emotionally Abused: Not on file  . Physically Abused: Not on file  . Sexually Abused: Not on file    Family History:  Family History  Problem Relation Age of Onset  . Alcohol abuse Mother   .  Arthritis Mother   . Diabetes Mother   . Hypertension Mother   . Hyperlipidemia Mother   . Alcohol abuse Father   . Arthritis Father   . Asthma Father   . Heart disease Father   . Hypertension Father   . Hyperlipidemia Father   . Alcohol abuse Brother   . Arthritis Brother   . Drug abuse Brother     Medications:   Current Outpatient Medications on File Prior to Visit  Medication Sig Dispense Refill  . acetaminophen (TYLENOL) 325 MG tablet Take 1-2 tablets (325-650 mg total) by mouth every 4 (four) hours as needed for mild pain.    Marland Kitchen aspirin 81 MG EC tablet Take 1 tablet (81 mg total) by mouth daily. Swallow whole. 100 tablet 11  . atorvastatin (LIPITOR) 80 MG tablet Take 1 tablet (80 mg total) by mouth daily. 30 tablet 0  . empagliflozin (JARDIANCE) 25 MG TABS tablet Take by mouth daily.    . insulin glargine (LANTUS) 100 UNIT/ML Solostar Pen Inject 21 Units into the skin daily. 15 mL 0  . Insulin Pen Needle (ADVOCATE INSULIN PEN NEEDLES) 31G X 5 MM MISC 1 application by Does not apply route daily. 100 each 0   No current facility-administered medications on file prior to visit.    Allergies:   Allergies  Allergen Reactions  . Citric Acid   . Dairy Aid [Lactase]   . Metformin And Related     Nausea, diarrhea, malaise      OBJECTIVE:  Physical Exam  Vitals:   09/05/20 1300  BP: 123/65  Pulse: 76  Weight: 212 lb (96.2 kg)  Height: 5\' 3"  (1.6  m)   Body mass index is 37.55 kg/m. No exam data present  General: Obese pleasant middle-age female, seated, in no evident distress Head: head normocephalic and atraumatic.   Neck: supple with no carotid or supraclavicular bruits Cardiovascular: regular rate and rhythm, no murmurs Musculoskeletal: no deformity Skin:  no rash/petichiae Vascular:  Normal pulses all extremities   Neurologic Exam Mental Status: Awake and fully alert. Fluent speech and language. Oriented to place and time. Recent and remote memory intact. Attention span, concentration and fund of knowledge appropriate. Mood and affect appropriate.  Cranial Nerves: Fundoscopic exam reveals sharp disc margins. Pupils equal, briskly reactive to light. Extraocular movements full without nystagmus. Visual fields full to confrontation. Hearing intact. Facial sensation intact. Face, tongue, palate moves normally and symmetrically.  Motor: Normal bulk and tone. Normal strength in all tested extremity muscles. Sensory.: intact to touch , pinprick , position and vibratory sensation.  Coordination: Rapid alternating movements normal in all extremities. Finger-to-nose and heel-to-shin performed accurately bilaterally. Gait and Station: Arises from chair without difficulty. Stance is normal. Gait demonstrates  abnormal gait due to favoring of right leg.  Ambulates without assistive device. Reflexes: 1+ and symmetric. Toes downgoing.     NIHSS  0 Modified Rankin  2      ASSESSMENT/PLAN: [name] [name] is a 58 y.o. year old female presented with right-sided weakness/numbness, speech difficulty and headache on 07/12/2020 with stroke work-up revealing left thalamic lacunar infarct secondary to small vessel disease. Vascular risk factors include HTN, HLD, DM, former tobacco use, obesity and history of migraines.  Currently participating in sleep smart trial randomized to the CPAP treatment group.     1. L thalamic stroke :   a. Residual deficit: Subjective right-sided heaviness sensation, occasional RLE weakness with increased fatigue, increased fatigue and decreased activity tolerance.  Encouraged slowly returning back to prior activities as tolerated. R hip pain likely contributing.  b. Continue aspirin 81 mg daily  and atorvastatin 80 mg daily for secondary stroke prevention.  c. Discussed secondary stroke prevention measures and importance of close PCP follow up for aggressive stroke risk factor management  2. HTN: BP goal <130/90.  Stable today.  Currently diet controlled. 3. HLD: LDL goal <70. Recent LDL 191.  Increase of atorvastatin dosage from 40 mg to 80 mg during recent hospitalization.  Advised to continue and ensure repeat lipid panel with PCP in the next 1 to 2 months 4. DMII, uncontrolled: A1c goal<7.0. Recent A1c 11.6. On Jardiance and Lantus per PCP 5. R hip osteoarthritis: Continue to follow with orthopedics    Follow up in 3 months or call earlier if needed   CC:  [facility] provider: Dr. Elmon Else, Marcos Eke, MD    I spent 50 minutes of face-to-face and non-face-to-face time with patient and husband.  This included previsit chart review including recent hospitalization pertinent progress notes, labs and imaging, lab review, study review, order entry, electronic health record documentation, patient education regarding recent stroke and etiology, residual deficits, continued right hip pain, importance of managing stroke risk factors and answered all questions to patient and husband's satisfaction   Ihor Austin, AGNP-BC  [facility] [address] [place], [place] [address]  Phone [phone] Fax [phone] Note: This document was prepared with digital dictation and possible smart phrase technology. Any transcriptional errors that result from this process are unintentional.

## 2020-09-06 ENCOUNTER — Emergency Department (HOSPITAL_COMMUNITY)
Admission: EM | Admit: 2020-09-06 | Discharge: 2020-09-07 | Disposition: A | Discharge status: Home / Self Care | Payer: Medicare PPO | Attending: Emergency Medicine | Admitting: Emergency Medicine

## 2020-09-06 ENCOUNTER — Emergency Department (HOSPITAL_COMMUNITY): Payer: Medicare PPO

## 2020-09-06 ENCOUNTER — Other Ambulatory Visit: Payer: Self-pay

## 2020-09-06 DIAGNOSIS — Z7982 Long term (current) use of aspirin: Secondary | ICD-10-CM | POA: Diagnosis not present

## 2020-09-06 DIAGNOSIS — Y9301 Activity, walking, marching and hiking: Secondary | ICD-10-CM | POA: Diagnosis not present

## 2020-09-06 DIAGNOSIS — Z7984 Long term (current) use of oral hypoglycemic drugs: Secondary | ICD-10-CM | POA: Insufficient documentation

## 2020-09-06 DIAGNOSIS — S022XXA Fracture of nasal bones, initial encounter for closed fracture: Secondary | ICD-10-CM | POA: Diagnosis not present

## 2020-09-06 DIAGNOSIS — E1165 Type 2 diabetes mellitus with hyperglycemia: Secondary | ICD-10-CM | POA: Diagnosis not present

## 2020-09-06 DIAGNOSIS — W19XXXA Unspecified fall, initial encounter: Secondary | ICD-10-CM

## 2020-09-06 DIAGNOSIS — Z87891 Personal history of nicotine dependence: Secondary | ICD-10-CM | POA: Diagnosis not present

## 2020-09-06 DIAGNOSIS — E039 Hypothyroidism, unspecified: Secondary | ICD-10-CM | POA: Insufficient documentation

## 2020-09-06 DIAGNOSIS — Z8673 Personal history of transient ischemic attack (TIA), and cerebral infarction without residual deficits: Secondary | ICD-10-CM | POA: Insufficient documentation

## 2020-09-06 DIAGNOSIS — Z794 Long term (current) use of insulin: Secondary | ICD-10-CM | POA: Insufficient documentation

## 2020-09-06 DIAGNOSIS — S0992XA Unspecified injury of nose, initial encounter: Secondary | ICD-10-CM | POA: Diagnosis present

## 2020-09-06 DIAGNOSIS — S60512A Abrasion of left hand, initial encounter: Secondary | ICD-10-CM | POA: Diagnosis not present

## 2020-09-06 DIAGNOSIS — I10 Essential (primary) hypertension: Secondary | ICD-10-CM | POA: Diagnosis not present

## 2020-09-06 DIAGNOSIS — W01198A Fall on same level from slipping, tripping and stumbling with subsequent striking against other object, initial encounter: Secondary | ICD-10-CM | POA: Diagnosis not present

## 2020-09-06 DIAGNOSIS — R200 Anesthesia of skin: Secondary | ICD-10-CM

## 2020-09-06 DIAGNOSIS — R202 Paresthesia of skin: Secondary | ICD-10-CM | POA: Diagnosis not present

## 2020-09-06 LAB — CBC WITH DIFFERENTIAL/PLATELET
Abs Immature Granulocytes: 0.03 10*3/uL (ref 0.00–0.07)
Basophils Absolute: 0 10*3/uL (ref 0.0–0.1)
Basophils Relative: 0 %
Eosinophils Absolute: 0.1 10*3/uL (ref 0.0–0.5)
Eosinophils Relative: 1 %
HCT: 51 % — ABNORMAL HIGH (ref 36.0–46.0)
Hemoglobin: 16.7 g/dL — ABNORMAL HIGH (ref 12.0–15.0)
Immature Granulocytes: 0 %
Lymphocytes Relative: 39 %
Lymphs Abs: 3.6 10*3/uL (ref 0.7–4.0)
MCH: 30.8 pg (ref 26.0–34.0)
MCHC: 32.7 g/dL (ref 30.0–36.0)
MCV: 94.1 fL (ref 80.0–100.0)
Monocytes Absolute: 0.6 10*3/uL (ref 0.1–1.0)
Monocytes Relative: 6 %
Neutro Abs: 5 10*3/uL (ref 1.7–7.7)
Neutrophils Relative %: 54 %
Platelets: UNDETERMINED 10*3/uL (ref 150–400)
RBC: 5.42 MIL/uL — ABNORMAL HIGH (ref 3.87–5.11)
RDW: 12.8 % (ref 11.5–15.5)
WBC: 9.3 10*3/uL (ref 4.0–10.5)
nRBC: 0 % (ref 0.0–0.2)

## 2020-09-06 IMAGING — CT CT HEAD W/O CM
3 of 4 series · 13 of 47 positions shown, 15 images · non-contrast
Comparison: MRI [DATE], CT perfusion with angiography of the
head and neck [DATE]

CLINICAL DATA: Mechanical fall, nasal bridge abrasion

EXAM:
CT HEAD WITHOUT CONTRAST
CT MAXILLOFACIAL WITHOUT CONTRAST
CT CERVICAL SPINE WITHOUT CONTRAST
TECHNIQUE: Multidetector CT imaging of the head, cervical spine, and
maxillofacial structures were performed using the standard protocol
without intravenous contrast. Multiplanar CT image reconstructions
of the cervical spine and maxillofacial structures were also
generated.

[Series 3: head wo · axial · 0.40mm/px · z∈[+1222,+1342]mm · 7 of 32 slices shown, 9 images]
[im 4/32  brain]
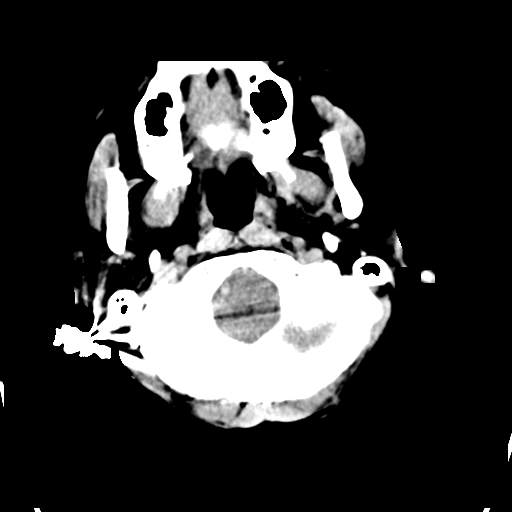
[im 4/32  bone]
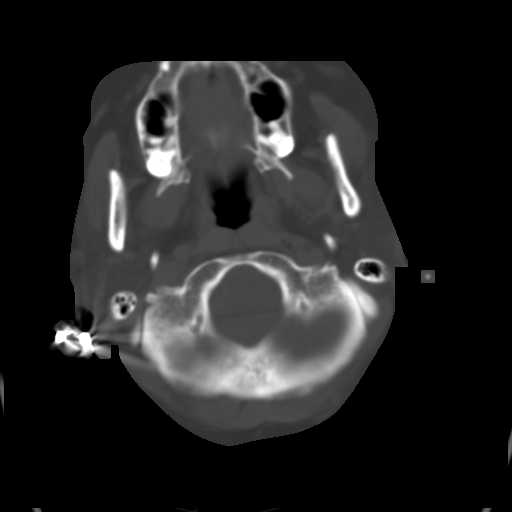
[im 8/32  brain]
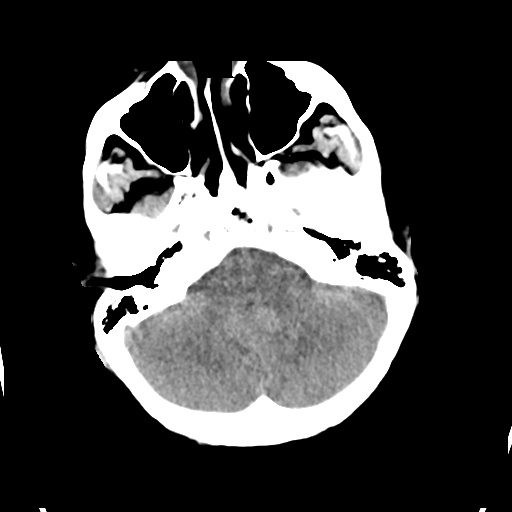
[im 12/32  brain]
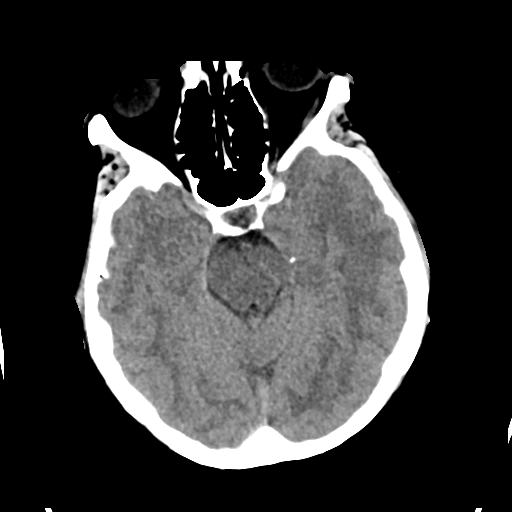
[im 16/32  brain]
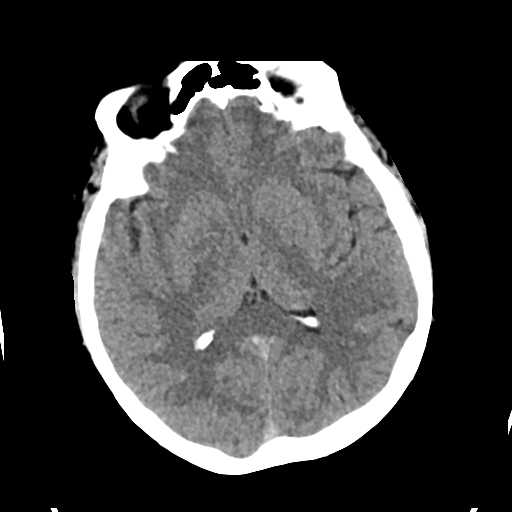
[im 20/32  brain]
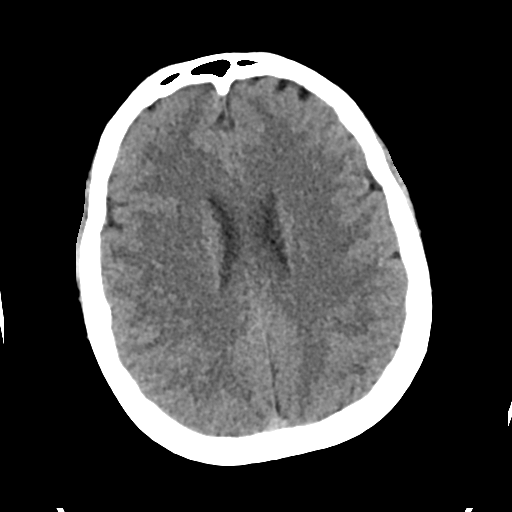
[im 20/32  bone]
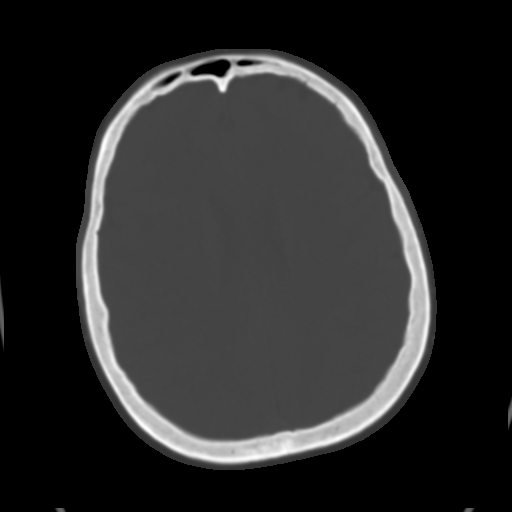
[im 24/32  brain]
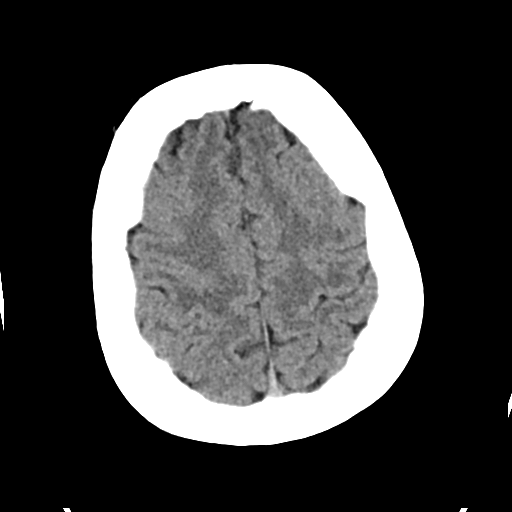
[im 28/32  brain]
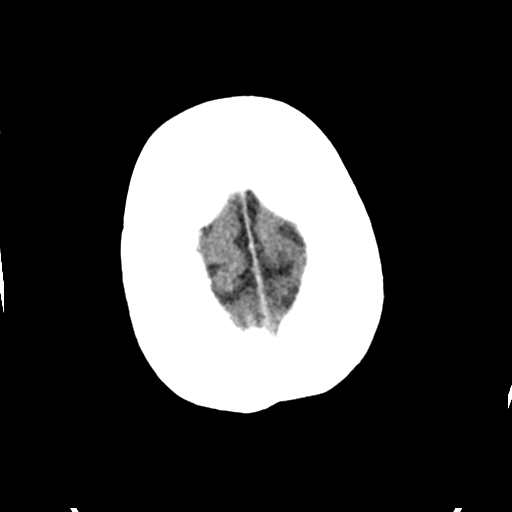

[Series 5: cor soft · coronal · 0.32mm/px · 3 of 62 slices shown]
[im 21/62  brain]
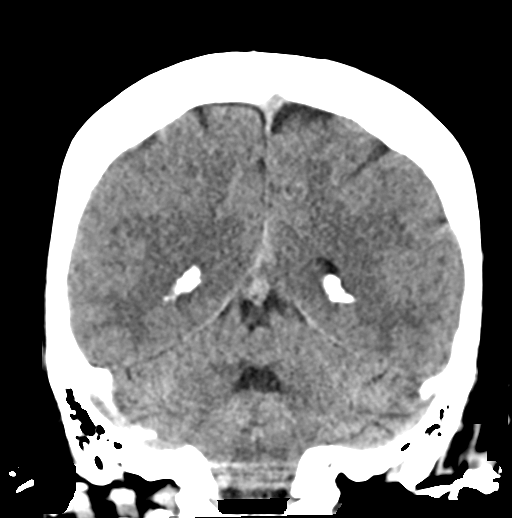
[im 28/62  brain]
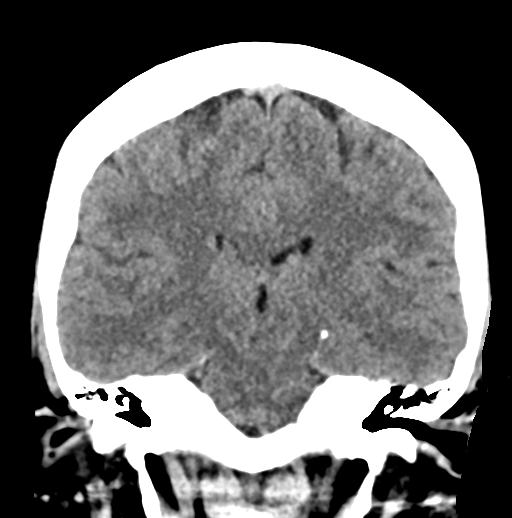
[im 34/62  brain]
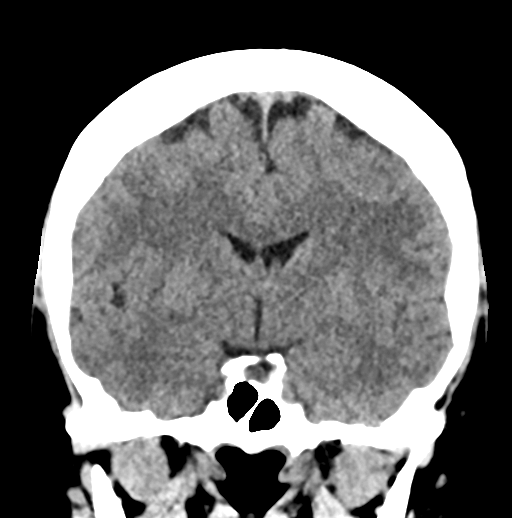

[Series 6: sag soft · sagittal · 0.35mm/px · 3 of 53 slices shown]
[im 18/53  brain]
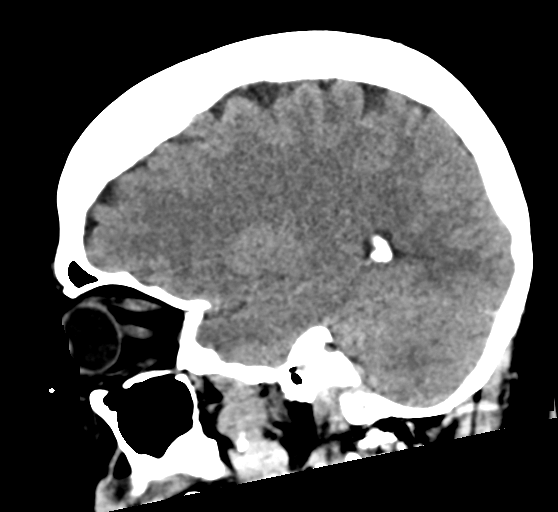
[im 27/53  brain]
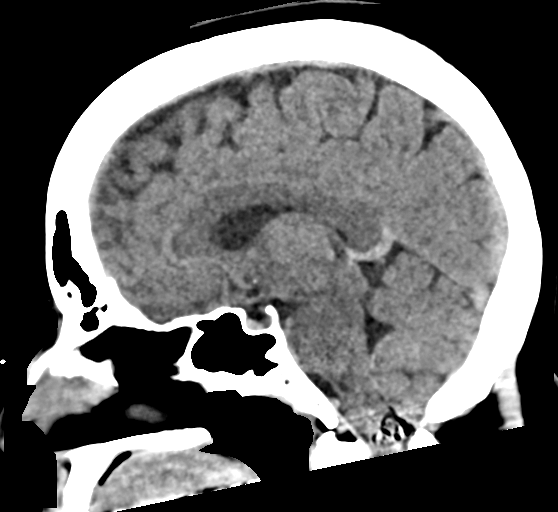
[im 35/53  brain]
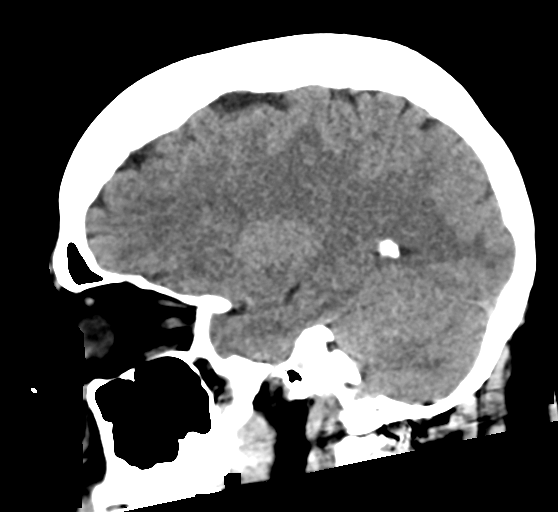

[13 of 47 positions shown; findings below may reference images not displayed]

FINDINGS: CT HEAD FINDINGS

Brain: Expected chronic evolution of a previously identified small
left thalamic infarct. No evidence of acute infarction, hemorrhage,
hydrocephalus, extra-axial collection, visible mass lesion or mass
effect. Basal cisterns are patent. Midline intracranial structures
are unremarkable. Stable benign dural calcifications.

Vascular: Few calcifications in the carotid siphons. No unexpected
calcification or acute vascular abnormality.

Skull: No large scalp hematoma or significant swelling. No calvarial
fracture.

Other: None

CT MAXILLOFACIAL FINDINGS

Osseous: Minimally displaced fractures of the bilateral nasal bones
bilaterally with overlying soft tissue swelling and laceration. No
fracture of the bony orbits. No other mid face fractures are seen.
The pterygoid plates are intact. No visible or suspected temporal
bone fractures. Temporomandibular joints are normally aligned with
minimal arthrosis. The mandible is intact. No acutely fractured or
avulsed teeth. Multitude of chronically absent dentition as well as
impacted bilateral third maxillary and right third mandibular
molars.

Orbits: No retro septal gas, stranding or hemorrhage. The globes
appear normal and symmetric. Symmetric appearance of the extraocular
musculature and optic nerve sheath complexes. Normal caliber of the
superior ophthalmic veins.

Sinuses: Paranasal sinuses and mastoid air cells are predominantly
clear. Bilateral concha bullosa slight rightward nasal septal
deviation with a small right-sided nasal septal spur. Middle ear
cavities are clear. Ossicular chains are normally configured.

Soft tissues: Soft tissue thickening and swelling across the nasal
bridge with overlying dermal laceration. No soft tissue gas or
foreign body. No other significant soft tissue swelling. No
radiopaque foreign body or soft tissue gas.

CT CERVICAL SPINE FINDINGS

Alignment: A cervical stabilization collar is in place at the time
of examination. Some mild straightening of the upper cervical
lordosis is similar to the comparison CT angiography from [facility]. No
evidence of traumatic listhesis. No abnormally widened, perched or
jumped facets. Normal alignment of the craniocervical and
atlantoaxial articulations accounting for some mild rightward
cranial rotation.

Skull base and vertebrae: No acute skull base fracture. No vertebral
body fracture or height loss. Normal bone mineralization. No
worrisome osseous lesions. Mild atlantodental arthrosis. Some early
spondylitic changes are present as well, further detailed below.

Soft tissues and spinal canal: No pre or paravertebral fluid or
swelling. No visible canal hematoma.

Disc levels: Mild multilevel intervertebral disc height loss with
some early spondylitic endplate changes with mild uncinate spurring
and facet arthropathy. Some early disc desiccation noted C5-6, C6-7
as well. No significant central canal or foraminal stenosis
identified within the imaged levels of the spine.

Upper chest: No acute abnormality in the upper chest or imaged lung
apices. Calcifications seen in the proximal subclavian arteries as
well as throughout the cervical carotids predominantly at the
bifurcations.

Other: Thyroid gland is unremarkable.
IMPRESSION: 1. No acute intracranial abnormality.
2. Expected evolution of a previously identified remote left
thalamic infarct.
3. Minimally displaced fractures of the bilateral nasal bones with
overlying soft tissue swelling and laceration.
4. No other acute facial bone fracture.
5. No acute fracture or traumatic listhesis of the cervical spine.
6. Some early spondylitic changes without significant central canal
or foraminal stenosis.
7. Multitude of chronically absent dentition as well as impacted
bilateral third maxillary and right third mandibular molars.
8. Cervical and intracranial atherosclerosis.

## 2020-09-06 IMAGING — CT CT MAXILLOFACIAL W/O CM
3 of 6 series · 13 of 47 positions shown, 15 images · non-contrast
Comparison: MRI [DATE], CT perfusion with angiography of the
head and neck [DATE]

CLINICAL DATA: Mechanical fall, nasal bridge abrasion

EXAM:
CT HEAD WITHOUT CONTRAST
CT MAXILLOFACIAL WITHOUT CONTRAST
CT CERVICAL SPINE WITHOUT CONTRAST
TECHNIQUE: Multidetector CT imaging of the head, cervical spine, and
maxillofacial structures were performed using the standard protocol
without intravenous contrast. Multiplanar CT image reconstructions
of the cervical spine and maxillofacial structures were also
generated.

[Series 3: maxilllofacial 2.0 hr40 3 · axial · 0.34mm/px · z∈[+1160,+1304]mm · 8 of 85 slices shown, 10 images]
[im 7/85  brain]
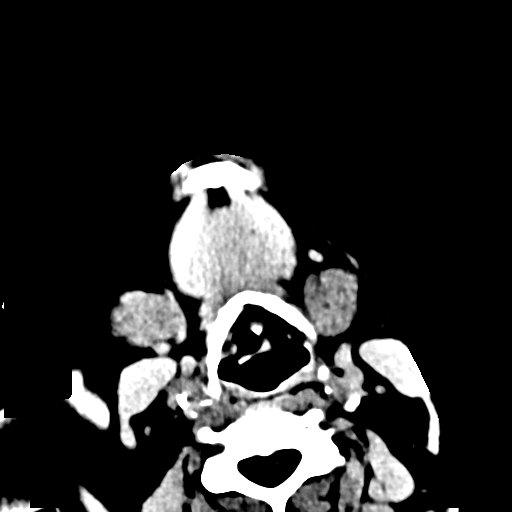
[im 7/85  bone]
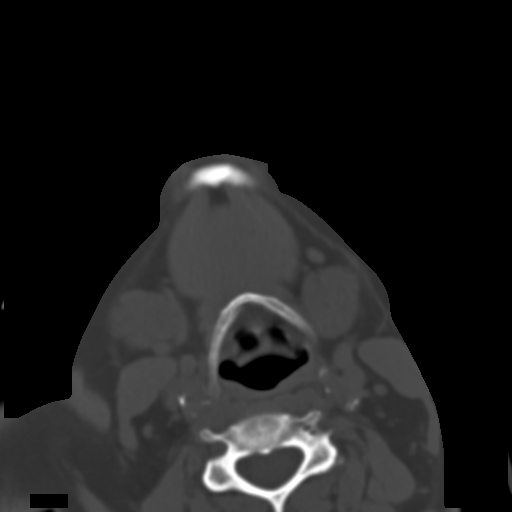
[im 19/85  bone]
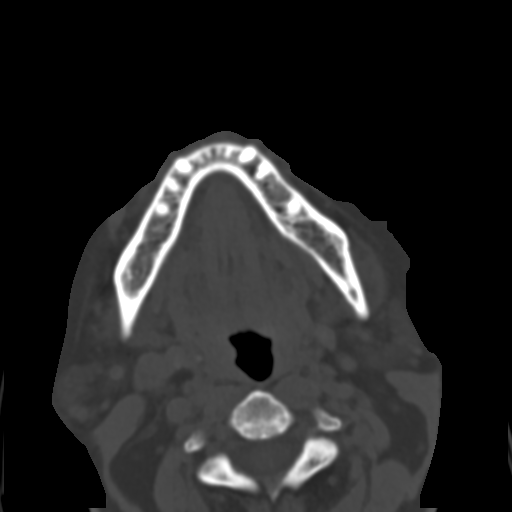
[im 31/85  bone]
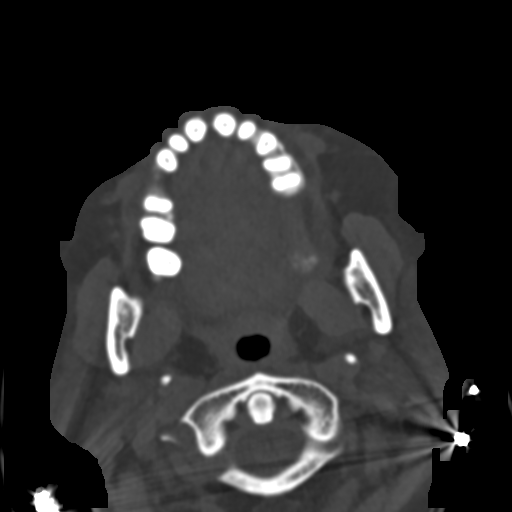
[im 37/85  bone]
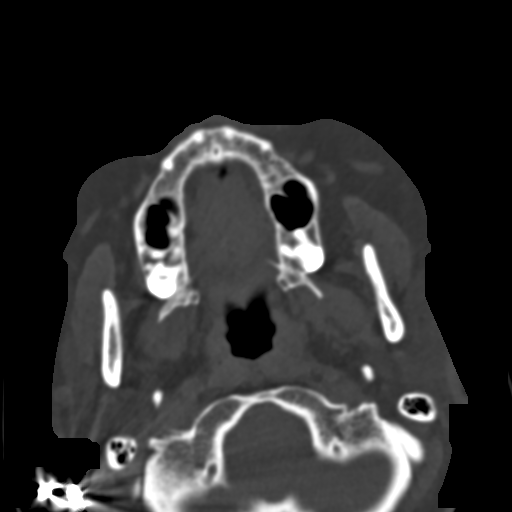
[im 49/85  brain]
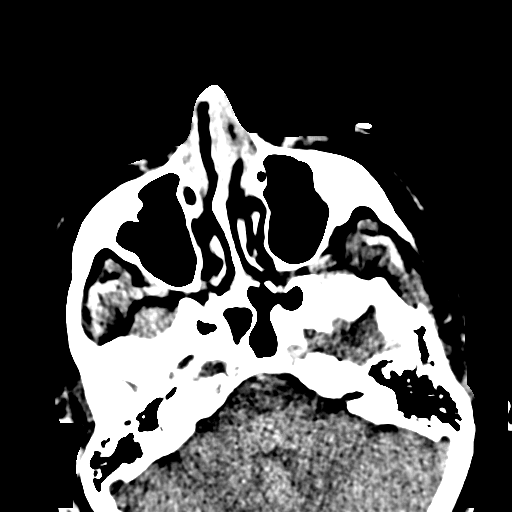
[im 49/85  bone]
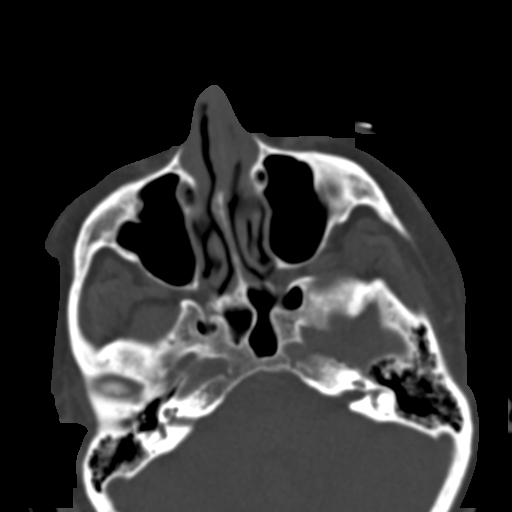
[im 55/85  bone]
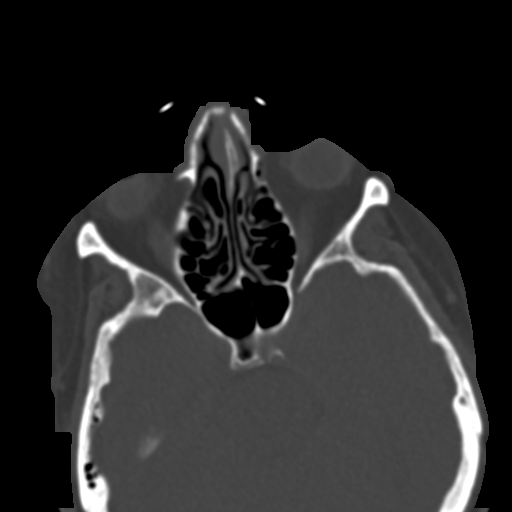
[im 67/85  bone]
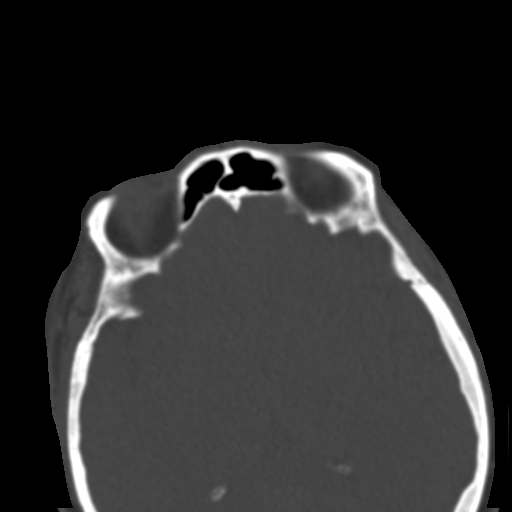
[im 79/85  bone]
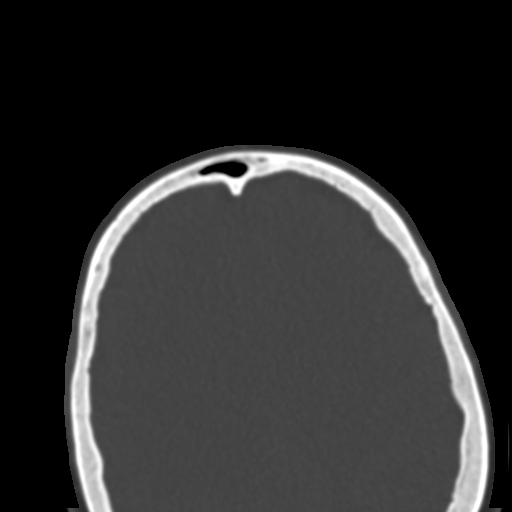

[Series 7: st cor · coronal · 0.34mm/px · 3 of 74 slices shown]
[im 19/74  bone]
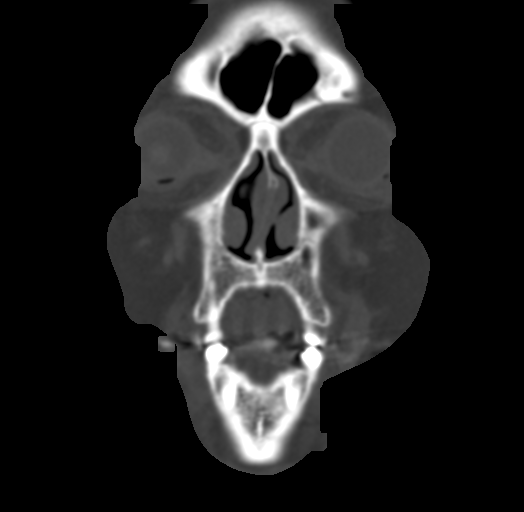
[im 37/74  bone]
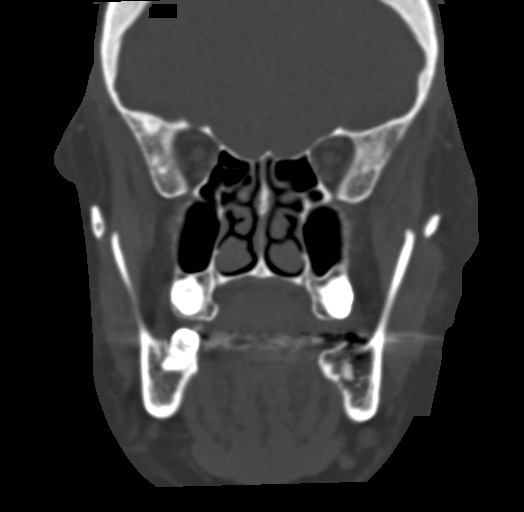
[im 55/74  bone]
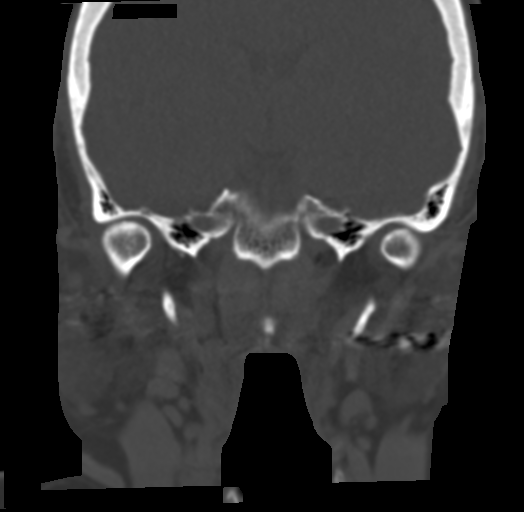

[Series 10: bone sag · sagittal · 0.28mm/px · 2 of 88 slices shown]
[im 30/88  bone]
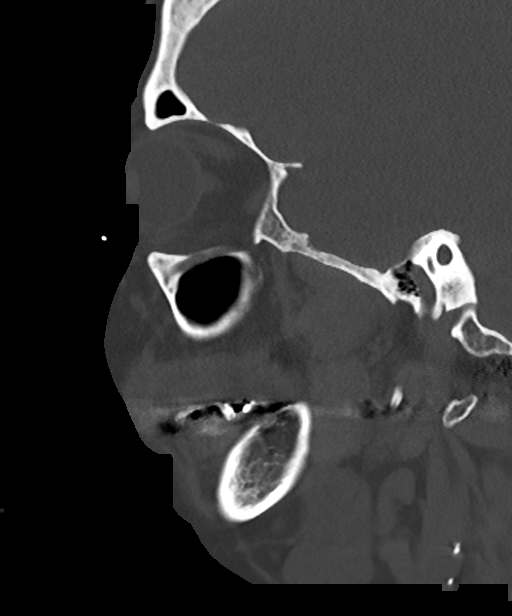
[im 59/88  bone]
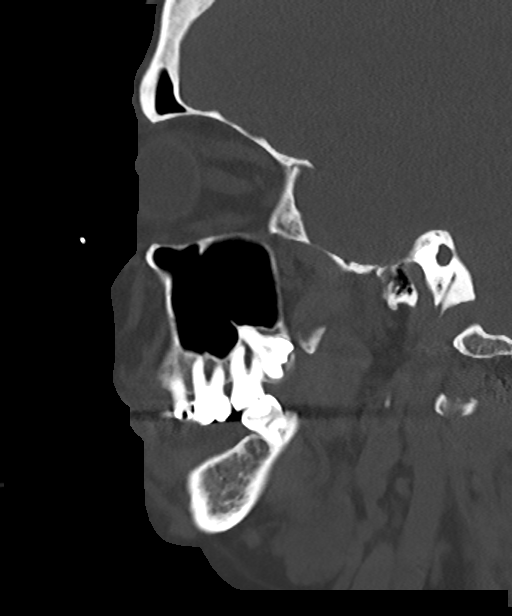

[13 of 47 positions shown; findings below may reference images not displayed]

FINDINGS: CT HEAD FINDINGS

Brain: Expected chronic evolution of a previously identified small
left thalamic infarct. No evidence of acute infarction, hemorrhage,
hydrocephalus, extra-axial collection, visible mass lesion or mass
effect. Basal cisterns are patent. Midline intracranial structures
are unremarkable. Stable benign dural calcifications.

Vascular: Few calcifications in the carotid siphons. No unexpected
calcification or acute vascular abnormality.

Skull: No large scalp hematoma or significant swelling. No calvarial
fracture.

Other: None

CT MAXILLOFACIAL FINDINGS

Osseous: Minimally displaced fractures of the bilateral nasal bones
bilaterally with overlying soft tissue swelling and laceration. No
fracture of the bony orbits. No other mid face fractures are seen.
The pterygoid plates are intact. No visible or suspected temporal
bone fractures. Temporomandibular joints are normally aligned with
minimal arthrosis. The mandible is intact. No acutely fractured or
avulsed teeth. Multitude of chronically absent dentition as well as
impacted bilateral third maxillary and right third mandibular
molars.

Orbits: No retro septal gas, stranding or hemorrhage. The globes
appear normal and symmetric. Symmetric appearance of the extraocular
musculature and optic nerve sheath complexes. Normal caliber of the
superior ophthalmic veins.

Sinuses: Paranasal sinuses and mastoid air cells are predominantly
clear. Bilateral concha bullosa slight rightward nasal septal
deviation with a small right-sided nasal septal spur. Middle ear
cavities are clear. Ossicular chains are normally configured.

Soft tissues: Soft tissue thickening and swelling across the nasal
bridge with overlying dermal laceration. No soft tissue gas or
foreign body. No other significant soft tissue swelling. No
radiopaque foreign body or soft tissue gas.

CT CERVICAL SPINE FINDINGS

Alignment: A cervical stabilization collar is in place at the time
of examination. Some mild straightening of the upper cervical
lordosis is similar to the comparison CT angiography from [facility]. No
evidence of traumatic listhesis. No abnormally widened, perched or
jumped facets. Normal alignment of the craniocervical and
atlantoaxial articulations accounting for some mild rightward
cranial rotation.

Skull base and vertebrae: No acute skull base fracture. No vertebral
body fracture or height loss. Normal bone mineralization. No
worrisome osseous lesions. Mild atlantodental arthrosis. Some early
spondylitic changes are present as well, further detailed below.

Soft tissues and spinal canal: No pre or paravertebral fluid or
swelling. No visible canal hematoma.

Disc levels: Mild multilevel intervertebral disc height loss with
some early spondylitic endplate changes with mild uncinate spurring
and facet arthropathy. Some early disc desiccation noted C5-6, C6-7
as well. No significant central canal or foraminal stenosis
identified within the imaged levels of the spine.

Upper chest: No acute abnormality in the upper chest or imaged lung
apices. Calcifications seen in the proximal subclavian arteries as
well as throughout the cervical carotids predominantly at the
bifurcations.

Other: Thyroid gland is unremarkable.
IMPRESSION: 1. No acute intracranial abnormality.
2. Expected evolution of a previously identified remote left
thalamic infarct.
3. Minimally displaced fractures of the bilateral nasal bones with
overlying soft tissue swelling and laceration.
4. No other acute facial bone fracture.
5. No acute fracture or traumatic listhesis of the cervical spine.
6. Some early spondylitic changes without significant central canal
or foraminal stenosis.
7. Multitude of chronically absent dentition as well as impacted
bilateral third maxillary and right third mandibular molars.
8. Cervical and intracranial atherosclerosis.

## 2020-09-06 IMAGING — CT CT CERVICAL SPINE W/O CM
3 of 4 series · 11 of 33 positions shown, 13 images · non-contrast
Comparison: MRI [DATE], CT perfusion with angiography of the
head and neck [DATE]

CLINICAL DATA: Mechanical fall, nasal bridge abrasion

EXAM:
CT HEAD WITHOUT CONTRAST
CT MAXILLOFACIAL WITHOUT CONTRAST
CT CERVICAL SPINE WITHOUT CONTRAST
TECHNIQUE: Multidetector CT imaging of the head, cervical spine, and
maxillofacial structures were performed using the standard protocol
without intravenous contrast. Multiplanar CT image reconstructions
of the cervical spine and maxillofacial structures were also
generated.

[Series 8: sag bone · sagittal · 0.28mm/px · 5 of 89 slices shown, 6 images]
[im 30/89  bone]
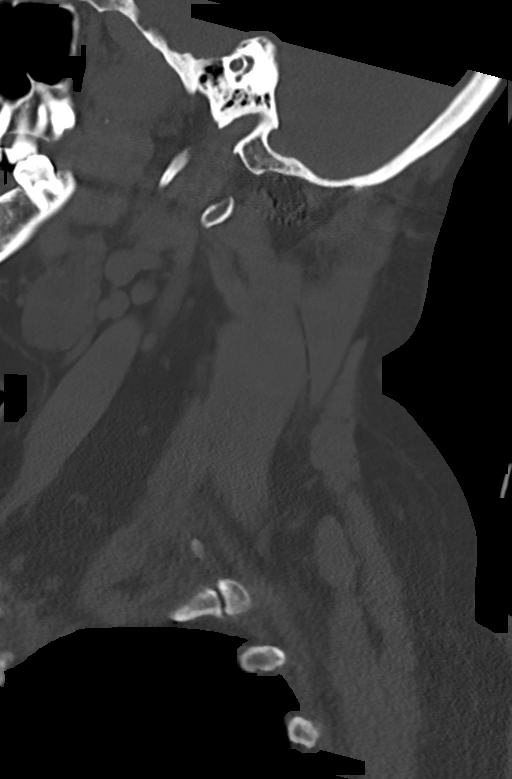
[im 37/89  bone]
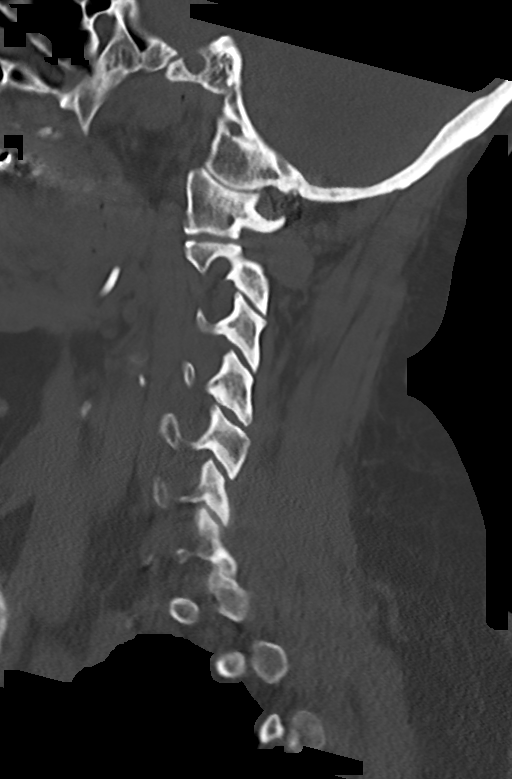
[im 45/89  soft-tissue]
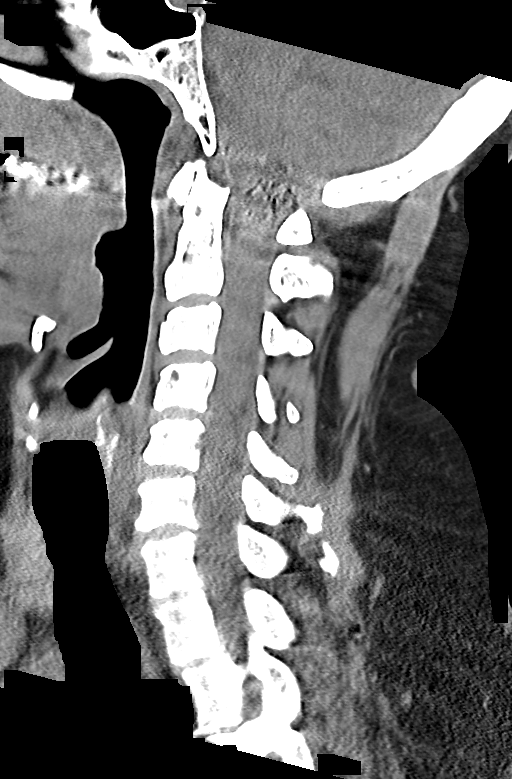
[im 45/89  bone]
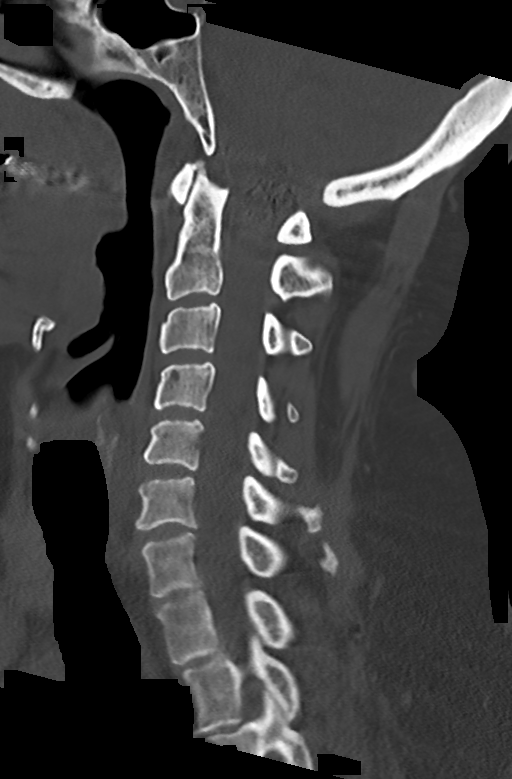
[im 52/89  bone]
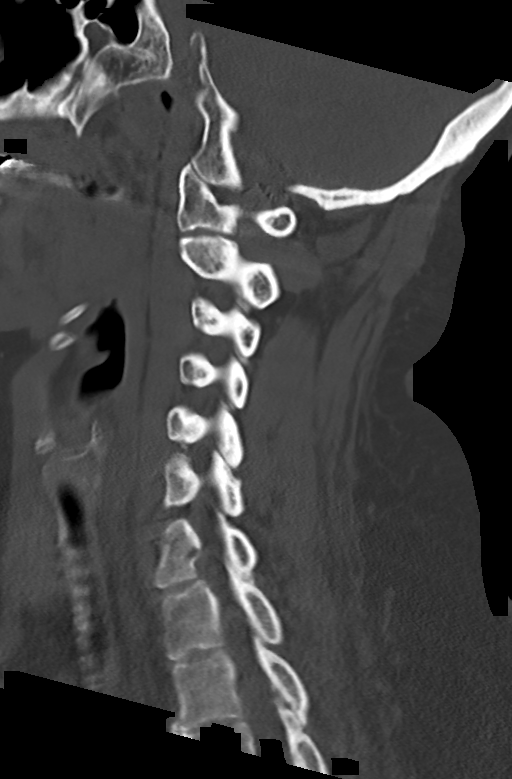
[im 59/89  bone]
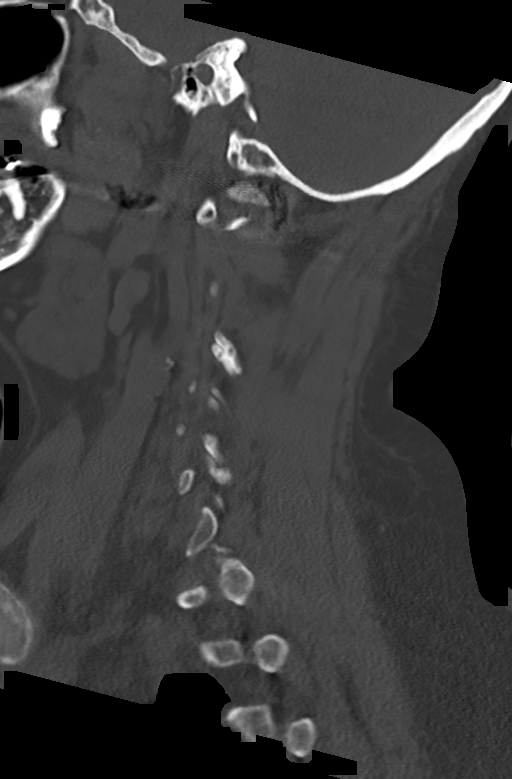

[Series 9: cor bone · coronal · 0.29mm/px · 3 of 67 slices shown]
[im 14/67  bone]
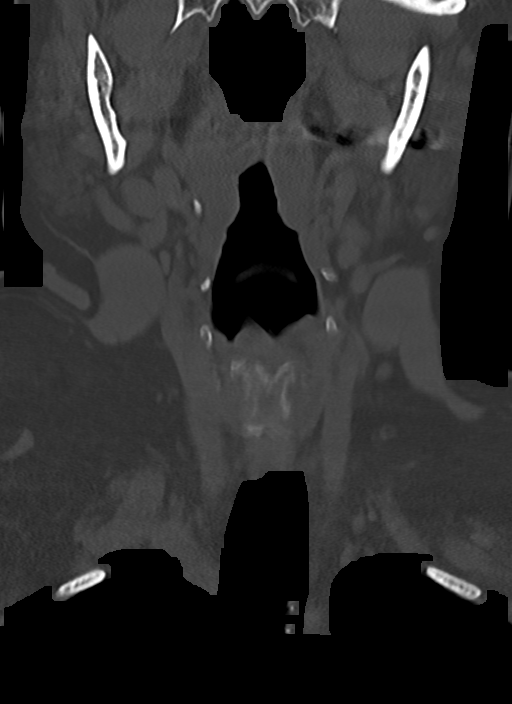
[im 27/67  bone]
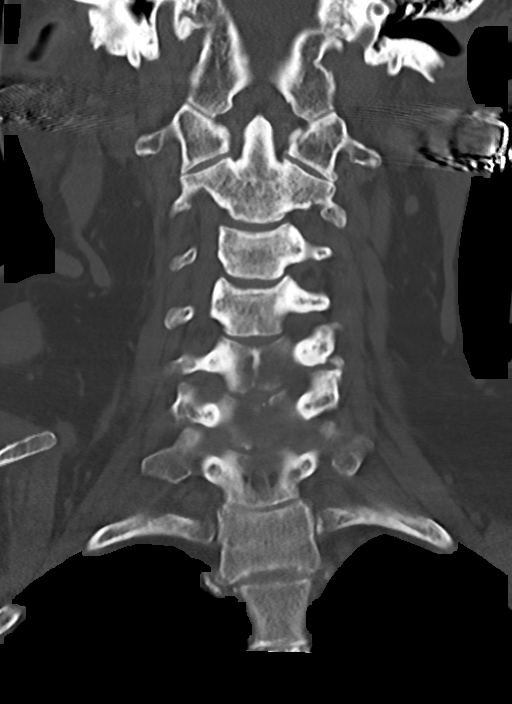
[im 40/67  bone]
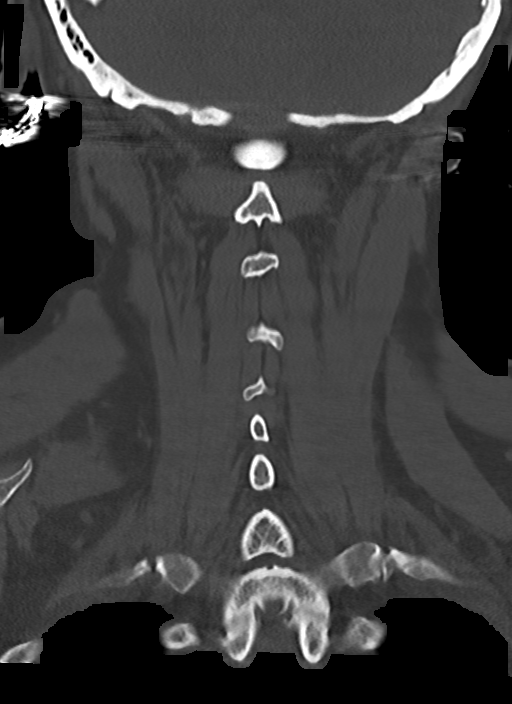

[Series 10: orthogonal axials · axial · 0.21mm/px · z∈[+1070,+1188]mm · 3 of 91 slices shown, 4 images]
[im 16/91  soft-tissue]
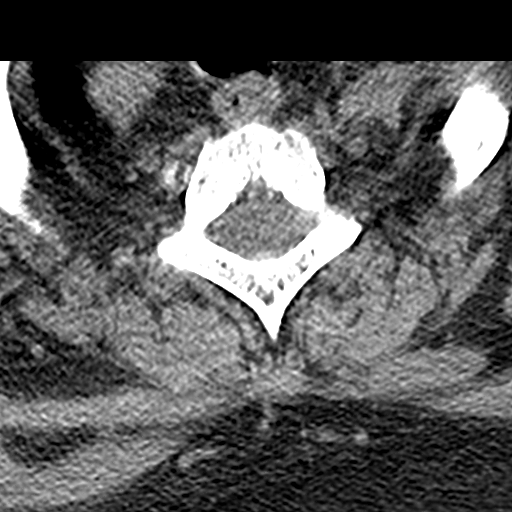
[im 16/91  bone]
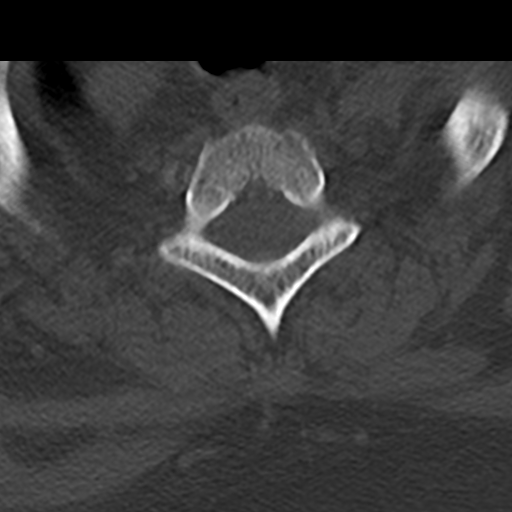
[im 46/91  bone]
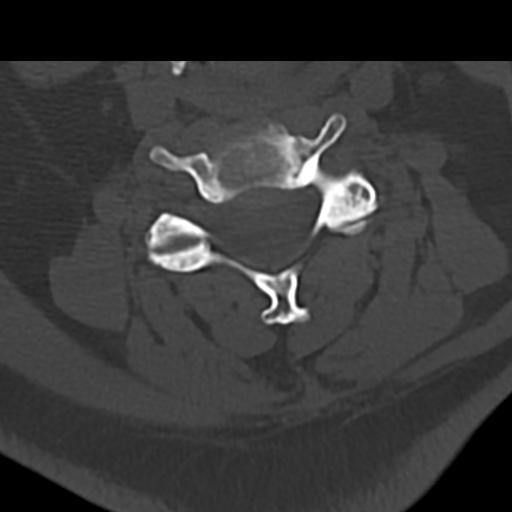
[im 76/91  bone]
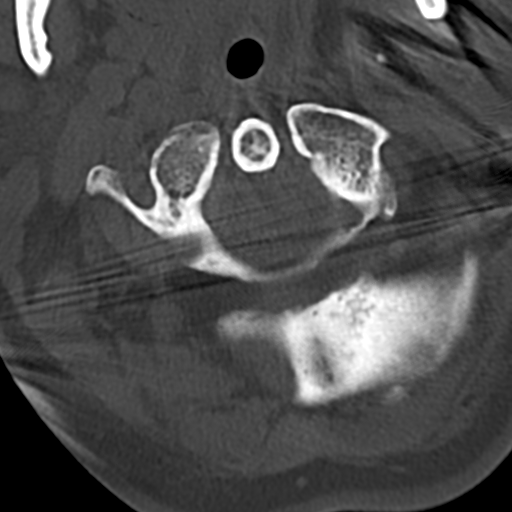

[11 of 33 positions shown; findings below may reference images not displayed]

FINDINGS: CT HEAD FINDINGS

Brain: Expected chronic evolution of a previously identified small
left thalamic infarct. No evidence of acute infarction, hemorrhage,
hydrocephalus, extra-axial collection, visible mass lesion or mass
effect. Basal cisterns are patent. Midline intracranial structures
are unremarkable. Stable benign dural calcifications.

Vascular: Few calcifications in the carotid siphons. No unexpected
calcification or acute vascular abnormality.

Skull: No large scalp hematoma or significant swelling. No calvarial
fracture.

Other: None

CT MAXILLOFACIAL FINDINGS

Osseous: Minimally displaced fractures of the bilateral nasal bones
bilaterally with overlying soft tissue swelling and laceration. No
fracture of the bony orbits. No other mid face fractures are seen.
The pterygoid plates are intact. No visible or suspected temporal
bone fractures. Temporomandibular joints are normally aligned with
minimal arthrosis. The mandible is intact. No acutely fractured or
avulsed teeth. Multitude of chronically absent dentition as well as
impacted bilateral third maxillary and right third mandibular
molars.

Orbits: No retro septal gas, stranding or hemorrhage. The globes
appear normal and symmetric. Symmetric appearance of the extraocular
musculature and optic nerve sheath complexes. Normal caliber of the
superior ophthalmic veins.

Sinuses: Paranasal sinuses and mastoid air cells are predominantly
clear. Bilateral concha bullosa slight rightward nasal septal
deviation with a small right-sided nasal septal spur. Middle ear
cavities are clear. Ossicular chains are normally configured.

Soft tissues: Soft tissue thickening and swelling across the nasal
bridge with overlying dermal laceration. No soft tissue gas or
foreign body. No other significant soft tissue swelling. No
radiopaque foreign body or soft tissue gas.

CT CERVICAL SPINE FINDINGS

Alignment: A cervical stabilization collar is in place at the time
of examination. Some mild straightening of the upper cervical
lordosis is similar to the comparison CT angiography from [facility]. No
evidence of traumatic listhesis. No abnormally widened, perched or
jumped facets. Normal alignment of the craniocervical and
atlantoaxial articulations accounting for some mild rightward
cranial rotation.

Skull base and vertebrae: No acute skull base fracture. No vertebral
body fracture or height loss. Normal bone mineralization. No
worrisome osseous lesions. Mild atlantodental arthrosis. Some early
spondylitic changes are present as well, further detailed below.

Soft tissues and spinal canal: No pre or paravertebral fluid or
swelling. No visible canal hematoma.

Disc levels: Mild multilevel intervertebral disc height loss with
some early spondylitic endplate changes with mild uncinate spurring
and facet arthropathy. Some early disc desiccation noted C5-6, C6-7
as well. No significant central canal or foraminal stenosis
identified within the imaged levels of the spine.

Upper chest: No acute abnormality in the upper chest or imaged lung
apices. Calcifications seen in the proximal subclavian arteries as
well as throughout the cervical carotids predominantly at the
bifurcations.

Other: Thyroid gland is unremarkable.
IMPRESSION: 1. No acute intracranial abnormality.
2. Expected evolution of a previously identified remote left
thalamic infarct.
3. Minimally displaced fractures of the bilateral nasal bones with
overlying soft tissue swelling and laceration.
4. No other acute facial bone fracture.
5. No acute fracture or traumatic listhesis of the cervical spine.
6. Some early spondylitic changes without significant central canal
or foraminal stenosis.
7. Multitude of chronically absent dentition as well as impacted
bilateral third maxillary and right third mandibular molars.
8. Cervical and intracranial atherosclerosis.

## 2020-09-06 NOTE — ED Notes (Addendum)
Patient bib [place] for mechanical fall. Patient has small abrasion to the nasal bridge and left knee. Unknown loc no thinners. Patient hypotensive when squad arrived but has since resolved. Patient has history of stroke in august, but has no residual. 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 Patient cincy negative for ems. Patient complaining of rs numbness. Squad also describbed the patient to have right sided tremor and facial twitching that has resolved.

## 2020-09-06 NOTE — ED Provider Notes (Signed)
[facility] EMERGENCY DEPARTMENT Provider Note   CSN: [id number] Arrival date & time: 09/06/20  2209     History Chief Complaint  Patient presents with  . Fall  . Dizziness  . Facial Injury    [name] [name] is a 59 y.o. female.  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  Reports that since her stroke, she has been having waxing and waning paresthesias on the right but has not had them recently.  She had discussed with her neurologist recently that this was expected with her stroke.  Reports she is not having any other symptoms over the last week, but today lost her balance while she was walking her dog and fell flat on her face.  She had no loss of consciousness.  Denies neck pain.  Reports right-sided face, arm and leg tingling paresthesias.  Reports he also had an episode of shaking of the right side of her face and right arm that resolved.  Reports some lightheadedness since the fall.  Has not walked a lots of the abdomen but was able to ambulate some.  Has scrapes on her hand and is up-to-date on her tetanus shot.  Denies any significant hand pain.  Reports mild knee pain but no significant symptoms.  Denies chest pain, abdominal pain, other extremity pain.  Past Medical History:  Diagnosis Date  . Chickenpox   . Complication of anesthesia    anxiety and panic  . Diabetes mellitus without complication ([place])    diet controlled, no longer needs meds  . Elevated liver enzymes    ABD US done 08/2016: fatty liver  . Fatty liver    ABD US done 08/2016: fatty liver  . Hyperlipidemia    no longer needs meds  . Hypertension    pt states no longer needs meds  . Hypothyroidism   . OA (osteoarthritis) of hip    right  . Schizophrenia ([place])     . Thyroid disease    no meds    Patient Active Problem List   Diagnosis Date Noted  . Osteoarthritis of hip 07/28/2020  . Transaminitis   . Erythrocytosis   . Uncontrolled type 2 diabetes mellitus with hyperglycemia, with long-term current use of insulin ([place])   . Thalamic stroke ([place]) 07/19/2020  . Schizoaffective disorder ([place]) 07/14/2020  . Acute ischemic stroke ([place]) 07/13/2020  . Dog bite of face 08/13/2018  . Dog bite of ear 08/13/2018  . Diabetes mellitus without complication ([place])   . Mood disorder ([place]) 11/11/2017  . Type 2 diabetes mellitus without complication, without long-term current use of insulin ([place]) 11/11/2017  . Class 2 obesity with body mass index (BMI) of 37.0 to 37.9 in adult 11/11/2017  . Mixed hyperlipidemia 11/11/2017  . Tinea corporis 11/08/2017    Past Surgical History:  Procedure Laterality Date  . BREAST BIOPSY     needle bx more than 15 yrs benign, no scar visible   . LACERATION REPAIR N/A 12/13/2017   Procedure: Closure of nasal lacerations with one rotator graft and one advanced flap with skin graft, closure of left nasal laceration, closure of left ear.;  Surgeon: [name], MD;  Location: [facility] ORS;  Service: ENT;  Laterality: N/A;     OB History   No obstetric history on  file.     Family History  Problem Relation Age of Onset  . Alcohol abuse Mother   . Arthritis Mother   . Diabetes Mother   . Hypertension Mother   . Hyperlipidemia Mother   . Alcohol abuse Father   . Arthritis Father   . Asthma Father   . Heart disease Father   . Hypertension Father   . Hyperlipidemia Father   . Alcohol abuse Brother   . Arthritis Brother   . Drug abuse Brother     Social History   Tobacco Use  . Smoking status: Former Smoker    Quit date: 2004    Years since quitting: 17.8  . Smokeless tobacco: Never Used  Vaping Use  . Vaping Use: Never used  Substance Use Topics  . Alcohol use: Yes    Alcohol/week: 3.0 standard drinks    Types: 1  Glasses of wine, 1 Cans of beer, 1 Shots of liquor per week    Comment: social once month  . Drug use: No    Comment: THC edible 10mg     Home Medications Prior to Admission medications   Medication Sig Start Date End Date Taking? Authorizing Provider  acetaminophen (TYLENOL) 325 MG tablet Take 1-2 tablets (325-650 mg total) by mouth every 4 (four) hours as needed for mild pain. 07/26/20   Love, Ivan Anchors, PA-C  aspirin 81 MG EC tablet Take 1 tablet (81 mg total) by mouth daily. Swallow whole. 07/28/20   Love, Ivan Anchors, PA-C  atorvastatin (LIPITOR) 80 MG tablet Take 1 tablet (80 mg total) by mouth daily. 07/28/20   Love, Ivan Anchors, PA-C  empagliflozin (JARDIANCE) 25 MG TABS tablet Take by mouth daily.    [provider]  insulin glargine (LANTUS) 100 UNIT/ML Solostar Pen Inject 21 Units into the skin daily. 07/28/20   Love, Ivan Anchors, PA-C  Insulin Pen Needle (ADVOCATE INSULIN PEN NEEDLES) 31G X 5 MM MISC 1 application by Does not apply route daily. 07/28/20   Love, Ivan Anchors, PA-C    Allergies    Citric acid, Dairy aid [lactase], and Metformin and related  Review of Systems   Review of Systems  Constitutional: Negative for fever.  HENT: Negative for sore throat.   Eyes: Negative for visual disturbance.  Respiratory: Negative for cough and shortness of breath.   Cardiovascular: Negative for chest pain.  Gastrointestinal: Positive for nausea. Negative for abdominal pain and vomiting.  Genitourinary: Negative for difficulty urinating.  Musculoskeletal: Negative for back pain and neck pain.  Skin: Positive for wound. Negative for rash.  Neurological: Positive for light-headedness, numbness and headaches. Negative for syncope, facial asymmetry, speech difficulty and weakness.    Physical Exam Updated Vital Signs BP (!) 142/64 (BP Location: Right Arm)   Pulse 86   Temp 98.6 F (37 C) (Oral)   Resp 16   Ht 5\' 3"  (1.6 m)   Wt 94.8 kg   SpO2 98%   BMI 37.02 kg/m   Physical  Exam Vitals and nursing note reviewed.  Constitutional:      General: She is not in acute distress.    Appearance: She is well-developed. She is not diaphoretic.  HENT:     Head: Normocephalic and atraumatic.  Eyes:     General: No visual field deficit.    Conjunctiva/sclera: Conjunctivae normal.  Cardiovascular:     Rate and Rhythm: Normal rate and regular rhythm.     Heart sounds: Normal heart sounds. No murmur heard.  No  friction rub. No gallop.   Pulmonary:     Effort: Pulmonary effort is normal. No respiratory distress.     Breath sounds: Normal breath sounds. No wheezing or rales.  Abdominal:     General: There is no distension.     Palpations: Abdomen is soft.     Tenderness: There is no abdominal tenderness. There is no guarding.  Musculoskeletal:        General: No tenderness.     Cervical back: Normal range of motion.     Comments: Abrasion to left palm, no significant hand tenderness  Skin:    General: Skin is warm and dry.     Findings: No erythema or rash.  Neurological:     Mental Status: She is alert and oriented to person, place, and time.     Cranial Nerves: No cranial nerve deficit, dysarthria or facial asymmetry.     Sensory: Sensory deficit (right face, arm, leg) present.     Motor: Motor function is intact. No weakness.     Coordination: Abnormal coordination: tremor when using right arm.     ED Results / Procedures / Treatments   Labs (all labs ordered are listed, but only abnormal results are displayed) Labs Reviewed  CBC WITH DIFFERENTIAL/PLATELET - Abnormal; Notable for the following components:      Result Value   RBC 5.42 (*)    Hemoglobin 16.7 (*)    HCT 51.0 (*)    All other components within normal limits  COMPREHENSIVE METABOLIC PANEL - Abnormal; Notable for the following components:   Potassium 5.8 (*)    Glucose, Bld 103 (*)    AST 59 (*)    All other components within normal limits    EKG EKG  Interpretation  Date/Time:  Tuesday September 06 2020 22:19:19 EDT Ventricular Rate:  93 PR Interval:    QRS Duration: 82 QT Interval:  350 QTC Calculation: 436 R Axis:   50 Text Interpretation: Sinus rhythm Borderline T wave abnormalities Baseline wander in lead(s) V5 No significant change since last tracing Confirmed by Gareth Morgan [phone]) on 09/06/2020 10:23:58 PM   Radiology CT Head Wo Contrast  Result Date: 09/06/2020 CLINICAL DATA:  Mechanical fall, nasal bridge abrasion EXAM: CT HEAD WITHOUT CONTRAST CT MAXILLOFACIAL WITHOUT CONTRAST CT CERVICAL SPINE WITHOUT CONTRAST TECHNIQUE: Multidetector CT imaging of the head, cervical spine, and maxillofacial structures were performed using the standard protocol without intravenous contrast. Multiplanar CT image reconstructions of the cervical spine and maxillofacial structures were also generated. COMPARISON:  MRI 07/12/2020, CT perfusion with angiography of the head and neck 07/12/2020 FINDINGS: CT HEAD FINDINGS Brain: Expected chronic evolution of a previously identified small left thalamic infarct. No evidence of acute infarction, hemorrhage, hydrocephalus, extra-axial collection, visible mass lesion or mass effect. Basal cisterns are patent. Midline intracranial structures are unremarkable. Stable benign dural calcifications. Vascular: Few calcifications in the carotid siphons. No unexpected calcification or acute vascular abnormality. Skull: No large scalp hematoma or significant swelling. No calvarial fracture. Other: None CT MAXILLOFACIAL FINDINGS Osseous: Minimally displaced fractures of the bilateral nasal bones bilaterally with overlying soft tissue swelling and laceration. No fracture of the bony orbits. No other mid face fractures are seen. The pterygoid plates are intact. No visible or suspected temporal bone fractures. Temporomandibular joints are normally aligned with minimal arthrosis. The mandible is intact. No acutely fractured or  avulsed teeth. Multitude of chronically absent dentition as well as impacted bilateral third maxillary and right third mandibular molars. Orbits: No retro  septal gas, stranding or hemorrhage. The globes appear normal and symmetric. Symmetric appearance of the extraocular musculature and optic nerve sheath complexes. Normal caliber of the superior ophthalmic veins. Sinuses: Paranasal sinuses and mastoid air cells are predominantly clear. Bilateral concha bullosa slight rightward nasal septal deviation with a small right-sided nasal septal spur. Middle ear cavities are clear. Ossicular chains are normally configured. Soft tissues: Soft tissue thickening and swelling across the nasal bridge with overlying dermal laceration. No soft tissue gas or foreign body. No other significant soft tissue swelling. No radiopaque foreign body or soft tissue gas. CT CERVICAL SPINE FINDINGS Alignment: A cervical stabilization collar is in place at the time of examination. Some mild straightening of the upper cervical lordosis is similar to the comparison CT angiography from August. No evidence of traumatic listhesis. No abnormally widened, perched or jumped facets. Normal alignment of the craniocervical and atlantoaxial articulations accounting for some mild rightward cranial rotation. Skull base and vertebrae: No acute skull base fracture. No vertebral body fracture or height loss. Normal bone mineralization. No worrisome osseous lesions. Mild atlantodental arthrosis. Some early spondylitic changes are present as well, further detailed below. Soft tissues and spinal canal: No pre or paravertebral fluid or swelling. No visible canal hematoma. Disc levels: Mild multilevel intervertebral disc height loss with some early spondylitic endplate changes with mild uncinate spurring and facet arthropathy. Some early disc desiccation noted C5-6, C6-7 as well. No significant central canal or foraminal stenosis identified within the imaged levels  of the spine. Upper chest: No acute abnormality in the upper chest or imaged lung apices. Calcifications seen in the proximal subclavian arteries as well as throughout the cervical carotids predominantly at the bifurcations. Other: Thyroid gland is unremarkable. IMPRESSION: 1. No acute intracranial abnormality. 2. Expected evolution of a previously identified remote left thalamic infarct. 3. Minimally displaced fractures of the bilateral nasal bones with overlying soft tissue swelling and laceration. 4. No other acute facial bone fracture. 5. No acute fracture or traumatic listhesis of the cervical spine. 6. Some early spondylitic changes without significant central canal or foraminal stenosis. 7. Multitude of chronically absent dentition as well as impacted bilateral third maxillary and right third mandibular molars. 8. Cervical and intracranial atherosclerosis. Electronically Signed   By: Lovena Le M.D.   On: 09/06/2020 23:54   CT Cervical Spine Wo Contrast  Result Date: 09/06/2020 CLINICAL DATA:  Mechanical fall, nasal bridge abrasion EXAM: CT HEAD WITHOUT CONTRAST CT MAXILLOFACIAL WITHOUT CONTRAST CT CERVICAL SPINE WITHOUT CONTRAST TECHNIQUE: Multidetector CT imaging of the head, cervical spine, and maxillofacial structures were performed using the standard protocol without intravenous contrast. Multiplanar CT image reconstructions of the cervical spine and maxillofacial structures were also generated. COMPARISON:  MRI 07/12/2020, CT perfusion with angiography of the head and neck 07/12/2020 FINDINGS: CT HEAD FINDINGS Brain: Expected chronic evolution of a previously identified small left thalamic infarct. No evidence of acute infarction, hemorrhage, hydrocephalus, extra-axial collection, visible mass lesion or mass effect. Basal cisterns are patent. Midline intracranial structures are unremarkable. Stable benign dural calcifications. Vascular: Few calcifications in the carotid siphons. No unexpected  calcification or acute vascular abnormality. Skull: No large scalp hematoma or significant swelling. No calvarial fracture. Other: None CT MAXILLOFACIAL FINDINGS Osseous: Minimally displaced fractures of the bilateral nasal bones bilaterally with overlying soft tissue swelling and laceration. No fracture of the bony orbits. No other mid face fractures are seen. The pterygoid plates are intact. No visible or suspected temporal bone fractures. Temporomandibular joints are normally aligned  with minimal arthrosis. The mandible is intact. No acutely fractured or avulsed teeth. Multitude of chronically absent dentition as well as impacted bilateral third maxillary and right third mandibular molars. Orbits: No retro septal gas, stranding or hemorrhage. The globes appear normal and symmetric. Symmetric appearance of the extraocular musculature and optic nerve sheath complexes. Normal caliber of the superior ophthalmic veins. Sinuses: Paranasal sinuses and mastoid air cells are predominantly clear. Bilateral concha bullosa slight rightward nasal septal deviation with a small right-sided nasal septal spur. Middle ear cavities are clear. Ossicular chains are normally configured. Soft tissues: Soft tissue thickening and swelling across the nasal bridge with overlying dermal laceration. No soft tissue gas or foreign body. No other significant soft tissue swelling. No radiopaque foreign body or soft tissue gas. CT CERVICAL SPINE FINDINGS Alignment: A cervical stabilization collar is in place at the time of examination. Some mild straightening of the upper cervical lordosis is similar to the comparison CT angiography from August. No evidence of traumatic listhesis. No abnormally widened, perched or jumped facets. Normal alignment of the craniocervical and atlantoaxial articulations accounting for some mild rightward cranial rotation. Skull base and vertebrae: No acute skull base fracture. No vertebral body fracture or height loss.  Normal bone mineralization. No worrisome osseous lesions. Mild atlantodental arthrosis. Some early spondylitic changes are present as well, further detailed below. Soft tissues and spinal canal: No pre or paravertebral fluid or swelling. No visible canal hematoma. Disc levels: Mild multilevel intervertebral disc height loss with some early spondylitic endplate changes with mild uncinate spurring and facet arthropathy. Some early disc desiccation noted C5-6, C6-7 as well. No significant central canal or foraminal stenosis identified within the imaged levels of the spine. Upper chest: No acute abnormality in the upper chest or imaged lung apices. Calcifications seen in the proximal subclavian arteries as well as throughout the cervical carotids predominantly at the bifurcations. Other: Thyroid gland is unremarkable. IMPRESSION: 1. No acute intracranial abnormality. 2. Expected evolution of a previously identified remote left thalamic infarct. 3. Minimally displaced fractures of the bilateral nasal bones with overlying soft tissue swelling and laceration. 4. No other acute facial bone fracture. 5. No acute fracture or traumatic listhesis of the cervical spine. 6. Some early spondylitic changes without significant central canal or foraminal stenosis. 7. Multitude of chronically absent dentition as well as impacted bilateral third maxillary and right third mandibular molars. 8. Cervical and intracranial atherosclerosis. Electronically Signed   By: Lovena Le M.D.   On: 09/06/2020 23:54   MR BRAIN WO CONTRAST  Result Date: 09/07/2020 CLINICAL DATA:  Fall EXAM: MRI HEAD WITHOUT CONTRAST TECHNIQUE: Multiplanar, multiecho pulse sequences of the brain and surrounding structures were obtained without intravenous contrast. COMPARISON:  Brain MRI 07/12/2020 FINDINGS: Brain: No acute infarct, acute hemorrhage or extra-axial collection. Focus of chronic microhemorrhage in the right frontal white matter. Normal volume of CSF  spaces. No chronic microhemorrhage. Normal midline structures. Vascular: Normal flow voids. Skull and upper cervical spine: Normal marrow signal. Sinuses/Orbits: Negative. Other: None. IMPRESSION: 1. No acute intracranial abnormality. 2. Focus of chronic microhemorrhage in the right frontal white matter, nonspecific. Electronically Signed   By: Ulyses Jarred M.D.   On: 09/07/2020 01:42   CT Maxillofacial Wo Contrast  Result Date: 09/06/2020 CLINICAL DATA:  Mechanical fall, nasal bridge abrasion EXAM: CT HEAD WITHOUT CONTRAST CT MAXILLOFACIAL WITHOUT CONTRAST CT CERVICAL SPINE WITHOUT CONTRAST TECHNIQUE: Multidetector CT imaging of the head, cervical spine, and maxillofacial structures were performed using the standard protocol without  intravenous contrast. Multiplanar CT image reconstructions of the cervical spine and maxillofacial structures were also generated. COMPARISON:  MRI 07/12/2020, CT perfusion with angiography of the head and neck 07/12/2020 FINDINGS: CT HEAD FINDINGS Brain: Expected chronic evolution of a previously identified small left thalamic infarct. No evidence of acute infarction, hemorrhage, hydrocephalus, extra-axial collection, visible mass lesion or mass effect. Basal cisterns are patent. Midline intracranial structures are unremarkable. Stable benign dural calcifications. Vascular: Few calcifications in the carotid siphons. No unexpected calcification or acute vascular abnormality. Skull: No large scalp hematoma or significant swelling. No calvarial fracture. Other: None CT MAXILLOFACIAL FINDINGS Osseous: Minimally displaced fractures of the bilateral nasal bones bilaterally with overlying soft tissue swelling and laceration. No fracture of the bony orbits. No other mid face fractures are seen. The pterygoid plates are intact. No visible or suspected temporal bone fractures. Temporomandibular joints are normally aligned with minimal arthrosis. The mandible is intact. No acutely fractured  or avulsed teeth. Multitude of chronically absent dentition as well as impacted bilateral third maxillary and right third mandibular molars. Orbits: No retro septal gas, stranding or hemorrhage. The globes appear normal and symmetric. Symmetric appearance of the extraocular musculature and optic nerve sheath complexes. Normal caliber of the superior ophthalmic veins. Sinuses: Paranasal sinuses and mastoid air cells are predominantly clear. Bilateral concha bullosa slight rightward nasal septal deviation with a small right-sided nasal septal spur. Middle ear cavities are clear. Ossicular chains are normally configured. Soft tissues: Soft tissue thickening and swelling across the nasal bridge with overlying dermal laceration. No soft tissue gas or foreign body. No other significant soft tissue swelling. No radiopaque foreign body or soft tissue gas. CT CERVICAL SPINE FINDINGS Alignment: A cervical stabilization collar is in place at the time of examination. Some mild straightening of the upper cervical lordosis is similar to the comparison CT angiography from August. No evidence of traumatic listhesis. No abnormally widened, perched or jumped facets. Normal alignment of the craniocervical and atlantoaxial articulations accounting for some mild rightward cranial rotation. Skull base and vertebrae: No acute skull base fracture. No vertebral body fracture or height loss. Normal bone mineralization. No worrisome osseous lesions. Mild atlantodental arthrosis. Some early spondylitic changes are present as well, further detailed below. Soft tissues and spinal canal: No pre or paravertebral fluid or swelling. No visible canal hematoma. Disc levels: Mild multilevel intervertebral disc height loss with some early spondylitic endplate changes with mild uncinate spurring and facet arthropathy. Some early disc desiccation noted C5-6, C6-7 as well. No significant central canal or foraminal stenosis identified within the imaged  levels of the spine. Upper chest: No acute abnormality in the upper chest or imaged lung apices. Calcifications seen in the proximal subclavian arteries as well as throughout the cervical carotids predominantly at the bifurcations. Other: Thyroid gland is unremarkable. IMPRESSION: 1. No acute intracranial abnormality. 2. Expected evolution of a previously identified remote left thalamic infarct. 3. Minimally displaced fractures of the bilateral nasal bones with overlying soft tissue swelling and laceration. 4. No other acute facial bone fracture. 5. No acute fracture or traumatic listhesis of the cervical spine. 6. Some early spondylitic changes without significant central canal or foraminal stenosis. 7. Multitude of chronically absent dentition as well as impacted bilateral third maxillary and right third mandibular molars. 8. Cervical and intracranial atherosclerosis. Electronically Signed   By: Lovena Le M.D.   On: 09/06/2020 23:54    Procedures Procedures (including critical care time)  Medications Ordered in ED Medications - No data to  display  ED Course  I have reviewed the triage vital signs and the nursing notes.  Pertinent labs & imaging results that were available during my care of the patient were reviewed by me and considered in my medical decision making (see chart for details).    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  Neurologic symptoms she has today are consistent with the symptoms she had with her recent stroke, and given history of some waxing and waning persistent numbness and tingling, discussed with neurology and agree that she is not a TPA or intervention candidate.  CT head, cervical  spine and face show nasal bone fractures without signs of other acute abnormalities.  Noted to have some tremor on the right with exam, but does not appear to have rhythmic shaking, and does not clearly represent a focal or partial seizure.  Discussed with neurology and patient about possibility of starting Keppra, but patient prefers to wait for an evaluation with her outpatient neurologist.  Will obtain MRI to evaluate for signs of new stroke in setting of new worsening symptoms.  If MRI does not show signs of acute stroke, feel her symptoms are likely secondary to recrudescence of prior stroke in the setting of trauma, pain and hypertension.  Given number for ear nose and throat, recommend neurology follow-up.  Care signed out to Dr. Nicholes Stairs with MRI pending.  Final Clinical Impression(s) / ED Diagnoses Final diagnoses:  Fall, initial encounter  Right sided numbness  Closed fracture of nasal bone, initial encounter  History of ischemic vertebrobasilar artery thalamic stroke    Rx / DC Orders ED Discharge Orders    None       Gareth Morgan, MD 09/07/20 1123

## 2020-09-07 ENCOUNTER — Emergency Department (HOSPITAL_COMMUNITY): Payer: Medicare PPO

## 2020-09-07 LAB — COMPREHENSIVE METABOLIC PANEL
ALT: 32 U/L (ref 0–44)
AST: 59 U/L — ABNORMAL HIGH (ref 15–41)
Albumin: 4.1 g/dL (ref 3.5–5.0)
Alkaline Phosphatase: 71 U/L (ref 38–126)
Anion gap: 11 (ref 5–15)
BUN: 17 mg/dL (ref 6–20)
CO2: 22 mmol/L (ref 22–32)
Calcium: 9.3 mg/dL (ref 8.9–10.3)
Chloride: 105 mmol/L (ref 98–111)
Creatinine, Ser: 0.83 mg/dL (ref 0.44–1.00)
GFR, Estimated: >60 mL/min (ref >60)
Glucose, Bld: 103 mg/dL — ABNORMAL HIGH (ref 70–99)
Potassium: 5.8 mmol/L — ABNORMAL HIGH (ref 3.5–5.1)
Sodium: 138 mmol/L (ref 135–145)
Total Bilirubin: 0.9 mg/dL (ref 0.3–1.2)
Total Protein: 7.4 g/dL (ref 6.5–8.1)

## 2020-09-07 IMAGING — MR MR HEAD W/O CM
12 of 13 series · 44 of 48 positions shown · non-contrast
Comparison: Brain MRI [DATE]

CLINICAL DATA: Fall

EXAM:
MRI HEAD WITHOUT CONTRAST
TECHNIQUE: Multiplanar, multiecho pulse sequences of the brain and surrounding
structures were obtained without intravenous contrast.

[Series 5: DWI · axial · 3.0mm · 0.88mm/px · z∈[-104,+48]mm · 8 of 104 slices shown (1 of 4)]
[im 1/104]
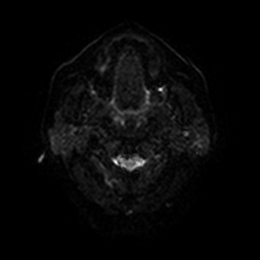
[im 15/104]
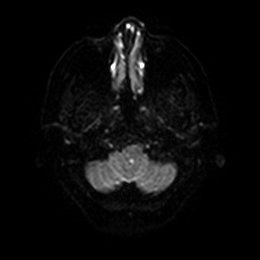
[im 30/104]
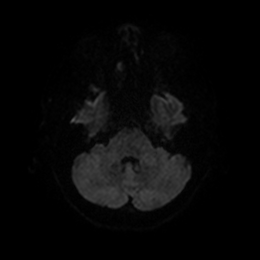
[im 45/104]
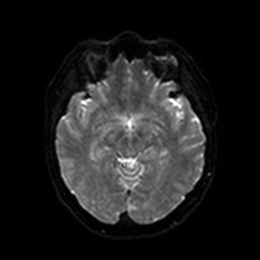
[im 59/104]
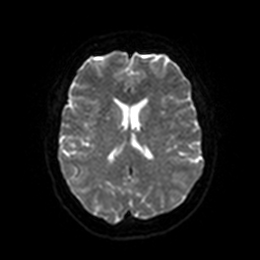
[im 74/104]
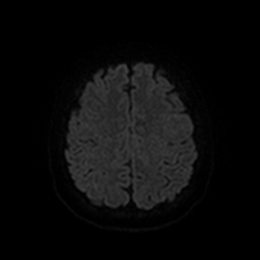
[im 89/104]
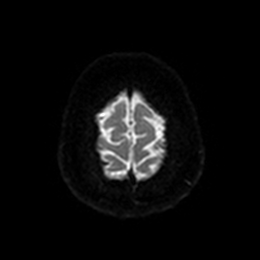
[im 104/104]
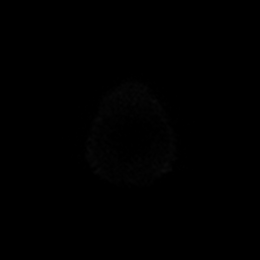

[Series 6: DWI · axial · 3.0mm · 0.88mm/px · z∈[-104,+48]mm · 4 of 52 slices shown (2 of 4)]
[im 1/52]
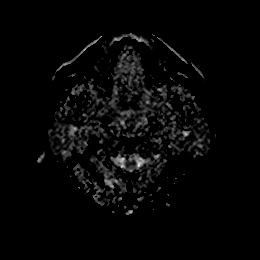
[im 18/52]
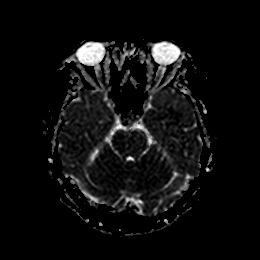
[im 35/52]
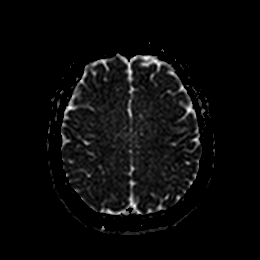
[im 52/52]
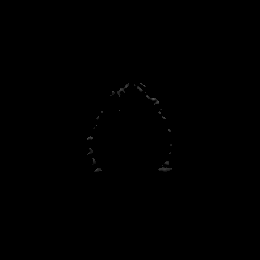

[Series 7: DWI · coronal · 4.0mm · 0.88mm/px · 6 of 72 slices shown (3 of 4)]
[im 1/72]
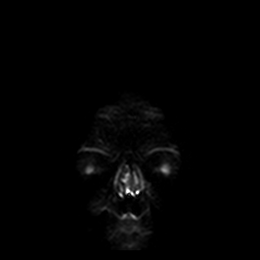
[im 15/72]
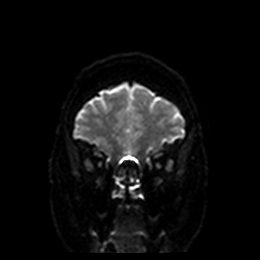
[im 29/72]
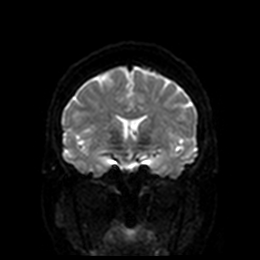
[im 43/72]
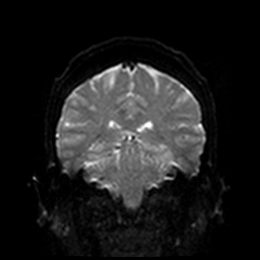
[im 57/72]
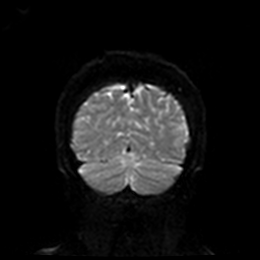
[im 72/72]
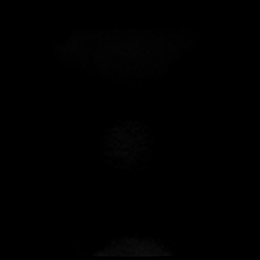

[Series 8: DWI · coronal · 4.0mm · 0.88mm/px · 3 of 36 slices shown (4 of 4)]
[im 1/36]
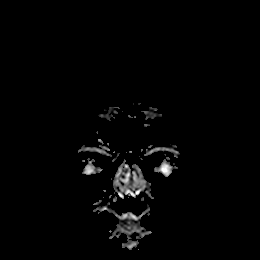
[im 18/36]
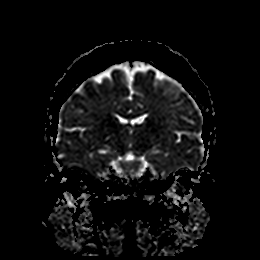
[im 36/36]
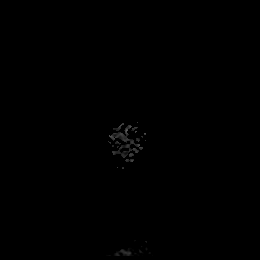

[Series 9: T1 · sagittal · 5.0mm · 0.75mm/px · 2 of 25 slices shown]
[im 1/25]
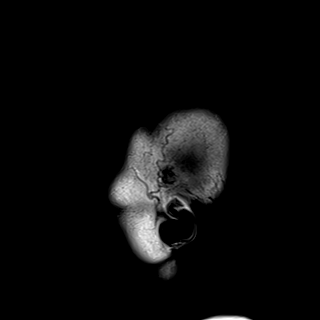
[im 25/25]
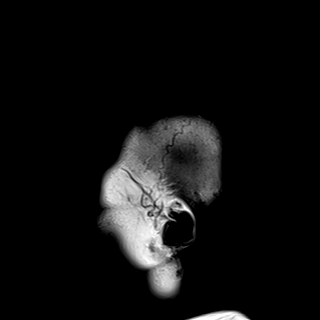

[Series 10: T2 · axial · 5.0mm · 0.72mm/px · z∈[-98,+45]mm · 2 of 25 slices shown (1 of 2)]
[im 1/25]
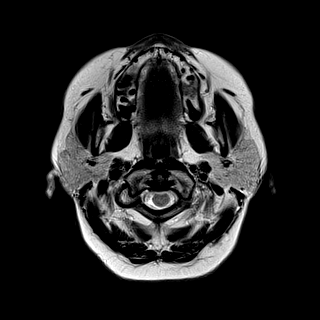
[im 25/25]
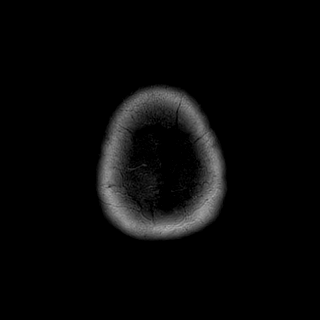

[Series 11: FLAIR · axial · 5.0mm · 0.45mm/px · z∈[-99,+44]mm · 2 of 25 slices shown]
[im 1/25]
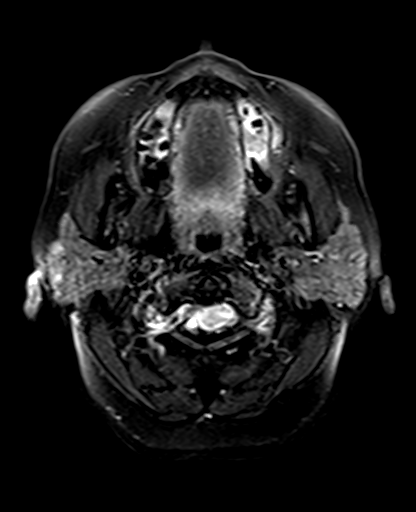
[im 25/25]
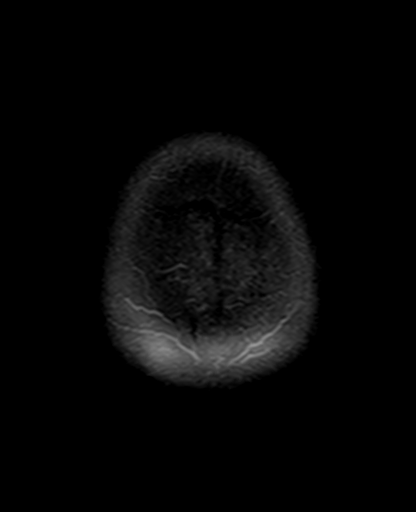

[Series 12: mag_images · axial · 3.0mm · 0.90mm/px · z∈[-101,+51]mm · 4 of 52 slices shown]
[im 1/52]
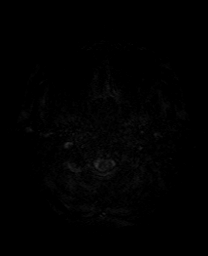
[im 18/52]
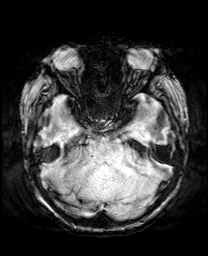
[im 35/52]
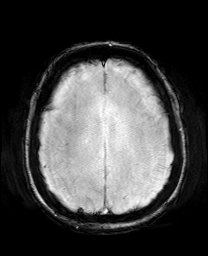
[im 52/52]
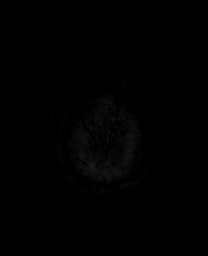

[Series 13: pha_images · axial · 3.0mm · 0.90mm/px · z∈[-98,+51]mm · 4 of 51 slices shown]
[im 1/51]
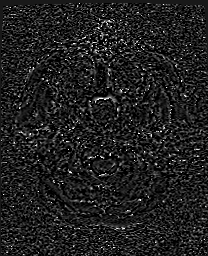
[im 17/51]
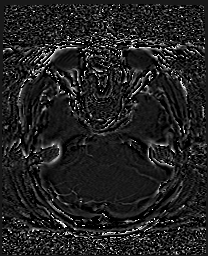
[im 34/51]
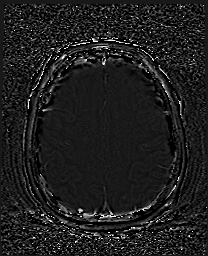
[im 51/51]
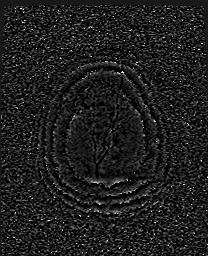

[Series 14: swi_images · axial · 3.0mm · 0.90mm/px · z∈[-101,+51]mm · 4 of 52 slices shown]
[im 1/52]
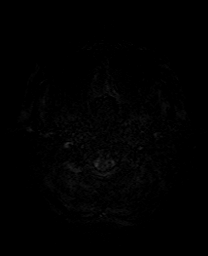
[im 18/52]
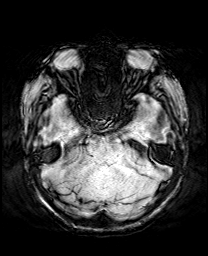
[im 35/52]
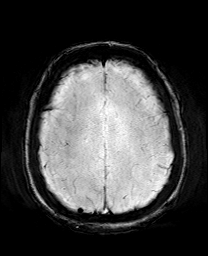
[im 52/52]
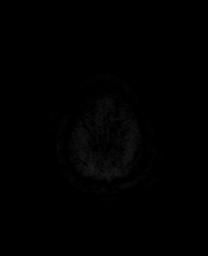

[Series 15: mip_images(sw) · axial · 24.0mm · 0.90mm/px · z∈[-90,+41]mm · 3 of 45 slices shown]
[im 1/45]
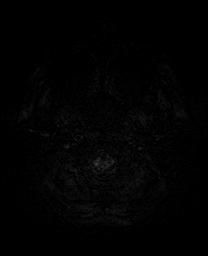
[im 23/45]
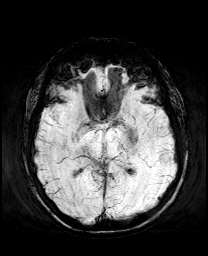
[im 45/45]
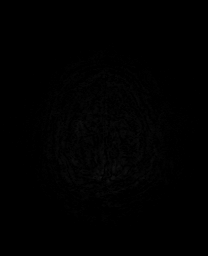

[Series 17: T2 · coronal · 5.0mm · 0.34mm/px · 2 of 29 slices shown (2 of 2)]
[im 1/29]
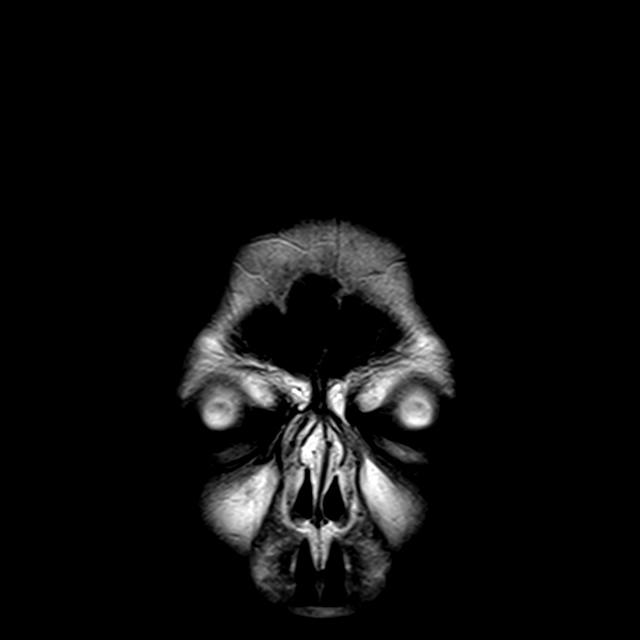
[im 29/29]
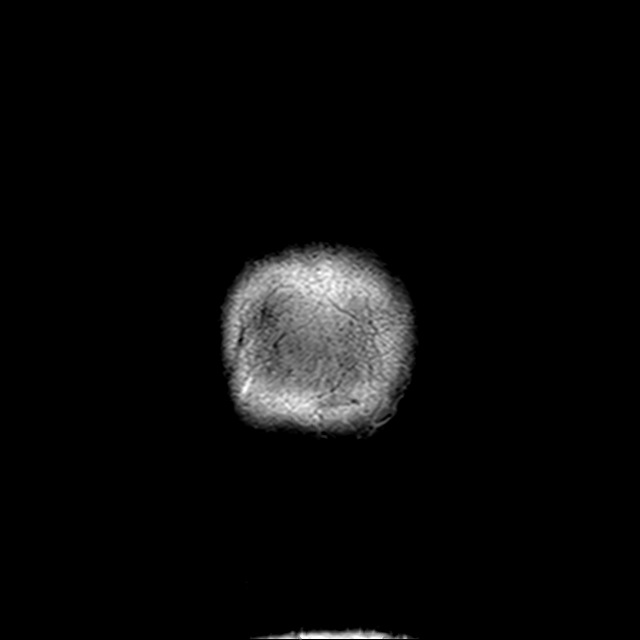

[44 of 48 positions shown; findings below may reference images not displayed]

FINDINGS: Brain: No acute infarct, acute hemorrhage or extra-axial collection.
Focus of chronic microhemorrhage in the right frontal white matter.
Normal volume of CSF spaces. No chronic microhemorrhage. Normal
midline structures.

Vascular: Normal flow voids.

Skull and upper cervical spine: Normal marrow signal.

Sinuses/Orbits: Negative.

Other: None.
IMPRESSION: 1. No acute intracranial abnormality.
2. Focus of chronic microhemorrhage in the right frontal white
matter, nonspecific.

## 2020-09-07 MED ORDER — SODIUM ZIRCONIUM CYCLOSILICATE 5 G PO PACK
5.0000 g | PACK | Freq: Once | ORAL | Status: DC
  Filled 2020-09-07: qty 1

## 2020-09-07 NOTE — Progress Notes (Signed)
I agree with the above plan 

## 2020-09-07 NOTE — ED Notes (Signed)
Patient transported to MRI 

## 2020-09-14 ENCOUNTER — Ambulatory Visit: Payer: Medicare PPO | Admitting: Nutrition

## 2020-09-21 ENCOUNTER — Encounter: Payer: Medicare PPO | Admitting: Physical Medicine & Rehabilitation

## 2020-09-27 ENCOUNTER — Encounter: Payer: Medicare PPO | Attending: Family Medicine | Admitting: Nutrition

## 2020-09-27 ENCOUNTER — Other Ambulatory Visit: Payer: Self-pay

## 2020-09-27 DIAGNOSIS — E119 Type 2 diabetes mellitus without complications: Secondary | ICD-10-CM | POA: Diagnosis present

## 2020-09-27 NOTE — Progress Notes (Signed)
Patient was identified by name and dob.  She is here today to learn about "how foods affect my blood sugars and how this insulin works".   SBGM:  FBSs: 100-120, acL: 120s, acS: 100-120. We discussed type II diabetes and it's progression, and the need for basal insulin.  Discussed also the fact that we are relying on her own body's insulin to capture the rise in blood sugar for the meals she is eating.  Discussed how each food group affects blood sugar and the need to limit sizes of those food groups to prevent blood sugar rises at respective times after the meal.  She reported good understanding of this. Typical day: 6AM: 2 cups of coffee with small amount of cream.   8AM: Bfast:  Veg.omelet-no cheese, sausage or [place] bacon.  Small amount of spay oil.  No toast.  Apple with peanut butter.   1-2PM: 8 ounces cooked hamburger on salad with no dressing.  No crackers or carbs.  Water to drink.  6-7PM: 8-10 ounces salmon with 3-4 green veg., like broccoli, or cabbage, or brussel sprouts, or greens.  Rare sweet potato (once a week).  No bread.  Water to dink 10PM: 1 graham cracker with peanut butter or apple with peanut butter.  Says does not want a "weight loss diet"t,  She says she is loosing weight, on her own, but very slowly.  Has lost 55 pounds in the last 18 months.  SheJust does not want to regain the weight.  Reports having been to see several dietitians, and does not want a calorie reduction diet.  Discussed how she is eating more protein than is healthy, but that she should test her blood sugar 2-3 hours after lunch or supper one day each week, and if the reading is less than 160, than her pancrease is able to handle the protein she is eating.  She was happy with this answer, and said she will consider limiting the protein to no more than 6-8 ounces.  Stressed the need for exercise and she was shown how exercise can help to make the insulin more sensitive, requiring less insulin to cover meals,  and hopfully prevent need for meal time insulin Patient was extremely grateful for the information on diabetes, and agreed to return in 1 month.  She had no final questions.

## 2020-10-05 ENCOUNTER — Encounter: Payer: Self-pay | Admitting: Physical Medicine & Rehabilitation

## 2020-10-05 ENCOUNTER — Other Ambulatory Visit: Payer: Self-pay

## 2020-10-05 ENCOUNTER — Encounter: Payer: Medicare PPO | Attending: Registered Nurse | Admitting: Physical Medicine & Rehabilitation

## 2020-10-05 VITALS — BP 141/81 | HR 68 | Temp 98.9°F | Ht 63.0 in | Wt 214.0 lb

## 2020-10-05 DIAGNOSIS — M1611 Unilateral primary osteoarthritis, right hip: Secondary | ICD-10-CM | POA: Diagnosis present

## 2020-10-05 DIAGNOSIS — M25562 Pain in left knee: Secondary | ICD-10-CM

## 2020-10-05 NOTE — Progress Notes (Signed)
Subjective:    Patient ID: [name] [name], female    DOB: [birth year], 59 y.o.   MRN: [id number]  HPI   Mrs Culley is here in follow up of her left thalamic infarct. She just was blessed with a grandchild on 09/07/20!Marland Kitchen   She is walking without a device now although her right hip is still giving her a lot of problems. She uses CBD oil which seems to be helping her quite a bit.   She graduated quickly from therapy. She is on her HEP which has included VR activities! She uses a stationary and electric bike.   She fell d/t a freak occurence in October and broke her nose. She was seen in the ED only. She also landed on her left knee which continues to bother quite a bit.   Sugars are tightly controlled and she is typically under 130. Her hgb A1C was 7+ recently.    Pain Inventory Average Pain 0 Pain Right Now 0 My pain is na  LOCATION OF PAIN  na  BOWEL Number of stools per week: na Oral laxative use na Type of laxative na Enema or suppository use na History of colostomy na Incontinent na  BLADDER Normal In and out cath, frequency na Able to self cath na Bladder incontinence na Frequent urination na Leakage with coughing na Difficulty starting stream na Incomplete bladder emptying na   Mobility walk without assistance how many minutes can you walk? 30 ability to climb steps?  yes do you drive?  yes  Function retired  Neuro/Psych numbness tingling depression anxiety  Prior Studies Any changes since last visit?  no  Physicians involved in your care Any changes since last visit?  no   Family History  Problem Relation Age of Onset  . Alcohol abuse Mother   . Arthritis Mother   . Diabetes Mother   . Hypertension Mother   . Hyperlipidemia Mother   . Alcohol abuse Father   . Arthritis Father   . Asthma Father   . Heart disease Father   . Hypertension Father   . Hyperlipidemia Father   . Alcohol abuse Brother   . Arthritis Brother   . Drug  abuse Brother    Social History   Socioeconomic History  . Marital status: Married    Spouse name: Not on file  . Number of children: Not on file  . Years of education: Not on file  . Highest education level: Not on file  Occupational History  . Not on file  Tobacco Use  . Smoking status: Former Smoker    Quit date: 2004    Years since quitting: 17.9  . Smokeless tobacco: Never Used  Vaping Use  . Vaping Use: Never used  Substance and Sexual Activity  . Alcohol use: Yes    Alcohol/week: 3.0 standard drinks    Types: 1 Glasses of wine, 1 Cans of beer, 1 Shots of liquor per week    Comment: social once month  . Drug use: No    Comment: THC edible 10mg   . Sexual activity: Yes  Other Topics Concern  . Not on file  Social History Narrative  . Not on file   Social Determinants of Health   Financial Resource Strain:   . Difficulty of Paying Living Expenses: Not on file  Food Insecurity:   . Worried About Programme researcher, broadcasting/film/video in the Last Year: Not on file  . Ran Out of Food in the Last Year: Not  on file  Transportation Needs:   . Lack of Transportation (Medical): Not on file  . Lack of Transportation (Non-Medical): Not on file  Physical Activity:   . Days of Exercise per Week: Not on file  . Minutes of Exercise per Session: Not on file  Stress:   . Feeling of Stress : Not on file  Social Connections:   . Frequency of Communication with Friends and Family: Not on file  . Frequency of Social Gatherings with Friends and Family: Not on file  . Attends Religious Services: Not on file  . Active Member of Clubs or Organizations: Not on file  . Attends Banker Meetings: Not on file  . Marital Status: Not on file   Past Surgical History:  Procedure Laterality Date  . BREAST BIOPSY     needle bx more than 15 yrs benign, no scar visible   . LACERATION REPAIR N/A 12/13/2017   Procedure: Closure of nasal lacerations with one rotator graft and one advanced flap with  skin graft, closure of left nasal laceration, closure of left ear.;  Surgeon: Suzanna Obey, MD;  Location: [facility];  Service: ENT;  Laterality: N/A;   Past Medical History:  Diagnosis Date  . Chickenpox   . Complication of anesthesia    anxiety and panic  . Diabetes mellitus without complication (HCC)    diet controlled, no longer needs meds  . Elevated liver enzymes    ABD US done 08/2016: fatty liver  . Fatty liver    ABD US done 08/2016: fatty liver  . Hyperlipidemia    no longer needs meds  . Hypertension    pt states no longer needs meds  . Hypothyroidism   . OA (osteoarthritis) of hip    right  . Schizophrenia (HCC)   . Thyroid disease    no meds   BP (!) 141/81   Pulse 68   Temp 98.9 F (37.2 C)   Ht 5\' 3"  (1.6 m)   Wt 214 lb (97.1 kg)   SpO2 94%   BMI 37.91 kg/m   Opioid Risk Score:   Fall Risk Score:  `1  Depression screen PHQ 2/9  Depression screen [facility] 2/9 08/08/2020 11/08/2017  Decreased Interest 0 2  Down, Depressed, Hopeless 0 2  PHQ - 2 Score 0 4  Altered sleeping 2 3  Tired, decreased energy 1 3  Change in appetite 0 2  Feeling bad or failure about yourself  0 0  Trouble concentrating 1 3  Moving slowly or fidgety/restless 0 3  Suicidal thoughts 0 1  PHQ-9 Score 4 19  Difficult doing work/chores Not difficult at all -    Review of Systems     Objective:   Physical Exam  Gen: no distress, normal appearing HEENT: oral mucosa pink and moist, NCAT Cardio: Reg rate Chest: normal effort, normal rate of breathing Abd: soft, non-distended Ext: no edema Skin: intact Neuro: Alert and oriented x 3. Normal insight and awareness. Intact Memory. Normal language and speech. Cranial nerve exam unremarkable. Motor exam nearly normal. No gross sensory abnl. No PD, intact FTN Musculoskeletal: right hip tender with FABER, left knee tender anteriorly, +/- meniscal pain. No effusion. Wide based antalgic gait, right more affected than left.  Psych: pleasant,  normal affect       Assessment & Plan:  1. Left thalamic infarct  -outpt therapies 2. Diabetes II 3. OA of right hip 4. Hx of Schizophrenia/Bipolar/PTSD 5. Left knee pain/contusion, pre-patellar bursitis  Plan: 1. Provided knee strengthening exercise program. Reviewed with patient 2. Xrays left knee ordered 3. Supplements for OA recommended. She can continue CPD oil also. Recommended ice and voltaren gel to left knee as well.  4. Made referral to Dr. Allie Bossier for assessment of her right hip as well as left knee 5. Recommend at least a straight cane for balance and to offload the right leg.  6. Diabetes mgt was discussed  25 minutes of face to face patient care time were spent during this visit. All questions were encouraged and answered.  Follow up with me in 3 mos .

## 2020-10-05 NOTE — Patient Instructions (Signed)
SUPPLEMENTS USEFUL FOR OSTEOARTHRITIS: OMEGA 3 FATTY ACIDS, TURMERIC, GINGER, TART CHERRY EXTRACT, CELERY SEED, GLUCOSAMINE WITH CHONDROITIN    VOLTAREN GEL FOR YOUR LEFT KNEE ALSO, OTC

## 2020-10-31 ENCOUNTER — Ambulatory Visit: Payer: Medicare PPO | Admitting: Nutrition

## 2020-11-10 LAB — COLOGUARD: COLOGUARD: POSITIVE — AB

## 2020-11-15 ENCOUNTER — Ambulatory Visit
Admission: RE | Admit: 2020-11-15 | Discharge: 2020-11-15 | Disposition: A | Discharge status: Home / Self Care | Payer: Medicare PPO | Source: Ambulatory Visit | Attending: Physical Medicine & Rehabilitation | Admitting: Physical Medicine & Rehabilitation

## 2020-11-15 ENCOUNTER — Other Ambulatory Visit: Payer: Self-pay

## 2020-11-15 DIAGNOSIS — M25562 Pain in left knee: Secondary | ICD-10-CM

## 2020-11-15 IMAGING — CR DG KNEE COMPLETE 4+V*L*
5 series · 5 of 5 positions shown · non-contrast
Comparison: None.

CLINICAL DATA: Fall 2 months ago. Persistent left knee pain and
swelling. Initial encounter.

EXAM:
LEFT KNEE - COMPLETE 4+ VIEW

[w knee ap left]
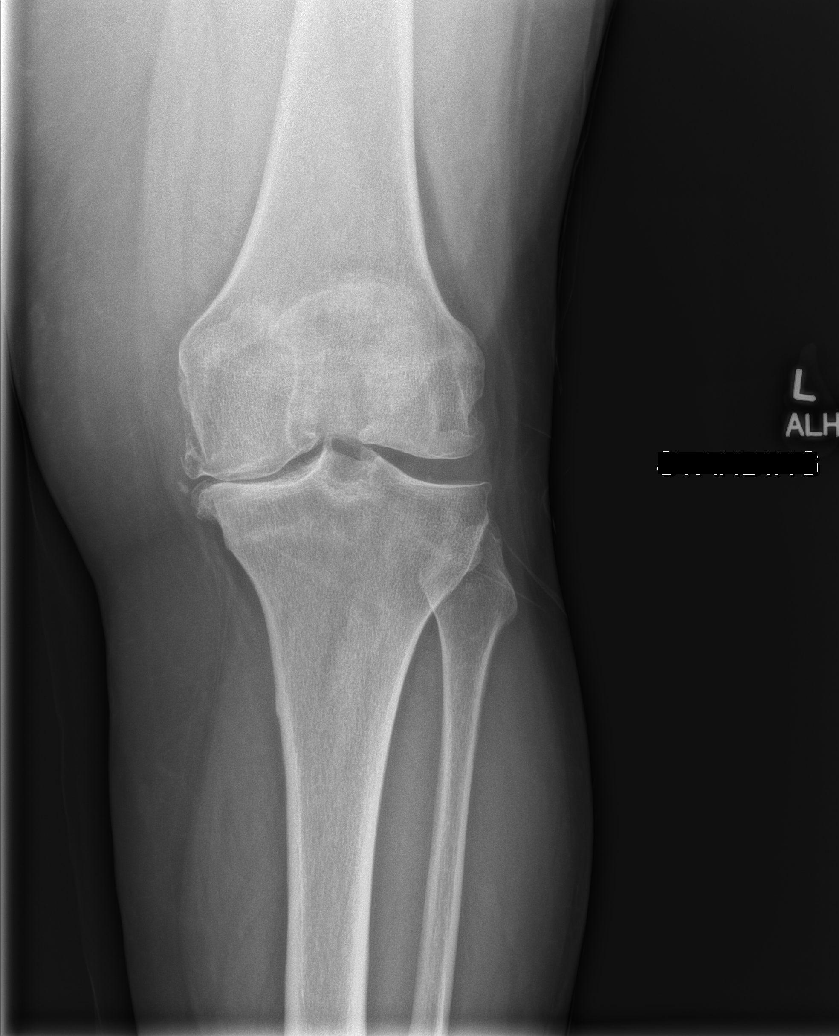

[w knee lat left (1 of 2)]
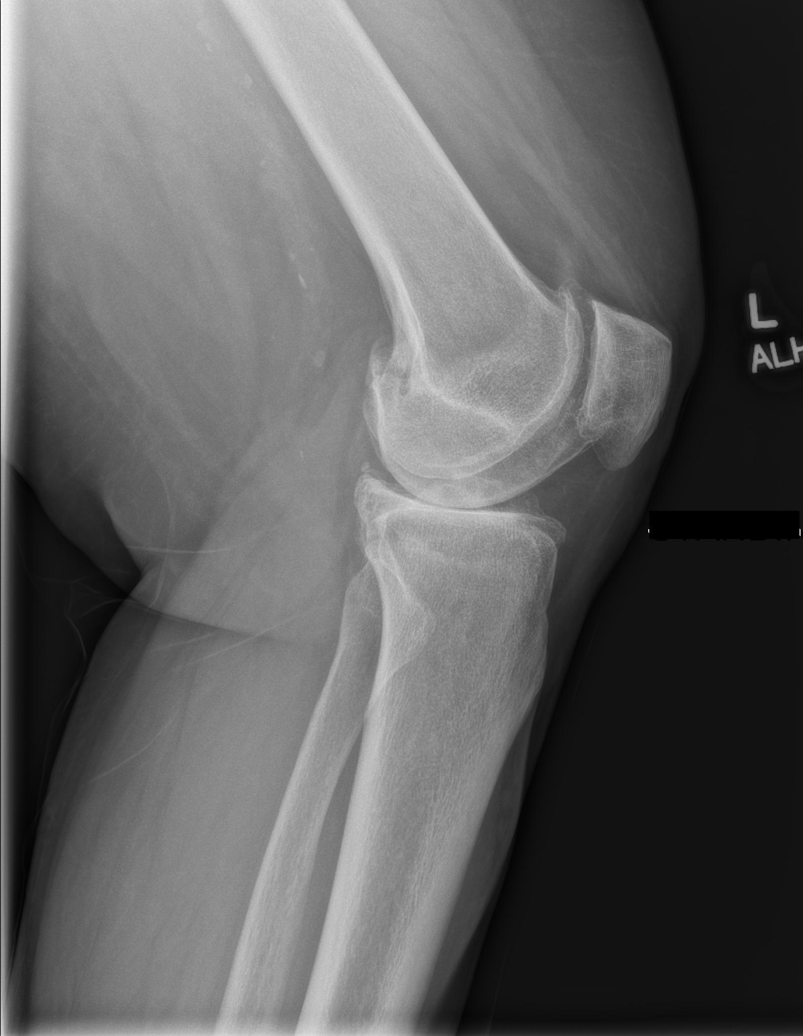

[w knee lat left (2 of 2)]
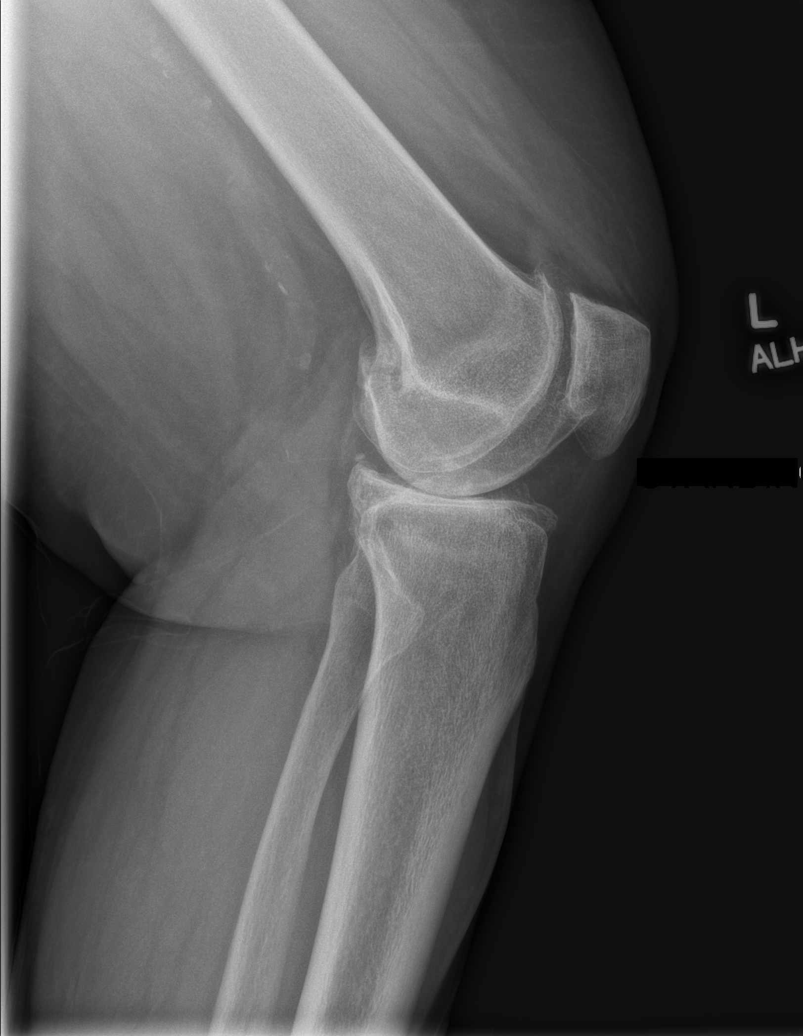

[w knee tunnel pa left]
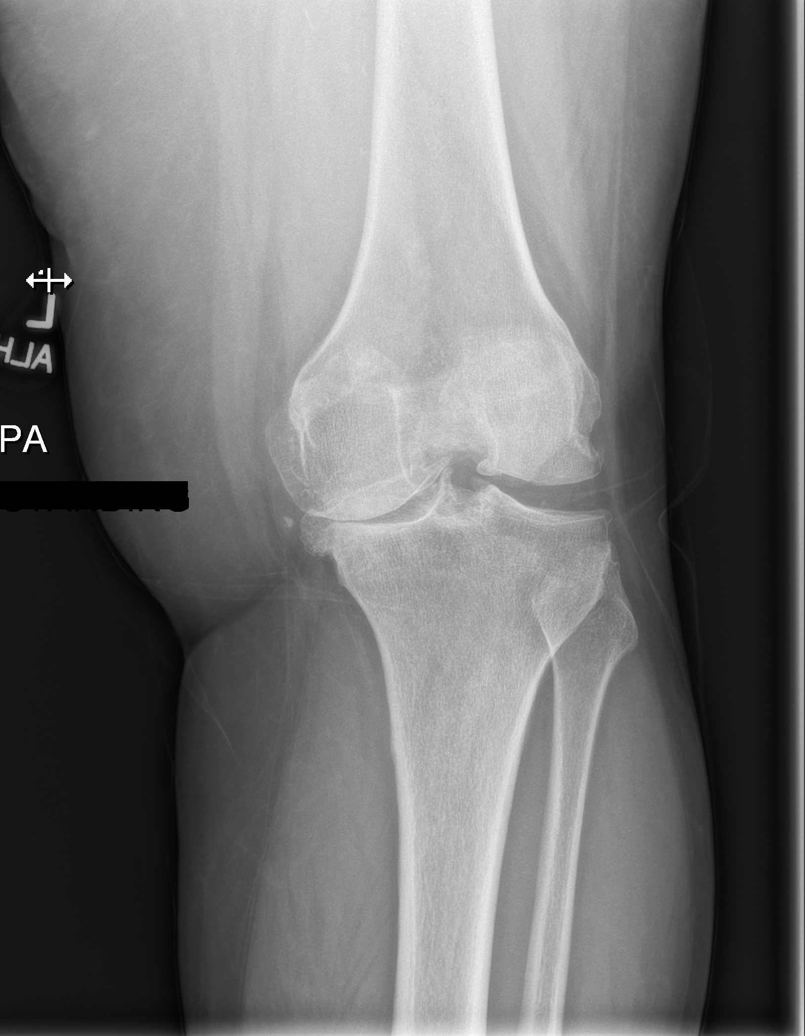

[x knee sunrise left]
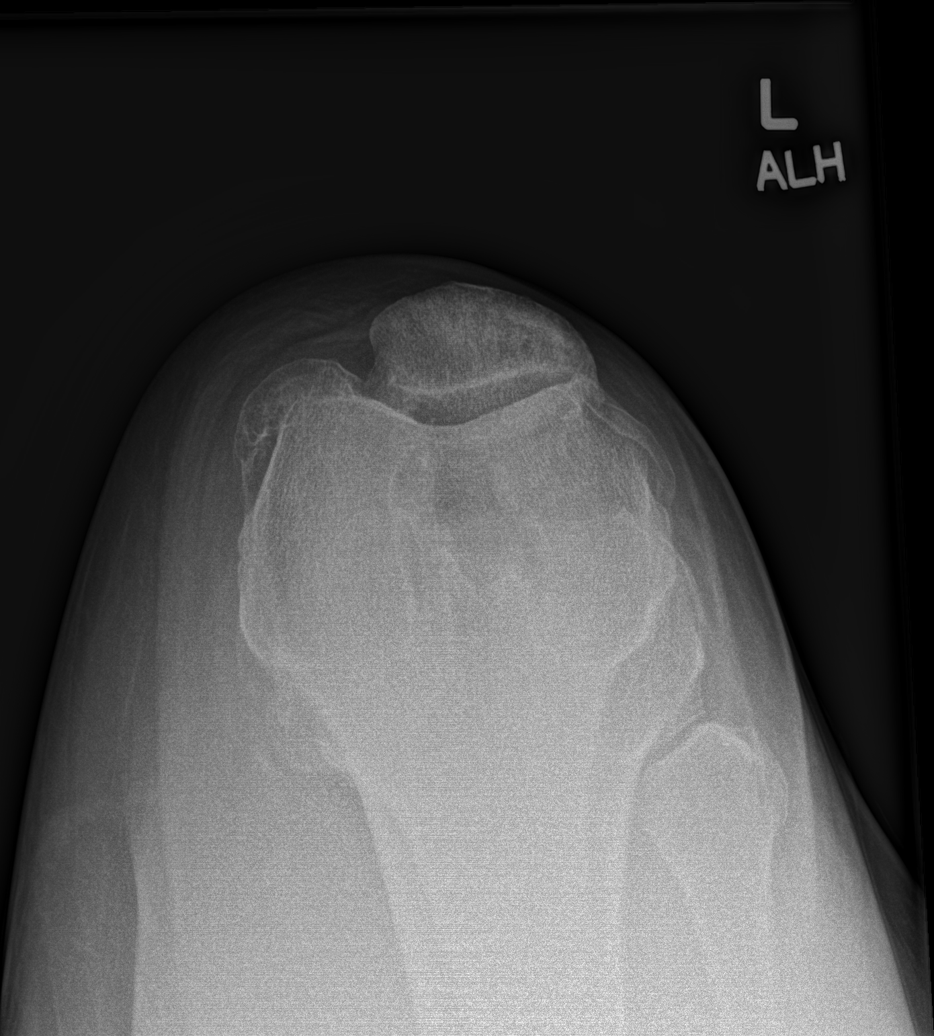

[5 of 5 positions shown; findings below may reference images not displayed]

FINDINGS: No evidence of fracture, dislocation, or joint effusion. Severe
tricompartmental osteoarthritis is seen, with near complete loss of
medial joint space. No other osseous abnormality identified. Mild
peripheral vascular calcification noted.
IMPRESSION: No acute findings.

Severe tricompartmental osteoarthritis.

## 2020-11-16 ENCOUNTER — Ambulatory Visit: Payer: Medicare PPO | Admitting: Orthopaedic Surgery

## 2020-11-16 VITALS — Ht 63.0 in | Wt 214.0 lb

## 2020-11-16 DIAGNOSIS — M1611 Unilateral primary osteoarthritis, right hip: Secondary | ICD-10-CM | POA: Insufficient documentation

## 2020-11-16 DIAGNOSIS — M1712 Unilateral primary osteoarthritis, left knee: Secondary | ICD-10-CM | POA: Diagnosis not present

## 2020-11-16 NOTE — Progress Notes (Signed)
Office Visit Note   Patient: [name] [name]           Date of Birth: Aug 30, 1961           MRN: [id number] Visit Date: 11/16/2020              Requested by: Ranelle Oyster, MD [address] [place],  [place] [address] PCP: Dois Davenport, MD   Assessment & Plan: Visit Diagnoses:  1. Unilateral primary osteoarthritis, right hip   2. Unilateral primary osteoarthritis, left knee     Plan: We talked in detail about hip replacement surgery and knee replacement surgery.  She would like to proceed with the right hip first and I agree with this.  Given her x-ray findings and clinical exam findings I do feel this is warranted at this standpoint given the severity of her arthritis.  We had a long thorough discussion about the risk and benefits of this type of surgery.  We talked about what to expect with an intraoperative and postoperative course.  She does have a hemoglobin A1c being checked at the end of this month.  I am comfortable with filling out a surgery schedule sheet to consider replacing her right hip in the next month or so.  All questions and concerns were answered and addressed.  We will be in touch about scheduling surgery.  She will send [place] her next hemoglobin A1c results as well.  Follow-Up Instructions: Return for 2 weeks post-op.   Orders:  No orders of the defined types were placed in this encounter.  No orders of the defined types were placed in this encounter.     Procedures: No procedures performed   Clinical Data: No additional findings.   Subjective: Chief Complaint  Patient presents with  . Right Hip - Pain  . Left Knee - Pain  The patient is a very pleasant 60 year old female who comes for evaluation treatment of known end-stage arthritis of her right hip and her left knee.  She has not had significant issues with the joints until she had a stroke in August of this past year.  She is now on blood thinning medication.  At one time her  diabetes was under not great control and that may have contributed to her stroke.  She is successfully rehabbed her right side in terms of the weakness that she had is related to the stroke.  She does have daily hip pain on her right hip and her left knee.  X-rays of both show severe end-stage arthritis.  At this point her left knee and her right hip arthritis are detriment affecting her mobility, her quality of life and her activities day living.  Her right hip hurts worse than the left knee and she like to proceed with a right hip replacement before left knee replacement.  Her last hemoglobin A1c was 7.5.  She does have this scheduled to be rechecked at the end of this month.  She reports that she has been under good control.  She does take insulin for diabetes.  Again, she is not on blood thinning medication as it relates to her stroke and just takes a baby aspirin daily.  HPI  Review of Systems She currently denies any headache, chest pain, shortness of breath, fever, chills, nausea, vomiting  Objective: Vital Signs: Ht 5\' 3"  (1.6 m)   Wt 214 lb (97.1 kg)   BMI 37.91 kg/m   Physical Exam She is alert and  orient x3 and in no acute distress Ortho Exam On examination she does mobilize her upper extremities quite well and has good pinch and grip strength with no residual effects from her stroke.  Her right hip has significant limitations with internal and external rotation with severe pain in the groin.  Her left hip moves more normally with no pain at all.  Her left knee does have varus malalignment with significant patellofemoral crepitation and significant medial joint line tenderness with good range of motion. Specialty Comments:  No specialty comments available.  Imaging: No results found. An AP pelvis lateral the right hip that are on the canopy system shows severe end-stage arthritis of the right hip.  The joint space is almost essentially gone and narrowed.  There are peritracheal  osteophytes and sclerotic changes in the femoral head and acetabulum as well as cystic changes.  Her left knee shows end-stage arthritis with varus malalignment and complete loss of medial joint space.  There are large osteophytes in all 3 compartments and significant patellofemoral arthritic changes.  PMFS History: Patient Active Problem List   Diagnosis Date Noted  . Unilateral primary osteoarthritis, left knee 11/16/2020  . Unilateral primary osteoarthritis, right hip 11/16/2020  . Osteoarthritis of hip 07/28/2020  . Transaminitis   . Erythrocytosis   . Uncontrolled type 2 diabetes mellitus with hyperglycemia, with long-term current use of insulin (HCC)   . 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  . Schizoaffective disorder (HCC) 07/14/2020  . Acute ischemic stroke (HCC) 07/13/2020  . Dog bite of face 08/13/2018  . Dog bite of ear 08/13/2018  . Diabetes mellitus without complication (HCC)   . Mood disorder (HCC) 11/11/2017  . Type 2 diabetes mellitus without complication, without long-term current use of insulin (HCC) 11/11/2017  . Class 2 obesity with body mass index (BMI) of 37.0 to 37.9 in adult 11/11/2017  . Mixed hyperlipidemia 11/11/2017  . Tinea corporis 11/08/2017   Past Medical History:  Diagnosis Date  . Chickenpox   . Complication of anesthesia    anxiety and panic  . Diabetes mellitus without complication (HCC)    diet controlled, no longer needs meds  . Elevated liver enzymes    ABD US done 08/2016: fatty liver  . Fatty liver    ABD US done 08/2016: fatty liver  . Hyperlipidemia    no longer needs meds  . Hypertension    pt states no longer needs meds  . Hypothyroidism   . OA (osteoarthritis) of hip    right  . Schizophrenia (HCC)   . Thyroid disease    no meds    Family History  Problem Relation Age of Onset  . Alcohol abuse Mother   . Arthritis Mother   . Diabetes Mother   . Hypertension Mother   . Hyperlipidemia Mother   . Alcohol abuse Father   .  Arthritis Father   . Asthma Father   . Heart disease Father   . Hypertension Father   . Hyperlipidemia Father   . Alcohol abuse Brother   . Arthritis Brother   . Drug abuse Brother     Past Surgical History:  Procedure Laterality Date  . BREAST BIOPSY     needle bx more than 15 yrs benign, no scar visible   . LACERATION REPAIR N/A 12/13/2017   Procedure: Closure of nasal lacerations with one rotator graft and one advanced flap with skin graft, closure of left nasal laceration, closure of left ear.;  Surgeon: Suzanna Obey, MD;  Location: [facility];  Service: ENT;  Laterality: N/A;   Social History   Occupational History  . Not on file  Tobacco Use  . Smoking status: Former Smoker    Quit date: 2004    Years since quitting: 18.0  . Smokeless tobacco: Never Used  Vaping Use  . Vaping Use: Never used  Substance and Sexual Activity  . Alcohol use: Yes    Alcohol/week: 3.0 standard drinks    Types: 1 Glasses of wine, 1 Cans of beer, 1 Shots of liquor per week    Comment: social once month  . Drug use: No    Comment: THC edible 10mg   . Sexual activity: Yes

## 2020-11-17 ENCOUNTER — Telehealth: Payer: Self-pay

## 2020-11-17 NOTE — Telephone Encounter (Signed)
Faxed to provided number  

## 2020-11-17 NOTE — Telephone Encounter (Signed)
Patient called she is requesting notes from yesterdays visit to be sent to her pcp :  Nadyne Coombes MD [facility]  Fax:[phone] Cb:[phone]

## 2020-12-05 ENCOUNTER — Encounter: Payer: Self-pay | Admitting: Physician Assistant

## 2020-12-19 DIAGNOSIS — R195 Other fecal abnormalities: Secondary | ICD-10-CM | POA: Insufficient documentation

## 2020-12-20 ENCOUNTER — Ambulatory Visit (INDEPENDENT_AMBULATORY_CARE_PROVIDER_SITE_OTHER): Payer: Medicare PPO | Admitting: Physician Assistant

## 2020-12-20 ENCOUNTER — Encounter: Payer: Self-pay | Admitting: Physician Assistant

## 2020-12-20 ENCOUNTER — Ambulatory Visit: Payer: Medicare PPO | Admitting: Physician Assistant

## 2020-12-20 ENCOUNTER — Other Ambulatory Visit: Payer: Self-pay

## 2020-12-20 VITALS — BP 140/76 | HR 75 | Ht 63.0 in | Wt 213.0 lb

## 2020-12-20 DIAGNOSIS — R195 Other fecal abnormalities: Secondary | ICD-10-CM

## 2020-12-20 NOTE — Patient Instructions (Signed)
If you are age 59 or older, your body mass index should be between 23-30. Your Body mass index is 37.73 kg/m. If this is out of the aforementioned range listed, please consider follow up with your Primary Care Provider.  If you are age 69 or younger, your body mass index should be between 19-25. Your Body mass index is 37.73 kg/m. If this is out of the aformentioned range listed, please consider follow up with your Primary Care Provider.   You have been scheduled for a colonoscopy. Please follow written instructions given to you at your visit today.  Please pick up your prep supplies at the pharmacy within the next 1-3 days. If you use inhalers (even only as needed), please bring them with you on the day of your procedure.  Follow up pending the results of your Colonoscopy.  Thank you for entrusting me with your care and choosing [facility].  Amy Esterwood, PA-C
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
## 2020-12-23 NOTE — Progress Notes (Signed)
Agree with the assessment and plan as outlined by Amy Esterwood, PA-C.  Shirlena Brinegar, DO, FACG  

## 2021-01-03 ENCOUNTER — Other Ambulatory Visit: Payer: Self-pay

## 2021-01-04 ENCOUNTER — Ambulatory Visit: Payer: Medicare PPO | Admitting: Adult Health

## 2021-01-04 ENCOUNTER — Encounter: Payer: Self-pay | Admitting: Adult Health

## 2021-01-04 VITALS — BP 130/77 | HR 64 | Ht 63.0 in | Wt 217.0 lb

## 2021-01-04 DIAGNOSIS — I639 Cerebral infarction, unspecified: Secondary | ICD-10-CM

## 2021-01-04 DIAGNOSIS — I6381 Other cerebral infarction due to occlusion or stenosis of small artery: Secondary | ICD-10-CM

## 2021-01-04 NOTE — Progress Notes (Signed)
I agree with the above plan 

## 2021-01-04 NOTE — Patient Instructions (Signed)
Continue aspirin 81 mg daily  and atorvastatin for secondary stroke prevention  Continue to follow up with PCP regarding cholesterol, blood pressure and diabetes management  Maintain strict control of hypertension with blood pressure goal below 130/90, diabetes with hemoglobin A1c goal below 7.0 % and cholesterol with LDL cholesterol (bad cholesterol) goal below 70 mg/dL.       Followup in the future with me in 6 months or call earlier if needed       Thank you for coming to see [place] at [facility]. I hope we have been able to provide you high quality care today.  You may receive a patient satisfaction survey over the next few weeks. We would appreciate your feedback and comments so that we may continue to improve ourselves and the health of our patients.

## 2021-01-04 NOTE — Progress Notes (Signed)
[facility] [address] [place] [address] [phone]       STROKE FOLLOW UP NOTE  [name] Date of Birth:  19-Mar-1961 Medical Record Number:  [id number]   Reason for Referral: stroke follow up    SUBJECTIVE:   CHIEF COMPLAINT:  Chief Complaint  Patient presents with  . Injections    TR alone Pt is well, having balance issues but having Hip replacement in March. Recovering from stroke well    HPI:   Today, 01/04/2021, [name] [name] returns for 72-month stroke follow-up unaccompanied.  She has been doing well since prior visit without new stroke/TIA symptoms She does report continued gait impairment with ongoing use of cane and plans on undergoing hip replacement on 3/25 She denies any residual RLE weakness post stroke Continues to ambulate with a cane  Reports compliance on aspirin and atorvastatin -denies side effects Blood pressure today 130/77 Glucose levels have been stable and reports recent A1c 7.0  She has since completed sleep smart trial for continued nightly use of CPAP  No further concerns at this time.   History provided for reference purposes only Initial visit 09/05/2020 JM: [name] [name] is being seen for hospital follow-up accompanied by her husband.  She was discharged home on 07/28/2020 with recommendation of outpatient PT and OT.  Reports she has been doing well since discharge with residual right-sided heaviness sensation, right foot dragging with increased fatigue and slower movements on right side.  Also reports increased fatigue and decreased activity tolerance which has been slowly improving.  She was evaluated by PT/OT without additional therapy needs and continues to do exercises at home.  Continues to participate in sleep smart trial reporting tolerance to CPAP.  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  She has follow-up visit in December to further discuss possible replacement procedure.  Denies new or worsening stroke/TIA symptoms.  Completed 3 weeks DAPT and remains on aspirin alone without bleeding or bruising.  Remains on atorvastatin 80 mg daily without myalgias.  Blood pressure today 123/65.  Glucose levels have been stable around 100-120 and had recent lab work by PCP including A1c but unable to view via epic.  No further concerns at this time.  Stroke admission 07/12/2020 [name] [name] is a 60 y.o. female with history of migraine with aura who presented on 07/12/2020 with right-sided weakness and numbness and some difficulty with expressing herself w/ HA. Personally reviewed hospitalization pertinent progress notes, lab work and imaging with summary provided. Evaluated by Dr. Pearlean Brownie with stroke work up revealing left thalamic stroke secondary to small vessel disease. Recommend DAPT for 3 weeks then aspirin alone. Hx of HTN stable with long term BP goal normotensive range. LDL 191 and initiated atorvastatin 80mg  daily. Uncontrolled DM with A1c 11.6. Other stroke risk factors include THC use, former tobacco use, obesity and hx of migraines. No prior stroke history. Patient currently participating in sleep trial randomized to CPAP treatment.  Evaluated by therapy and recommended discharge to CIR for ongoing therapy needs.  Stroke:   L thalamic lacunar infarct secondary to small vessel disease source  Code Stroke CT head No acute abnormality. ASPECTS 10.     CTA head & neck no ELVO. 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 Aortic atherosclerosis.   CT perfusion negative  MRI  Small subacute L thalamic lacune  2D Echo left ventricular ejection fraction 70 to 75%.  No cardiac source of embolism.    LDL 191  HgbA1c  11.6  UDS +THC  VTE prophylaxis - Lovenox 40 mg sq daily   No antithrombotic prior to admission, now on aspirin 325 mg daily. Decrease aspirin  to 81 and add plavix 75 mg daily. Continue DAPT x 3 weeks then aspirin alone  Therapy recommendations: CIR  disposition:  CIR      ROS:   14 system review of systems performed and negative with exception of those listed in HPI  PMH:  Past Medical History:  Diagnosis Date  . Anxiety   . Chickenpox   . Complication of anesthesia    anxiety and panic  . Depression   . Diabetes mellitus without complication (HCC)    diet controlled, no longer needs meds  . Elevated liver enzymes    ABD US done 08/2016: fatty liver  . Fatty liver    ABD US done 08/2016: fatty liver  . GERD (gastroesophageal reflux disease)   . Hyperlipidemia    no longer needs meds  . Hypertension    pt states no longer needs meds  . Hypothyroidism   . OA (osteoarthritis) of hip    right  . Obesity   . Schizophrenia (HCC)   . Sleep apnea    uses a CPAP  . 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 Thyroid disease    no meds    PSH:  Past Surgical History:  Procedure Laterality Date  . BREAST BIOPSY Right    needle bx more than 15 yrs benign, no scar visible   . LACERATION REPAIR N/A 12/13/2017   Procedure: Closure of nasal lacerations with one rotator graft and one advanced flap with skin graft, closure of left nasal laceration, closure of left ear.;  Surgeon: Suzanna Obey, MD;  Location: [facility];  Service: ENT;  Laterality: N/A;    Social History:  Social History   Socioeconomic History  . Marital status: Married    Spouse name: Smitty Cords  . Number of children: 1  . Years of education: Not on file  . Highest education level: Not on file  Occupational History  . Occupation: former Research scientist (life sciences)  Tobacco Use  . Smoking status: Former Smoker    Quit date: 2004    Years since quitting: 18.1  . Smokeless tobacco: Never Used  Vaping Use  . Vaping Use: Never used  Substance and Sexual Activity  . Alcohol use: Yes    Alcohol/week: 3.0 standard drinks    Types: 1 Glasses of wine, 1 Cans of beer,  1 Shots of liquor per week    Comment: social once month  . Drug use: No    Comment: THC edible 10mg   . Sexual activity: Yes    Partners: Male  Other Topics Concern  . Not on file  Social History Narrative  . Not on file   Social Determinants of Health   Financial Resource Strain: Not on file  Food Insecurity: Not on file  Transportation Needs: Not on file  Physical Activity: Not on file  Stress: Not on file  Social Connections: Not on file  Intimate Partner Violence: Not on file    Family History:  Family History  Problem Relation Age of Onset  . Alcohol abuse Mother   . Arthritis Mother   . Diabetes Mother   . Hypertension Mother   . Hyperlipidemia Mother   . Colon polyps Mother   .  Other Mother        blood clotting disorder  . Alcohol abuse Father   . Arthritis Father   . Asthma Father   . Heart disease Father   . Hypertension Father   . Hyperlipidemia Father   . Alcohol abuse Brother   . Arthritis Brother   . Drug abuse Brother     Medications:   Current Outpatient Medications on File Prior to Visit  Medication Sig Dispense Refill  . aspirin 81 MG EC tablet Take 1 tablet (81 mg total) by mouth daily. Swallow whole. 100 tablet 11  . atorvastatin (LIPITOR) 80 MG tablet Take 1 tablet (80 mg total) by mouth daily. 30 tablet 0  . empagliflozin (JARDIANCE) 25 MG TABS tablet Take by mouth daily.    . insulin glargine (LANTUS) 100 UNIT/ML Solostar Pen Inject 21 Units into the skin daily. (Patient taking differently: Inject 25 Units into the skin daily.) 15 mL 0  . Insulin Pen Needle (ADVOCATE INSULIN PEN NEEDLES) 31G X 5 MM MISC 1 application by Does not apply route daily. 100 each 0  . OVER THE COUNTER MEDICATION Pre/Pro biotic One daily    . OVER THE COUNTER MEDICATION THC 35mg  nightly chewable tablet    . Vitamin D, Ergocalciferol, (DRISDOL) 1.25 MG (50000 UNIT) CAPS capsule Take 50,000 Units by mouth every 7 (seven) days.     No current facility-administered  medications on file prior to visit.    Allergies:   Allergies  Allergen Reactions  . Citric Acid   . Dairy Aid [Lactase]   . Metformin And Related     Nausea, diarrhea, malaise      OBJECTIVE:  Physical Exam  Vitals:   01/04/21 0844  BP: 130/77  Pulse: 64  Weight: 217 lb (98.4 kg)  Height: 5\' 3"  (1.6 m)   Body mass index is 38.44 kg/m. No exam data present  General: Obese pleasant middle-age female, seated, in no evident distress Head: head normocephalic and atraumatic.   Neck: supple with no carotid or supraclavicular bruits Cardiovascular: regular rate and rhythm, no murmurs Musculoskeletal: no deformity Skin:  no rash/petichiae Vascular:  Normal pulses all extremities   Neurologic Exam Mental Status: Awake and fully alert. Fluent speech and language. Oriented to place and time. Recent and remote memory intact. Attention span, concentration and fund of knowledge appropriate. Mood and affect appropriate.  Cranial Nerves: Pupils equal, briskly reactive to light. Extraocular movements full without nystagmus. Visual fields full to confrontation. Hearing intact. Facial sensation intact. Face, tongue, palate moves normally and symmetrically.  Motor: Normal bulk and tone. Normal strength in all tested extremity muscles. Sensory.: intact to touch , pinprick , position and vibratory sensation.  Coordination: Rapid alternating movements normal in all extremities. Finger-to-nose and heel-to-shin performed accurately bilaterally. Gait and Station: Arises from chair without difficulty. Stance is normal. Gait demonstrates  abnormal gait due to favoring of right leg with hip pain and use of cane Reflexes: 1+ and symmetric. Toes downgoing.         ASSESSMENT/PLAN: [name] [name] is a 60 y.o. year old female presented with right-sided weakness/numbness, speech difficulty and headache on 07/12/2020 with stroke work-up revealing left thalamic lacunar infarct secondary to small  vessel disease. Vascular risk factors include HTN, HLD, DM, former tobacco use, obesity and history of migraines.  Currently participating in sleep smart trial randomized to the CPAP treatment group.     1. L thalamic stroke :  a. Recovered well without residual deficit  b. Continue aspirin 81 mg daily  and atorvastatin 80 mg daily for secondary stroke prevention.  c. Discussed secondary stroke prevention measures and importance of close PCP follow up for aggressive stroke risk factor management  2. HTN: BP goal <130/90.  Well-controlled monitor by PCP 3. HLD: LDL goal <70.  Reports recent lipid panel satisfactory obtained by PCP on atorvastatin 80 mg daily (unable to view via epic) 4. DMII: A1c goal<7.0. Recent A1c 7.0 (per pt report) down from 11.6. On Jardiance and Lantus per PCP 5. R hip osteoarthritis: Plans on pursuing hip arthroplasty 3/25 by Dr. Magnus Ivan    Follow up in 6 months or call earlier if needed   CC:  [facility] provider: Dr. Elmon Else, Marcos Eke, MD    I spent 30 minutes of face-to-face and non-face-to-face time with patient.  This included previsit chart review, lab review, study review, order entry, electronic health record documentation, patient education regarding recent stroke and etiology, continued right hip pain, importance of managing stroke risk factors and answered all other questions to patient satisfaction  Ihor Austin, [facility]  [facility] [address] [place], [place] [address]  Phone [phone] Fax [phone] Note: This document was prepared with digital dictation and possible smart phrase technology. Any transcriptional errors that result from this process are unintentional.

## 2021-01-10 ENCOUNTER — Encounter: Payer: Self-pay | Admitting: Gastroenterology

## 2021-01-11 ENCOUNTER — Encounter: Payer: Medicare PPO | Admitting: Physical Medicine & Rehabilitation

## 2021-01-12 ENCOUNTER — Encounter: Payer: Self-pay | Admitting: Gastroenterology

## 2021-01-12 ENCOUNTER — Other Ambulatory Visit: Payer: Self-pay

## 2021-01-12 ENCOUNTER — Ambulatory Visit (AMBULATORY_SURGERY_CENTER): Payer: Medicare PPO | Admitting: Gastroenterology

## 2021-01-12 VITALS — BP 111/61 | HR 78 | Temp 97.9°F | Resp 10 | Ht 63.0 in | Wt 213.0 lb

## 2021-01-12 DIAGNOSIS — K5520 Angiodysplasia of colon without hemorrhage: Secondary | ICD-10-CM

## 2021-01-12 DIAGNOSIS — K644 Residual hemorrhoidal skin tags: Secondary | ICD-10-CM

## 2021-01-12 DIAGNOSIS — D128 Benign neoplasm of rectum: Secondary | ICD-10-CM

## 2021-01-12 DIAGNOSIS — D125 Benign neoplasm of sigmoid colon: Secondary | ICD-10-CM

## 2021-01-12 DIAGNOSIS — K640 First degree hemorrhoids: Secondary | ICD-10-CM

## 2021-01-12 DIAGNOSIS — R195 Other fecal abnormalities: Secondary | ICD-10-CM

## 2021-01-12 DIAGNOSIS — D129 Benign neoplasm of anus and anal canal: Secondary | ICD-10-CM

## 2021-01-12 DIAGNOSIS — K5730 Diverticulosis of large intestine without perforation or abscess without bleeding: Secondary | ICD-10-CM

## 2021-01-12 MED ORDER — SODIUM CHLORIDE 0.9 % IV SOLN: 500.0000 mL | Freq: Once | INTRAVENOUS | Status: DC

## 2021-01-12 NOTE — Patient Instructions (Signed)
Thank you for letting us take care of your healthcare needs today. ?Please see handouts given to you on Polyps, Diverticulosis and Hemorrhoids. ? ? ? ?YOU HAD AN ENDOSCOPIC PROCEDURE TODAY AT THE [facility]:   Refer to the procedure report that was given to you for any specific questions about what was found during the examination.  If the procedure report does not answer your questions, please call your gastroenterologist to clarify.  If you requested that your care partner not be given the details of your procedure findings, then the procedure report has been included in a sealed envelope for you to review at your convenience later. ? ?YOU SHOULD EXPECT: Some feelings of bloating in the abdomen. Passage of more gas than usual.  Walking can help get rid of the air that was put into your GI tract during the procedure and reduce the bloating. If you had a lower endoscopy (such as a colonoscopy or flexible sigmoidoscopy) you may notice spotting of blood in your stool or on the toilet paper. If you underwent a bowel prep for your procedure, you may not have a normal bowel movement for a few days. ? ?Please Note:  You might notice some irritation and congestion in your nose or some drainage.  This is from the oxygen used during your procedure.  There is no need for concern and it should clear up in a day or so. ? ?SYMPTOMS TO REPORT IMMEDIATELY: ? ?Following lower endoscopy (colonoscopy or flexible sigmoidoscopy): ? Excessive amounts of blood in the stool ? Significant tenderness or worsening of abdominal pains ? Swelling of the abdomen that is new, acute ? Fever of 100?F or higher ? ? ?For urgent or emergent issues, a gastroenterologist can be reached at any hour by calling [phone]. ?Do not use MyChart messaging for urgent concerns.  ? ? ?DIET:  We do recommend a small meal at first, but then you may proceed to your regular diet.  Drink plenty of fluids but you should avoid alcoholic beverages for  24 hours. ? ?ACTIVITY:  You should plan to take it easy for the rest of today and you should NOT DRIVE or use heavy machinery until tomorrow (because of the sedation medicines used during the test).   ? ?FOLLOW UP: ?Our staff will call the number listed on your records 48-72 hours following your procedure to check on you and address any questions or concerns that you may have regarding the information given to you following your procedure. If we do not reach you, we will leave a message.  We will attempt to reach you two times.  During this call, we will ask if you have developed any symptoms of COVID 19. If you develop any symptoms (ie: fever, flu-like symptoms, shortness of breath, cough etc.) before then, please call [phone].  If you test positive for Covid 19 in the 2 weeks post procedure, please call and report this information to us.   ? ?If any biopsies were taken you will be contacted by phone or by letter within the next 1-3 weeks.  Please call us at [phone] if you have not heard about the biopsies in 3 weeks.  ? ? ?SIGNATURES/CONFIDENTIALITY: ?You and/or your care partner have signed paperwork which will be entered into your electronic medical record.  These signatures attest to the fact that that the information above on your After Visit Summary has been reviewed and is understood.  Full responsibility of the confidentiality of this discharge   discharge information lies with you and/or your care-partner. 

## 2021-01-12 NOTE — Progress Notes (Signed)
Called to room to assist during endoscopic procedure.  Patient ID and intended procedure confirmed with present staff. Received instructions for my participation in the procedure from the performing physician.  

## 2021-01-12 NOTE — Progress Notes (Signed)
A/ox3, pleased with MAC, report to RN 

## 2021-01-12 NOTE — Op Note (Signed)
[facility] Patient Name: [name] [name] Procedure Date: 01/12/2021 11:48 AM MRN: [id number] Endoscopist: Doristine Locks , MD Age: 60 Referring MD:  Date of Birth: Jun 01, 1961 Gender: Female Account #: [id number] Procedure:                Colonoscopy Indications:              This is the patient's first colonoscopy, Positive                            Cologuard test Medicines:                Monitored Anesthesia Care Procedure:                Pre-Anesthesia Assessment:                           - Prior to the procedure, a History and Physical                            was performed, and patient medications and                            allergies were reviewed. The patient's tolerance of                            previous anesthesia was also reviewed. The risks                            and benefits of the procedure and the sedation                            options and risks were discussed with the patient.                            All questions were answered, and informed consent                            was obtained. Prior Anticoagulants: The patient has                            taken no previous anticoagulant or antiplatelet                            agents. ASA Grade Assessment: II - A patient with                            mild systemic disease. After reviewing the risks                            and benefits, the patient was deemed in                            satisfactory condition to undergo the procedure.  After obtaining informed consent, the colonoscope                            was passed under direct vision. Throughout the                            procedure, the patient's blood pressure, pulse, and                            oxygen saturations were monitored continuously. The                            Olympus CF-HQ190L [id number]) Colonoscope was                            introduced through the anus and advanced to the  the                            cecum, identified by appendiceal orifice and                            ileocecal valve. The colonoscopy was performed                            without difficulty. The patient tolerated the                            procedure well. The quality of the bowel                            preparation was good. The ileocecal valve,                            appendiceal orifice, and rectum were photographed. Scope In: 11:54:53 AM Scope Out: 12:19:22 PM Scope Withdrawal Time: 0 hours 16 minutes 59 seconds  Total Procedure Duration: 0 hours 24 minutes 29 seconds  Findings:                 Skin tags were found on perianal exam.                           Five sessile polyps were found in the sigmoid                            colon. The polyps were 4 to 8 mm in size. These                            polyps were removed with a cold snare. Resection                            and retrieval were complete. Estimated blood loss                            was minimal.  A 12 mm polyp was found in the rectum. The polyp                            was sessile. The polyp was removed with a cold                            snare. Resection and retrieval were complete.                            Estimated blood loss was minimal.                           A few small-mouthed diverticula were found in the                            sigmoid colon.                           Multiple angioectasias without bleeding were found                            in the descending colon, in the transverse colon,                            in the ascending colon and in the cecum.                           Non-bleeding internal hemorrhoids were found during                            retroflexion. The hemorrhoids were small. Complications:            No immediate complications. Estimated Blood Loss:     Estimated blood loss was minimal. Impression:               -  Perianal skin tags found on perianal exam.                           - Five 4 to 8 mm polyps in the sigmoid colon,                            removed with a cold snare. Resected and retrieved.                           - One 12 mm polyp in the rectum, removed with a                            cold snare. Resected and retrieved.                           - Diverticulosis in the sigmoid colon.                           - Multiple non-bleeding colonic angioectasias.                           -  Non-bleeding internal hemorrhoids. Recommendation:           - Patient has a contact number available for                            emergencies. The signs and symptoms of potential                            delayed complications were discussed with the                            patient. Return to normal activities tomorrow.                            Written discharge instructions were provided to the                            patient.                           - Resume previous diet.                           - Continue present medications.                           - Await pathology results.                           - Repeat colonoscopy for surveillance based on                            pathology results.                           - Return to GI clinic PRN. Doristine Locks, MD 01/12/2021 12:25:33 PM

## 2021-01-13 ENCOUNTER — Telehealth: Payer: Self-pay | Admitting: Gastroenterology

## 2021-01-13 NOTE — Telephone Encounter (Signed)
Called patient back and she said she noticed blood in the toilet after having a BM. She explained that it was a deep color of red and seemed like a lot to her. After asking questions, she is not bleeding in between Bm's and is not having any pain. I advised her that since that polyp was in the rectum this all sounded normal but that I would let the MD know. I also advised her to let us know if the amount increased or if she started noticing blood without having a BM. She agreed and felt better after talking.

## 2021-01-16 ENCOUNTER — Telehealth: Payer: Self-pay

## 2021-01-16 NOTE — Telephone Encounter (Signed)
LVM

## 2021-01-17 NOTE — Telephone Encounter (Signed)
Agree with plan as outlined.  12 mm rectal polyp that was removed along with several smaller sigmoid polyps.  Can we please follow-up with the patient this week to make sure symptoms have resolved and no further concerns.  Thanks.

## 2021-01-18 ENCOUNTER — Telehealth: Payer: Self-pay

## 2021-01-18 NOTE — Telephone Encounter (Signed)
Called patient back and she has not had any further bleeding, she said everything was fine.

## 2021-01-24 ENCOUNTER — Other Ambulatory Visit: Payer: Self-pay | Admitting: Physician Assistant

## 2021-01-25 ENCOUNTER — Encounter: Payer: Self-pay | Admitting: Gastroenterology

## 2021-01-26 NOTE — Patient Instructions (Addendum)
DUE TO COVID-19 ONLY ONE VISITOR IS ALLOWED TO COME WITH YOU AND STAY IN THE WAITING ROOM ONLY DURING PRE OP AND PROCEDURE.   IF YOU WILL BE ADMITTED INTO THE HOSPITAL YOU ARE ALLOWED ONLY TWO SUPPORT PEOPLE DURING VISITATION HOURS ONLY (10AM -8PM)   . The support person(s) may change daily. . The support person(s) must pass our screening, gel in and out, and wear a mask at all times, including in the patient's room. . Patients must also wear a mask when staff or their support person are in the room.  No visitors under the age of 79. Any visitor under the age of 93 must be accompanied by an adult.    COVID SWAB TESTING MUST BE COMPLETED ON:  Tuesday, 01-31-21 @ 2:45 PM   [address] [place], [place] [address]  (Must self quarantine after testing. Follow instructions on handout.)        Your procedure is scheduled on:  Friday, 02-03-21   Report to [facility] Main  Entrance    Report to admitting at 6:00 AM   Call this number if you have problems the morning of surgery [phone]   Do not eat food :After Midnight.   May have liquids until 5:30 AM  day of surgery  CLEAR LIQUID DIET  Foods Allowed                                                                     Foods Excluded  Water, Black Coffee and tea, regular and decaf            liquids that you cannot  Plain Jell-O in any flavor  (No red)                                   see through such as: Fruit ices (not with fruit pulp)                                      milk, soups, orange juice              Iced Popsicles (No red)                                      All solid food                                   Apple juices Sports drinks like Gatorade (No red) Lightly seasoned clear broth or consume(fat free) Sugar, honey syrup     Oral Hygiene is also important to reduce your risk of infection.                                    Remember - BRUSH YOUR TEETH THE MORNING OF SURGERY WITH YOUR REGULAR  TOOTHPASTE   Do NOT smoke after Midnight   Take these  medicines the morning of surgery with A SIP OF WATER:  Atorvastatin   How to Manage Your Diabetes Before and After Surgery  Why is it important to control my blood sugar before and after surgery? . Improving blood sugar levels before and after surgery helps healing and can limit problems. . A way of improving blood sugar control is eating a healthy diet by: o  Eating less sugar and carbohydrates o  Increasing activity/exercise o  Talking with your doctor about reaching your blood sugar goals . High blood sugars (greater than 180 mg/dL) can raise your risk of infections and slow your recovery, so you will need to focus on controlling your diabetes during the weeks before surgery. . Make sure that the doctor who takes care of your diabetes knows about your planned surgery including the date and location.  How do I manage my blood sugar before surgery? . Check your blood sugar at least 4 times a day, starting 2 days before surgery, to make sure that the level is not too high or low. o Check your blood sugar the morning of your surgery when you wake up and every 2 hours until you get to the Short Stay unit. . If your blood sugar is less than 70 mg/dL, you will need to treat for low blood sugar: o Do not take insulin. o Treat a low blood sugar (less than 70 mg/dL) with  cup of clear juice (cranberry or apple), 4 glucose tablets, OR glucose gel. o Recheck blood sugar in 15 minutes after treatment (to make sure it is greater than 70 mg/dL). If your blood sugar is not greater than 70 mg/dL on recheck, call [phone] for further instructions. . Report your blood sugar to the short stay nurse when you get to Short Stay.  . If you are admitted to the hospital after surgery: o Your blood sugar will be checked by the staff and you will probably be given insulin after surgery (instead of oral diabetes medicines) to make sure you have good blood  sugar levels. o The goal for blood sugar control after surgery is 80-180 mg/dL.   WHAT DO I DO ABOUT MY DIABETES MEDICATION?  Marland Kitchen Do not take oral diabetes medicines (pills) the morning of surgery.  . THE DAY BEFORE SURGERY:  Do not take Jardiance               Insulin Glargine take 21 units of  insulin.       . THE MORNING OF SURGERY:  Do not take Jardiance        Take 10  units of Insulin Glargine.   Reviewed and Endorsed by [facility] Patient Education Committee, August 2015                               You may not have any metal on your body including hair pins, jewelry, and body piercings             Do not wear make-up, lotions, powders, perfumes/cologne, or deodorant             Do not wear nail polish.  Do not shave  48 hours prior to surgery.              Men may shave face and neck.   Do not bring valuables to the hospital. [facility] IS NOT  RESPONSIBLE   FOR VALUABLES.   Contacts, dentures or bridgework may not be worn into surgery.   Bring small overnight bag day of surgery.   Bring CPAP mask and tubing day of surgery    Patients discharged the day of surgery will not be allowed to drive home.              Please read over the following fact sheets you were given: IF YOU HAVE QUESTIONS ABOUT YOUR PRE OP INSTRUCTIONS PLEASE CALL  [facility] - Preparing for Surgery Before surgery, you can play an important role.  Because skin is not sterile, your skin needs to be as free of germs as possible.  You can reduce the number of germs on your skin by washing with CHG (chlorahexidine gluconate) soap before surgery.  CHG is an antiseptic cleaner which kills germs and bonds with the skin to continue killing germs even after washing. Please DO NOT use if you have an allergy to CHG or antibacterial soaps.  If your skin becomes reddened/irritated stop using the CHG and inform your nurse when you arrive at Short Stay. Do not shave (including legs  and underarms) for at least 48 hours prior to the first CHG shower.  You may shave your face/neck.  Please follow these instructions carefully:  1.  Shower with CHG Soap the night before surgery and the  morning of surgery.  2.  If you choose to wash your hair, wash your hair first as usual with your normal  shampoo.  3.  After you shampoo, rinse your hair and body thoroughly to remove the shampoo.                             4.  Use CHG as you would any other liquid soap.  You can apply chg directly to the skin and wash.  Gently with a scrungie or clean washcloth.  5.  Apply the CHG Soap to your body ONLY FROM THE NECK DOWN.   Do   not use on face/ open                           Wound or open sores. Avoid contact with eyes, ears mouth and   genitals (private parts).                       Wash face,  Genitals (private parts) with your normal soap.             6.  Wash thoroughly, paying special attention to the area where your    surgery  will be performed.  7.  Thoroughly rinse your body with warm water from the neck down.  8.  DO NOT shower/wash with your normal soap after using and rinsing off the CHG Soap.                9.  Pat yourself dry with a clean towel.            10.  Wear clean pajamas.            11.  Place clean sheets on your bed the night of your first shower and do not  sleep with pets. Day of Surgery : Do not apply any lotions/deodorants the morning of surgery.  Please wear clean clothes to the hospital/surgery center.  FAILURE TO  FOLLOW THESE INSTRUCTIONS MAY RESULT IN THE CANCELLATION OF YOUR SURGERY  PATIENT SIGNATURE_________________________________  NURSE SIGNATURE__________________________________  ________________________________________________________________________   Adam Phenix  An incentive spirometer is a tool that can help keep your lungs clear and active. This tool measures how well you are filling your lungs with each breath. Taking long  deep breaths may help reverse or decrease the chance of developing breathing (pulmonary) problems (especially infection) following:  A long period of time when you are unable to move or be active. BEFORE THE PROCEDURE   If the spirometer includes an indicator to show your best effort, your nurse or respiratory therapist will set it to a desired goal.  If possible, sit up straight or lean slightly forward. Try not to slouch.  Hold the incentive spirometer in an upright position. INSTRUCTIONS FOR USE  1. Sit on the edge of your bed if possible, or sit up as far as you can in bed or on a chair. 2. Hold the incentive spirometer in an upright position. 3. Breathe out normally. 4. Place the mouthpiece in your mouth and seal your lips tightly around it. 5. Breathe in slowly and as deeply as possible, raising the piston or the ball toward the top of the column. 6. Hold your breath for 3-5 seconds or for as long as possible. Allow the piston or ball to fall to the bottom of the column. 7. Remove the mouthpiece from your mouth and breathe out normally. 8. Rest for a few seconds and repeat Steps 1 through 7 at least 10 times every 1-2 hours when you are awake. Take your time and take a few normal breaths between deep breaths. 9. The spirometer may include an indicator to show your best effort. Use the indicator as a goal to work toward during each repetition. 10. After each set of 10 deep breaths, practice coughing to be sure your lungs are clear. If you have an incision (the cut made at the time of surgery), support your incision when coughing by placing a pillow or rolled up towels firmly against it. Once you are able to get out of bed, walk around indoors and cough well. You may stop using the incentive spirometer when instructed by your caregiver.  RISKS AND COMPLICATIONS  Take your time so you do not get dizzy or light-headed.  If you are in pain, you may need to take or ask for pain medication  before doing incentive spirometry. It is harder to take a deep breath if you are having pain. AFTER USE  Rest and breathe slowly and easily.  It can be helpful to keep track of a log of your progress. Your caregiver can provide you with a simple table to help with this. If you are using the spirometer at home, follow these instructions: [place] IF:   You are having difficultly using the spirometer.  You have trouble using the spirometer as often as instructed.  Your pain medication is not giving enough relief while using the spirometer.  You develop fever of 100.5 F (38.1 C) or higher. SEEK IMMEDIATE MEDICAL CARE IF:   You cough up bloody sputum that had not been present before.  You develop fever of 102 F (38.9 C) or greater.  You develop worsening pain at or near the incision site. MAKE SURE YOU:   Understand these instructions.  Will watch your condition.  Will get help right away if you are not doing well or get worse. Document Released: 03/11/2007 Document  Revised: 01/21/2012 Document Reviewed: 05/12/2007 Exit[employer] Patient Information 2014 [employer], [place].   ________________________________________________________________________  WHAT IS A BLOOD TRANSFUSION? Blood Transfusion Information  A transfusion is the replacement of blood or some of its parts. Blood is made up of multiple cells which provide different functions.  Red blood cells carry oxygen and are used for blood loss replacement.  White blood cells fight against infection.  Platelets control bleeding.  Plasma helps clot blood.  Other blood products are available for specialized needs, such as hemophilia or other clotting disorders. BEFORE THE TRANSFUSION  Who gives blood for transfusions?   Healthy volunteers who are fully evaluated to make sure their blood is safe. This is blood bank blood. Transfusion therapy is the safest it has ever been in the practice of medicine. Before blood is  taken from a donor, a complete history is taken to make sure that person has no history of diseases nor engages in risky social behavior (examples are intravenous drug use or sexual activity with multiple partners). The donor's travel history is screened to minimize risk of transmitting infections, such as malaria. The donated blood is tested for signs of infectious diseases, such as HIV and hepatitis. The blood is then tested to be sure it is compatible with you in order to minimize the chance of a transfusion reaction. If you or a relative donates blood, this is often done in anticipation of surgery and is not appropriate for emergency situations. It takes many days to process the donated blood. RISKS AND COMPLICATIONS Although transfusion therapy is very safe and saves many lives, the main dangers of transfusion include:   Getting an infectious disease.  Developing a transfusion reaction. This is an allergic reaction to something in the blood you were given. Every precaution is taken to prevent this. The decision to have a blood transfusion has been considered carefully by your caregiver before blood is given. Blood is not given unless the benefits outweigh the risks. AFTER THE TRANSFUSION  Right after receiving a blood transfusion, you will usually feel much better and more energetic. This is especially true if your red blood cells have gotten low (anemic). The transfusion raises the level of the red blood cells which carry oxygen, and this usually causes an energy increase.  The nurse administering the transfusion will monitor you carefully for complications. HOME CARE INSTRUCTIONS  No special instructions are needed after a transfusion. You may find your energy is better. Speak with your caregiver about any limitations on activity for underlying diseases you may have. SEEK MEDICAL CARE IF:   Your condition is not improving after your transfusion.  You develop redness or irritation at the  intravenous (IV) site. SEEK IMMEDIATE MEDICAL CARE IF:  Any of the following symptoms occur over the next 12 hours:  Shaking chills.  You have a temperature by mouth above 102 F (38.9 C), not controlled by medicine.  Chest, back, or muscle pain.  People around you feel you are not acting correctly or are confused.  Shortness of breath or difficulty breathing.  Dizziness and fainting.  You get a rash or develop hives.  You have a decrease in urine output.  Your urine turns a dark color or changes to pink, red, or brown. Any of the following symptoms occur over the next 10 days:  You have a temperature by mouth above 102 F (38.9 C), not controlled by medicine.  Shortness of breath.  Weakness after normal activity.  The white part of the eye  turns yellow (jaundice).  You have a decrease in the amount of urine or are urinating less often.  Your urine turns a dark color or changes to pink, red, or brown. Document Released: 10/26/2000 Document Revised: 01/21/2012 Document Reviewed: 06/14/2008 [facility] Patient Information 2014 [place], [place].  _______________________________________________________________________

## 2021-01-26 NOTE — Progress Notes (Addendum)
COVID Vaccine Completed: x3 Date COVID Vaccine completed:  01-25-20 02-15-20 Has received booster:08-29-20 COVID vaccine manufacturer: [employer]     Date of COVID positive in last 90 days: N/A  PCP - Horald Pollen, MD Cardiologist - N/A  Chest x-ray - 07-13-20 Epic EKG - 09-07-2020 Epic Stress Test -  ECHO - 07-13-20 Epic Cardiac Cath -  Pacemaker/ICD device last checked: Spinal Cord Stimulator:  Sleep Study - 07-14-20,  +sleep apnea CPAP - Yes  Fasting Blood Sugar - 99 to 136 Checks Blood Sugar-  3 times a day  Blood Thinner Instructions: Aspirin Instructions:  ASA 81 mg Last Dose:  Activity level:  Can go up a flight of stairs and perform activities of daily living without stopping and without symptoms of chest pain or shortness of breath.    Unable to exercise due to hip and knee pain.      Anesthesia review:  Hx of stroke 07/12/20, no deficits from stroke, no difficulty swallowing.  HTN, DM, OSA  Patient denies shortness of breath, fever, cough and chest pain at PAT appointment   Patient verbalized understanding of instructions that were given to them at the PAT appointment. Patient was also instructed that they will need to review over the PAT instructions again at home before surgery.

## 2021-01-27 ENCOUNTER — Other Ambulatory Visit: Payer: Self-pay

## 2021-01-27 ENCOUNTER — Encounter (HOSPITAL_COMMUNITY)
Admission: RE | Admit: 2021-01-27 | Discharge: 2021-01-27 | Disposition: A | Discharge status: Home / Self Care | Payer: Medicare PPO | Source: Ambulatory Visit | Attending: Orthopaedic Surgery | Admitting: Orthopaedic Surgery

## 2021-01-27 ENCOUNTER — Encounter (HOSPITAL_COMMUNITY): Payer: Self-pay

## 2021-01-27 DIAGNOSIS — Z79899 Other long term (current) drug therapy: Secondary | ICD-10-CM | POA: Diagnosis not present

## 2021-01-27 DIAGNOSIS — Z01812 Encounter for preprocedural laboratory examination: Secondary | ICD-10-CM | POA: Diagnosis present

## 2021-01-27 DIAGNOSIS — K760 Fatty (change of) liver, not elsewhere classified: Secondary | ICD-10-CM | POA: Insufficient documentation

## 2021-01-27 DIAGNOSIS — G4733 Obstructive sleep apnea (adult) (pediatric): Secondary | ICD-10-CM | POA: Diagnosis not present

## 2021-01-27 DIAGNOSIS — Z794 Long term (current) use of insulin: Secondary | ICD-10-CM | POA: Insufficient documentation

## 2021-01-27 DIAGNOSIS — Z8673 Personal history of transient ischemic attack (TIA), and cerebral infarction without residual deficits: Secondary | ICD-10-CM | POA: Insufficient documentation

## 2021-01-27 DIAGNOSIS — F209 Schizophrenia, unspecified: Secondary | ICD-10-CM | POA: Diagnosis not present

## 2021-01-27 DIAGNOSIS — Z7982 Long term (current) use of aspirin: Secondary | ICD-10-CM | POA: Insufficient documentation

## 2021-01-27 DIAGNOSIS — I10 Essential (primary) hypertension: Secondary | ICD-10-CM | POA: Insufficient documentation

## 2021-01-27 DIAGNOSIS — M1611 Unilateral primary osteoarthritis, right hip: Secondary | ICD-10-CM | POA: Diagnosis not present

## 2021-01-27 DIAGNOSIS — Z7984 Long term (current) use of oral hypoglycemic drugs: Secondary | ICD-10-CM | POA: Diagnosis not present

## 2021-01-27 DIAGNOSIS — E119 Type 2 diabetes mellitus without complications: Secondary | ICD-10-CM | POA: Insufficient documentation

## 2021-01-27 HISTORY — DX: Bitten by dog, initial encounter: W54.0XXA

## 2021-01-27 HISTORY — DX: Open bite of other part of head, initial encounter: S01.85XA

## 2021-01-27 HISTORY — DX: Personal history of urinary calculi: Z87.442

## 2021-01-27 LAB — BASIC METABOLIC PANEL
Anion gap: 11 (ref 5–15)
BUN: 20 mg/dL (ref 6–20)
CO2: 23 mmol/L (ref 22–32)
Calcium: 9.4 mg/dL (ref 8.9–10.3)
Chloride: 107 mmol/L (ref 98–111)
Creatinine, Ser: 0.61 mg/dL (ref 0.44–1.00)
GFR, Estimated: >60 mL/min (ref >60)
Glucose, Bld: 111 mg/dL — ABNORMAL HIGH (ref 70–99)
Potassium: 4.5 mmol/L (ref 3.5–5.1)
Sodium: 141 mmol/L (ref 135–145)

## 2021-01-27 LAB — SURGICAL PCR SCREEN
MRSA, PCR: NEGATIVE
Staphylococcus aureus: NEGATIVE

## 2021-01-27 LAB — HEMOGLOBIN A1C
Hgb A1c MFr Bld: 6.3 % — ABNORMAL HIGH (ref 4.8–5.6)
Mean Plasma Glucose: 134.11 mg/dL

## 2021-01-27 LAB — CBC
HCT: 41.1 % (ref 36.0–46.0)
Hemoglobin: 12.9 g/dL (ref 12.0–15.0)
MCH: 29.9 pg (ref 26.0–34.0)
MCHC: 31.4 g/dL (ref 30.0–36.0)
MCV: 95.4 fL (ref 80.0–100.0)
Platelets: 275 10*3/uL (ref 150–400)
RBC: 4.31 MIL/uL (ref 3.87–5.11)
RDW: 14 % (ref 11.5–15.5)
WBC: 7.9 10*3/uL (ref 4.0–10.5)
nRBC: 0 % (ref 0.0–0.2)

## 2021-01-27 LAB — GLUCOSE, CAPILLARY: Glucose-Capillary: 105 mg/dL — ABNORMAL HIGH (ref 70–99)

## 2021-01-30 ENCOUNTER — Encounter (HOSPITAL_COMMUNITY): Payer: Self-pay

## 2021-01-30 NOTE — Progress Notes (Signed)
Anesthesia Chart Review:   Case: [id number] Date/Time: 02/03/21 0815   Procedure: RIGHT TOTAL HIP ARTHROPLASTY ANTERIOR APPROACH (Right Hip)   Anesthesia type: Spinal   Pre-op diagnosis: OSTEOARTHRITIS OF RIGHT HIP   Location: [place] [facility] / [facility]   Surgeons: Mcarthur Rossetti, MD      DISCUSSION: Pt is 60 years old with hx HTN, DM, stroke 07/12/20 (no residual deficit), fatty liver, schizophrenia, OSA  VS: BP (!) 152/84   Pulse 71   Temp 37.2 C (Oral)   Resp 16   Ht 5\' 3"  (1.6 m)   Wt 96.8 kg   SpO2 98%   BMI 37.80 kg/m    PROVIDERS: - PCP is Hayden Rasmussen, MD  - Sees neurology, last office visit 01/04/21 with Frann Rider, NP. [name] [name] is aware of upcoming surgery   LABS: Labs reviewed: Acceptable for surgery. (all labs ordered are listed, but only abnormal results are displayed)  Labs Reviewed  HEMOGLOBIN A1C - Abnormal; Notable for the following components:      Result Value   Hgb A1c MFr Bld 6.3 (*)    All other components within normal limits  BASIC METABOLIC PANEL - Abnormal; Notable for the following components:   Glucose, Bld 111 (*)    All other components within normal limits  GLUCOSE, CAPILLARY - Abnormal; Notable for the following components:   Glucose-Capillary 105 (*)    All other components within normal limits  SURGICAL PCR SCREEN  CBC  TYPE AND SCREEN     IMAGES: MR brain 09/07/20:  1. No acute intracranial abnormality. 2. Focus of chronic microhemorrhage in the right frontal white matter, nonspecific.  CT head, cervical spine, maxillofacial 09/06/20:  1. No acute intracranial abnormality. 2. Expected evolution of a previously identified remote left thalamic infarct. 3. Minimally displaced fractures of the bilateral nasal bones with overlying soft tissue swelling and laceration. 4. No other acute facial bone fracture. 5. No acute fracture or traumatic listhesis of the cervical spine. 6. Some early spondylitic changes without  significant central canal or foraminal stenosis. 7. Multitude of chronically absent dentition as well as impacted bilateral third maxillary and right third mandibular molars. 8. Cervical and intracranial atherosclerosis  CXR 07/13/20: No acute intrathoracic process.   EKG 09/06/20: Sinus rhythm. Borderline T wave abnormalities. Baseline wander in lead(s) V5   CV: Echo 07/13/20:  1. Left ventricular ejection fraction, by estimation, is 70 to 75%. The left ventricle has hyperdynamic function. The left ventricle has no regional wall motion abnormalities. There is mild concentric left ventricular hypertrophy. Left ventricular diastolic parameters are consistent with Grade II diastolic dysfunction (pseudonormalization). Elevated left atrial pressure.  2. Right ventricular systolic function is normal. The right ventricular size is normal.  3. The mitral valve is degenerative. No evidence of mitral valve regurgitation. No evidence of mitral stenosis.  4. The aortic valve is normal in structure. Aortic valve regurgitation is not visualized. No aortic stenosis is present   Past Medical History:  Diagnosis Date  . Anxiety   . Chickenpox   . Complication of anesthesia    anxiety and panic  . Depression   . Diabetes mellitus without complication ([place])   . Dog bite of face   . Elevated liver enzymes    ABD US done 08/2016: fatty liver  . Fatty liver    ABD US done 08/2016: fatty liver  . History of kidney stones   . Hyperlipidemia    no longer needs meds  .  Hypertension    pt states no longer needs meds  . Hypothyroidism    No longer takes meds  . OA (osteoarthritis) of hip    right  . Obesity   . Schizophrenia ([place])   . Sleep apnea    uses a CPAP  . Stroke (cerebrum) ([place]) 07/12/2020  . Thyroid disease    no meds    Past Surgical History:  Procedure Laterality Date  . BREAST BIOPSY Right    needle bx more than 15 yrs benign, no scar visible   . LACERATION REPAIR N/A 12/13/2017    Procedure: Closure of nasal lacerations with one rotator graft and one advanced flap with skin graft, closure of left nasal laceration, closure of left ear.;  Surgeon: [name], MD;  Location: [facility];  Service: ENT;  Laterality: N/A;    MEDICATIONS: . acetaminophen (TYLENOL) 500 MG tablet  . aspirin 81 MG EC tablet  . atorvastatin (LIPITOR) 80 MG tablet  . empagliflozin (JARDIANCE) 25 MG TABS tablet  . insulin glargine (LANTUS) 100 UNIT/ML Solostar Pen  . Insulin Pen Needle (ADVOCATE INSULIN PEN NEEDLES) 31G X 5 MM MISC  . OVER THE COUNTER MEDICATION  . OVER THE COUNTER MEDICATION  . Vitamin D, Ergocalciferol, (DRISDOL) 1.25 MG (50000 UNIT) CAPS capsule   No current facility-administered medications for this encounter.    If no changes, I anticipate pt can proceed with surgery as scheduled.   Willeen Cass, PhD, FNP-BC [facility]/Anesthesiology Phone: [phone] 01/30/2021 10:16 AM

## 2021-01-30 NOTE — Anesthesia Preprocedure Evaluation (Addendum)
Anesthesia Evaluation  Patient identified by MRN, date of birth, ID band Patient awake    Reviewed: Allergy & Precautions, NPO status , Patient's Chart, lab work & pertinent test results  History of Anesthesia Complications (+) history of anesthetic complications (anxiety)  Airway Mallampati: II  TM Distance: >3 FB Neck ROM: Full    Dental no notable dental hx. (+) Teeth Intact, Dental Advisory Given, Missing,    Pulmonary sleep apnea and Continuous Positive Airway Pressure Ventilation , former smoker,    Pulmonary exam normal breath sounds clear to auscultation       Cardiovascular hypertension, +CHF (grade 2 diastolic dysfunction)  Normal cardiovascular exam Rhythm:Regular Rate:Normal  Last echo 1. Left ventricular ejection fraction, by estimation, is 70 to 75%. The  left ventricle has hyperdynamic function. The left ventricle has no  regional wall motion abnormalities. There is mild concentric left  ventricular hypertrophy. Left ventricular  diastolic parameters are consistent with Grade II diastolic dysfunction  (pseudonormalization). Elevated left atrial pressure.  2. Right ventricular systolic function is normal. The right ventricular  size is normal.  3. The mitral valve is degenerative. No evidence of mitral valve  regurgitation. No evidence of mitral stenosis.  4. The aortic valve is normal in structure. Aortic valve regurgitation is  not visualized. No aortic stenosis is present.     BP 190/90 in preop, does not take BP meds. States she has never been this high in the past, has never been on BP meds before    Neuro/Psych PSYCHIATRIC DISORDERS Anxiety Depression Schizophrenia CVA (06/2020), No Residual Symptoms    GI/Hepatic negative GI ROS, Neg liver ROS,   Endo/Other  diabetes, Well Controlled, Type 2, Insulin Dependent, Oral Hypoglycemic AgentsHypothyroidism Obesity BMI 38 10 units lantus this morning  (normal dose is 21 units) Does not normally use short acting sliding scale for meals FS 125 this AM a1c 6.3  Renal/GU negative Renal ROS  negative genitourinary   Musculoskeletal  (+) Arthritis , Osteoarthritis,  OA right hip   Abdominal (+) + obese,   Peds  Hematology negative hematology ROS (+) hct 41.1, plt 275   Anesthesia Other Findings   Reproductive/Obstetrics negative OB ROS                           Anesthesia Physical Anesthesia Plan  ASA: III  Anesthesia Plan: Spinal and MAC   Post-op Pain Management:    Induction:   PONV Risk Score and Plan: 2 and Propofol infusion and TIVA  Airway Management Planned: Natural Airway and Nasal Cannula  Additional Equipment: None  Intra-op Plan:   Post-operative Plan:   Informed Consent: I have reviewed the patients History and Physical, chart, labs and discussed the procedure including the risks, benefits and alternatives for the proposed anesthesia with the patient or authorized representative who has indicated his/her understanding and acceptance.       Plan Discussed with: CRNA  Anesthesia Plan Comments:       Anesthesia Quick Evaluation

## 2021-01-31 ENCOUNTER — Other Ambulatory Visit (HOSPITAL_COMMUNITY)
Admission: RE | Admit: 2021-01-31 | Discharge: 2021-01-31 | Disposition: A | Discharge status: Home / Self Care | Payer: Medicare PPO | Source: Ambulatory Visit | Attending: Orthopaedic Surgery | Admitting: Orthopaedic Surgery

## 2021-01-31 DIAGNOSIS — Z20822 Contact with and (suspected) exposure to covid-19: Secondary | ICD-10-CM | POA: Insufficient documentation

## 2021-01-31 DIAGNOSIS — Z01812 Encounter for preprocedural laboratory examination: Secondary | ICD-10-CM | POA: Insufficient documentation

## 2021-01-31 LAB — SARS CORONAVIRUS 2 (TAT 6-24 HRS): SARS Coronavirus 2: NEGATIVE

## 2021-02-02 ENCOUNTER — Encounter (HOSPITAL_COMMUNITY): Payer: Self-pay | Admitting: Orthopaedic Surgery

## 2021-02-02 NOTE — H&P (Signed)
TOTAL HIP ADMISSION H&P  Patient is admitted for right total hip arthroplasty.  Subjective:  Chief Complaint: right hip pain  HPI: [name] [name], 60 y.o. female, has a history of pain and functional disability in the right hip(s) due to arthritis and patient has failed non-surgical conservative treatments for greater than 12 weeks to include NSAID's and/or analgesics, flexibility and strengthening excercises, use of assistive devices, weight reduction as appropriate and activity modification.  Onset of symptoms was gradual starting 4 years ago with gradually worsening course since that time.The patient noted no past surgery on the right hip(s).  Patient currently rates pain in the right hip at 10 out of 10 with activity. Patient has night pain, worsening of pain with activity and weight bearing, trendelenberg gait, pain that interfers with activities of daily living, pain with passive range of motion and crepitus. Patient has evidence of subchondral cysts, subchondral sclerosis, periarticular osteophytes and joint space narrowing by imaging studies. This condition presents safety issues increasing the risk of falls.  There is no current active infection.  Patient Active Problem List   Diagnosis Date Noted  . Positive colorectal cancer screening using Cologuard test 12/19/2020  . Unilateral primary osteoarthritis, left knee 11/16/2020  . Unilateral primary osteoarthritis, right hip 11/16/2020  . Osteoarthritis of hip 07/28/2020  . Transaminitis   . Erythrocytosis   . Uncontrolled type 2 diabetes mellitus with hyperglycemia, with long-term current use of insulin ([place])   . 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 Schizoaffective disorder ([place]) 07/14/2020  . Acute ischemic stroke ([place]) 07/13/2020  . Dog bite of face 08/13/2018  . Dog bite of ear 08/13/2018  . Diabetes mellitus without complication ([place])   . Mood disorder ([place]) 11/11/2017  . Type 2 diabetes mellitus without complication,  without long-term current use of insulin ([place]) 11/11/2017  . Class 2 obesity with body mass index (BMI) of 37.0 to 37.9 in adult 11/11/2017  . Mixed hyperlipidemia 11/11/2017  . Tinea corporis 11/08/2017  . Elevated liver enzymes 03/12/2015  . HTN (hypertension) 03/11/2015  . Hypothyroidism 12/14/2011   Past Medical History:  Diagnosis Date  . Anxiety   . Chickenpox   . Complication of anesthesia    anxiety and panic  . Depression   . Diabetes mellitus without complication ([place])   . Dog bite of face   . Elevated liver enzymes    ABD US done 08/2016: fatty liver  . Fatty liver    ABD US done 08/2016: fatty liver  . History of kidney stones   . Hyperlipidemia    no longer needs meds  . Hypertension    pt states no longer needs meds  . Hypothyroidism    No longer takes meds  . OA (osteoarthritis) of hip    right  . Obesity   . Polycythemia    Not the vera  . Schizophrenia ([place])   . Sleep apnea    uses a CPAP  . Stroke (cerebrum) ([place]) 07/12/2020  . Thyroid disease    no meds    Past Surgical History:  Procedure Laterality Date  . BREAST BIOPSY Right    needle bx more than 15 yrs benign, no scar visible   . LACERATION REPAIR N/A 12/13/2017   Procedure: Closure of nasal lacerations with one rotator graft and one advanced flap with skin graft, closure of left nasal laceration, closure of left ear.;  Surgeon: [name], MD;  Location: [facility];  Service: ENT;  Laterality: N/A;  No current facility-administered medications for this encounter.   Current Outpatient Medications  Medication Sig Dispense Refill Last Dose  . acetaminophen (TYLENOL) 500 MG tablet Take 1,000 mg by mouth daily.     Marland Kitchen aspirin 81 MG EC tablet Take 1 tablet (81 mg total) by mouth daily. Swallow whole. 100 tablet 11   . atorvastatin (LIPITOR) 80 MG tablet Take 1 tablet (80 mg total) by mouth daily. 30 tablet 0   . empagliflozin (JARDIANCE) 25 MG TABS tablet Take 25 mg by mouth daily.     . insulin  glargine (LANTUS) 100 UNIT/ML Solostar Pen Inject 21 Units into the skin daily. 15 mL 0   . OVER THE COUNTER MEDICATION Take 1 capsule by mouth daily. Pre/Pro biotic One daily     . OVER THE COUNTER MEDICATION Take 25-35 mg by mouth at bedtime. THC nightly chewable tablet     . Vitamin D, Ergocalciferol, (DRISDOL) 1.25 MG (50000 UNIT) CAPS capsule Take 50,000 Units by mouth every Friday.     . Insulin Pen Needle (ADVOCATE INSULIN PEN NEEDLES) 31G X 5 MM MISC 1 application by Does not apply route daily. 100 each 0    Allergies  Allergen Reactions  . Dairy Aid [Lactase] Anaphylaxis and Rash  . Metformin And Related     Nausea, diarrhea, malaise  . Citric Acid Rash    Social History   Tobacco Use  . Smoking status: Former Smoker    Quit date: 2004    Years since quitting: 18.2  . Smokeless tobacco: Never Used  Substance Use Topics  . Alcohol use: Not Currently    Alcohol/week: 3.0 standard drinks    Types: 1 Glasses of wine, 1 Cans of beer, 1 Shots of liquor per week    Family History  Problem Relation Age of Onset  . Alcohol abuse Mother   . Arthritis Mother   . Diabetes Mother   . Hypertension Mother   . Hyperlipidemia Mother   . Colon polyps Mother   . Other Mother        blood clotting disorder  . Alcohol abuse Father   . Arthritis Father   . Asthma Father   . Heart disease Father   . Hypertension Father   . Hyperlipidemia Father   . Alcohol abuse Brother   . Arthritis Brother   . Drug abuse Brother      Review of Systems  Musculoskeletal: Positive for gait problem.  All other systems reviewed and are negative.   Objective:  Physical Exam Vitals reviewed.  Constitutional:      Appearance: Normal appearance.  HENT:     Head: Normocephalic and atraumatic.  Eyes:     Extraocular Movements: Extraocular movements intact.     Pupils: Pupils are equal, round, and reactive to light.  Cardiovascular:     Rate and Rhythm: Normal rate and regular rhythm.      Pulses: Normal pulses.  Pulmonary:     Effort: Pulmonary effort is normal.     Breath sounds: Normal breath sounds.  Abdominal:     Palpations: Abdomen is soft.  Musculoskeletal:     Cervical back: Normal range of motion and neck supple.     Right hip: Tenderness and bony tenderness present. Decreased range of motion. Decreased strength.  Neurological:     Mental Status: She is alert and oriented to person, place, and time.  Psychiatric:        Behavior: Behavior normal.     Vital  signs in last 24 hours:    Labs:   Estimated body mass index is 37.8 kg/m as calculated from the following:   Height as of 01/27/21: 5\' 3"  (1.6 m).   Weight as of 01/27/21: 96.8 kg.   Imaging Review Plain radiographs demonstrate severe degenerative joint disease of the right hip(s). The bone quality appears to be good for age and reported activity level.      Assessment/Plan:  End stage arthritis, right hip(s)  The patient history, physical examination, clinical judgement of the provider and imaging studies are consistent with end stage degenerative joint disease of the right hip(s) and total hip arthroplasty is deemed medically necessary. The treatment options including medical management, injection therapy, arthroscopy and arthroplasty were discussed at length. The risks and benefits of total hip arthroplasty were presented and reviewed. The risks due to aseptic loosening, infection, stiffness, dislocation/subluxation,  thromboembolic complications and other imponderables were discussed.  The patient acknowledged the explanation, agreed to proceed with the plan and consent was signed. Patient is being admitted for inpatient treatment for surgery, pain control, PT, OT, prophylactic antibiotics, VTE prophylaxis, progressive ambulation and ADL's and discharge planning.The patient is planning to be discharged home with home health services

## 2021-02-03 ENCOUNTER — Ambulatory Visit (HOSPITAL_COMMUNITY): Payer: Medicare PPO | Admitting: Emergency Medicine

## 2021-02-03 ENCOUNTER — Ambulatory Visit (HOSPITAL_COMMUNITY): Payer: Medicare PPO | Admitting: Anesthesiology

## 2021-02-03 ENCOUNTER — Encounter (HOSPITAL_COMMUNITY)
Admission: RE | Disposition: A | Discharge status: Home / Self Care | Payer: Self-pay | Source: Home / Self Care | Attending: Orthopaedic Surgery

## 2021-02-03 ENCOUNTER — Encounter (HOSPITAL_COMMUNITY): Payer: Self-pay | Admitting: Orthopaedic Surgery

## 2021-02-03 ENCOUNTER — Other Ambulatory Visit: Payer: Self-pay

## 2021-02-03 ENCOUNTER — Observation Stay (HOSPITAL_COMMUNITY): Payer: Medicare PPO

## 2021-02-03 ENCOUNTER — Observation Stay (HOSPITAL_COMMUNITY)
Admission: RE | Admit: 2021-02-03 | Discharge: 2021-02-04 | Disposition: A | Discharge status: Home / Self Care | Payer: Medicare PPO | Attending: Orthopaedic Surgery | Admitting: Orthopaedic Surgery

## 2021-02-03 ENCOUNTER — Ambulatory Visit (HOSPITAL_COMMUNITY): Payer: Medicare PPO

## 2021-02-03 DIAGNOSIS — Z87891 Personal history of nicotine dependence: Secondary | ICD-10-CM | POA: Diagnosis not present

## 2021-02-03 DIAGNOSIS — E039 Hypothyroidism, unspecified: Secondary | ICD-10-CM | POA: Insufficient documentation

## 2021-02-03 DIAGNOSIS — M25551 Pain in right hip: Secondary | ICD-10-CM | POA: Diagnosis present

## 2021-02-03 DIAGNOSIS — M1611 Unilateral primary osteoarthritis, right hip: Secondary | ICD-10-CM | POA: Diagnosis not present

## 2021-02-03 DIAGNOSIS — Z79899 Other long term (current) drug therapy: Secondary | ICD-10-CM | POA: Diagnosis not present

## 2021-02-03 DIAGNOSIS — E119 Type 2 diabetes mellitus without complications: Secondary | ICD-10-CM | POA: Diagnosis not present

## 2021-02-03 DIAGNOSIS — Z794 Long term (current) use of insulin: Secondary | ICD-10-CM | POA: Insufficient documentation

## 2021-02-03 DIAGNOSIS — Z7982 Long term (current) use of aspirin: Secondary | ICD-10-CM | POA: Insufficient documentation

## 2021-02-03 DIAGNOSIS — I10 Essential (primary) hypertension: Secondary | ICD-10-CM | POA: Diagnosis not present

## 2021-02-03 DIAGNOSIS — S72041A Displaced fracture of base of neck of right femur, initial encounter for closed fracture: Secondary | ICD-10-CM

## 2021-02-03 DIAGNOSIS — Z96641 Presence of right artificial hip joint: Secondary | ICD-10-CM

## 2021-02-03 HISTORY — PX: TOTAL HIP ARTHROPLASTY: SHX124

## 2021-02-03 HISTORY — DX: Secondary polycythemia: D75.1

## 2021-02-03 LAB — GLUCOSE, CAPILLARY
Glucose-Capillary: 125 mg/dL — ABNORMAL HIGH (ref 70–99)
Glucose-Capillary: 111 mg/dL — ABNORMAL HIGH (ref 70–99)
Glucose-Capillary: 111 mg/dL — ABNORMAL HIGH (ref 70–99)
Glucose-Capillary: 157 mg/dL — ABNORMAL HIGH (ref 70–99)
Glucose-Capillary: 121 mg/dL — ABNORMAL HIGH (ref 70–99)

## 2021-02-03 LAB — TYPE AND SCREEN
ABO/RH(D): O POS
Antibody Screen: NEGATIVE

## 2021-02-03 LAB — ABO/RH: ABO/RH(D): O POS

## 2021-02-03 IMAGING — RF DG C-ARM 1-60 MIN-NO REPORT
1 series · 6 of 6 positions shown · non-contrast
Comparison: [DATE] pelvis and right hip radiographs

FLUOROSCOPY TIME:  0 minutes 25 seconds; 3.27 mGy; 5 acquired images

CLINICAL DATA: Total hip replacement

EXAM:
OPERATIVE RIGHT HIP  2 VIEWS
TECHNIQUE: Fluoroscopic spot image(s) were submitted for interpretation
post-operatively.

[Series 1: run · 6 of 6 slices shown]
[im 1/6]
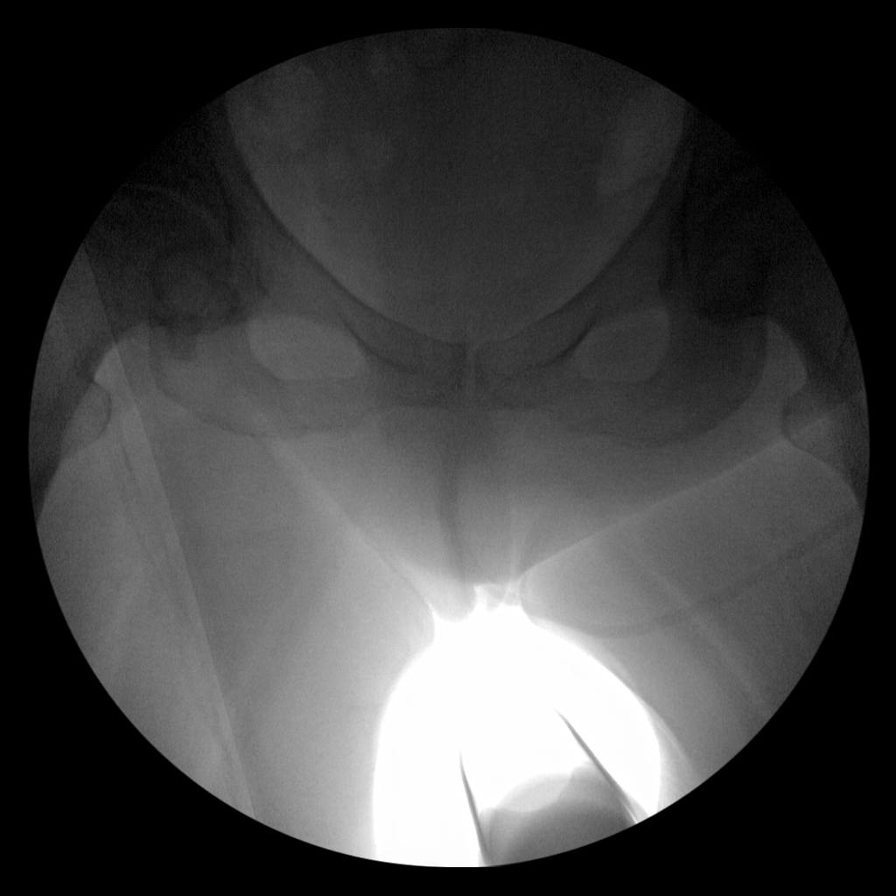
[im 2/6]
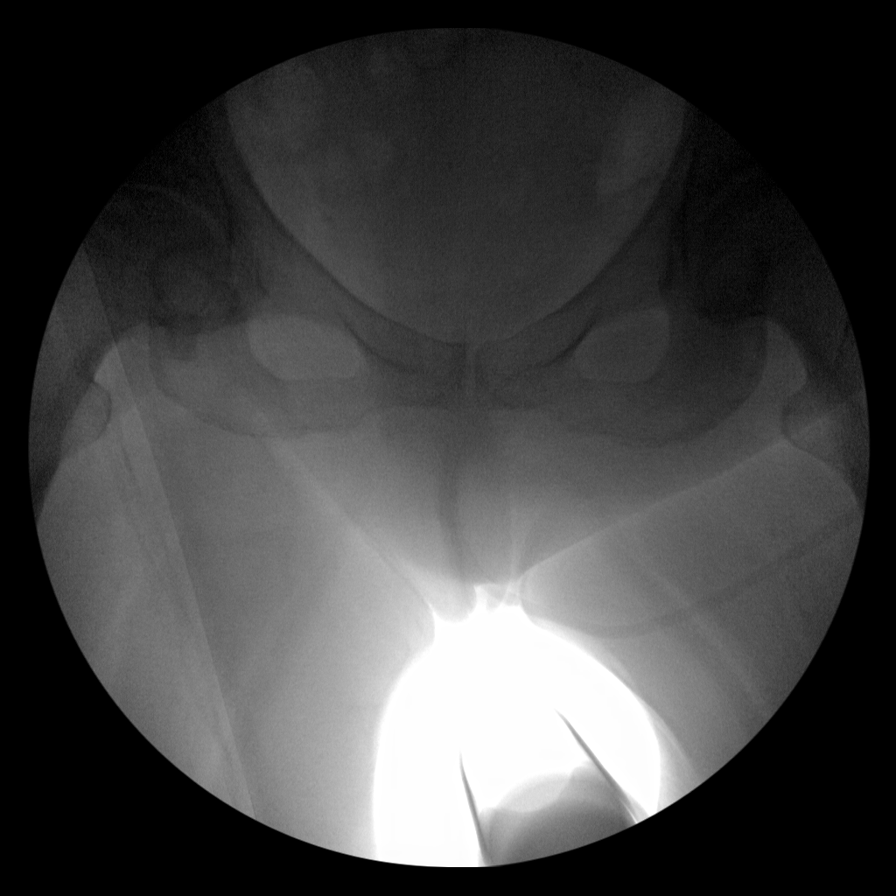
[im 3/6]
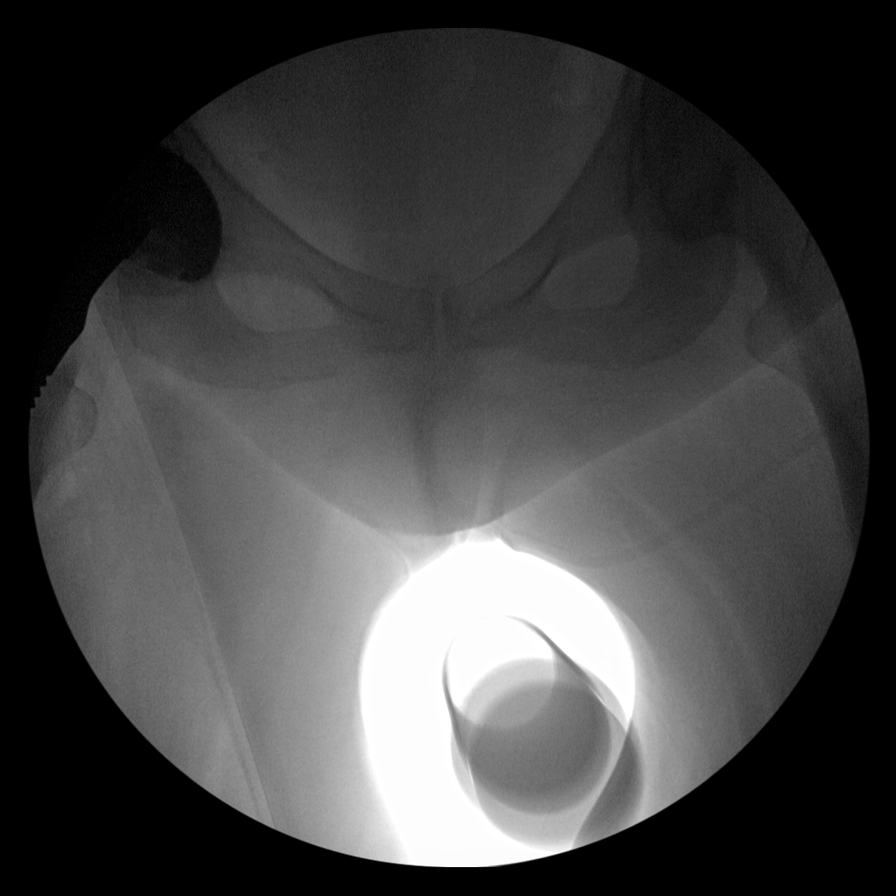
[im 4/6]
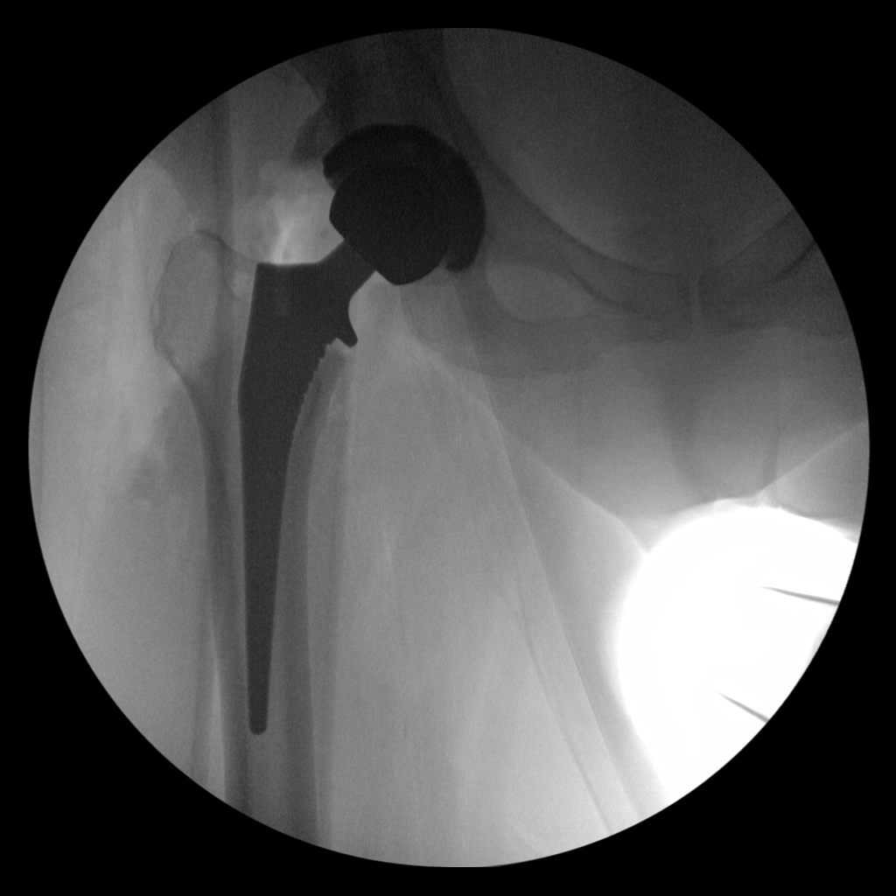
[im 5/6]
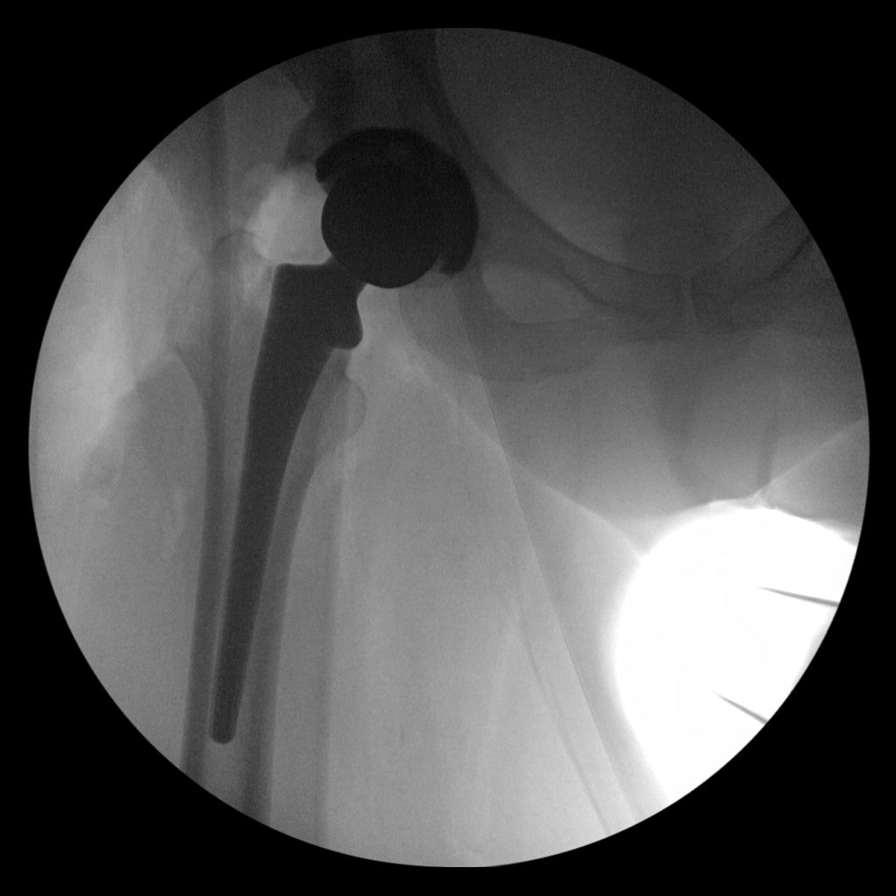
[im 6/6]
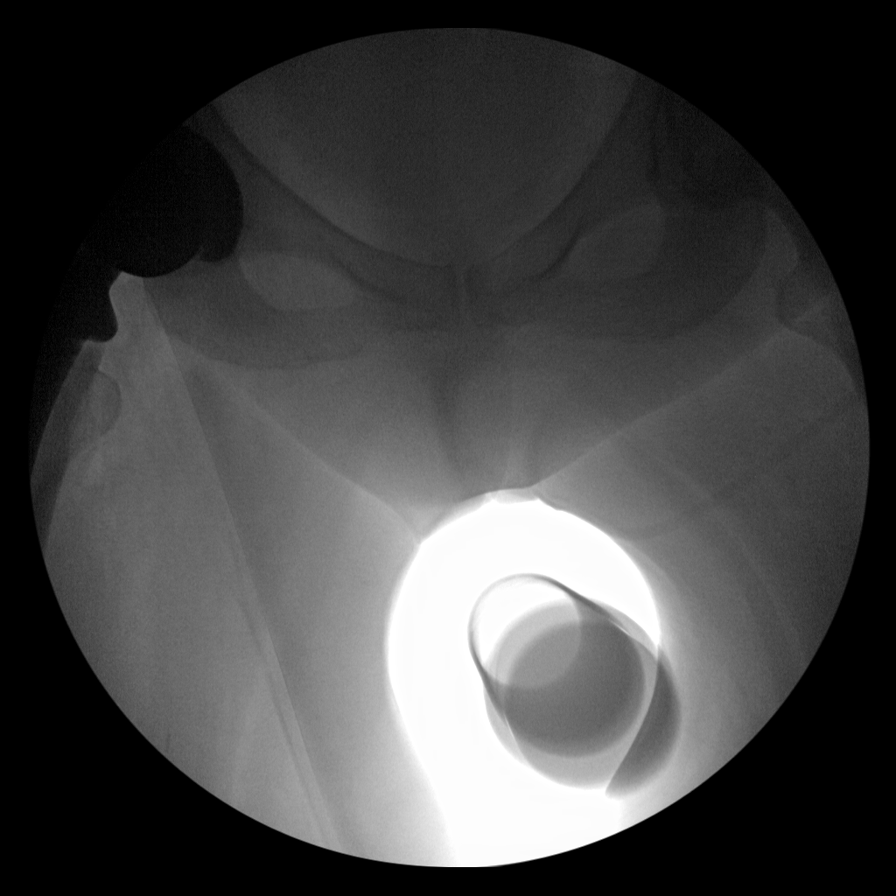

[6 of 6 positions shown; findings below may reference images not displayed]

FINDINGS: Frontal and lateral views show evidence of total hip replacement
right with prosthetic components well-seated. No fracture or
dislocation. No erosion.
IMPRESSION: Total hip replacement right with prosthetic components well-seated.
No fracture or dislocation evident.

## 2021-02-03 IMAGING — DX DG PORTABLE PELVIS
1 series · 1 of 1 positions shown · non-contrast
Comparison: Intraoperative right hip images [DATE]

CLINICAL DATA: Status post total hip arthroplasty

EXAM:
PORTABLE PELVIS 1-2 VIEWS

[pelvis ap]
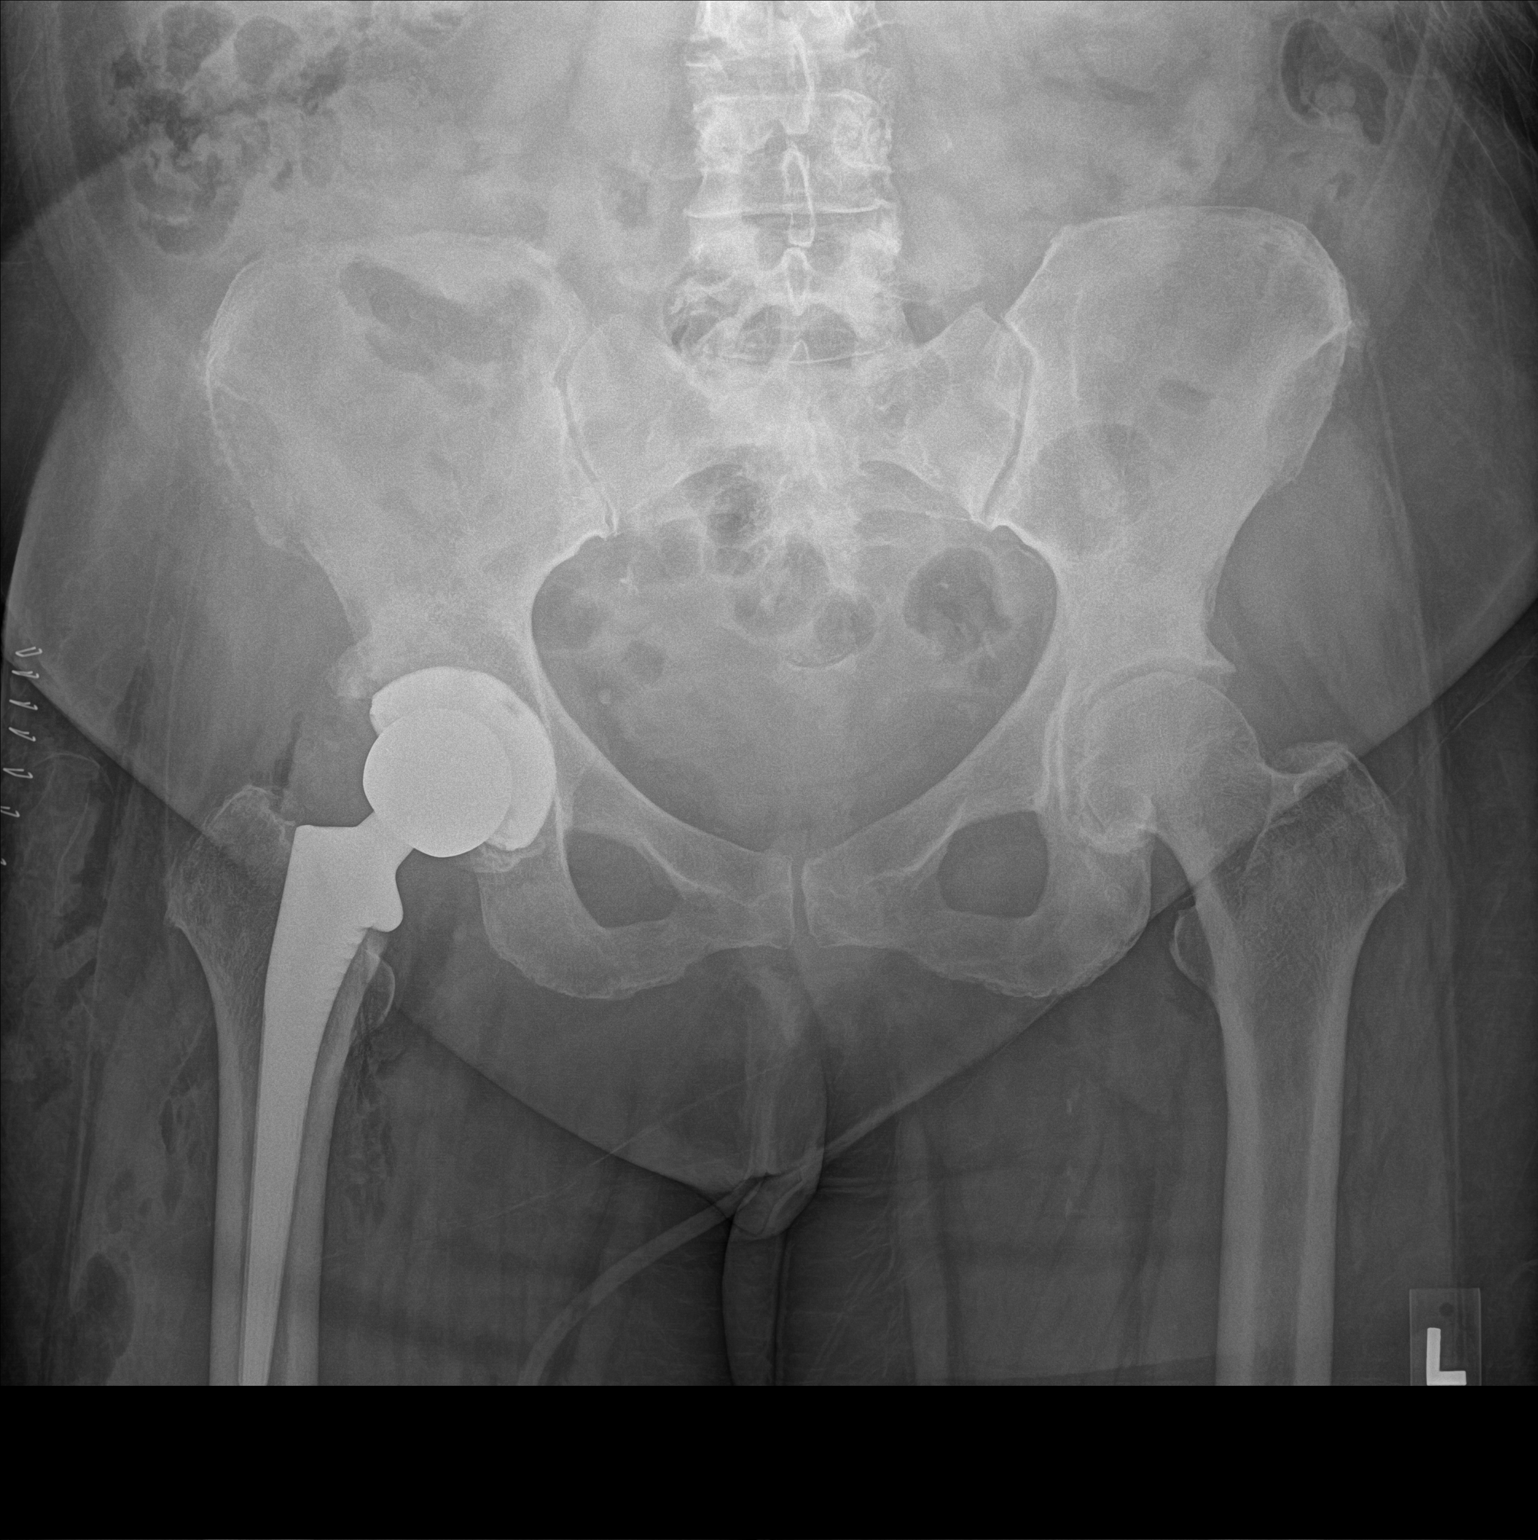

[1 of 1 positions shown; findings below may reference images not displayed]

FINDINGS: Frontal pelvis obtained. There is a total hip replacement right with
prosthetic components well-seated on frontal view. No fracture or
dislocation. There is moderate narrowing of the left hip joint. No
erosive change. Soft tissue air noted on the right.
IMPRESSION: Total hip replacement right with prosthetic components well-seated
on frontal view. Acute postoperative change on the right. Moderate
narrowing left hip joint. No fracture or dislocation.

Comment: Bony overgrowth along each superolateral acetabulum
potentially places patient at increased risk for femoroacetabular
impingement.

## 2021-02-03 IMAGING — RF DG HIP (WITH PELVIS) OPERATIVE*R*
1 series · 6 of 6 positions shown · non-contrast
Comparison: [DATE] pelvis and right hip radiographs

FLUOROSCOPY TIME:  0 minutes 25 seconds; 3.27 mGy; 5 acquired images

CLINICAL DATA: Total hip replacement

EXAM:
OPERATIVE RIGHT HIP  2 VIEWS
TECHNIQUE: Fluoroscopic spot image(s) were submitted for interpretation
post-operatively.

[Series 1: run · 6 of 6 slices shown]
[im 1/6]
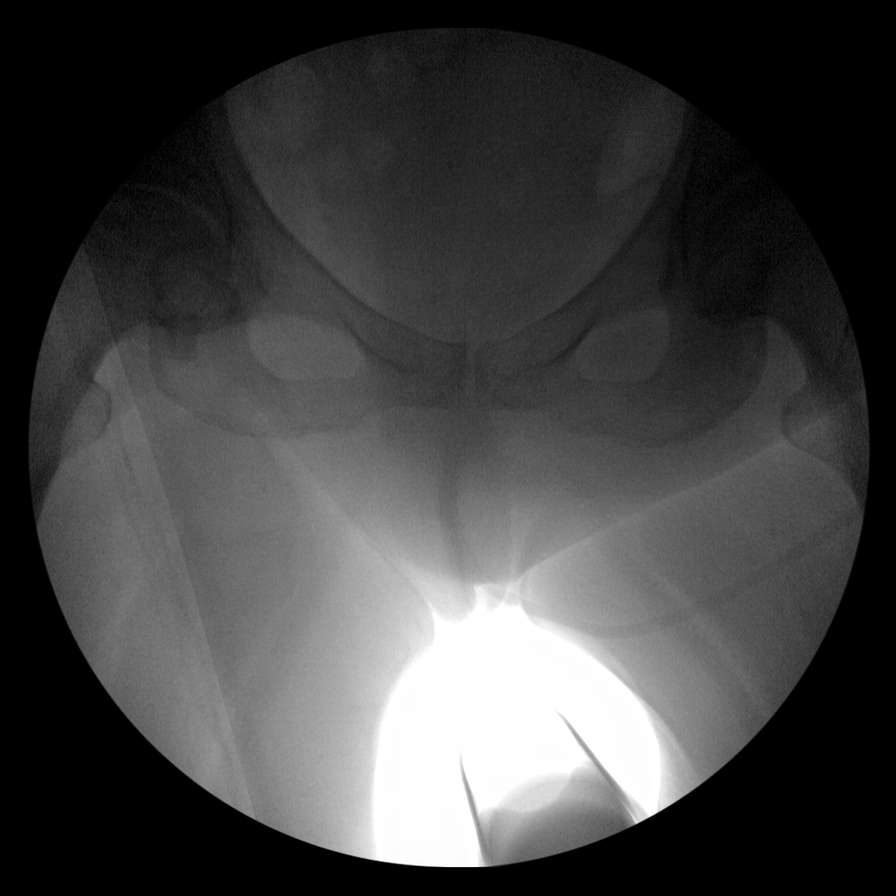
[im 2/6]
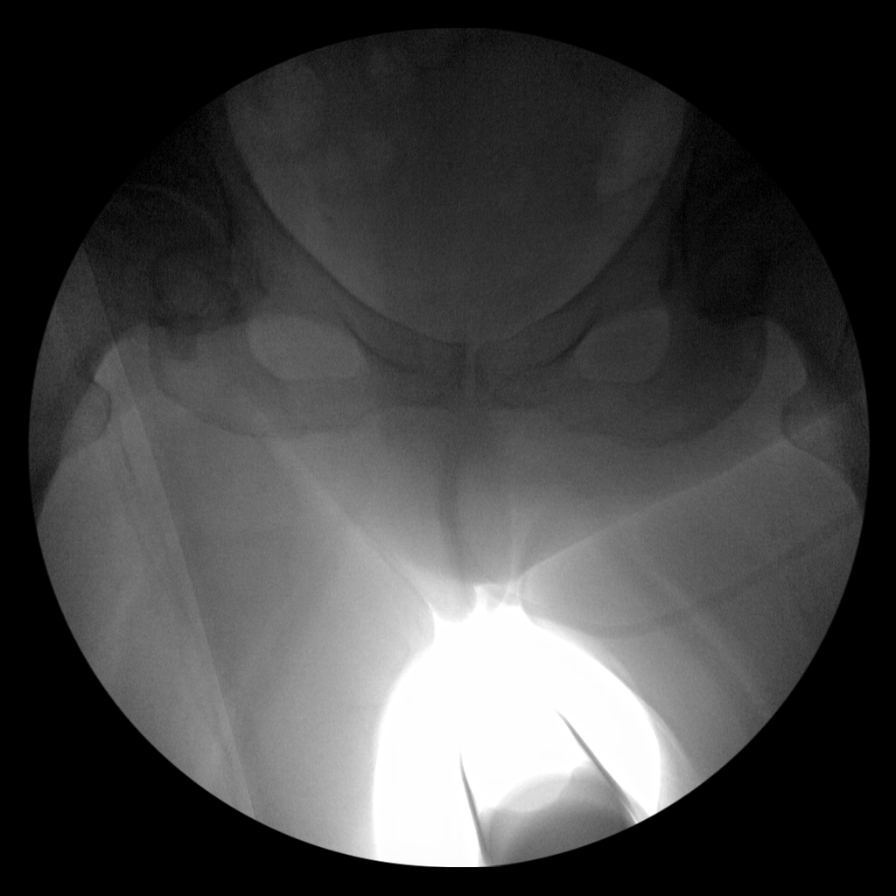
[im 3/6]
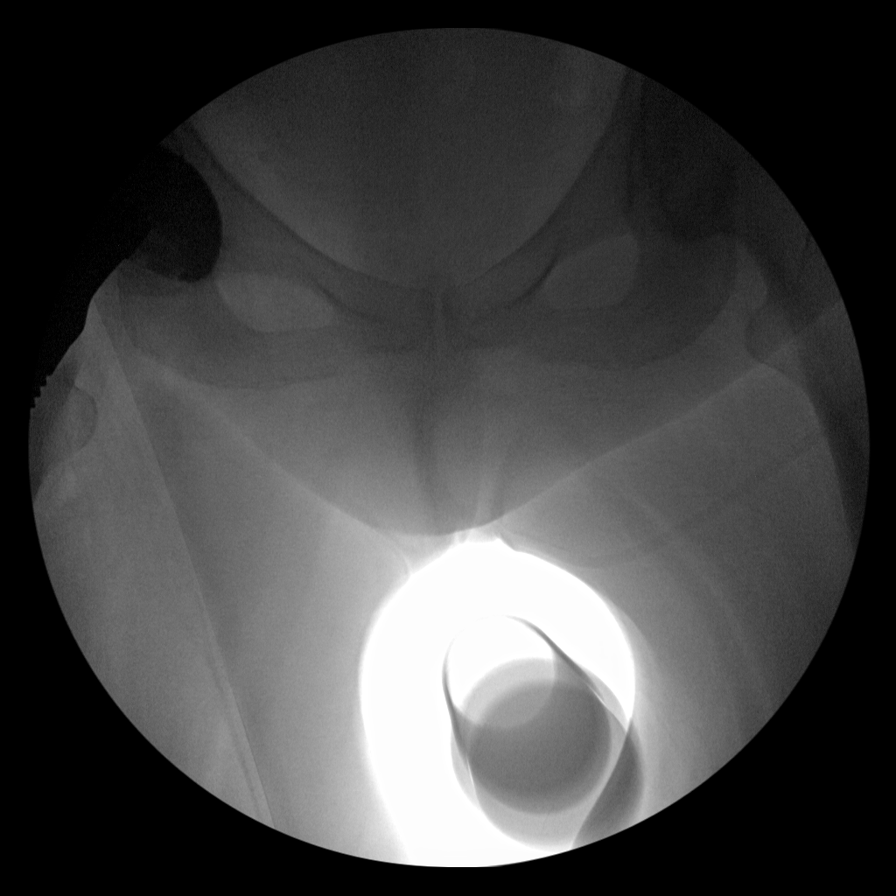
[im 4/6]
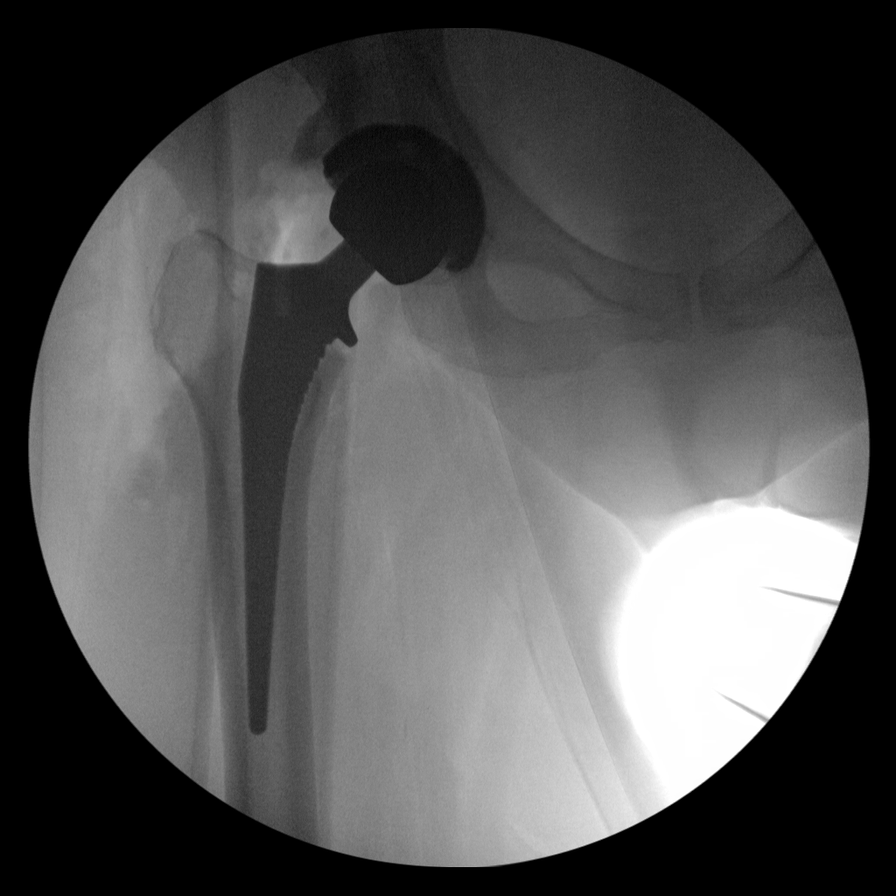
[im 5/6]
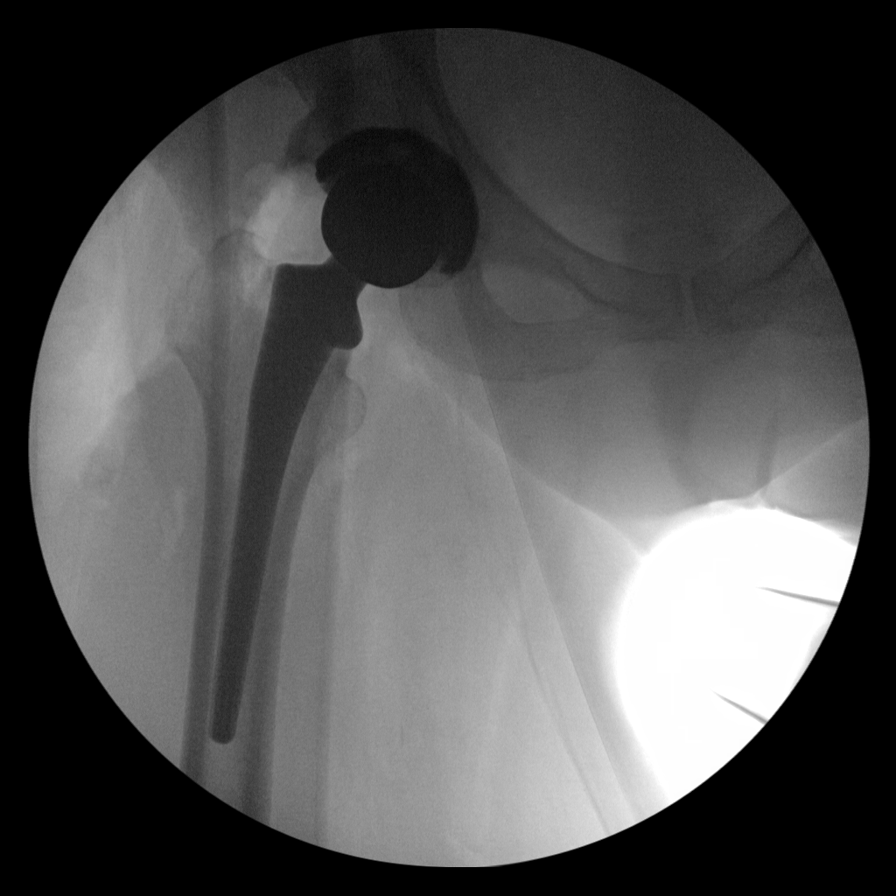
[im 6/6]
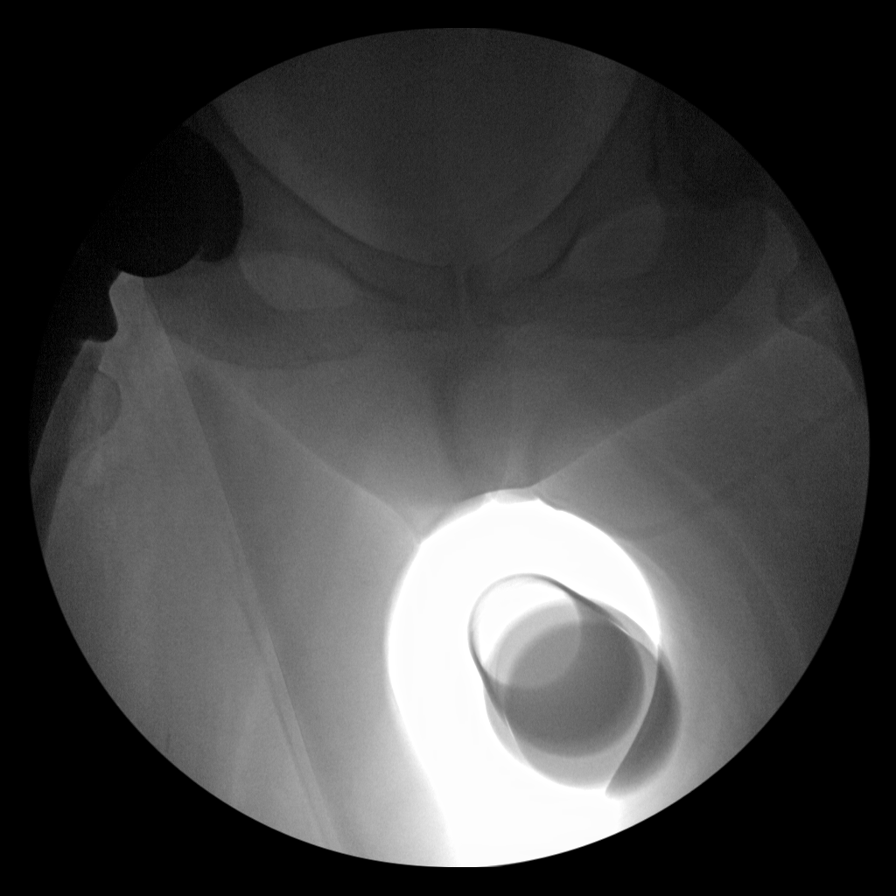

[6 of 6 positions shown; findings below may reference images not displayed]

FINDINGS: Frontal and lateral views show evidence of total hip replacement
right with prosthetic components well-seated. No fracture or
dislocation. No erosion.
IMPRESSION: Total hip replacement right with prosthetic components well-seated.
No fracture or dislocation evident.

## 2021-02-03 SURGERY — ARTHROPLASTY, HIP, TOTAL, ANTERIOR APPROACH
Anesthesia: Monitor Anesthesia Care | Site: Hip | Laterality: Right

## 2021-02-03 MED ORDER — ONDANSETRON HCL 4 MG PO TABS: 4.0000 mg | ORAL_TABLET | Freq: Four times a day (QID) | ORAL | Status: DC | PRN

## 2021-02-03 MED ORDER — STERILE WATER FOR IRRIGATION IR SOLN
Status: DC | PRN
  Administered 2021-02-03: 2000 mL

## 2021-02-03 MED ORDER — LIDOCAINE 2% (20 MG/ML) 5 ML SYRINGE
INTRAMUSCULAR | Status: AC
  Filled 2021-02-03: qty 5

## 2021-02-03 MED ORDER — DEXAMETHASONE SODIUM PHOSPHATE 10 MG/ML IJ SOLN
INTRAMUSCULAR | Status: AC
  Filled 2021-02-03: qty 1

## 2021-02-03 MED ORDER — INSULIN GLARGINE 100 UNIT/ML ~~LOC~~ SOLN
21.0000 [IU] | Freq: Every day | SUBCUTANEOUS | Status: DC
  Administered 2021-02-04: 21 [IU] via SUBCUTANEOUS
  Filled 2021-02-03 (×2): qty 0.21

## 2021-02-03 MED ORDER — LIDOCAINE 2% (20 MG/ML) 5 ML SYRINGE
INTRAMUSCULAR | Status: DC | PRN
  Administered 2021-02-03: 20 mg via INTRAVENOUS

## 2021-02-03 MED ORDER — ASPIRIN 81 MG PO CHEW
81.0000 mg | CHEWABLE_TABLET | Freq: Two times a day (BID) | ORAL | Status: DC
  Administered 2021-02-03 – 2021-02-04 (×2): 81 mg via ORAL
  Filled 2021-02-03 (×2): qty 1

## 2021-02-03 MED ORDER — EMPAGLIFLOZIN 25 MG PO TABS
25.0000 mg | ORAL_TABLET | Freq: Every day | ORAL | Status: DC
  Administered 2021-02-03 – 2021-02-04 (×2): 25 mg via ORAL
  Filled 2021-02-03 (×2): qty 1

## 2021-02-03 MED ORDER — FENTANYL CITRATE (PF) 100 MCG/2ML IJ SOLN
INTRAMUSCULAR | Status: DC | PRN
  Administered 2021-02-03: 50 ug via INTRAVENOUS
  Administered 2021-02-03 (×2): 25 ug via INTRAVENOUS

## 2021-02-03 MED ORDER — ONDANSETRON HCL 4 MG/2ML IJ SOLN
INTRAMUSCULAR | Status: DC | PRN
  Administered 2021-02-03: 4 mg via INTRAVENOUS

## 2021-02-03 MED ORDER — OXYCODONE HCL 5 MG PO TABS
10.0000 mg | ORAL_TABLET | ORAL | Status: DC | PRN
Start: 2021-02-03 — End: 2021-02-04

## 2021-02-03 MED ORDER — POVIDONE-IODINE 10 % EX SWAB
2.0000 "application " | Freq: Once | CUTANEOUS | Status: AC
  Administered 2021-02-03: 2 via TOPICAL

## 2021-02-03 MED ORDER — ONDANSETRON HCL 4 MG/2ML IJ SOLN: 4.0000 mg | Freq: Four times a day (QID) | INTRAMUSCULAR | Status: DC | PRN

## 2021-02-03 MED ORDER — METOCLOPRAMIDE HCL 5 MG/ML IJ SOLN: 5.0000 mg | Freq: Three times a day (TID) | INTRAMUSCULAR | Status: DC | PRN

## 2021-02-03 MED ORDER — INSULIN ASPART 100 UNIT/ML ~~LOC~~ SOLN: 0.0000 [IU] | Freq: Every day | SUBCUTANEOUS | Status: DC

## 2021-02-03 MED ORDER — DEXAMETHASONE SODIUM PHOSPHATE 10 MG/ML IJ SOLN
INTRAMUSCULAR | Status: DC | PRN
  Administered 2021-02-03: 4 mg via INTRAVENOUS

## 2021-02-03 MED ORDER — MIDAZOLAM HCL 2 MG/2ML IJ SOLN
INTRAMUSCULAR | Status: AC
  Filled 2021-02-03: qty 2

## 2021-02-03 MED ORDER — PHENOL 1.4 % MT LIQD: 1.0000 | OROMUCOSAL | Status: DC | PRN

## 2021-02-03 MED ORDER — CHLORHEXIDINE GLUCONATE 0.12 % MT SOLN
15.0000 mL | Freq: Once | OROMUCOSAL | Status: AC
  Administered 2021-02-03: 15 mL via OROMUCOSAL

## 2021-02-03 MED ORDER — SODIUM CHLORIDE 0.9 % IR SOLN
Status: DC | PRN
  Administered 2021-02-03: 1000 mL

## 2021-02-03 MED ORDER — HYDROMORPHONE HCL 1 MG/ML IJ SOLN
0.5000 mg | INTRAMUSCULAR | Status: DC | PRN
  Administered 2021-02-03 – 2021-02-04 (×2): 1 mg via INTRAVENOUS
  Filled 2021-02-03 (×2): qty 1

## 2021-02-03 MED ORDER — ORAL CARE MOUTH RINSE: 15.0000 mL | Freq: Once | OROMUCOSAL | Status: AC

## 2021-02-03 MED ORDER — ATORVASTATIN CALCIUM 40 MG PO TABS
80.0000 mg | ORAL_TABLET | Freq: Every day | ORAL | Status: DC
Start: 2021-02-03 — End: 2021-02-04
  Administered 2021-02-04: 80 mg via ORAL
  Filled 2021-02-03 (×2): qty 2

## 2021-02-03 MED ORDER — CEFAZOLIN SODIUM-DEXTROSE 2-4 GM/100ML-% IV SOLN
2.0000 g | INTRAVENOUS | Status: AC
  Administered 2021-02-03: 2 g via INTRAVENOUS
  Filled 2021-02-03: qty 100

## 2021-02-03 MED ORDER — OXYCODONE HCL 5 MG PO TABS
5.0000 mg | ORAL_TABLET | ORAL | Status: DC | PRN
  Administered 2021-02-03 – 2021-02-04 (×2): 10 mg via ORAL
  Filled 2021-02-03 (×2): qty 2

## 2021-02-03 MED ORDER — FENTANYL CITRATE (PF) 100 MCG/2ML IJ SOLN
INTRAMUSCULAR | Status: AC
  Filled 2021-02-03: qty 2

## 2021-02-03 MED ORDER — PROPOFOL 10 MG/ML IV BOLUS
INTRAVENOUS | Status: DC | PRN
  Administered 2021-02-03: 10 mg via INTRAVENOUS
  Administered 2021-02-03: 20 mg via INTRAVENOUS
  Administered 2021-02-03: 10 mg via INTRAVENOUS

## 2021-02-03 MED ORDER — PROPOFOL 10 MG/ML IV BOLUS
INTRAVENOUS | Status: AC
  Filled 2021-02-03: qty 20

## 2021-02-03 MED ORDER — VITAMIN D (ERGOCALCIFEROL) 1.25 MG (50000 UNIT) PO CAPS
50000.0000 [IU] | ORAL_CAPSULE | ORAL | Status: DC
  Administered 2021-02-03: 50000 [IU] via ORAL
  Filled 2021-02-03: qty 1

## 2021-02-03 MED ORDER — ZOLPIDEM TARTRATE 5 MG PO TABS: 5.0000 mg | ORAL_TABLET | Freq: Every evening | ORAL | Status: DC | PRN

## 2021-02-03 MED ORDER — KETOROLAC TROMETHAMINE 30 MG/ML IJ SOLN
30.0000 mg | Freq: Once | INTRAMUSCULAR | Status: AC
  Administered 2021-02-03: 30 mg via INTRAVENOUS

## 2021-02-03 MED ORDER — KETOROLAC TROMETHAMINE 30 MG/ML IJ SOLN
INTRAMUSCULAR | Status: AC
  Filled 2021-02-03: qty 1

## 2021-02-03 MED ORDER — ACETAMINOPHEN 325 MG PO TABS: 325.0000 mg | ORAL_TABLET | Freq: Four times a day (QID) | ORAL | Status: DC | PRN

## 2021-02-03 MED ORDER — MENTHOL 3 MG MT LOZG: 1.0000 | LOZENGE | OROMUCOSAL | Status: DC | PRN

## 2021-02-03 MED ORDER — PROPOFOL 1000 MG/100ML IV EMUL
INTRAVENOUS | Status: AC
  Filled 2021-02-03: qty 100

## 2021-02-03 MED ORDER — PROMETHAZINE HCL 25 MG/ML IJ SOLN: 6.2500 mg | INTRAMUSCULAR | Status: DC | PRN

## 2021-02-03 MED ORDER — PHENYLEPHRINE HCL-NACL 10-0.9 MG/250ML-% IV SOLN
INTRAVENOUS | Status: DC | PRN
  Administered 2021-02-03: 20 ug/min via INTRAVENOUS

## 2021-02-03 MED ORDER — BUPIVACAINE IN DEXTROSE 0.75-8.25 % IT SOLN
INTRATHECAL | Status: DC | PRN
  Administered 2021-02-03: 1.6 mL via INTRATHECAL

## 2021-02-03 MED ORDER — TRANEXAMIC ACID-NACL 1000-0.7 MG/100ML-% IV SOLN
1000.0000 mg | INTRAVENOUS | Status: AC
  Administered 2021-02-03: 1000 mg via INTRAVENOUS
  Filled 2021-02-03: qty 100

## 2021-02-03 MED ORDER — LACTATED RINGERS IV SOLN: INTRAVENOUS | Status: DC

## 2021-02-03 MED ORDER — DOCUSATE SODIUM 100 MG PO CAPS
100.0000 mg | ORAL_CAPSULE | Freq: Two times a day (BID) | ORAL | Status: DC
  Administered 2021-02-03 – 2021-02-04 (×3): 100 mg via ORAL
  Filled 2021-02-03 (×3): qty 1

## 2021-02-03 MED ORDER — ONDANSETRON HCL 4 MG/2ML IJ SOLN
INTRAMUSCULAR | Status: AC
  Filled 2021-02-03: qty 2

## 2021-02-03 MED ORDER — OXYCODONE HCL 5 MG/5ML PO SOLN
5.0000 mg | Freq: Once | ORAL | Status: AC | PRN
Start: 2021-02-03 — End: 2021-02-03

## 2021-02-03 MED ORDER — 0.9 % SODIUM CHLORIDE (POUR BTL) OPTIME
TOPICAL | Status: DC | PRN
  Administered 2021-02-03: 1000 mL

## 2021-02-03 MED ORDER — ACETAMINOPHEN 500 MG PO TABS
1000.0000 mg | ORAL_TABLET | Freq: Once | ORAL | Status: AC
  Administered 2021-02-03: 1000 mg via ORAL
  Filled 2021-02-03: qty 2

## 2021-02-03 MED ORDER — PANTOPRAZOLE SODIUM 40 MG PO TBEC
40.0000 mg | DELAYED_RELEASE_TABLET | Freq: Every day | ORAL | Status: DC
  Administered 2021-02-03: 40 mg via ORAL
  Filled 2021-02-03 (×2): qty 1

## 2021-02-03 MED ORDER — METHOCARBAMOL 500 MG PO TABS
500.0000 mg | ORAL_TABLET | Freq: Four times a day (QID) | ORAL | Status: DC | PRN
  Administered 2021-02-04: 500 mg via ORAL
  Filled 2021-02-03 (×2): qty 1

## 2021-02-03 MED ORDER — METOCLOPRAMIDE HCL 5 MG PO TABS: 5.0000 mg | ORAL_TABLET | Freq: Three times a day (TID) | ORAL | Status: DC | PRN

## 2021-02-03 MED ORDER — CEFAZOLIN SODIUM-DEXTROSE 1-4 GM/50ML-% IV SOLN
1.0000 g | Freq: Four times a day (QID) | INTRAVENOUS | Status: AC
  Administered 2021-02-03 (×2): 1 g via INTRAVENOUS
  Filled 2021-02-03 (×2): qty 50

## 2021-02-03 MED ORDER — MIDAZOLAM HCL 2 MG/2ML IJ SOLN
INTRAMUSCULAR | Status: DC | PRN
  Administered 2021-02-03: 2 mg via INTRAVENOUS

## 2021-02-03 MED ORDER — ALUM & MAG HYDROXIDE-SIMETH 200-200-20 MG/5ML PO SUSP: 30.0000 mL | ORAL | Status: DC | PRN

## 2021-02-03 MED ORDER — INSULIN ASPART 100 UNIT/ML ~~LOC~~ SOLN
0.0000 [IU] | Freq: Three times a day (TID) | SUBCUTANEOUS | Status: DC
  Administered 2021-02-03: 3 [IU] via SUBCUTANEOUS

## 2021-02-03 MED ORDER — HYDROMORPHONE HCL 1 MG/ML IJ SOLN: 0.2500 mg | INTRAMUSCULAR | Status: DC | PRN

## 2021-02-03 MED ORDER — SODIUM CHLORIDE 0.9 % IV SOLN: INTRAVENOUS | Status: DC

## 2021-02-03 MED ORDER — PHENYLEPHRINE HCL (PRESSORS) 10 MG/ML IV SOLN
INTRAVENOUS | Status: AC
  Filled 2021-02-03: qty 1

## 2021-02-03 MED ORDER — OXYCODONE HCL 5 MG PO TABS
5.0000 mg | ORAL_TABLET | Freq: Once | ORAL | Status: AC | PRN
  Administered 2021-02-03: 5 mg via ORAL

## 2021-02-03 MED ORDER — METHOCARBAMOL 500 MG IVPB - SIMPLE MED
500.0000 mg | Freq: Four times a day (QID) | INTRAVENOUS | Status: DC | PRN
  Filled 2021-02-03: qty 50

## 2021-02-03 MED ORDER — OXYCODONE HCL 5 MG PO TABS
ORAL_TABLET | ORAL | Status: AC
  Filled 2021-02-03: qty 1

## 2021-02-03 MED ORDER — POLYETHYLENE GLYCOL 3350 17 G PO PACK: 17.0000 g | PACK | Freq: Every day | ORAL | Status: DC | PRN

## 2021-02-03 MED ORDER — PROPOFOL 500 MG/50ML IV EMUL
INTRAVENOUS | Status: DC | PRN
  Administered 2021-02-03: 40 ug/kg/min via INTRAVENOUS

## 2021-02-03 SURGICAL SUPPLY — 38 items
BAG ZIPLOCK 12X15 (MISCELLANEOUS) IMPLANT
BENZOIN TINCTURE PRP APPL 2/3 (GAUZE/BANDAGES/DRESSINGS) IMPLANT
BLADE SAW SGTL 18X1.27X75 (BLADE) ×2 IMPLANT
COVER PERINEAL POST (MISCELLANEOUS) ×2 IMPLANT
COVER SURGICAL LIGHT HANDLE (MISCELLANEOUS) ×2 IMPLANT
COVER WAND RF STERILE (DRAPES) ×2 IMPLANT
DRAPE STERI IOBAN 125X83 (DRAPES) ×2 IMPLANT
DRAPE U-SHAPE 47X51 STRL (DRAPES) ×4 IMPLANT
DRSG AQUACEL AG ADV 3.5X10 (GAUZE/BANDAGES/DRESSINGS) ×2 IMPLANT
DURAPREP 26ML APPLICATOR (WOUND CARE) ×2 IMPLANT
ELECT REM PT RETURN 15FT ADLT (MISCELLANEOUS) ×2 IMPLANT
GAUZE XEROFORM 1X8 LF (GAUZE/BANDAGES/DRESSINGS) ×2 IMPLANT
GLOVE SRG 8 PF TXTR STRL LF DI (GLOVE) ×2 IMPLANT
GLOVE SURG ENC MOIS LTX SZ7.5 (GLOVE) ×2 IMPLANT
GLOVE SURG LTX SZ8 (GLOVE) ×2 IMPLANT
GLOVE SURG UNDER POLY LF SZ8 (GLOVE) ×2
GOWN STRL REUS W/TWL XL LVL3 (GOWN DISPOSABLE) ×4 IMPLANT
HANDPIECE INTERPULSE COAX TIP (DISPOSABLE) ×1
HEAD M SROM 36MM PLUS 1.5 (Hips) ×1 IMPLANT
HOLDER FOLEY CATH W/STRAP (MISCELLANEOUS) ×2 IMPLANT
KIT TURNOVER KIT A (KITS) ×2 IMPLANT
LINER NEUTRAL 52X36MM PLUS 4 (Liner) ×2 IMPLANT
PACK ANTERIOR HIP CUSTOM (KITS) ×2 IMPLANT
PENCIL SMOKE EVACUATOR (MISCELLANEOUS) IMPLANT
PIN SECTOR W/GRIP ACE CUP 52MM (Hips) ×2 IMPLANT
SET HNDPC FAN SPRY TIP SCT (DISPOSABLE) ×1 IMPLANT
SROM M HEAD 36MM PLUS 1.5 (Hips) ×2 IMPLANT
STAPLER VISISTAT 35W (STAPLE) ×2 IMPLANT
STEM CORAIL KA10 (Stem) ×2 IMPLANT
STRIP CLOSURE SKIN 1/2X4 (GAUZE/BANDAGES/DRESSINGS) IMPLANT
SUT ETHIBOND NAB CT1 #1 30IN (SUTURE) ×2 IMPLANT
SUT ETHILON 2 0 PS N (SUTURE) IMPLANT
SUT MNCRL AB 4-0 PS2 18 (SUTURE) IMPLANT
SUT VIC AB 0 CT1 36 (SUTURE) ×2 IMPLANT
SUT VIC AB 1 CT1 36 (SUTURE) ×2 IMPLANT
SUT VIC AB 2-0 CT1 27 (SUTURE) ×2
SUT VIC AB 2-0 CT1 TAPERPNT 27 (SUTURE) ×2 IMPLANT
TRAY FOLEY MTR SLVR 16FR STAT (SET/KITS/TRAYS/PACK) IMPLANT

## 2021-02-03 NOTE — Interval H&P Note (Signed)
History and Physical Interval Note: The patient understands fully that she is here today for a right total hip arthroplasty to treat the pain from her right hip osteoarthritis.  There has been no acute or interval change in her medical status.  Please see recent H&P.  The risks and benefits of surgery have been explained in detail and informed consent is obtained.  The left hip has been marked.  02/03/2021 6:59 AM  Erick Blinks Danis  has presented today for surgery, with the diagnosis of OSTEOARTHRITIS OF RIGHT HIP.  The various methods of treatment have been discussed with the patient and family. After consideration of risks, benefits and other options for treatment, the patient has consented to  Procedure(s): RIGHT TOTAL HIP ARTHROPLASTY ANTERIOR APPROACH (Right) as a surgical intervention.  The patient's history has been reviewed, patient examined, no change in status, stable for surgery.  I have reviewed the patient's chart and labs.  Questions were answered to the patient's satisfaction.     Mcarthur Rossetti

## 2021-02-03 NOTE — Anesthesia Procedure Notes (Signed)
Procedure Name: MAC Date/Time: 02/03/2021 8:28 AM Performed by: Eben Burow, CRNA Pre-anesthesia Checklist: Patient identified, Emergency Drugs available, Suction available, Patient being monitored and Timeout performed Oxygen Delivery Method: Simple face mask Placement Confirmation: positive ETCO2

## 2021-02-03 NOTE — Anesthesia Procedure Notes (Signed)
Spinal  Patient location during procedure: OR Start time: 02/03/2021 8:28 AM End time: 02/03/2021 8:36 AM Reason for block: surgical anesthesia Staffing Performed: anesthesiologist  Anesthesiologist: Pervis Hocking, DO Preanesthetic Checklist Completed: patient identified, IV checked, risks and benefits discussed, surgical consent, monitors and equipment checked, pre-op evaluation and timeout performed Spinal Block Patient position: sitting Prep: DuraPrep and site prepped and draped Patient monitoring: cardiac monitor, continuous pulse ox and blood pressure Approach: midline Location: L3-4 Injection technique: single-shot Needle Needle type: Pencan  Needle gauge: 24 G Needle length: 9 cm Assessment Sensory level: T6 Events: CSF return Additional Notes Functioning IV was confirmed and monitors were applied. Sterile prep and drape, including hand hygiene and sterile gloves were used. The patient was positioned and the spine was prepped. The skin was anesthetized with lidocaine.  Free flow of clear CSF was obtained prior to injecting local anesthetic into the CSF.  The spinal needle aspirated freely following injection.  The needle was carefully withdrawn.  The patient tolerated the procedure well.   1 prior attempt by crna

## 2021-02-03 NOTE — Brief Op Note (Signed)
02/03/2021  9:49 AM  PATIENT:  [name] [name]  60 y.o. female  PRE-OPERATIVE DIAGNOSIS:  OSTEOARTHRITIS OF RIGHT HIP  POST-OPERATIVE DIAGNOSIS:  OSTEOARTHRITIS OF RIGHT HIP  PROCEDURE:  Procedure(s): RIGHT TOTAL HIP ARTHROPLASTY ANTERIOR APPROACH (Right)  SURGEON:  Surgeon(s) and Role:    * Mcarthur Rossetti, MD - Primary  PHYSICIAN ASSISTANT:  Benita Stabile, PA-C  ANESTHESIA:   spinal  EBL:  100 mL   COUNTS:  YES  DICTATION: .Other Dictation: Dictation Number [id number]  PLAN OF CARE: Admit for overnight observation  PATIENT DISPOSITION:  PACU - hemodynamically stable.   Delay start of Pharmacological VTE agent (>24hrs) due to surgical blood loss or risk of bleeding: no

## 2021-02-03 NOTE — Anesthesia Postprocedure Evaluation (Signed)
Anesthesia Post Note  Patient: Erick Blinks Rathman  Procedure(s) Performed: RIGHT TOTAL HIP ARTHROPLASTY ANTERIOR APPROACH (Right Hip)     Patient location during evaluation: PACU Anesthesia Type: MAC and Spinal Level of consciousness: awake and alert Pain management: pain level controlled Vital Signs Assessment: post-procedure vital signs reviewed and stable Respiratory status: spontaneous breathing, nonlabored ventilation and respiratory function stable Cardiovascular status: blood pressure returned to baseline and stable Postop Assessment: no apparent nausea or vomiting, no headache, spinal receding and no backache Anesthetic complications: no   No complications documented.  Last Vitals:  Vitals:   02/03/21 0540 02/03/21 1001  BP: (!) 190/90 136/78  Pulse: 73 63  Resp: 16 16  Temp: 36.9 C (!) 36.4 C  SpO2: 94% 98%    Last Pain:  Vitals:   02/03/21 1001  TempSrc:   PainSc: 0-No pain                 Pervis Hocking

## 2021-02-03 NOTE — Op Note (Signed)
[name] [name], [name] [name] MEDICAL RECORD NO: [id number] ACCOUNT NO: [id number] DATE OF BIRTH: [birth year] FACILITY: [facility]ATION: [id number] PHYSICIAN: Vanita Panda. Magnus Ivan, MD  Operative Report   DATE OF PROCEDURE: 02/03/2021  PREOPERATIVE DIAGNOSIS:  Primary osteoarthritis and degenerative joint disease, right hip.  POSTOPERATIVE DIAGNOSIS:  Primary osteoarthritis and degenerative joint disease, right hip.  PROCEDURE:  Right total hip arthroplasty, direct anterior approach.  IMPLANTS:  DePuy Sector Gription acetabular component size 52, size 36+4 neutral polyethylene liner, size 10 Corail femoral component with standard offset, size 36+1.5 metal hip ball.  SURGEON:  Vanita Panda. Magnus Ivan, MD  ASSISTANT:  Rexene Edison, PA-C  ANESTHESIA:  Spinal.  ANTIBIOTICS:  2 g IV Ancef.  ESTIMATED BLOOD LOSS:  100 mL.  COMPLICATIONS:  None.  INDICATIONS:  The patient is a 60 year old diabetic female with debilitating end-stage arthritis involving her right hip.  Her hip pain is daily and is detrimentally affecting her mobility, her quality of life, and activities of daily living.  She has  much better blood glucose control than she used to have with a hemoglobin A1c of now just over 6.  At this point, her hip pain is so debilitating she does wish to proceed with a total hip arthroplasty.  Her x-rays show complete obliteration of the joint  space.  I agreed to proceed with the surgery.  We talked in detail about the risk of acute blood loss anemia, nerve or vessel injury, fracture, infection, dislocation, DVT, and implant failure.  We talked about her goals being decreased pain, improved  mobility, and overall improved quality of life.  DESCRIPTION OF PROCEDURE:  After informed consent was obtained, appropriate right hip was marked.  She was brought to the operating room and sat up on a stretcher.  Spinal anesthesia was obtained.  She was laid in supine position on the stretcher.  Foley  catheter was  placed.  I assessed her leg length and even though her x-rays standing in the office showed that leg length wise she is significantly shorter on the right than the left, so we will definitely compensate for that during surgery.  Traction  boots were placed on both her feet.  Next, she was placed supine on the Hana fracture table, the perineal post in place, and both legs in line skeletal traction device and no traction applied.  Her right operative hip was prepped and draped with DuraPrep  and sterile drapes.  A timeout was called, and she was identified correct patient, correct right hip.  I then made an incision just inferior and posterior to the anterior iliac spine and carried this obliquely down the leg.  We dissected down tensor  fascia lata muscle.  Tensor fascia was then divided longitudinally to proceed with direct anterior approach to the hip.  We identified and cauterized circumflex vessels, then identified the hip capsule, opened up the hip capsule in L-type format finding  a moderate joint effusion and significant periarticular osteophytes around the femoral head and neck.  I then made a femoral neck cut with an oscillating saw just proximal to the lesser trochanter and completed this with an osteotome.  We placed a  corkscrew guide in the femoral head and removed the femoral head in its entirety and found a wide area devoid of cartilage.  There were significant periarticular osteophytes around the acetabulum as well that we removed.  I then removed remnants of the  acetabular labrum and other debris and placed a bent Hohmann over the medial acetabular  rim.  I then began reaming under direct visualization from a size 43 reamer in a stepwise increments going up to a size 51 with all reamers under direct  visualization.  Last reamer was placed under direct fluoroscopy, so I could obtain our depth of reaming, my inclination and anteversion.  I then placed a real DePuy Sector Gription acetabular  component size 52 and a 36+4 neutral polyethylene liner given  I felt that I had medialized her.  Attention was then turned to the femur.  With leg externally rotated 120 degrees, extended, and adducted, we were able to place a Mueller retractor medially and Hohman retractor behind the greater trochanter.  We  released lateral joint capsule and used a box cutting osteotome to enter the femoral canal and a rongeur to lateralize.  We then began broaching using the Corail broaching system from a size 8 going to a size 10.  With size 10 in place, we trialed  standard offset femoral neck and a 36+1.5 hip ball, reduced this in acetabulum, and it was incredibly tight, but I felt like this was compensating for significant leg length difference.  She measured to be longer on this right side with more offset, but  I felt like we were still needing to make her longer based on her intraoperative films before surgery were showing her leg lengths to be equal if not such touch long on the right, but clinically even though she was shorter.  We dislocated the hip,  removed the trial components.  I placed the real Corail femoral component with standard offset size 10 and the real 36+1.5 metal hip ball and again reduced this in acetabulum.  It was nice, tight, and stable on exam.  I did feel like we had lengthened  her.  We then irrigated the soft tissue with normal saline solution using pulsatile lavage.  We closed the joint capsule with interrupted #1 Ethibond suture followed by #1 Vicryl to close deep tissue, 2-0 Vicryl to close the subcutaneous tissue, and this  included #1 Vicryl in the tensor fascia.  The skin was reapproximated with staples.  An Aquacel dressing was applied.  She was taken off the Hana table and taken to recovery room in stable condition with all final counts being correct.  No complications  noted.  Of note, Rexene Edison, PA-C, assisted during the entire case.  His assistance was crucial for  facilitating all aspects of this case.   [facility] D: 02/03/2021 9:47:35 am T: 02/03/2021 11:45:00 am  JOB: [id number]/ [id number]

## 2021-02-03 NOTE — Transfer of Care (Signed)
Immediate Anesthesia Transfer of Care Note  Patient: [name] [name]  Procedure(s) Performed: RIGHT TOTAL HIP ARTHROPLASTY ANTERIOR APPROACH (Right Hip)  Patient Location: PACU  Anesthesia Type:Spinal  Level of Consciousness: awake, alert  and patient cooperative  Airway & Oxygen Therapy: Patient Spontanous Breathing and Patient connected to face mask oxygen  Post-op Assessment: Report given to RN and Post -op Vital signs reviewed and stable  Post vital signs: Reviewed and stable  Last Vitals:  Vitals Value Taken Time  BP 136/78 02/03/21 1001  Temp    Pulse 62 02/03/21 1003  Resp 17 02/03/21 1003  SpO2 100 % 02/03/21 1003  Vitals shown include unvalidated device data.  Last Pain:  Vitals:   02/03/21 0630  TempSrc:   PainSc: 6       Patients Stated Pain Goal: 2 (88/67/73 7366)  Complications: No complications documented.

## 2021-02-03 NOTE — Evaluation (Signed)
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  Pt should progress to dc home with assist of family.    Follow Up Recommendations Home health PT;Follow surgeon's recommendation for DC plan and follow-up therapies    Equipment Recommendations  Rolling walker with 5" wheels    Recommendations for Other Services       Precautions / Restrictions Precautions Precautions: Fall Restrictions Weight Bearing Restrictions: No Other Position/Activity Restrictions: WBAT      Mobility  Bed Mobility Overal bed mobility: Needs Assistance Bed Mobility: Supine to Sit     Supine to sit: Min assist     General bed mobility comments: cues for sequence and use of L LE to self assist    Transfers Overall transfer level: Needs assistance Equipment used: Rolling walker (2 wheeled) Transfers: Sit to/from Stand Sit to Stand: Min assist         General transfer comment: cues for LE management and use of UEs to self assist  Ambulation/Gait Ambulation/Gait assistance: Min assist Gait Distance (Feet): 100 Feet Assistive device: Rolling walker (2 wheeled) Gait Pattern/deviations: Step-to pattern;Step-through pattern;Decreased step length - right;Decreased step length - left;Shuffle;Trunk flexed Gait velocity: decr   General Gait Details: cues for posture, position from RW and initial sequence  Stairs            Wheelchair Mobility    Modified Rankin (Stroke Patients Only)       Balance Overall balance assessment: Needs assistance Sitting-balance support: No upper extremity supported;Feet supported Sitting balance-Leahy Scale: Fair      Standing balance support: Bilateral upper extremity supported Standing balance-Leahy Scale: Poor                               Pertinent Vitals/Pain Pain Assessment: 0-10 Pain Score: 5  Pain Location: R hip Pain Descriptors / Indicators: Aching;Sore Pain Intervention(s): Limited activity within patient's tolerance;Monitored during session;Premedicated before session;Ice applied    Home Living Family/patient expects to be discharged to:: Private residence Living Arrangements: Spouse/significant other Available Help at Discharge: Family;Available 24 hours/day Type of Home: House Home Access: Stairs to enter Entrance Stairs-Rails: Right;Left;Can reach both Entrance Stairs-Number of Steps: 5 Home Layout: One level Home Equipment: Cane - single point      Prior Function Level of Independence: Independent         Comments: very occassional use of cane outside     Hand Dominance   Dominant Hand: Right    Extremity/Trunk Assessment   Upper Extremity Assessment Upper Extremity Assessment: Overall WFL for tasks assessed    Lower Extremity Assessment Lower Extremity Assessment: RLE deficits/detail    Cervical / Trunk Assessment Cervical / Trunk Assessment: Normal  Communication   Communication: No difficulties  Cognition Arousal/Alertness: Awake/alert Behavior During Therapy: WFL for tasks assessed/performed Overall Cognitive Status: Within Functional Limits for tasks assessed                                        General Comments      Exercises Total  Joint Exercises Ankle Circles/Pumps: AROM;Both;15 reps;Supine   Assessment/Plan    PT Assessment Patient needs continued PT services  PT Problem List Decreased strength;Decreased range of motion;Decreased activity tolerance;Decreased balance;Decreased mobility;Decreased knowledge of use of DME;Pain       PT Treatment Interventions DME instruction;Gait training;Stair  training;Functional mobility training;Therapeutic activities;Therapeutic exercise;Patient/family education    PT Goals (Current goals can be found in the Care Plan section)  Acute Rehab PT Goals Patient Stated Goal: Regain IND PT Goal Formulation: With patient Time For Goal Achievement: 02/10/21 Potential to Achieve Goals: Good    Frequency 7X/week   Barriers to discharge        Co-evaluation               AM-PAC PT "6 Clicks" Mobility  Outcome Measure Help needed turning from your back to your side while in a flat bed without using bedrails?: A Little Help needed moving from lying on your back to sitting on the side of a flat bed without using bedrails?: A Little Help needed moving to and from a bed to a chair (including a wheelchair)?: A Little Help needed standing up from a chair using your arms (e.g., wheelchair or bedside chair)?: A Little Help needed to walk in hospital room?: A Little Help needed climbing 3-5 steps with a railing? : A Lot 6 Click Score: 17    End of Session   Activity Tolerance: Patient tolerated treatment well Patient left: in chair;with call bell/phone within reach Nurse Communication: Mobility status PT Visit Diagnosis: Difficulty in walking, not elsewhere classified (R26.2)    Time: 1425-1500 PT Time Calculation (min) (ACUTE ONLY): 35 min   Charges:   PT Evaluation $PT Eval Low Complexity: 1 Low PT Treatments $Gait Training: 8-22 mins        Mauro Kaufmann PT Acute Rehabilitation Services Pager [phone] Office [phone]   BRADSHAW,HUNTER 02/03/2021, 3:49 PM

## 2021-02-04 DIAGNOSIS — M1611 Unilateral primary osteoarthritis, right hip: Secondary | ICD-10-CM | POA: Diagnosis not present

## 2021-02-04 LAB — CBC
HCT: 40.7 % (ref 36.0–46.0)
Hemoglobin: 12.9 g/dL (ref 12.0–15.0)
MCH: 29.6 pg (ref 26.0–34.0)
MCHC: 31.7 g/dL (ref 30.0–36.0)
MCV: 93.3 fL (ref 80.0–100.0)
Platelets: 185 10*3/uL (ref 150–400)
RBC: 4.36 MIL/uL (ref 3.87–5.11)
RDW: 13.6 % (ref 11.5–15.5)
WBC: 11.4 10*3/uL — ABNORMAL HIGH (ref 4.0–10.5)
nRBC: 0 % (ref 0.0–0.2)

## 2021-02-04 LAB — BASIC METABOLIC PANEL
Anion gap: 12 (ref 5–15)
BUN: 19 mg/dL (ref 6–20)
CO2: 18 mmol/L — ABNORMAL LOW (ref 22–32)
Calcium: 8.9 mg/dL (ref 8.9–10.3)
Chloride: 108 mmol/L (ref 98–111)
Creatinine, Ser: 0.68 mg/dL (ref 0.44–1.00)
GFR, Estimated: >60 mL/min (ref >60)
Glucose, Bld: 108 mg/dL — ABNORMAL HIGH (ref 70–99)
Potassium: 3.9 mmol/L (ref 3.5–5.1)
Sodium: 138 mmol/L (ref 135–145)

## 2021-02-04 LAB — GLUCOSE, CAPILLARY
Glucose-Capillary: 90 mg/dL (ref 70–99)
Glucose-Capillary: 108 mg/dL — ABNORMAL HIGH (ref 70–99)

## 2021-02-04 MED ORDER — METHOCARBAMOL 500 MG PO TABS: 500.0000 mg | ORAL_TABLET | Freq: Four times a day (QID) | ORAL | 1 refills | Status: DC | PRN

## 2021-02-04 MED ORDER — ASPIRIN 81 MG PO CHEW: 81.0000 mg | CHEWABLE_TABLET | Freq: Two times a day (BID) | ORAL | 0 refills | Status: DC

## 2021-02-04 MED ORDER — OXYCODONE HCL 5 MG PO TABS: 5.0000 mg | ORAL_TABLET | Freq: Four times a day (QID) | ORAL | 0 refills | Status: DC | PRN

## 2021-02-04 NOTE — Progress Notes (Signed)
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  Pt hopeful for dc home this pm.   Follow Up Recommendations  Home health PT;Follow surgeon's recommendation for DC plan and follow-up therapies     Equipment Recommendations  Rolling walker with 5" wheels    Recommendations for Other Services       Precautions / Restrictions Precautions Precautions: Fall Restrictions Weight Bearing Restrictions: No Other Position/Activity Restrictions: WBAT    Mobility  Bed Mobility Overal bed mobility: Needs Assistance Bed Mobility: Supine to Sit;Sit to Supine     Supine to sit: Min guard Sit to supine: Supervision   General bed mobility comments: Increased time with cues for sequence and use of L LE to self assist    Transfers Overall transfer level: Needs assistance Equipment used: Rolling walker (2 wheeled) Transfers: Sit to/from Stand Sit to Stand: Min guard         General transfer comment: cues for LE management and use of UEs to self assist  Ambulation/Gait Ambulation/Gait assistance: Min guard Gait Distance (Feet): 170 Feet Assistive device: Rolling walker (2 wheeled) Gait Pattern/deviations: Step-to pattern;Step-through pattern;Decreased step length - right;Decreased step length - left;Shuffle;Trunk flexed Gait velocity: decr   General Gait Details: cues for posture, position from RW and initial sequence   Stairs             Wheelchair Mobility    Modified Rankin (Stroke Patients Only)       Balance Overall balance assessment: Needs assistance Sitting-balance support: No upper extremity supported;Feet supported Sitting balance-Leahy Scale: Fair     Standing balance support:  No upper extremity supported Standing balance-Leahy Scale: Fair                              Cognition Arousal/Alertness: Awake/alert Behavior During Therapy: WFL for tasks assessed/performed Overall Cognitive Status: Within Functional Limits for tasks assessed                                        Exercises Total Joint Exercises Ankle Circles/Pumps: AROM;Both;15 reps;Supine Quad Sets: AROM;Both;10 reps;Supine Heel Slides: AAROM;Right;20 reps;Supine Hip ABduction/ADduction: AAROM;Right;15 reps;Supine    General Comments        Pertinent Vitals/Pain Pain Assessment: 0-10 Pain Score: 5  Pain Location: R hip Pain Descriptors / Indicators: Aching;Sore Pain Intervention(s): Limited activity within patient's tolerance;Monitored during session;Premedicated before session;Ice applied    Home Living                      Prior Function            PT Goals (current goals can now be found in the care plan section) Acute Rehab PT Goals Patient Stated Goal: Regain IND PT Goal Formulation: With patient Time For Goal Achievement: 02/10/21 Potential to Achieve Goals: Good Progress towards PT goals: Progressing toward goals    Frequency    7X/week      PT Plan Current plan remains appropriate    Co-evaluation  AM-PAC PT "6 Clicks" Mobility   Outcome Measure  Help needed turning from your back to your side while in a flat bed without using bedrails?: A Little Help needed moving from lying on your back to sitting on the side of a flat bed without using bedrails?: A Little Help needed moving to and from a bed to a chair (including a wheelchair)?: A Little Help needed standing up from a chair using your arms (e.g., wheelchair or bedside chair)?: A Little Help needed to walk in hospital room?: A Little Help needed climbing 3-5 steps with a railing? : A Lot 6 Click Score: 17    End of Session Equipment Utilized During  Treatment: Gait belt Activity Tolerance: Patient tolerated treatment well Patient left: in chair;with call bell/phone within reach Nurse Communication: Mobility status PT Visit Diagnosis: Difficulty in walking, not elsewhere classified (R26.2)     Time: 8295-6213 PT Time Calculation (min) (ACUTE ONLY): 34 min  Charges:  $Gait Training: 8-22 mins $Therapeutic Exercise: 8-22 mins                     Mauro Kaufmann PT Acute Rehabilitation Services Pager [phone] Office [phone]    [name] [name] 02/04/2021, 12:41 PM

## 2021-02-04 NOTE — Plan of Care (Signed)

## 2021-02-04 NOTE — Discharge Instructions (Signed)

## 2021-02-04 NOTE — Plan of Care (Signed)
  Problem: Education: Goal: Knowledge of General Education information will improve Description: Including pain rating scale, medication(s)/side effects and non-pharmacologic comfort measures Outcome: Adequate for Discharge   Problem: Health Behavior/Discharge Planning: Goal: Ability to manage health-related needs will improve Outcome: Adequate for Discharge   Problem: Clinical Measurements: Goal: Ability to maintain clinical measurements within normal limits will improve Outcome: Adequate for Discharge Goal: Will remain free from infection Outcome: Adequate for Discharge Goal: Diagnostic test results will improve Outcome: Adequate for Discharge Goal: Respiratory complications will improve Outcome: Adequate for Discharge Goal: Cardiovascular complication will be avoided Outcome: Adequate for Discharge   Problem: Activity: Goal: Risk for activity intolerance will decrease Outcome: Adequate for Discharge   Problem: Nutrition: Goal: Adequate nutrition will be maintained Outcome: Adequate for Discharge   Problem: Coping: Goal: Level of anxiety will decrease Outcome: Adequate for Discharge   Problem: Elimination: Goal: Will not experience complications related to bowel motility Outcome: Adequate for Discharge Goal: Will not experience complications related to urinary retention Outcome: Adequate for Discharge   Problem: Pain Managment: Goal: General experience of comfort will improve Outcome: Adequate for Discharge   Problem: Safety: Goal: Ability to remain free from injury will improve Outcome: Adequate for Discharge   Problem: Skin Integrity: Goal: Risk for impaired skin integrity will decrease Outcome: Adequate for Discharge   Problem: Education: Goal: Knowledge of the prescribed therapeutic regimen will improve Outcome: Adequate for Discharge Goal: Understanding of discharge needs will improve Outcome: Adequate for Discharge Goal: Individualized Educational  Video(s) Outcome: Adequate for Discharge   Problem: Activity: Goal: Ability to avoid complications of mobility impairment will improve Outcome: Adequate for Discharge Goal: Ability to tolerate increased activity will improve Outcome: Adequate for Discharge   Problem: Clinical Measurements: Goal: Postoperative complications will be avoided or minimized Outcome: Adequate for Discharge   Problem: Pain Management: Goal: Pain level will decrease with appropriate interventions Outcome: Adequate for Discharge   Problem: Skin Integrity: Goal: Will show signs of wound healing Outcome: Adequate for Discharge   Problem: Acute Rehab PT Goals(only PT should resolve) Goal: Pt Will Go Supine/Side To Sit Outcome: Adequate for Discharge Goal: Pt Will Go Sit To Supine/Side Outcome: Adequate for Discharge Goal: Patient Will Transfer Sit To/From Stand Outcome: Adequate for Discharge Goal: Pt Will Ambulate Outcome: Adequate for Discharge Goal: Pt Will Go Up/Down Stairs Outcome: Adequate for Discharge

## 2021-02-04 NOTE — Discharge Summary (Signed)
Patient ID: [name] [name] MRN: [id number] DOB/AGE: 59-Feb-1962 59 y.o.  Admit date: 02/03/2021 Discharge date: 02/04/2021  Admission Diagnoses:  Principal Problem:   Unilateral primary osteoarthritis, right hip Active Problems:   Status post total replacement of right hip   Discharge Diagnoses:  Same  Past Medical History:  Diagnosis Date  . Anxiety   . Chickenpox   . Complication of anesthesia    anxiety and panic  . Depression   . Diabetes mellitus without complication (HCC)   . Dog bite of face   . Elevated liver enzymes    ABD US done 08/2016: fatty liver  . Fatty liver    ABD US done 08/2016: fatty liver  . History of kidney stones   . Hyperlipidemia    no longer needs meds  . Hypertension    pt states no longer needs meds  . Hypothyroidism    No longer takes meds  . OA (osteoarthritis) of hip    right  . Obesity   . Polycythemia    Not the vera  . Schizophrenia (HCC)   . Sleep apnea    uses a CPAP  . 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 Thyroid disease    no meds    Surgeries: Procedure(s): RIGHT TOTAL HIP ARTHROPLASTY ANTERIOR APPROACH on 02/03/2021   Consultants:   Discharged Condition: Improved  Hospital Course: [name] [name] is an 60 y.o. female who was admitted 02/03/2021 for operative treatment ofUnilateral primary osteoarthritis, right hip. Patient has severe unremitting pain that affects sleep, daily activities, and work/hobbies. After pre-op clearance the patient was taken to the operating room on 02/03/2021 and underwent  Procedure(s): RIGHT TOTAL HIP ARTHROPLASTY ANTERIOR APPROACH.    Patient was given perioperative antibiotics:  Anti-infectives (From admission, onward)   Start     Dose/Rate Route Frequency Ordered Stop   02/03/21 1430  ceFAZolin (ANCEF) IVPB 1 g/50 mL premix        1 g 100 mL/hr over 30 Minutes Intravenous Every 6 hours 02/03/21 1131 02/03/21 2152   02/03/21 0600  ceFAZolin (ANCEF) IVPB 2g/100 mL premix         2 g 200 mL/hr over 30 Minutes Intravenous On call to O.R. 02/03/21 0534 02/03/21 [id number]       Patient was given sequential compression devices, early ambulation, and chemoprophylaxis to prevent DVT.  Patient benefited maximally from hospital stay and there were no complications.    Recent vital signs:  Patient Vitals for the past 24 hrs:  BP Temp Temp src Pulse Resp SpO2  02/04/21 0944 (!) 159/81 98.4 F (36.9 C) Oral 73 - 100 %  02/04/21 0519 126/65 98.9 F (37.2 C) Oral 72 12 100 %  02/04/21 0138 127/67 99.1 F (37.3 C) Oral 71 12 99 %  02/03/21 2216 (!) 154/82 100.3 F (37.9 C) Oral 74 14 98 %  02/03/21 1721 (!) 148/73 98.5 F (36.9 C) Oral 74 16 97 %  02/03/21 1510 - - - - - 98 %  02/03/21 1330 (!) 141/74 - - 70 - 98 %  02/03/21 1323 - 98.6 F (37 C) Oral - 12 -     Recent laboratory studies:  Recent Labs    02/04/21 0817  WBC 11.4*  HGB 12.9  HCT 40.7  PLT 185  NA 138  K 3.9  CL 108  CO2 18*  BUN 19  CREATININE 0.68  GLUCOSE 108*  CALCIUM 8.9     Discharge Medications:   Allergies as  of 02/04/2021      Reactions   Dairy Aid [lactase] Anaphylaxis, Rash   Metformin And Related    Nausea, diarrhea, malaise   Citric Acid Rash      Medication List    STOP taking these medications   aspirin 81 MG EC tablet Replaced by: aspirin 81 MG chewable tablet     TAKE these medications   acetaminophen 500 MG tablet Commonly known as: TYLENOL Take 1,000 mg by mouth daily.   Advocate Insulin Pen Needles 31G X 5 MM Misc Generic drug: Insulin Pen Needle 1 application by Does not apply route daily.   aspirin 81 MG chewable tablet Chew 1 tablet (81 mg total) by mouth 2 (two) times daily. Replaces: aspirin 81 MG EC tablet   atorvastatin 80 MG tablet Commonly known as: LIPITOR Take 1 tablet (80 mg total) by mouth daily.   empagliflozin 25 MG Tabs tablet Commonly known as: JARDIANCE Take 25 mg by mouth daily.   insulin glargine 100 UNIT/ML Solostar  Pen Commonly known as: LANTUS Inject 21 Units into the skin daily.   methocarbamol 500 MG tablet Commonly known as: ROBAXIN Take 1 tablet (500 mg total) by mouth every 6 (six) hours as needed for muscle spasms.   OVER THE COUNTER MEDICATION Take 1 capsule by mouth daily. Pre/Pro biotic One daily   OVER THE COUNTER MEDICATION Take 25-35 mg by mouth at bedtime. THC nightly chewable tablet   oxyCODONE 5 MG immediate release tablet Commonly known as: Oxy IR/ROXICODONE Take 1-2 tablets (5-10 mg total) by mouth every 6 (six) hours as needed for moderate pain (pain score 4-6).   Vitamin D (Ergocalciferol) 1.25 MG (50000 UNIT) Caps capsule Commonly known as: DRISDOL Take 50,000 Units by mouth every Friday.            Durable Medical Equipment  (From admission, onward)         Start     Ordered   02/03/21 1132  DME 3 n 1  Once        02/03/21 1131   02/03/21 1132  DME Walker rolling  Once       Question Answer Comment  Walker: With 5 Inch Wheels   Patient needs a walker to treat with the following condition Status post total replacement of right hip      02/03/21 1131          Diagnostic Studies: DG Pelvis Portable  Result Date: 02/03/2021 CLINICAL DATA:  Status post total hip arthroplasty EXAM: PORTABLE PELVIS 1-2 VIEWS COMPARISON:  Intraoperative right hip images February 03, 2021 FINDINGS: Frontal pelvis obtained. There is a total hip replacement right with prosthetic components well-seated on frontal view. No fracture or dislocation. There is moderate narrowing of the left hip joint. No erosive change. Soft tissue air noted on the right. IMPRESSION: Total hip replacement right with prosthetic components well-seated on frontal view. Acute postoperative change on the right. Moderate narrowing left hip joint. No fracture or dislocation. Comment: Bony overgrowth along each superolateral acetabulum potentially places patient at increased risk for femoroacetabular impingement.  Electronically Signed   By: Bretta Bang III M.D.   On: 02/03/2021 13:13   DG C-Arm 1-60 Min-No Report  Result Date: 02/03/2021 Fluoroscopy was utilized by the requesting physician.  No radiographic interpretation.   DG HIP OPERATIVE UNILAT W OR W/O PELVIS RIGHT  Result Date: 02/03/2021 CLINICAL DATA:  Total hip replacement EXAM: OPERATIVE RIGHT HIP  2 VIEWS TECHNIQUE: Fluoroscopic spot image(s) were  submitted for interpretation post-operatively. COMPARISON:  July 26, 2020 pelvis and right hip radiographs FLUOROSCOPY TIME:  0 minutes 25 seconds; 3.27 mGy; 5 acquired images FINDINGS: Frontal and lateral views show evidence of total hip replacement right with prosthetic components well-seated. No fracture or dislocation. No erosion. IMPRESSION: Total hip replacement right with prosthetic components well-seated. No fracture or dislocation evident. Electronically Signed   By: Bretta Bang III M.D.   On: 02/03/2021 10:01    Disposition: Discharge disposition: 01-Home or Self Care          Follow-up Information    Kathryne Hitch, MD Follow up in 2 week(s).   Specialty: Orthopedic Surgery Contact information: [address] [place] [address] [phone]        [facility] Follow up.   Specialty: Home Health Services Why: agency will provide home health therapy Contact information: [address] [place] [address] [place] [address] [phone]                Signed: Kathryne Hitch 02/04/2021, 11:47 AM

## 2021-02-04 NOTE — Progress Notes (Signed)
Subjective: 1 Day Post-Op Procedure(s) (LRB): RIGHT TOTAL HIP ARTHROPLASTY ANTERIOR APPROACH (Right) Patient reports pain as moderate.    Objective: Vital signs in last 24 hours: Temp:  [98.4 F (36.9 C)-100.3 F (37.9 C)] 98.4 F (36.9 C) (03/26 0944) Pulse Rate:  [70-74] 73 (03/26 0944) Resp:  [12-16] 12 (03/26 0519) BP: (126-159)/(65-82) 159/81 (03/26 0944) SpO2:  [97 %-100 %] 100 % (03/26 0944)  Intake/Output from previous day: 03/25 0701 - 03/26 0700 In: 2850.8 [P.O.:620; I.V.:1980.8; IV Piggyback:250] Out: 2925 [Urine:2825; Blood:100] Intake/Output this shift: Total I/O In: 360 [P.O.:360] Out: 200 [Urine:200]  Recent Labs    02/04/21 0817  HGB 12.9   Recent Labs    02/04/21 0817  WBC 11.4*  RBC 4.36  HCT 40.7  PLT 185   Recent Labs    02/04/21 0817  NA 138  K 3.9  CL 108  CO2 18*  BUN 19  CREATININE 0.68  GLUCOSE 108*  CALCIUM 8.9   No results for input(s): LABPT, INR in the last 72 hours.  Sensation intact distally Intact pulses distally Dorsiflexion/Plantar flexion intact Incision: dressing C/D/I   Assessment/Plan: 1 Day Post-Op Procedure(s) (LRB): RIGHT TOTAL HIP ARTHROPLASTY ANTERIOR APPROACH (Right) Up with therapy Discharge home with home health    Patient's anticipated LOS is less than 2 midnights, meeting these requirements: - Younger than 24 - Lives within 1 hour of care - Has a competent adult at home to recover with post-op recover - NO history of  - Chronic pain requiring opiods  - Diabetes  - Coronary Artery Disease  - Heart failure  - Heart attack  - Stroke  - DVT/VTE  - Cardiac arrhythmia  - Respiratory Failure/COPD  - Renal failure  - Anemia  - Advanced Liver disease       Mcarthur Rossetti 02/04/2021, 11:46 AM

## 2021-02-04 NOTE — Progress Notes (Signed)
Physical Therapy Treatment Patient Details Name: [name] [name] MRN: [id number] DOB: Nov 16, 1960 Today's Date: 02/04/2021    History of Present Illness Pt s/p R THR and with hx of DM, OA, Schizo-affective disorder, and CVA (2021) - min residual effect    PT Comments    Pt continues to progress well with mobility and eager for dc home.  Pt up to ambulate in hall, negotiated stairs, reviewed car transfers and up to bathroom for toileting.   Follow Up Recommendations  Home health PT;Follow surgeon's recommendation for DC plan and follow-up therapies     Equipment Recommendations  Rolling walker with 5" wheels    Recommendations for Other Services       Precautions / Restrictions Precautions Precautions: Fall Restrictions Weight Bearing Restrictions: No Other Position/Activity Restrictions: WBAT    Mobility  Bed Mobility Overal bed mobility: Needs Assistance Bed Mobility: Supine to Sit;Sit to Supine     Supine to sit: Min guard Sit to supine: Supervision   General bed mobility comments: Pt up in chair on arrival and in bathroom at session end    Transfers Overall transfer level: Needs assistance Equipment used: Rolling walker (2 wheeled) Transfers: Sit to/from Stand Sit to Stand: Supervision         General transfer comment: cues for LE management and use of UEs to self assist  Ambulation/Gait Ambulation/Gait assistance: Supervision Gait Distance (Feet): 150 Feet Assistive device: Rolling walker (2 wheeled) Gait Pattern/deviations: Step-to pattern;Step-through pattern;Decreased step length - right;Decreased step length - left;Shuffle;Trunk flexed Gait velocity: decr   General Gait Details: Pt with L shoe on and footie on R to equalize leg length and ease advancing R LE   Stairs Stairs: Yes Stairs assistance: Min assist Stair Management: Two rails;Step to pattern;Forwards Number of Stairs: 5 General stair comments: cues for sequence   Wheelchair  Mobility    Modified Rankin (Stroke Patients Only)       Balance Overall balance assessment: Needs assistance Sitting-balance support: No upper extremity supported;Feet supported Sitting balance-Leahy Scale: Good     Standing balance support: No upper extremity supported Standing balance-Leahy Scale: Fair                              Cognition Arousal/Alertness: Awake/alert Behavior During Therapy: WFL for tasks assessed/performed Overall Cognitive Status: Within Functional Limits for tasks assessed                                        Exercises Total Joint Exercises Ankle Circles/Pumps: AROM;Both;15 reps;Supine Quad Sets: AROM;Both;10 reps;Supine Heel Slides: AAROM;Right;20 reps;Supine Hip ABduction/ADduction: AAROM;Right;15 reps;Supine    General Comments        Pertinent Vitals/Pain Pain Assessment: 0-10 Pain Score: 5  Pain Location: R hip Pain Descriptors / Indicators: Aching;Sore Pain Intervention(s): Limited activity within patient's tolerance;Monitored during session;Premedicated before session    Home Living                      Prior Function            PT Goals (current goals can now be found in the care plan section) Acute Rehab PT Goals Patient Stated Goal: Regain IND PT Goal Formulation: With patient Time For Goal Achievement: 02/10/21 Potential to Achieve Goals: Good Progress towards PT goals: Progressing toward goals    Frequency  7X/week      PT Plan Current plan remains appropriate    Co-evaluation              AM-PAC PT "6 Clicks" Mobility   Outcome Measure  Help needed turning from your back to your side while in a flat bed without using bedrails?: A Little Help needed moving from lying on your back to sitting on the side of a flat bed without using bedrails?: A Little Help needed moving to and from a bed to a chair (including a wheelchair)?: A Little Help needed standing up  from a chair using your arms (e.g., wheelchair or bedside chair)?: A Little Help needed to walk in hospital room?: A Little Help needed climbing 3-5 steps with a railing? : A Little 6 Click Score: 18    End of Session Equipment Utilized During Treatment: Gait belt Activity Tolerance: Patient tolerated treatment well Patient left: Other (comment);with call bell/phone within reach (bathroom) Nurse Communication: Mobility status PT Visit Diagnosis: Difficulty in walking, not elsewhere classified (R26.2)     Time: 1610-9604 PT Time Calculation (min) (ACUTE ONLY): 29 min  Charges:  $Gait Training: 8-22 mins $Therapeutic Exercise: 8-22 mins $Therapeutic Activity: 8-22 mins                     Mauro Kaufmann PT Acute Rehabilitation Services Pager [phone] Office [phone]    Eloy Fehl 02/04/2021, 12:48 PM

## 2021-02-04 NOTE — TOC Progression Note (Signed)
Transition of Care [facility]) - Progression Note    Patient Details  Name: Karess Harner Trautman MRN: [id number] Date of Birth: 10/11/1961  Transition of Care [facility]) CM/SW Contact  Joaquin Courts, RN Phone Number: 02/04/2021, 10:51 AM  Clinical Narrative:    CM spoke with patient, [facility]) to provide home health therapy. [employer] to deliver rolling walker to bedside, Patient declines 3in1.   Expected Discharge Plan: [place] Barriers to Discharge: No Barriers Identified  Expected Discharge Plan and Services Expected Discharge Plan: [place]   Discharge Planning Services: CM Consult Post Acute Care Choice: [place] arrangements for the past 2 months: Single Family Home                 DME Arranged: Walker rolling DME Agency: [employer] Date DME Agency Contacted: 02/04/21 Time DME Agency Contacted: 63 Representative spoke with at DME Agency: [place]: PT [place]: [facility] [employer] [facility] [employer]     Representative spoke with at [place]: pre arranged in MD office   Social Determinants of Health ([place]) Interventions    Readmission Risk Interventions No flowsheet data found.

## 2021-02-06 ENCOUNTER — Encounter (HOSPITAL_COMMUNITY): Payer: Self-pay | Admitting: Orthopaedic Surgery

## 2021-02-16 ENCOUNTER — Encounter: Payer: Self-pay | Admitting: Orthopaedic Surgery

## 2021-02-16 ENCOUNTER — Ambulatory Visit (INDEPENDENT_AMBULATORY_CARE_PROVIDER_SITE_OTHER): Payer: Medicare PPO | Admitting: Orthopaedic Surgery

## 2021-02-16 DIAGNOSIS — Z96641 Presence of right artificial hip joint: Secondary | ICD-10-CM

## 2021-02-16 DIAGNOSIS — M2170 Unequal limb length (acquired), unspecified site: Secondary | ICD-10-CM

## 2021-02-16 NOTE — Progress Notes (Signed)
The patient is 2 weeks tomorrow status post a right total hip arthroplasty.  She has been doing well and is only on a baby aspirin twice a day.  She was on it once a day prior to surgery so she can go down to once a day.  She is not taking pain medication at all.  She is able with a cane.  There is a noted leg length discrepancy with the right operative side longer than the left.  She will need some type of shoe insert and I gave her prescription for that.  Her staples were removed and Steri-Strips applied.  There is a hematoma but no significant seroma.  She will alternate ice and heat and massaging this area and it should improve with time.  I would like to see her back in 4 weeks to see how she is doing overall.  No x-rays are needed at that visit.  All question concerns were answered and addressed.

## 2021-03-01 ENCOUNTER — Encounter: Payer: Medicare PPO | Attending: Physical Medicine & Rehabilitation | Admitting: Physical Medicine & Rehabilitation

## 2021-03-01 ENCOUNTER — Other Ambulatory Visit: Payer: Self-pay

## 2021-03-01 ENCOUNTER — Encounter: Payer: Self-pay | Admitting: Physical Medicine & Rehabilitation

## 2021-03-01 VITALS — BP 124/78 | HR 74 | Temp 98.2°F | Ht 63.0 in | Wt 212.6 lb

## 2021-03-01 DIAGNOSIS — M1611 Unilateral primary osteoarthritis, right hip: Secondary | ICD-10-CM | POA: Insufficient documentation

## 2021-03-01 DIAGNOSIS — I639 Cerebral infarction, unspecified: Secondary | ICD-10-CM | POA: Diagnosis present

## 2021-03-01 DIAGNOSIS — I6381 Other cerebral infarction due to occlusion or stenosis of small artery: Secondary | ICD-10-CM

## 2021-03-01 NOTE — Patient Instructions (Addendum)
PLEASE FEEL FREE TO CALL OUR OFFICE WITH ANY PROBLEMS OR QUESTIONS [phone])  GREAT WORK!

## 2021-03-01 NOTE — Progress Notes (Signed)
Subjective:    Patient ID: [name] [name], female    DOB: [birth year], 60 y.o.   MRN: [id number]  HPI Nene is here in follow up of her CVA and right hip pain. She had a hip replacement by Dr. Magnus Ivan on 02/03/21 and things went very well. She's now walking without any right hip pain and even her knee pain has improved for the time being. She does have a 1" leg length discrepancy on the right.   From a standpoint of her stroke she has minimal sensory loss. Her coordination has improved, and she feels that she's almost back to baseline  Her most recent hgb A1c was 6.3   Pain Inventory Average Pain 0 Pain Right Now 0 My pain is No pain today here for stroke follow up  LOCATION OF PAIN    BOWEL Number of stools per week: 7 Oral laxative use No  Type of laxative noneEnema or suppository use No  History of colostomy No  Incontinent No   BLADDER Normal In and out cath, frequency N/A Able to self cath No  Bladder incontinence No  Frequent urination No  Leakage with coughing No  Difficulty starting stream No  Incomplete bladder emptying No    Mobility ability to climb steps?  yes do you drive?  yes  Function disabled: date disabled 2013  Neuro/Psych depression anxiety  Prior Studies Any changes since last visit?  yes New Right hip replacement in March 2022  Physicians involved in your care Any changes since last visit?  no   Family History  Problem Relation Age of Onset  . Alcohol abuse Mother   . Arthritis Mother   . Diabetes Mother   . Hypertension Mother   . Hyperlipidemia Mother   . Colon polyps Mother   . Other Mother        blood clotting disorder  . Alcohol abuse Father   . Arthritis Father   . Asthma Father   . Heart disease Father   . Hypertension Father   . Hyperlipidemia Father   . Alcohol abuse Brother   . Arthritis Brother   . Drug abuse Brother    Social History   Socioeconomic History  . Marital status: Married    Spouse  name: Smitty Cords  . Number of children: 1  . Years of education: Not on file  . Highest education level: Not on file  Occupational History  . Occupation: former Research scientist (life sciences)  Tobacco Use  . Smoking status: Former Smoker    Quit date: 2004    Years since quitting: 18.3  . Smokeless tobacco: Never Used  Vaping Use  . Vaping Use: Never used  Substance and Sexual Activity  . Alcohol use: Not Currently    Alcohol/week: 3.0 standard drinks    Types: 1 Glasses of wine, 1 Cans of beer, 1 Shots of liquor per week  . Drug use: No    Comment: THC edible 25 to 50 mg QHS  . Sexual activity: Yes    Partners: Male    Birth control/protection: Post-menopausal  Other Topics Concern  . Not on file  Social History Narrative  . Not on file   Social Determinants of Health   Financial Resource Strain: Not on file  Food Insecurity: Not on file  Transportation Needs: Not on file  Physical Activity: Not on file  Stress: Not on file  Social Connections: Not on file   Past Surgical History:  Procedure Laterality Date  . BREAST  BIOPSY Right    needle bx more than 15 yrs benign, no scar visible   . LACERATION REPAIR N/A 12/13/2017   Procedure: Closure of nasal lacerations with one rotator graft and one advanced flap with skin graft, closure of left nasal laceration, closure of left ear.;  Surgeon: Suzanna Obey, MD;  Location: [facility];  Service: ENT;  Laterality: N/A;  . TOTAL HIP ARTHROPLASTY Right 02/03/2021   Procedure: RIGHT TOTAL HIP ARTHROPLASTY ANTERIOR APPROACH;  Surgeon: Kathryne Hitch, MD;  Location: [facility];  Service: Orthopedics;  Laterality: Right;   Past Medical History:  Diagnosis Date  . Anxiety   . Chickenpox   . Complication of anesthesia    anxiety and panic  . Depression   . Diabetes mellitus without complication (HCC)   . Dog bite of face   . Elevated liver enzymes    ABD US done 08/2016: fatty liver  . Fatty liver    ABD US done 08/2016: fatty liver  . History  of kidney stones   . Hyperlipidemia    no longer needs meds  . Hypertension    pt states no longer needs meds  . Hypothyroidism    No longer takes meds  . OA (osteoarthritis) of hip    right  . Obesity   . Polycythemia    Not the vera  . Schizophrenia (HCC)   . Sleep apnea    uses a CPAP  . 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 Thyroid disease    no meds   BP 124/78   Pulse 74   Temp 98.2 F (36.8 C)   Ht 5\' 3"  (1.6 m)   Wt 212 lb 9.6 oz (96.4 kg)   SpO2 97%   BMI 37.66 kg/m   Opioid Risk Score:   Fall Risk Score:  `1  Depression screen PHQ 2/9  Depression screen [facility] 2/9 03/01/2021 08/08/2020 11/08/2017  Decreased Interest 1 0 2  Down, Depressed, Hopeless 1 0 2  PHQ - 2 Score 2 0 4  Altered sleeping - 2 3  Tired, decreased energy - 1 3  Change in appetite - 0 2  Feeling bad or failure about yourself  - 0 0  Trouble concentrating - 1 3  Moving slowly or fidgety/restless - 0 3  Suicidal thoughts - 0 1  PHQ-9 Score - 4 19  Difficult doing work/chores - Not difficult at all -   Review of Systems  All other systems reviewed and are negative.      Objective:   Physical Exam General: No acute distress HEENT: EOMI, oral membranes moist Cards: reg rate  Chest: normal effort Abdomen: Soft, NT, ND Skin: dry, intact Extremities: no edema Psych: pleasant and appropriate Neuro: Alert and oriented x 3. Normal insight and awareness. Intact Memory. Normal language and speech. Cranial nerve exam unremarkable. Motor exam nearly normal. No gross sensory abnl. Normal fmc. Musculoskeletal: gait improved. Good weight shift and balance. Pelvis is symmetrical in stance despite LL discrepancy       Assessment & Plan:  1. Left thalamic infarct             -continue HEP 2. Diabetes II 3. OA of right hip S/P THA 4. Hx of Schizophrenia/Bipolar/PTSD 5. Left knee pain/contusion, pre-patellar bursitis--improved for now       Plan: 1. Continue HEP. 2. F/U PER Dr. Allie Bossier regarding right hip as well as left knee 3. Diabetes per primary.     15 minutes of  face to face patient care time were spent during this visit. All questions were encouraged and answered.   f/u with me prn

## 2021-03-21 ENCOUNTER — Encounter: Payer: Self-pay | Admitting: Orthopaedic Surgery

## 2021-03-21 ENCOUNTER — Ambulatory Visit (INDEPENDENT_AMBULATORY_CARE_PROVIDER_SITE_OTHER): Payer: Medicare PPO | Admitting: Orthopaedic Surgery

## 2021-03-21 DIAGNOSIS — Z96641 Presence of right artificial hip joint: Secondary | ICD-10-CM

## 2021-03-21 NOTE — Progress Notes (Signed)
The patient is now 6 weeks status post a right total hip arthroplasty.  She says she is doing well overall.  She has good range of motion and strength.  We had recommended an insert for her left shoe but she went to an orthotic specialist who recommended she does get a small insert from [place] because the discrepancy was not much.  She is comfortable doing that and wearing her regular shoes.  Overall she is pleased with the results.  She says her knee is feeling better as well.  She is someone who does enjoy yoga.  She understands that I want her to not extend her leg behind her for at least another 4 weeks on that right operative side.  She says she feels much better than what she did preop.  She is walking without assistive device.  She has fluid and full range of motion of that right hip with minimal difficulty and pain.  We will see her back in 6 months.  At that visit I would like a standing low AP pelvis and lateral of her right operative hip.  If there is any issues with either knee we can always see her for her knee and x-ray that if needed.  All questions and concerns were answered and addressed.

## 2021-04-05 ENCOUNTER — Encounter (INDEPENDENT_AMBULATORY_CARE_PROVIDER_SITE_OTHER): Payer: Self-pay

## 2021-07-05 ENCOUNTER — Ambulatory Visit: Payer: Medicare PPO | Admitting: Adult Health

## 2021-07-05 ENCOUNTER — Encounter: Payer: Self-pay | Admitting: Adult Health

## 2021-07-05 VITALS — BP 145/83 | HR 71 | Ht 63.0 in | Wt 216.0 lb

## 2021-07-05 DIAGNOSIS — I6381 Other cerebral infarction due to occlusion or stenosis of small artery: Secondary | ICD-10-CM

## 2021-07-05 DIAGNOSIS — E782 Mixed hyperlipidemia: Secondary | ICD-10-CM

## 2021-07-05 DIAGNOSIS — I639 Cerebral infarction, unspecified: Secondary | ICD-10-CM

## 2021-07-05 DIAGNOSIS — E119 Type 2 diabetes mellitus without complications: Secondary | ICD-10-CM | POA: Diagnosis not present

## 2021-07-05 DIAGNOSIS — I10 Essential (primary) hypertension: Secondary | ICD-10-CM

## 2021-07-05 NOTE — Progress Notes (Signed)
[facility] [address] [place] [address] [phone]       STROKE FOLLOW UP NOTE  [name] Date of Birth:  1961-08-04 Medical Record Number:  [id number]   Reason for Referral: stroke follow up    SUBJECTIVE:   CHIEF COMPLAINT:  Chief Complaint  Patient presents with   Follow-up    Rm 3 with spouse bruce  Pt is well and stable.     HPI:   Today, 07/05/2021, [name] [name] returns for 23-month stroke follow-up accompanied by her husband.  Overall stable.  Denies new or reoccurring stroke/TIA symptoms.  Compliant on aspirin and atorvastatin without side effects.  Blood pressure today 145/83.  Most recent A1c 6.5 (04/2021).  She did undergo hip replacement by Dr. Magnus Ivan on 02/03/2021 without complication.  Hip pain greatly improved and will only need to use cane on uneven ground.  No new concerns at this time.     History provided for reference purposes only Update 01/04/2021 JM: [name] [name] returns for 15-month stroke follow-up unaccompanied.  She has been doing well since prior visit without new stroke/TIA symptoms She does report continued gait impairment with ongoing use of cane and plans on undergoing hip replacement on 3/25 She denies any residual RLE weakness post stroke Continues to ambulate with a cane  Reports compliance on aspirin and atorvastatin -denies side effects Blood pressure today 130/77 Glucose levels have been stable and reports recent A1c 7.0  She has since completed sleep smart trial for continued nightly use of CPAP  No further concerns at this time.  Initial visit 09/05/2020 JM: [name] [name] is being seen for hospital follow-up accompanied by her husband.  She was discharged home on 07/28/2020 with recommendation of outpatient PT and OT.  Reports she has been doing well since discharge with residual right-sided heaviness sensation, right foot dragging with increased fatigue and slower movements on right side.   Also reports increased fatigue and decreased activity tolerance which has been slowly improving.  She was evaluated by PT/OT without additional therapy needs and continues to do exercises at home.  Continues to participate in sleep smart trial reporting tolerance to CPAP.  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  She has follow-up visit in December to further discuss possible replacement procedure.  Denies new or worsening stroke/TIA symptoms.  Completed 3 weeks DAPT and remains on aspirin alone without bleeding or bruising.  Remains on atorvastatin 80 mg daily without myalgias.  Blood pressure today 123/65.  Glucose levels have been stable around 100-120 and had recent lab work by PCP including A1c but unable to view via epic.  No further concerns at this time.  Stroke admission 07/12/2020 [name] [name] is a 60 y.o. female with history of migraine with aura who presented on 07/12/2020 with right-sided weakness and numbness and some difficulty with expressing herself w/ HA. Personally reviewed hospitalization pertinent progress notes, lab work and imaging with summary provided. Evaluated by Dr. Pearlean Brownie with stroke work up revealing left thalamic stroke secondary to small vessel disease. Recommend DAPT for 3 weeks then aspirin alone. Hx of HTN stable with long term BP goal normotensive range. LDL 191 and initiated atorvastatin 80mg  daily. Uncontrolled DM with A1c 11.6. Other stroke risk factors include THC use, former tobacco use, obesity and hx of migraines. No prior stroke history. Patient currently participating in sleep trial randomized to CPAP  treatment.  Evaluated by therapy and recommended discharge to CIR for ongoing therapy needs.  Stroke:   L thalamic lacunar infarct secondary to small vessel disease source Code Stroke CT head No acute abnormality. ASPECTS 10.    CTA head & neck no  ELVO. 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 Aortic atherosclerosis.  CT perfusion negative MRI  Small subacute L thalamic lacune  2D Echo left ventricular ejection fraction 70 to 75%.  No cardiac source of embolism.   LDL 191 HgbA1c  11.6 UDS +THC VTE prophylaxis - Lovenox 40 mg sq daily  No antithrombotic prior to admission, now on aspirin 325 mg daily. Decrease aspirin to 81 and add plavix 75 mg daily. Continue DAPT x 3 weeks then aspirin alone Therapy recommendations: CIR disposition:  CIR      ROS:   14 system review of systems performed and negative with exception of those listed in HPI  PMH:  Past Medical History:  Diagnosis Date   Anxiety    Chickenpox    Complication of anesthesia    anxiety and panic   Depression    Diabetes mellitus without complication (HCC)    Dog bite of face    Elevated liver enzymes    ABD US done 08/2016: fatty liver   Fatty liver    ABD US done 08/2016: fatty liver   History of kidney stones    Hyperlipidemia    no longer needs meds   Hypertension    pt states no longer needs meds   Hypothyroidism    No longer takes meds   OA (osteoarthritis) of hip    right   Obesity    Polycythemia    Not the vera   Schizophrenia (HCC)    Sleep apnea    uses a CPAP   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   Thyroid disease    no meds    PSH:  Past Surgical History:  Procedure Laterality Date   BREAST BIOPSY Right    needle bx more than 15 yrs benign, no scar visible    LACERATION REPAIR N/A 12/13/2017   Procedure: Closure of nasal lacerations with one rotator graft and one advanced flap with skin graft, closure of left nasal laceration, closure of left ear.;  Surgeon: Suzanna Obey, MD;  Location: [facility];  Service: ENT;  Laterality: N/A;   TOTAL HIP ARTHROPLASTY Right 02/03/2021   Procedure: RIGHT TOTAL HIP ARTHROPLASTY ANTERIOR APPROACH;  Surgeon: Kathryne Hitch, MD;  Location: [facility];  Service: Orthopedics;   Laterality: Right;    Social History:  Social History   Socioeconomic History   Marital status: Married    Spouse name: Bruce   Number of children: 1   Years of education: Not on file   Highest education level: Not on file  Occupational History   Occupation: former Research scientist (life sciences)  Tobacco Use   Smoking status: Former    Types: Cigarettes    Quit date: 2004    Years since quitting: 18.6   Smokeless tobacco: Never  Vaping Use   Vaping Use: Never used  Substance and Sexual Activity   Alcohol use: Not Currently    Alcohol/week: 3.0 standard drinks    Types: 1 Glasses of wine, 1 Cans of beer, 1 Shots of liquor per week   Drug use: No    Comment: THC edible 25 to 50 mg QHS   Sexual activity: Yes    Partners: Male  Birth control/protection: Post-menopausal  Other Topics Concern   Not on file  Social History Narrative   Not on file   Social Determinants of Health   Financial Resource Strain: Not on file  Food Insecurity: Not on file  Transportation Needs: Not on file  Physical Activity: Not on file  Stress: Not on file  Social Connections: Not on file  Intimate Partner Violence: Not on file    Family History:  Family History  Problem Relation Age of Onset   Alcohol abuse Mother    Arthritis Mother    Diabetes Mother    Hypertension Mother    Hyperlipidemia Mother    Colon polyps Mother    Other Mother        blood clotting disorder   Alcohol abuse Father    Arthritis Father    Asthma Father    Heart disease Father    Hypertension Father    Hyperlipidemia Father    Alcohol abuse Brother    Arthritis Brother    Drug abuse Brother     Medications:   Current Outpatient Medications on File Prior to Visit  Medication Sig Dispense Refill   acetaminophen (TYLENOL) 500 MG tablet Take 1,000 mg by mouth daily.     aspirin 81 MG chewable tablet Chew 1 tablet (81 mg total) by mouth 2 (two) times daily. 30 tablet 0   atorvastatin (LIPITOR) 80 MG tablet Take  1 tablet (80 mg total) by mouth daily. 30 tablet 0   empagliflozin (JARDIANCE) 25 MG TABS tablet Take 25 mg by mouth daily.     insulin glargine (LANTUS) 100 UNIT/ML Solostar Pen Inject 21 Units into the skin daily. 15 mL 0   Insulin Pen Needle (ADVOCATE INSULIN PEN NEEDLES) 31G X 5 MM MISC 1 application by Does not apply route daily. 100 each 0   OVER THE COUNTER MEDICATION Take 1 capsule by mouth daily. Pre/Pro biotic One daily     OVER THE COUNTER MEDICATION Take 25-35 mg by mouth at bedtime. THC nightly chewable tablet     No current facility-administered medications on file prior to visit.    Allergies:   Allergies  Allergen Reactions   Dairy Aid [Lactase] Anaphylaxis and Rash   Metformin And Related     Nausea, diarrhea, malaise   Citric Acid Rash      OBJECTIVE:  Physical Exam  Vitals:   07/05/21 1229  BP: (!) 145/83  Pulse: 71  Weight: 216 lb (98 kg)  Height: 5\' 3"  (1.6 m)   Body mass index is 38.26 kg/m. No results found.  General: Obese pleasant middle-age female, seated, in no evident distress Head: head normocephalic and atraumatic.   Neck: supple with no carotid or supraclavicular bruits Cardiovascular: regular rate and rhythm, no murmurs Musculoskeletal: no deformity Skin:  no rash/petichiae Vascular:  Normal pulses all extremities   Neurologic Exam Mental Status: Awake and fully alert. Fluent speech and language. Oriented to place and time. Recent and remote memory intact. Attention span, concentration and fund of knowledge appropriate. Mood and affect appropriate.  Cranial Nerves: Pupils equal, briskly reactive to light. Extraocular movements full without nystagmus. Visual fields full to confrontation. Hearing intact. Facial sensation intact. Face, tongue, palate moves normally and symmetrically.  Motor: Normal bulk and tone. Normal strength in all tested extremity muscles. Sensory.: intact to touch , pinprick , position and vibratory sensation.   Coordination: Rapid alternating movements normal in all extremities. Finger-to-nose and heel-to-shin performed accurately bilaterally. Gait and Station:  Arises from chair without difficulty. Stance is normal. Gait demonstrates normal stride length and balance without use of assistive device and only mild favoring of right leg Reflexes: 1+ and symmetric. Toes downgoing.         ASSESSMENT/PLAN: [name] [name] is a 60 y.o. year old female presented with right-sided weakness/numbness, speech difficulty and headache on 07/12/2020 with stroke work-up revealing left thalamic lacunar infarct secondary to small vessel disease. Vascular risk factors include HTN, HLD, DM, former tobacco use, obesity and history of migraines.  Previously participating in sleep smart trial which has since been completed.      L thalamic stroke :  Recovered well without residual deficit Continue aspirin 81 mg daily  and atorvastatin 80 mg daily for secondary stroke prevention.  Discussed secondary stroke prevention measures and importance of close PCP follow up for aggressive stroke risk factor management - she does plan on switching PCP providers - she was advised to call if assistance is needed in regards to referrals HTN: BP goal <130/90.  Well-controlled monitor by PCP HLD: LDL goal <70.  Reports recent lipid panel satisfactory obtained by PCP on atorvastatin 80 mg daily (unable to view via epic) DMII: A1c goal<7.0. Recent A1c 6.5 (04/2021) On Jardiance and Lantus per PCP OSA on CPAP: Continue nightly use of CPAP R hip osteoarthritis: s/p hip replacement 02/03/2021 by Dr. Magnus Ivan    Overall stable from stroke standpoint and recommend follow-up on an as-needed basis   CC:  [facility] provider: Dr. Elmon Else, Marcos Eke, MD    I spent 27 minutes of face-to-face and non-face-to-face time with patient and husband.  This included previsit chart review, lab review, study review, electronic health record  documentation, patient education regarding stroke and etiology, secondary stroke prevention measures and importance of managing stroke risk factors and answered all other questions to patient and husband's satisfaction  Ihor Austin, AGNP-BC  [facility] [address] [place], [place] [address]  Phone [phone] Fax [phone] Note: This document was prepared with digital dictation and possible smart phrase technology. Any transcriptional errors that result from this process are unintentional.

## 2021-07-05 NOTE — Progress Notes (Signed)
I agree with the above plan 

## 2021-07-05 NOTE — Patient Instructions (Signed)
Continue aspirin 81 mg daily  and atorvastatin for secondary stroke prevention  Continue to follow up with PCP regarding cholesterol and blood pressure management  Maintain strict control of hypertension with blood pressure goal below 130/90 and cholesterol with LDL cholesterol (bad cholesterol) goal below 70 mg/dL.        Thank you for coming to see [place] at [facility]. I hope we have been able to provide you high quality care today.  You may receive a patient satisfaction survey over the next few weeks. We would appreciate your feedback and comments so that we may continue to improve ourselves and the health of our patients.

## 2021-07-19 ENCOUNTER — Other Ambulatory Visit: Payer: Self-pay | Admitting: Family Medicine

## 2021-07-19 DIAGNOSIS — Z1231 Encounter for screening mammogram for malignant neoplasm of breast: Secondary | ICD-10-CM

## 2021-07-28 ENCOUNTER — Other Ambulatory Visit (HOSPITAL_COMMUNITY): Payer: Self-pay

## 2021-08-25 ENCOUNTER — Other Ambulatory Visit: Payer: Self-pay

## 2021-08-25 ENCOUNTER — Ambulatory Visit
Admission: RE | Admit: 2021-08-25 | Discharge: 2021-08-25 | Disposition: A | Discharge status: Home / Self Care | Payer: Medicare PPO | Source: Ambulatory Visit | Attending: Family Medicine | Admitting: Family Medicine

## 2021-08-25 DIAGNOSIS — Z1231 Encounter for screening mammogram for malignant neoplasm of breast: Secondary | ICD-10-CM

## 2021-08-25 IMAGING — MG MM DIGITAL SCREENING BILAT W/ TOMO AND CAD
6 of 10 series · 6 of 30 positions shown · non-contrast
Comparison: Previous exam(s).

CLINICAL DATA: Screening.

EXAM:
DIGITAL SCREENING BILATERAL MAMMOGRAM WITH TOMOSYNTHESIS AND CAD
TECHNIQUE: Bilateral screening digital craniocaudal and mediolateral oblique
mammograms were obtained. Bilateral screening digital breast
tomosynthesis was performed. The images were evaluated with
computer-aided detection.

[R CC synth-2D]
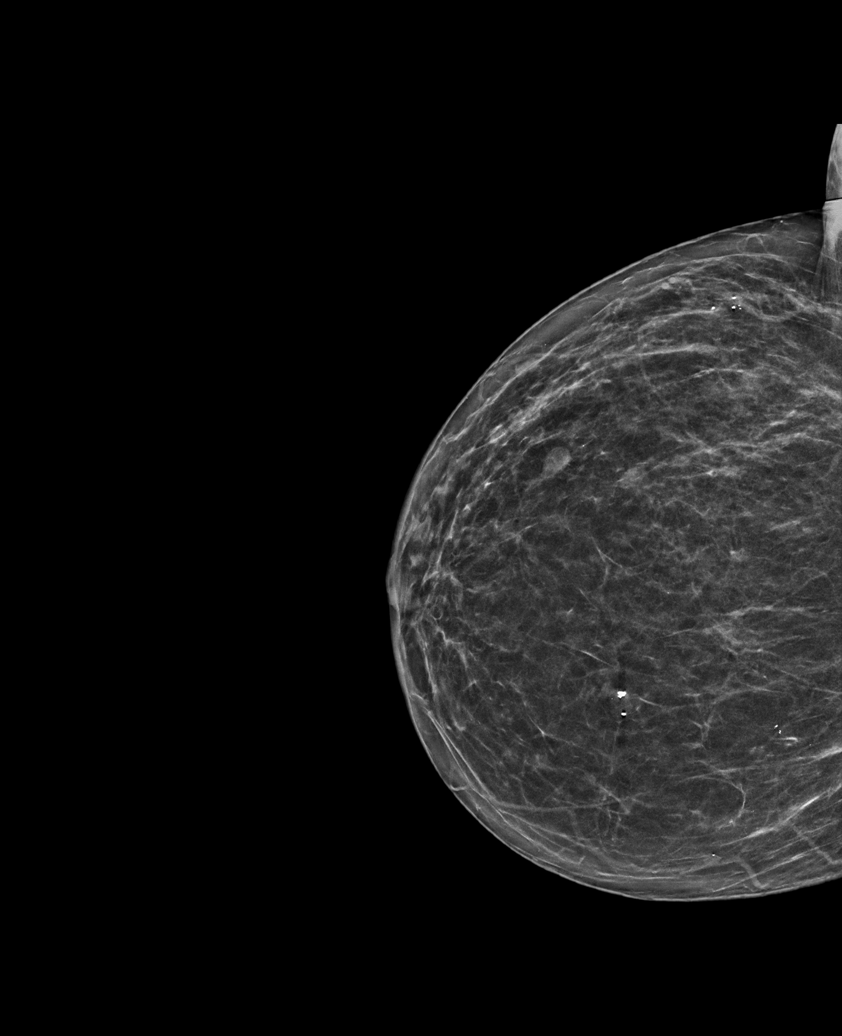

[L CC synth-2D (1 of 2)]
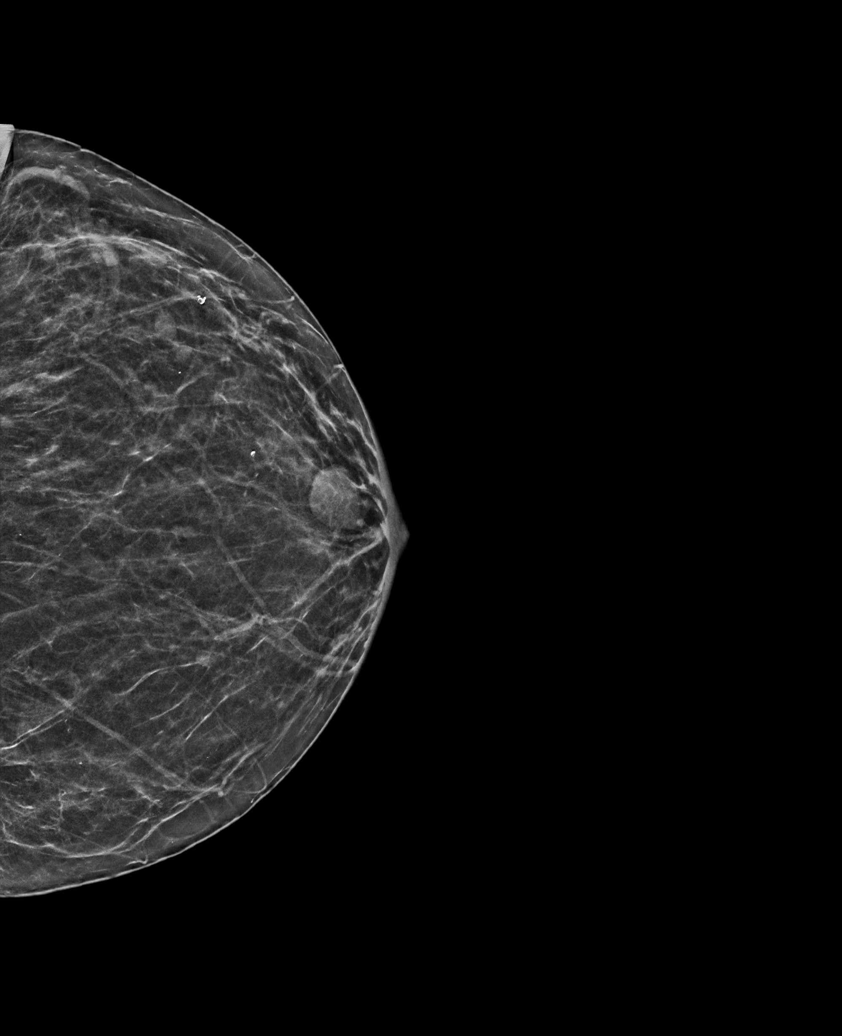

[L MLO synth-2D]
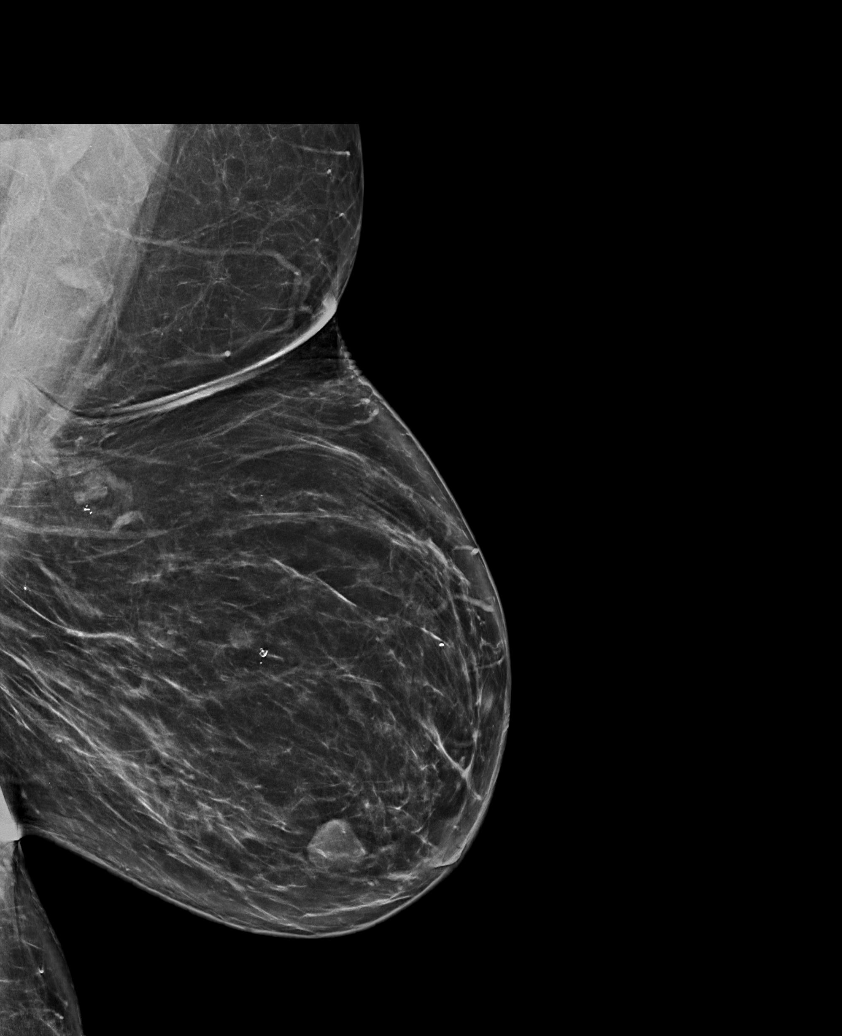

[R MLO synth-2D]
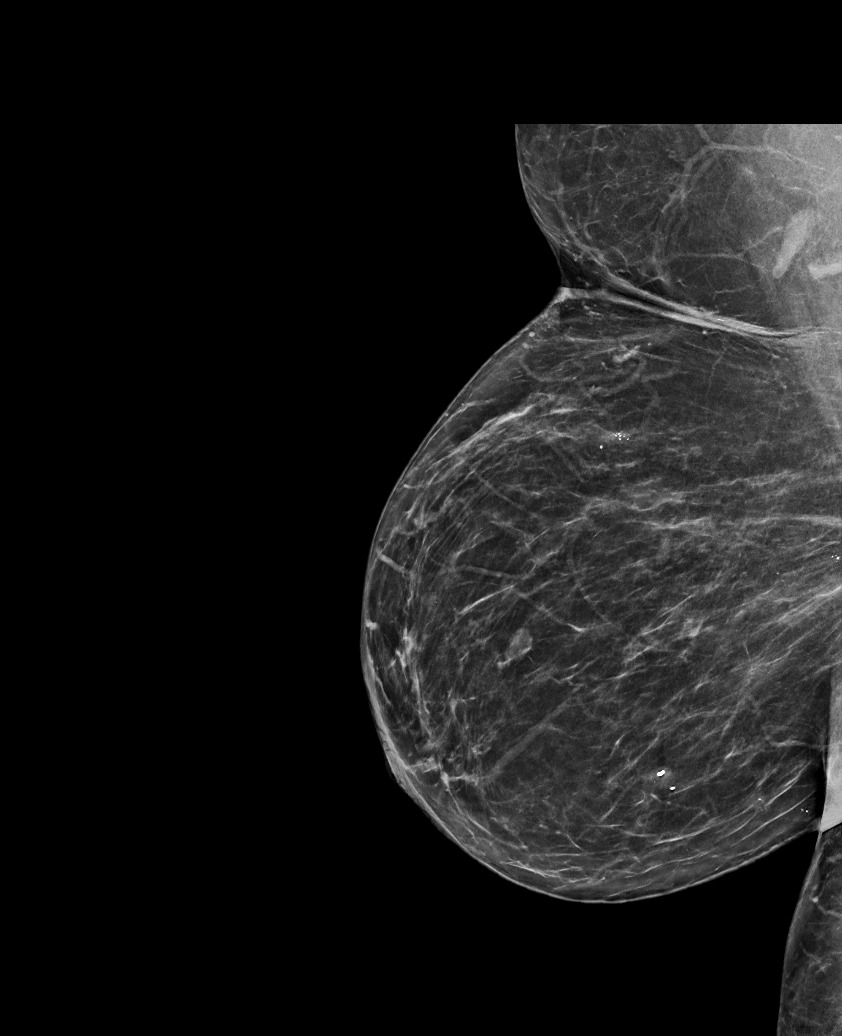

[L CC synth-2D (2 of 2)]
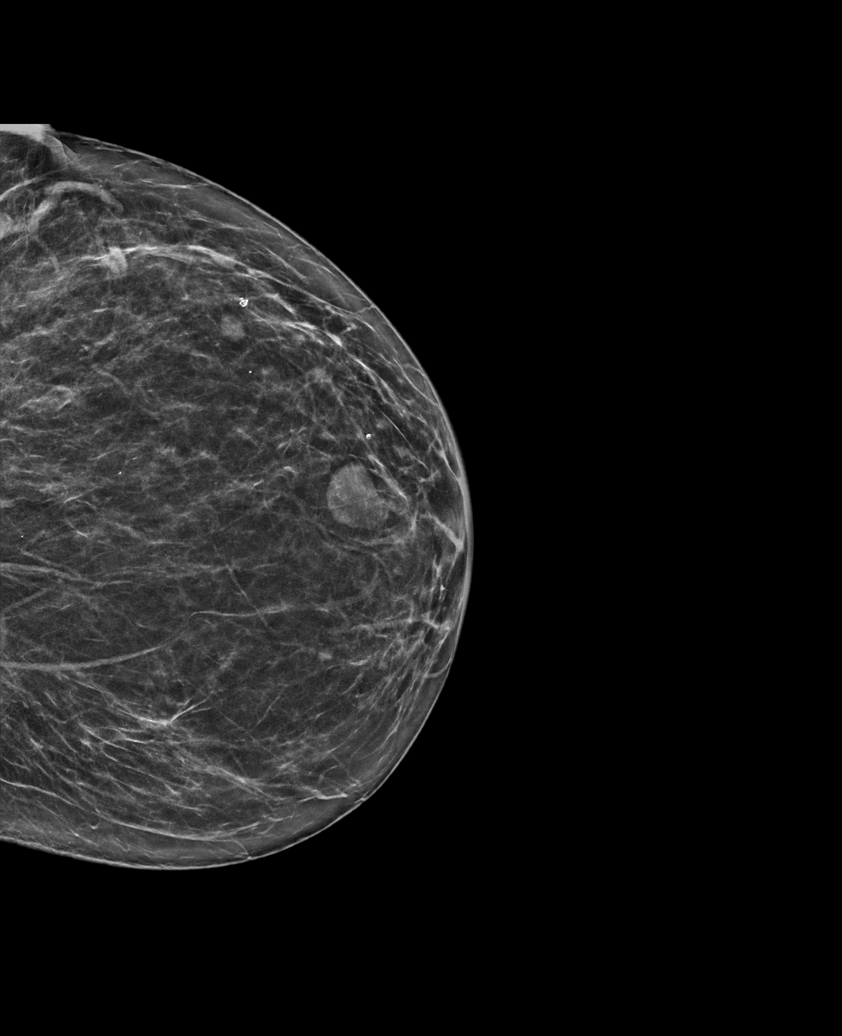

[L MLO tomo · tomo slice 41/82.0]
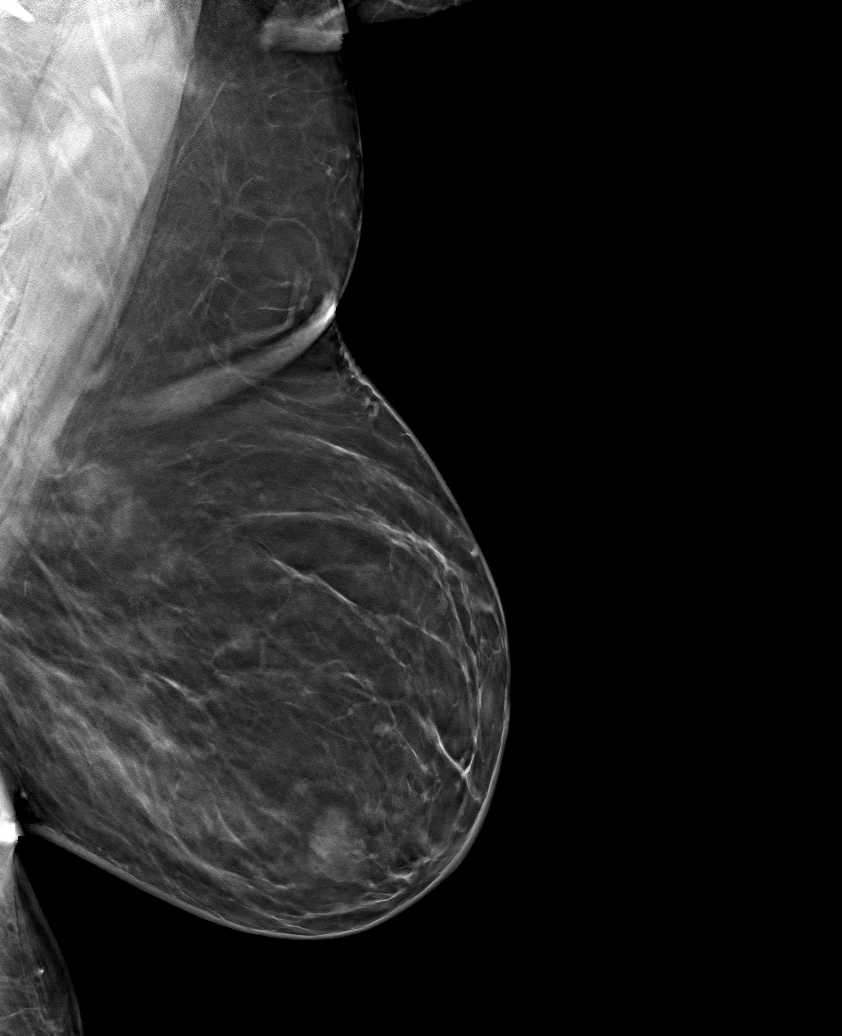

[6 of 30 positions shown; findings below may reference images not displayed]

ACR Breast Density Category b: There are scattered areas of
fibroglandular density.
FINDINGS: There are no findings suspicious for malignancy.
IMPRESSION: No mammographic evidence of malignancy. A result letter of this
screening mammogram will be mailed directly to the patient.

RECOMMENDATION:
Screening mammogram in one year. (Code:[BY])

BI-RADS CATEGORY  1: Negative.

## 2021-08-31 ENCOUNTER — Ambulatory Visit: Payer: Medicare PPO | Admitting: Sports Medicine

## 2021-08-31 ENCOUNTER — Encounter: Payer: Self-pay | Admitting: Sports Medicine

## 2021-08-31 ENCOUNTER — Other Ambulatory Visit: Payer: Self-pay

## 2021-08-31 DIAGNOSIS — E119 Type 2 diabetes mellitus without complications: Secondary | ICD-10-CM

## 2021-08-31 NOTE — Progress Notes (Signed)
Subjective: [name] [name] is a 60 y.o. female patient with history of diabetes who presents to office today for diabetic foot exam.  Patient denies any current pedal complaints states that she does know that her left leg is slightly shorter than the right and will be going for another orthopedic surgery very soon for her knee states that her left leg is shorter because of orthopedic surgery of the hip.  Fasting blood sugar this morning 129 last A1c of 7.  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   Positive colorectal cancer screening using Cologuard test 12/19/2020   Unilateral primary osteoarthritis, left knee 11/16/2020   Unilateral primary osteoarthritis, right hip 11/16/2020   Osteoarthritis of hip 07/28/2020   Transaminitis    Erythrocytosis    Uncontrolled type 2 diabetes mellitus with hyperglycemia, with long-term current use of insulin (HCC)    Thalamic stroke ([place]) 07/19/2020   Schizoaffective disorder ([place]) 07/14/2020   Acute ischemic stroke ([place]) 07/13/2020   Dog bite of face 08/13/2018   Dog bite of ear 08/13/2018   Diabetes mellitus without complication ([place])    Mood disorder ([place]) 11/11/2017   Type 2 diabetes mellitus without complication, without long-term current use of insulin ([place]) 11/11/2017   Class 2 obesity with body mass index (BMI) of 37.0 to 37.9 in adult 11/11/2017   Mixed hyperlipidemia 11/11/2017   Tinea corporis 11/08/2017   Elevated liver enzymes 03/12/2015   HTN (hypertension) 03/11/2015   Hypothyroidism 12/14/2011   Current Outpatient Medications on File Prior to Visit  Medication Sig Dispense Refill   acetaminophen (TYLENOL) 500 MG tablet Take 1,000 mg by mouth daily.     aspirin 81 MG chewable tablet Chew 1 tablet (81 mg total) by mouth 2 (two) times daily. 30 tablet 0   atorvastatin (LIPITOR) 80 MG tablet Take 1 tablet (80 mg total) by mouth daily. 30 tablet 0   empagliflozin  (JARDIANCE) 25 MG TABS tablet Take 25 mg by mouth daily.     insulin glargine (LANTUS) 100 UNIT/ML Solostar Pen Inject 21 Units into the skin daily. 15 mL 0   Insulin Pen Needle (ADVOCATE INSULIN PEN NEEDLES) 31G X 5 MM MISC 1 application by Does not apply route daily. 100 each 0   OVER THE COUNTER MEDICATION Take 1 capsule by mouth daily. Pre/Pro biotic One daily     OVER THE COUNTER MEDICATION Take 25-35 mg by mouth at bedtime. THC nightly chewable tablet     No current facility-administered medications on file prior to visit.   Allergies  Allergen Reactions   Dairy Aid [Tilactase] Anaphylaxis and Rash   Metformin And Related     Nausea, diarrhea, malaise   Citric Acid Rash    No results found for this or any previous visit (from the past 2160 hour(s)).  Objective: General: Patient is awake, alert, and oriented x 3 and in no acute distress.  Integument: Skin is warm, dry and supple bilateral.  Nails are well manicured with gel polish on.  No signs of infection. No open lesions or preulcerative lesions present bilateral. Remaining integument unremarkable.  Vasculature:  Dorsalis Pedis pulse 2/4 bilateral. Posterior Tibial pulse  1/4 bilateral. Capillary fill time <3 sec 1-5 bilateral. Positive hair growth to the level of the digits.Temperature gradient within normal limits. No varicosities present bilateral. No edema present bilateral.   Neurology: The patient has intact sensation measured with a 5.07/10g Semmes Weinstein Monofilament  at all pedal sites bilateral . Vibratory sensation intact bilateral with tuning fork.   Musculoskeletal: Asymptomatic bunion pedal deformities noted bilateral. Muscular strength 5/5 in all lower extremity muscular groups bilateral without pain on range of motion.  Assessment and Plan: Problem List Items Addressed This Visit   None Visit Diagnoses     Encounter for comprehensive diabetic foot examination, type 2 diabetes mellitus ([place])    -  Primary        -Examined patient. -Discussed and educated patient on diabetic foot care, especially with  regards to the vascular, neurological and musculoskeletal systems.  -Patient is low risk for any diabetic foot complications patient may return yearly for an exam however if she is interested in getting diabetic orthotics she can return in 3 to 4 months for follow-up evaluation after she has had her orthopedic surgery advised patient depending on how her left lower extremity is after her surgery may need the left buildup at least a quarter of an inch to accommodate for the limb length inequality however we will reevaluate her at a later time for this after her orthopedic surgery -Patient to return  in 3-4 months for follow-up foot exam -Patient advised to call the office if any problems or questions arise in the meantime.  Landis Martins, DPM

## 2021-09-06 ENCOUNTER — Encounter (INDEPENDENT_AMBULATORY_CARE_PROVIDER_SITE_OTHER): Payer: Medicare PPO | Admitting: Ophthalmology

## 2021-09-14 ENCOUNTER — Encounter (INDEPENDENT_AMBULATORY_CARE_PROVIDER_SITE_OTHER): Payer: Self-pay

## 2021-09-21 ENCOUNTER — Ambulatory Visit (INDEPENDENT_AMBULATORY_CARE_PROVIDER_SITE_OTHER): Payer: HMO

## 2021-09-21 ENCOUNTER — Ambulatory Visit: Payer: HMO | Admitting: Orthopaedic Surgery

## 2021-09-21 ENCOUNTER — Encounter: Payer: Self-pay | Admitting: Orthopaedic Surgery

## 2021-09-21 DIAGNOSIS — M25562 Pain in left knee: Secondary | ICD-10-CM | POA: Diagnosis not present

## 2021-09-21 DIAGNOSIS — Z96641 Presence of right artificial hip joint: Secondary | ICD-10-CM | POA: Diagnosis not present

## 2021-09-21 DIAGNOSIS — M1712 Unilateral primary osteoarthritis, left knee: Secondary | ICD-10-CM

## 2021-09-21 MED ORDER — METHYLPREDNISOLONE ACETATE 40 MG/ML IJ SUSP
40.0000 mg | INTRAMUSCULAR | Status: AC | PRN
  Administered 2021-09-21: 40 mg via INTRA_ARTICULAR

## 2021-09-21 MED ORDER — LIDOCAINE HCL 1 % IJ SOLN
3.0000 mL | INTRAMUSCULAR | Status: AC | PRN
  Administered 2021-09-21: 3 mL

## 2021-09-21 NOTE — Progress Notes (Signed)
Office Visit Note   Patient: [name] [name]           Date of Birth: May 08, 1961           MRN: [id number] Visit Date: 09/21/2021              Requested by: Dois Davenport, MD [address] [place],  [place] [address] PCP: Dois Davenport, MD   Assessment & Plan: Visit Diagnoses:  1. Status post total replacement of right hip   2. Left knee pain, unspecified chronicity   3. Unilateral primary osteoarthritis, left knee     Plan: I did recommend a steroid injection in the left knee today and she agreed to this and tolerated it well.  She will watch her blood glucose closely.  I did give her handout to consider hyaluronic acid for the left knee in the future.  All questions and concerns were answered and addressed.  I will see her in follow-up in 4 weeks.  Follow-Up Instructions: Return in about 4 weeks (around 10/19/2021).   Orders:  Orders Placed This Encounter  Procedures   Large Joint Inj   XR HIP UNILAT W OR W/O PELVIS 2-3 VIEWS RIGHT   XR Knee 1-2 Views Left   No orders of the defined types were placed in this encounter.     Procedures: Large Joint Inj: L knee on 09/21/2021 1:50 PM Indications: diagnostic evaluation and pain Details: 22 G 1.5 in needle, superolateral approach  Arthrogram: No  Medications: 3 mL lidocaine 1 %; 40 mg methylPREDNISolone acetate 40 MG/ML Outcome: tolerated well, no immediate complications Procedure, treatment alternatives, risks and benefits explained, specific risks discussed. Consent was given by the patient. Immediately prior to procedure a time out was called to verify the correct patient, procedure, equipment, support staff and site/side marked as required. Patient was prepped and draped in the usual sterile fashion.      Clinical Data: No additional findings.   Subjective: Chief Complaint  Patient presents with   Right Hip - Follow-up  The patient is 8 months out from a right total hip arthroplasty.  She  is doing well from a hip standpoint but her left knee which has severe arthritis and it is really bothering her quite a bit.  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  Again she says that the right hip is doing well but she is wondering about options for her left knee.  She has never had any type of injection in her left knee.  She is a diabetic and does take insulin but is able to watch this closely and is under good control.  She reports that she has significant issues with pain in her knees at night and both her knees hyperextend and quite a bit and this causes a jolt to her and wakes her up.  HPI  Review of Systems There is currently listed no headache, chest pain, shortness of breath, fever, chills, nausea, vomiting  Objective: Vital Signs: There were no vitals taken for this visit.  Physical Exam She is alert and orient x3 and in no acute distress Ortho Exam Examination of her right operative hip shows in the smoothly and fluidly with no pain at all and no problems.  There are no blocks or rotation.  Examination of her left knee shows significant patellofemoral crepitation with varus malalignment  and significant medial joint line tenderness.  Both knees do hyperextend. Specialty Comments:  No specialty comments available.  Imaging: XR HIP UNILAT W OR W/O PELVIS 2-3 VIEWS RIGHT  Result Date: 09/21/2021 An AP pelvis lateral right hip shows a well-seated total hip arthroplasty with no complicating features.  XR Knee 1-2 Views Left  Result Date: 09/21/2021 2 views of the left knee show significant arthritis in the left knee with near loss and bone-on-bone wear in the medial compartment and the patellofemoral joint.  There are osteophytes in all 3 compartments and slight varus malalignment.    PMFS History: Patient Active Problem List   Diagnosis Date Noted   Status post total  replacement of right hip 02/03/2021   Positive colorectal cancer screening using Cologuard test 12/19/2020   Unilateral primary osteoarthritis, left knee 11/16/2020   Unilateral primary osteoarthritis, right hip 11/16/2020   Osteoarthritis of hip 07/28/2020   Transaminitis    Erythrocytosis    Uncontrolled type 2 diabetes mellitus with hyperglycemia, with long-term current use of insulin (HCC)    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   Schizoaffective disorder (HCC) 07/14/2020   Acute ischemic stroke (HCC) 07/13/2020   Dog bite of face 08/13/2018   Dog bite of ear 08/13/2018   Diabetes mellitus without complication (HCC)    Mood disorder (HCC) 11/11/2017   Type 2 diabetes mellitus without complication, without long-term current use of insulin (HCC) 11/11/2017   Class 2 obesity with body mass index (BMI) of 37.0 to 37.9 in adult 11/11/2017   Mixed hyperlipidemia 11/11/2017   Tinea corporis 11/08/2017   Elevated liver enzymes 03/12/2015   HTN (hypertension) 03/11/2015   Hypothyroidism 12/14/2011   Past Medical History:  Diagnosis Date   Anxiety    Chickenpox    Complication of anesthesia    anxiety and panic   Depression    Diabetes mellitus without complication (HCC)    Dog bite of face    Elevated liver enzymes    ABD US done 08/2016: fatty liver   Fatty liver    ABD US done 08/2016: fatty liver   History of kidney stones    Hyperlipidemia    no longer needs meds   Hypertension    pt states no longer needs meds   Hypothyroidism    No longer takes meds   OA (osteoarthritis) of hip    right   Obesity    Polycythemia    Not the vera   Schizophrenia (HCC)    Sleep apnea    uses a CPAP   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   Thyroid disease    no meds    Family History  Problem Relation Age of Onset   Alcohol abuse Mother    Arthritis Mother    Diabetes Mother    Hypertension Mother    Hyperlipidemia Mother    Colon polyps Mother    Other Mother        blood  clotting disorder   Alcohol abuse Father    Arthritis Father    Asthma Father    Heart disease Father    Hypertension Father    Hyperlipidemia Father    Alcohol abuse Brother    Arthritis Brother    Drug abuse Brother     Past Surgical History:  Procedure Laterality Date   BREAST BIOPSY Right    needle bx more than 15 yrs benign, no scar visible    LACERATION REPAIR N/A 12/13/2017   Procedure: Closure of nasal lacerations  with one rotator graft and one advanced flap with skin graft, closure of left nasal laceration, closure of left ear.;  Surgeon: Suzanna Obey, MD;  Location: [facility];  Service: ENT;  Laterality: N/A;   TOTAL HIP ARTHROPLASTY Right 02/03/2021   Procedure: RIGHT TOTAL HIP ARTHROPLASTY ANTERIOR APPROACH;  Surgeon: Kathryne Hitch, MD;  Location: [facility];  Service: Orthopedics;  Laterality: Right;   Social History   Occupational History   Occupation: former Research scientist (life sciences)  Tobacco Use   Smoking status: Former    Types: Cigarettes    Quit date: 2004    Years since quitting: 18.8   Smokeless tobacco: Never  Vaping Use   Vaping Use: Never used  Substance and Sexual Activity   Alcohol use: Not Currently    Alcohol/week: 3.0 standard drinks    Types: 1 Glasses of wine, 1 Cans of beer, 1 Shots of liquor per week   Drug use: No    Comment: THC edible 25 to 50 mg QHS   Sexual activity: Yes    Partners: Male    Birth control/protection: Post-menopausal

## 2021-10-19 ENCOUNTER — Other Ambulatory Visit: Payer: Self-pay

## 2021-10-19 ENCOUNTER — Encounter: Payer: Self-pay | Admitting: Orthopaedic Surgery

## 2021-10-19 ENCOUNTER — Ambulatory Visit (INDEPENDENT_AMBULATORY_CARE_PROVIDER_SITE_OTHER): Payer: HMO | Admitting: Orthopaedic Surgery

## 2021-10-19 DIAGNOSIS — M25562 Pain in left knee: Secondary | ICD-10-CM

## 2021-10-19 DIAGNOSIS — Z96641 Presence of right artificial hip joint: Secondary | ICD-10-CM

## 2021-10-19 DIAGNOSIS — G8929 Other chronic pain: Secondary | ICD-10-CM

## 2021-10-19 NOTE — Progress Notes (Signed)
The patient is now 9 months status post a right total hip arthroplasty.  We actually saw her a month ago and x-rayed the hip and it was great.  She has been having issues with left knee arthritis.  We did place a steroid injection of left knee.  She said the steroid injection was very helpful and she is done well with that.  She is a diabetic and she does state her blood glucose is still been high since that injection.  Her last hemoglobin A1c she said was 7.3.  I did recommend that she see her primary care physician or endocrinologist given the fact that her blood glucose is staying elevated and she has not had any other change and her diet she states.  Her right operative hip moves smoothly and fluidly.  Her left knee seems to moving well.  It does hurt a little bit with rotation.  At this point would only see her back for 3 months.  That will be the 1 year standpoint from the hip replacement.  We can have a standing low AP pelvis at that visit.  If between now and then she continues to have any issues with her left knee, she will call because the next step for her knee would be setting her up for hyaluronic acid injection.  I told her to call to request that injection if the knee is not getting better.

## 2021-11-27 ENCOUNTER — Telehealth: Payer: Self-pay | Admitting: Orthopaedic Surgery

## 2021-11-27 NOTE — Telephone Encounter (Signed)
[name] [name], pt called office, spoke to [name] [name] wanting an appt to get her gel injection. [name] [name] couldn't see anything in chart about approval of gel injection. I let her know I would send a message to you in regards of this. Thank you.   Looks like [name] [name] has sent message to [name] [name] on this matter, will close out.

## 2021-11-27 NOTE — Telephone Encounter (Signed)
Pt called to set an appt for gel injections. No note in file of approval. Please call pt about this matter at [phone].

## 2021-11-28 NOTE — Telephone Encounter (Signed)
Will submit for gel injection  

## 2021-11-28 NOTE — Telephone Encounter (Signed)
Talked with patient concerning gel injection.  

## 2021-11-29 ENCOUNTER — Telehealth: Payer: Self-pay

## 2021-11-29 NOTE — Telephone Encounter (Signed)
VOB submitted for Monovisc, left knee. BV pending.

## 2021-11-30 ENCOUNTER — Encounter: Payer: Self-pay | Admitting: Sports Medicine

## 2021-11-30 ENCOUNTER — Ambulatory Visit: Payer: HMO | Admitting: Sports Medicine

## 2021-11-30 ENCOUNTER — Other Ambulatory Visit: Payer: Self-pay

## 2021-11-30 DIAGNOSIS — M2170 Unequal limb length (acquired), unspecified site: Secondary | ICD-10-CM

## 2021-11-30 DIAGNOSIS — E119 Type 2 diabetes mellitus without complications: Secondary | ICD-10-CM | POA: Diagnosis not present

## 2021-11-30 DIAGNOSIS — M21619 Bunion of unspecified foot: Secondary | ICD-10-CM

## 2021-11-30 NOTE — Progress Notes (Signed)
Subjective: [name] [name] is a 61 y.o. female patient with history of diabetes who presents to office today for diabetic foot exam.  Patient reports that she wants to discuss shoes and possible orthotics that are custom.  States that she has been struggling with limb length difference on her left side due to orthopedic problem and states that her pains are getting better and her joints however she knows that her gait is off and is worried about the long-term effects of this and trying to preserve as much of her knees and hips if possible.  Patient reports blood sugars have been doing good but today was elevated at 181.  Last A1c 1 month ago 7.3  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   Positive colorectal cancer screening using Cologuard test 12/19/2020   Unilateral primary osteoarthritis, left knee 11/16/2020   Unilateral primary osteoarthritis, right hip 11/16/2020   Osteoarthritis of hip 07/28/2020   Transaminitis    Erythrocytosis    Uncontrolled type 2 diabetes mellitus with hyperglycemia, with long-term current use of insulin (HCC)    Thalamic stroke ([place]) 07/19/2020   Schizoaffective disorder ([place]) 07/14/2020   Acute ischemic stroke ([place]) 07/13/2020   Dog bite of face 08/13/2018   Dog bite of ear 08/13/2018   Diabetes mellitus without complication ([place])    Mood disorder ([place]) 11/11/2017   Type 2 diabetes mellitus without complication, without long-term current use of insulin ([place]) 11/11/2017   Class 2 obesity with body mass index (BMI) of 37.0 to 37.9 in adult 11/11/2017   Mixed hyperlipidemia 11/11/2017   Tinea corporis 11/08/2017   Elevated liver enzymes 03/12/2015   HTN (hypertension) 03/11/2015   Hypothyroidism 12/14/2011   Current Outpatient Medications on File Prior to Visit  Medication Sig Dispense Refill   acetaminophen (TYLENOL) 500 MG tablet Take 1,000 mg by mouth daily.     aspirin 81 MG  chewable tablet Chew 1 tablet (81 mg total) by mouth 2 (two) times daily. 30 tablet 0   atorvastatin (LIPITOR) 80 MG tablet Take 1 tablet (80 mg total) by mouth daily. 30 tablet 0   BINAXNOW COVID-19 AG HOME TEST KIT Use as Directed on the Package     empagliflozin (JARDIANCE) 25 MG TABS tablet Take 25 mg by mouth daily.     insulin glargine (LANTUS) 100 UNIT/ML Solostar Pen Inject 21 Units into the skin daily. 15 mL 0   Insulin Pen Needle (ADVOCATE INSULIN PEN NEEDLES) 31G X 5 MM MISC 1 application by Does not apply route daily. 100 each 0   OVER THE COUNTER MEDICATION Take 1 capsule by mouth daily. Pre/Pro biotic One daily     OVER THE COUNTER MEDICATION Take 25-35 mg by mouth at bedtime. THC nightly chewable tablet     No current facility-administered medications on file prior to visit.   Allergies  Allergen Reactions   Dairy Aid [Tilactase] Anaphylaxis and Rash   Metformin And Related     Nausea, diarrhea, malaise   Citric Acid Rash    No results found for this or any previous visit (from the past 2160 hour(s)).  Objective: General: Patient is awake, alert, and oriented x 3 and in no acute distress.  Integument: Skin is warm, dry and supple bilateral.  Nails are well manicured with gel polish on.  No signs of infection. No open lesions or preulcerative lesions present bilateral. Remaining integument unremarkable.  Vasculature:  Dorsalis  Pedis pulse 2/4 bilateral. Posterior Tibial pulse  1/4 bilateral. Capillary fill time <3 sec 1-5 bilateral. Positive hair growth to the level of the digits.Temperature gradient within normal limits. No varicosities present bilateral. No edema present bilateral.   Neurology: The patient has intact sensation measured with a 5.07/10g Semmes Weinstein Monofilament at all pedal sites bilateral . Vibratory sensation intact bilateral with tuning fork.   Musculoskeletal: Asymptomatic bunion pedal deformities noted bilateral.  Shorter left limb secondary to  orthopedic joint problem.  Muscular strength 5/5 in all lower extremity muscular groups bilateral without pain on range of motion.  Assessment and Plan: Problem List Items Addressed This Visit   None Visit Diagnoses     Encounter for comprehensive diabetic foot examination, type 2 diabetes mellitus ([place])    -  Primary   Bunion       Lower limb length difference           -Examined patient. -Discussed and educated patient on diabetic foot care, especially with  regards to the vascular, neurological and musculoskeletal systems.  -Patient is low risk for any diabetic foot complications patient may return yearly for an exam with me however should see our foot orthotist for further evaluation for limb length inequality and diabetic shoes with insoles with buildup on left -Patient to return as scheduled -Patient advised to call the office if any problems or questions arise in the meantime.  Landis Martins, DPM

## 2021-12-06 ENCOUNTER — Telehealth: Payer: Self-pay

## 2021-12-06 NOTE — Telephone Encounter (Signed)
Approved for Monovisc, left knee. [place] Patient will be responsible for 20% OOP. Co-pay of $20.00 No PA required  Appt.12/13/2021 with Dr. Ninfa Linden

## 2021-12-12 ENCOUNTER — Ambulatory Visit: Payer: HMO

## 2021-12-12 ENCOUNTER — Other Ambulatory Visit: Payer: Self-pay

## 2021-12-12 DIAGNOSIS — M21619 Bunion of unspecified foot: Secondary | ICD-10-CM

## 2021-12-12 DIAGNOSIS — E119 Type 2 diabetes mellitus without complications: Secondary | ICD-10-CM

## 2021-12-12 NOTE — Progress Notes (Signed)
SITUATION Reason for Consult: Evaluation for Prefabricated Diabetic Shoes and Bilateral Custom Diabetic Inserts. Patient / Caregiver Report: Patient would like well fitting shoes  OBJECTIVE DATA: Patient History / Diagnosis:    ICD-10-CM   1. Encounter for comprehensive diabetic foot examination, type 2 diabetes mellitus ([place])  E11.9     2. Bunion  M21.619       Current or Previous Devices:   Historical user  In-Person Foot Examination: Ulcers & Callousing:   None and no history  Toe / Foot Deformities:   - Hallux Valgus   Shoe Size: 8.5W  ORTHOTIC RECOMMENDATION Recommended Devices: - 1x pair prefabricated PDAC approved diabetic shoes: Patient selects [id number] 366 8.5W - 3x pair custom-to-patient vacuum formed diabetic insoles. 3/8" lifts on left side  GOALS OF SHOES AND INSOLES - Reduce shear and pressure - Reduce / Prevent callus formation - Reduce / Prevent ulceration - Protect the fragile healing compromised diabetic foot.  Patient would benefit from diabetic shoes and inserts as patient has diabetes mellitus and the patient has one or more of the following conditions: - Peripheral neuropathy with evidence of callus formation - Foot deformity - Poor circulation  ACTIONS PERFORMED Patient was casted for insoles via crush box and measured for shoes via brannock device. Procedure was explained and patient tolerated procedure well. All questions were answered and concerns addressed.  PLAN Patient is to ensure treating physician receives and completes diabetic paperwork. Casts and shoe order are to be held until paperwork is received. Once received patient is to be scheduled for fitting in four weeks.

## 2021-12-13 ENCOUNTER — Ambulatory Visit: Payer: HMO | Admitting: Orthopaedic Surgery

## 2021-12-13 ENCOUNTER — Encounter: Payer: Self-pay | Admitting: Orthopaedic Surgery

## 2021-12-13 DIAGNOSIS — M25562 Pain in left knee: Secondary | ICD-10-CM

## 2021-12-13 DIAGNOSIS — M1712 Unilateral primary osteoarthritis, left knee: Secondary | ICD-10-CM

## 2021-12-13 DIAGNOSIS — G8929 Other chronic pain: Secondary | ICD-10-CM

## 2021-12-13 MED ORDER — HYALURONAN 88 MG/4ML IX SOSY
88.0000 mg | PREFILLED_SYRINGE | INTRA_ARTICULAR | Status: AC | PRN
  Administered 2021-12-13: 88 mg via INTRA_ARTICULAR

## 2021-12-13 NOTE — Progress Notes (Signed)
Procedure Note  Patient: Arbella Krolik Alabi             Date of Birth: Jun 01, 1961           MRN: [id number]             Visit Date: 12/13/2021  Procedures: Visit Diagnoses:  1. Chronic pain of left knee   2. Unilateral primary osteoarthritis, left knee     Large Joint Inj: L knee on 12/13/2021 3:26 PM Indications: diagnostic evaluation and pain Details: 22 G 1.5 in needle, superolateral approach  Arthrogram: No  Medications: 88 mg Hyaluronan 88 MG/4ML Outcome: tolerated well, no immediate complications Procedure, treatment alternatives, risks and benefits explained, specific risks discussed. Consent was given by the patient. Immediately prior to procedure a time out was called to verify the correct patient, procedure, equipment, support staff and site/side marked as required. Patient was prepped and draped in the usual sterile fashion.    Patient is here today for scheduled hyaluronic acid injection with Monovisc to treat the pain from osteoarthritis in her left knee that is been well-documented.  She is not a candidate for steroids either given other comorbidities.  We actually replaced her right hip in March of last year.  That is doing well.  She is recently done a lot of dancing and she hurts on her right side and she will make sure this not her hip.  Her right operative hip moves smoothly and fluidly.  Her pain is over the oblique muscles well proximal to the hip.  Her left knee shows no effusion today.  I did place Monovisc in the knee and this was painful to her so I did place some lidocaine in her knee afterwards.  She tolerated it well.  We will see her back in 3 months and that visit is mainly for her right hip and will have a standing low AP pelvis of her right hip at that visit.

## 2022-01-17 ENCOUNTER — Ambulatory Visit: Payer: HMO | Admitting: Orthopaedic Surgery

## 2022-01-22 ENCOUNTER — Telehealth: Payer: Self-pay

## 2022-01-22 NOTE — Telephone Encounter (Signed)
Casts Sent to [employer] - HOLD for CMN °

## 2022-02-26 ENCOUNTER — Ambulatory Visit (INDEPENDENT_AMBULATORY_CARE_PROVIDER_SITE_OTHER): Payer: PPO | Admitting: Orthopaedic Surgery

## 2022-02-26 ENCOUNTER — Ambulatory Visit (INDEPENDENT_AMBULATORY_CARE_PROVIDER_SITE_OTHER): Payer: HMO

## 2022-02-26 ENCOUNTER — Encounter: Payer: Self-pay | Admitting: Orthopaedic Surgery

## 2022-02-26 DIAGNOSIS — G8929 Other chronic pain: Secondary | ICD-10-CM | POA: Diagnosis not present

## 2022-02-26 DIAGNOSIS — M1712 Unilateral primary osteoarthritis, left knee: Secondary | ICD-10-CM | POA: Diagnosis not present

## 2022-02-26 DIAGNOSIS — Z96641 Presence of right artificial hip joint: Secondary | ICD-10-CM

## 2022-02-26 DIAGNOSIS — M25562 Pain in left knee: Secondary | ICD-10-CM | POA: Diagnosis not present

## 2022-02-26 NOTE — Progress Notes (Signed)
The patient is well-known to me.  She is now over a year out from her right hip replacement.  Said the right hip is doing very well.  She has known tricompartment arthritis is of her left knee.  In February she did have a hyaluronic acid injection.  She is diabetic and we are avoiding steroid injections at this standpoint due to the detrimental effects it is having her blood glucose.  She says her knee is better overall but she may get to the point where she is consider knee replacement surgery.  I feel that if we can get her left knee out of varus with knee replacement surgery and go with a thicker polyethylene insert this would help with her leg length as well. ? ?Her operative hip on the right side moves smoothly and fluidly.  There is no blocks to rotation or complicating features.  Her left knee has medial joint line tenderness as well as lateral tenderness and significant patellofemoral crepitation. ? ?An AP pelvis and lateral of the right hip shows a well-seated total hip arthroplasty with no complicating features.  I did review x-rays of her left knee which show tricompartment arthritis. ? ?She understands that she can get a repeat hyaluronic acid injection as early as August.  If she decides to go that route she will let us know for repeat visit sometime in late July so we can get that ordered for her.  The only other treatment for her left knee would be a knee replacement. ?

## 2022-07-13 ENCOUNTER — Other Ambulatory Visit: Payer: Self-pay | Admitting: Family Medicine

## 2022-07-13 DIAGNOSIS — Z1231 Encounter for screening mammogram for malignant neoplasm of breast: Secondary | ICD-10-CM

## 2022-08-27 ENCOUNTER — Ambulatory Visit
Admission: RE | Admit: 2022-08-27 | Discharge: 2022-08-27 | Disposition: A | Discharge status: Home / Self Care | Payer: HMO | Source: Ambulatory Visit | Attending: Family Medicine | Admitting: Family Medicine

## 2022-08-27 DIAGNOSIS — Z1231 Encounter for screening mammogram for malignant neoplasm of breast: Secondary | ICD-10-CM

## 2022-09-17 ENCOUNTER — Telehealth: Payer: Self-pay | Admitting: Orthopaedic Surgery

## 2022-09-17 NOTE — Telephone Encounter (Signed)
Patient called asked if she can get set up for the gel injection?  The number to contact patient is [phone]

## 2022-09-20 NOTE — Telephone Encounter (Signed)
VOB submitted for Monovisc, left knee 

## 2022-10-09 ENCOUNTER — Telehealth: Payer: Self-pay | Admitting: Orthopaedic Surgery

## 2022-10-09 ENCOUNTER — Other Ambulatory Visit: Payer: Self-pay

## 2022-10-09 DIAGNOSIS — M1712 Unilateral primary osteoarthritis, left knee: Secondary | ICD-10-CM

## 2022-10-09 NOTE — Telephone Encounter (Signed)
Talked with patient and appointment has been scheduled for gel injection.  

## 2022-10-09 NOTE — Telephone Encounter (Signed)
Pt called about an update for approval of gel injection. Please call pt at [phone] with any updates.

## 2022-10-22 ENCOUNTER — Ambulatory Visit: Payer: HMO | Admitting: Orthopaedic Surgery

## 2022-10-22 ENCOUNTER — Encounter: Payer: Self-pay | Admitting: Orthopaedic Surgery

## 2022-10-22 DIAGNOSIS — M1712 Unilateral primary osteoarthritis, left knee: Secondary | ICD-10-CM

## 2022-10-22 MED ORDER — HYALURONAN 88 MG/4ML IX SOSY
88.0000 mg | PREFILLED_SYRINGE | INTRA_ARTICULAR | Status: AC | PRN
  Administered 2022-10-22: 88 mg via INTRA_ARTICULAR

## 2022-10-22 NOTE — Progress Notes (Signed)
Procedure Note  Patient: [name] [name]             Date of Birth: [birth year]           MRN: [id number]             Visit Date: 10/22/2022  Procedures: Visit Diagnoses:  1. Unilateral primary osteoarthritis, left knee     Large Joint Inj: L knee on 10/22/2022 2:47 PM Indications: diagnostic evaluation and pain Details: 22 G 1.5 in needle, superolateral approach  Arthrogram: No  Medications: 88 mg Hyaluronan 88 MG/4ML Outcome: tolerated well, no immediate complications Procedure, treatment alternatives, risks and benefits explained, specific risks discussed. Consent was given by the patient. Immediately prior to procedure a time out was called to verify the correct patient, procedure, equipment, support staff and site/side marked as required. Patient was prepped and draped in the usual sterile fashion.    The patient comes in today for hyaluronic acid injection with Monovisc in her left knee to treat the pain from osteoarthritis.  The last time she had this was about a year and a half ago and it helped quite a bit.  On exam there is no knee joint effusion.  I was able to anesthetize her knee with lidocaine and then place Monovisc in her left knee without difficulty.  She tolerated very well.  She understands that she can have this at the minimum of 6 months apart.  All questions and concerns were answered and addressed.  Follow-up is as needed.  Lot #[id number]

## 2022-12-04 ENCOUNTER — Ambulatory Visit
Admission: RE | Admit: 2022-12-04 | Discharge: 2022-12-04 | Disposition: A | Discharge status: Home / Self Care | Payer: PPO | Source: Ambulatory Visit | Attending: Family Medicine | Admitting: Family Medicine

## 2022-12-04 ENCOUNTER — Other Ambulatory Visit: Payer: Self-pay | Admitting: Family Medicine

## 2022-12-04 DIAGNOSIS — M542 Cervicalgia: Secondary | ICD-10-CM

## 2023-03-25 ENCOUNTER — Ambulatory Visit: Payer: HMO | Admitting: Podiatry

## 2023-03-26 ENCOUNTER — Ambulatory Visit: Payer: HMO | Admitting: Podiatry

## 2023-04-02 ENCOUNTER — Ambulatory Visit (INDEPENDENT_AMBULATORY_CARE_PROVIDER_SITE_OTHER): Payer: PPO | Admitting: Podiatry

## 2023-04-02 DIAGNOSIS — L84 Corns and callosities: Secondary | ICD-10-CM | POA: Diagnosis not present

## 2023-04-02 DIAGNOSIS — E11628 Type 2 diabetes mellitus with other skin complications: Secondary | ICD-10-CM | POA: Diagnosis not present

## 2023-04-02 NOTE — Progress Notes (Signed)
Chief Complaint  Patient presents with   Callouses    Bilateral callus debridement only.     HPI: 62 y.o. female presenting today for diabetic foot check.  She is also interested and diabetic shoes as well as the diabetic inserts.  She had something set up in the past for shoes, but notes that she was never notified that they were in for a fitting.  She would like [place] to look into this and if they are not available she would like to start the diabetic shoe program over again in the near future.  She is requesting her calluses be shaved.  She gets pedicures regularly and does not need nail care today.  She does note that she has a limb length discrepancy and slaps the right foot down onto the floor harder than the left due to the shorter limb  Past Medical History:  Diagnosis Date   Anxiety    Chickenpox    Complication of anesthesia    anxiety and panic   Depression    Diabetes mellitus without complication (HCC)    Dog bite of face    Elevated liver enzymes    ABD US done 08/2016: fatty liver   Fatty liver    ABD US done 08/2016: fatty liver   History of kidney stones    Hyperlipidemia    no longer needs meds   Hypertension    pt states no longer needs meds   Hypothyroidism    No longer takes meds   OA (osteoarthritis) of hip    right   Obesity    Polycythemia    Not the vera   Schizophrenia (HCC)    Sleep apnea    uses a CPAP   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   Thyroid disease    no meds    Past Surgical History:  Procedure Laterality Date   BREAST BIOPSY Right    needle bx more than 15 yrs benign, no scar visible    LACERATION REPAIR N/A 12/13/2017   Procedure: Closure of nasal lacerations with one rotator graft and one advanced flap with skin graft, closure of left nasal laceration, closure of left ear.;  Surgeon: Suzanna Obey, MD;  Location: [facility];  Service: ENT;  Laterality: N/A;   TOTAL HIP ARTHROPLASTY Right 02/03/2021   Procedure: RIGHT TOTAL HIP  ARTHROPLASTY ANTERIOR APPROACH;  Surgeon: Kathryne Hitch, MD;  Location: [facility];  Service: Orthopedics;  Laterality: Right;    Allergies  Allergen Reactions   Dairy Aid [Tilactase] Anaphylaxis and Rash   Metformin And Related     Nausea, diarrhea, malaise   Citric Acid Rash    Physical Exam:  General: The patient is alert and oriented x3 in no acute distress.  Dermatology: Skin is warm, dry and supple bilateral lower extremities. Interspaces are clear of maceration and debris.  Nails are well manicured.  There are hyperkeratotic lesions submet 1 and submet 5 bilateral as well as left submet 2.  No soft tissue breakdown is seen.  No surrounding erythema is noted.  No ulceration is noted.  Vascular: Palpable pedal pulses bilaterally. Capillary refill within normal limits.  No appreciable edema.  No erythema or calor.  Neurological: Light touch sensation grossly intact bilateral feet.  Protective sensation intact with a Semmes Weinstein monofilament bilateral.  Vibratory sensation is diminished bilateral in the forefoot.  Musculoskeletal Exam: Decreased first metatarsophalangeal joint range of motion bilateral.  Assessment/Plan of Care: 1. Type  2 diabetes mellitus with other skin complication, without long-term current use of insulin (HCC)   2. Callus     FOR HOME USE ONLY DME DIABETIC SHOE  Discussed clinical findings with patient today.  The hyperkeratotic lesions were shaved with a sterile #312 blade uneventfully.  She would benefit from diabetic shoes with custom diabetic insoles to offload her pressure areas to decrease callus formation and therefore decrease risk of ulceration.  She does have the earliest signs of peripheral neuropathy due to decreased vibratory sensation in the forefoot.  However her protective sensation is intact.  Reminded patient it is important to try to wear shoes is much as possible to protect the integument.  She had noted she likes to go  barefoot.  Recommended Revitaderm 40 cream for management of her calluses.  Recommend follow-up in approximately 3 to 6 months for diabetic foot exam.  Will set the patient up with the diabetic shoe  & custom diabetic insert consult within the next 1 to 2 weeks in this office.   Clerance Lav, DPM, FACFAS [facility]     [address] [place], [place] [address]                Office [phone]  Fax [phone]

## 2023-05-22 ENCOUNTER — Ambulatory Visit: Payer: PPO | Admitting: Plastic Surgery

## 2023-05-22 ENCOUNTER — Encounter: Payer: Self-pay | Admitting: Plastic Surgery

## 2023-05-22 ENCOUNTER — Telehealth: Payer: Self-pay | Admitting: Plastic Surgery

## 2023-05-22 VITALS — BP 145/78 | HR 72 | Ht 63.0 in | Wt 202.8 lb

## 2023-05-22 DIAGNOSIS — Z794 Long term (current) use of insulin: Secondary | ICD-10-CM | POA: Diagnosis not present

## 2023-05-22 DIAGNOSIS — H02831 Dermatochalasis of right upper eyelid: Secondary | ICD-10-CM

## 2023-05-22 DIAGNOSIS — F259 Schizoaffective disorder, unspecified: Secondary | ICD-10-CM

## 2023-05-22 DIAGNOSIS — H02834 Dermatochalasis of left upper eyelid: Secondary | ICD-10-CM | POA: Diagnosis not present

## 2023-05-22 DIAGNOSIS — I6381 Other cerebral infarction due to occlusion or stenosis of small artery: Secondary | ICD-10-CM

## 2023-05-22 DIAGNOSIS — E1165 Type 2 diabetes mellitus with hyperglycemia: Secondary | ICD-10-CM | POA: Diagnosis not present

## 2023-05-22 DIAGNOSIS — D751 Secondary polycythemia: Secondary | ICD-10-CM

## 2023-05-22 DIAGNOSIS — H02839 Dermatochalasis of unspecified eye, unspecified eyelid: Secondary | ICD-10-CM | POA: Insufficient documentation

## 2023-05-22 NOTE — Progress Notes (Signed)
Subjective:    Patient ID: [name] [name], female    DOB: 1961-06-10, 62 y.o.   MRN: [id number]  The patient is a 62 year old female who was first seen in 2019 after she had a bite from a pit bull.  It involves the nose and left ear.  It was repaired in the ER by Dr. Jearld Fenton.  In November 2019 she underwent operative repair of the left ear deformity and nose scar with tissue advancement.  From the dog bite the patient is looking great.  The issue for today is that in the last several months she has noticed a decrease in her visual field.  It happens worse in the afternoon and evening.  Lifting her brow or tilting her head seems to help.  She is noticing it throughout the day.  It is worse when she is tired.  On exam it does not appear that she has a problem with her muscle so it is dermatochalasis.      Review of Systems  Constitutional: Negative.   HENT: Negative.    Eyes: Negative.   Respiratory: Negative.    Cardiovascular: Negative.   Gastrointestinal: Negative.   Endocrine: Negative.   Genitourinary: Negative.   Musculoskeletal: Negative.        Objective:   Physical Exam Vitals and nursing note reviewed.  Constitutional:      Appearance: Normal appearance.  Cardiovascular:     Rate and Rhythm: Normal rate.     Pulses: Normal pulses.  Pulmonary:     Effort: Pulmonary effort is normal.  Skin:    General: Skin is warm.     Coloration: Skin is not jaundiced.     Findings: No bruising.  Neurological:     Mental Status: She is alert and oriented to person, place, and time.  Psychiatric:        Mood and Affect: Mood normal.        Behavior: Behavior normal.        Thought Content: Thought content normal.        Judgment: Judgment normal.        Assessment & Plan:     ICD-10-CM   1. Erythrocytosis  D75.1     2. Schizoaffective disorder, unspecified type (HCC)  F25.9     3. Uncontrolled type 2 diabetes mellitus with hyperglycemia, with long-term current use of  insulin (HCC)  E11.65    Z79.4     4. Thalamic stroke (HCC)  I63.81     5. Dermatochalasis of both upper eyelids  H02.831    H02.834        We will need a visual field exam.  She is going to talk with her eye doctor and see if she can get it if not she will let me know.  Plan on a televisit in the next 2 weeks to be sure she does not fall through the cracks.  The plan will be for upper lid blepharoplasty for dermatochalasis.  Pictures were obtained of the patient and placed in the chart with the patient's or guardian's permission.

## 2023-05-22 NOTE — Telephone Encounter (Signed)
Patient had an appointment today and said Dr Ulice Bold wanted her to have a visual field exam and said that she sees [facility] in [facility] and they need a referral from [place] before they will schedule. Please advise. Can you please make a referral with order so we can send off

## 2023-05-22 NOTE — Addendum Note (Signed)
Addended by: Zannie Kehr on: 05/22/2023 03:47 PM   Modules accepted: Orders

## 2023-05-23 NOTE — Addendum Note (Signed)
Addended by: Verdie Shire on: 05/23/2023 09:18 AM   Modules accepted: Orders

## 2023-05-28 NOTE — Telephone Encounter (Signed)
[name] [name] got it to go through

## 2023-05-31 ENCOUNTER — Other Ambulatory Visit: Payer: PPO

## 2023-06-04 ENCOUNTER — Encounter: Payer: Self-pay | Admitting: Plastic Surgery

## 2023-06-04 ENCOUNTER — Ambulatory Visit (INDEPENDENT_AMBULATORY_CARE_PROVIDER_SITE_OTHER): Payer: PPO | Admitting: Plastic Surgery

## 2023-06-04 ENCOUNTER — Telehealth: Payer: Self-pay

## 2023-06-04 DIAGNOSIS — H02834 Dermatochalasis of left upper eyelid: Secondary | ICD-10-CM | POA: Diagnosis not present

## 2023-06-04 DIAGNOSIS — H02831 Dermatochalasis of right upper eyelid: Secondary | ICD-10-CM | POA: Diagnosis not present

## 2023-06-04 NOTE — Progress Notes (Signed)
Subjective:    Patient ID: [name] [name], female    DOB: [birth year], 62 y.o.   MRN: [id number]  The patient is a 62 year old joining me by phone for further discussion about her eyes.  Noticed decreased she did get her visual field exam from Dr. Milinda Cave.  I am not able to find this in the chart so we will request a copy of the exam.      The patient is a 62 year old female who was first seen in 2019 after she had a bite from a pit bull.  It involves the nose and left ear.  It was repaired in the ER by Dr. Jearld Fenton.  In November 2019 she underwent operative repair of the left ear deformity and nose scar with tissue advancement.  From the dog bite the patient is looking great.  The issue for today is that in the last several months she has noticed a decrease in her visual field.  It happens worse in the afternoon and evening.  Lifting her brow or tilting her head seems to help.  She is noticing it throughout the day.  It is worse when she is tired.  On exam it does not appear that she has a problem with her muscle so it is dermatochalasis.      Review of Systems  Constitutional: Negative.   Eyes: Negative.   Respiratory: Negative.    Cardiovascular: Negative.   Gastrointestinal: Negative.   Endocrine: Negative.   Genitourinary: Negative.   Musculoskeletal: Negative.        Objective:   Physical Exam        Assessment & Plan:     ICD-10-CM   1. Dermatochalasis of both upper eyelids  H02.831    H02.834        I connected with  [name] [name] on 06/04/23 phone and verified that I am speaking with the correct person using two identifiers. The patient was at home and I was at the office.  We spent 5 min in discussion.    I discussed the limitations of evaluation and management by telemedicine. The patient expressed understanding and agreed to proceed.  Requesting a copy of the exam of the visual field from Dr. Randon Goldsmith office.

## 2023-06-04 NOTE — Telephone Encounter (Signed)
-----   Message from [name] Dillingham sent at 06/04/2023  8:13 AM EDT ----- Can you please get the visual field exam from Dr. Hazle Quant

## 2023-06-04 NOTE — Telephone Encounter (Signed)
Spoke with Marchelle Folks at [facility]. She stated she will fax over the VFT along with the report.

## 2023-07-22 ENCOUNTER — Ambulatory Visit: Payer: PPO | Admitting: Podiatry

## 2023-07-30 ENCOUNTER — Other Ambulatory Visit: Payer: Self-pay | Admitting: Family Medicine

## 2023-07-30 DIAGNOSIS — Z1231 Encounter for screening mammogram for malignant neoplasm of breast: Secondary | ICD-10-CM

## 2023-08-13 ENCOUNTER — Ambulatory Visit (INDEPENDENT_AMBULATORY_CARE_PROVIDER_SITE_OTHER): Payer: PPO | Admitting: Student

## 2023-08-13 VITALS — BP 136/72 | HR 74

## 2023-08-13 DIAGNOSIS — H02834 Dermatochalasis of left upper eyelid: Secondary | ICD-10-CM

## 2023-08-13 DIAGNOSIS — H02831 Dermatochalasis of right upper eyelid: Secondary | ICD-10-CM

## 2023-08-13 MED ORDER — TRAMADOL HCL 50 MG PO TABS
50.0000 mg | ORAL_TABLET | Freq: Three times a day (TID) | ORAL | 0 refills | Status: DC | PRN
Start: 2023-08-13 — End: 2024-01-29

## 2023-08-13 MED ORDER — ONDANSETRON HCL 4 MG PO TABS: 4.0000 mg | ORAL_TABLET | Freq: Three times a day (TID) | ORAL | 0 refills | Status: DC | PRN

## 2023-08-13 MED ORDER — CEPHALEXIN 500 MG PO CAPS: 500.0000 mg | ORAL_CAPSULE | Freq: Four times a day (QID) | ORAL | 0 refills | Status: AC

## 2023-08-13 NOTE — H&P (View-Only) (Signed)
Patient ID: [name] [name], female    DOB: Mar 19, 1961, 62 y.o.   MRN: [id number]  No chief complaint on file.   No diagnosis found.   History of Present Illness: [name] [name] is a 62 y.o.  female  with a history of dermatochalasis.  She presents for preoperative evaluation for upcoming procedure, bilateral blepharoplasty, scheduled for 09/11/2023 with Dr. Ulice Bold.  The patient has not had problems with anesthesia.  Patient denies any history of cardiac disease.  Patient does states she is currently on aspirin 81 mg given history of a stroke in 2021.  She states that she no longer follows up with neurology, and only follows up with her PCP in regards to aspirin management.  Patient states she is not a smoker.  Patient denies taking any birth control or hormone replacement.  She does report history of 1 miscarriage.  She denies any personal family history of blood clots.  She does reports she does have polycythemia.  She states that she has never had to take any anticoagulation postoperatively given this history.  She states that she does not follow-up with hematology for this and reports she only follows up with PCP for this.  Patient denies any recent traumas or surgeries in the past month.  She does state that she has not ear infection which is clearing up.  Patient does report stroke in 2021.  She denies any history of heart attack.  She denies any history of Crohn's disease, ulcerative colitis, COPD, asthma or cancer.  She does report varicosities to her lower extremities.  She denies any recent fevers or chills.  Patient does reports she recently did develop a keloid to a scar on her arm.   Summary of Previous Visit: Patient was seen for initial consult by Dr. Ulice Bold on 05/22/2023.  At this visit, patient complained of decrease in her visual field.  Plan is for patient to undergo visual field exam and follow back up via telephone visit in 2 weeks.  Patient then had  televisit on 06/04/2023.  There was an noticed decrease in her visual field exam.  Plan was to move forward with upper lid blepharoplasty bilaterally.  Job: Does not work at this time  PMH Significant for: History of stroke, hypertension, polycythemia, dermatochalasis  Patient reports she used to have type 2 diabetes, but no longer has it.  She states her most recent A1c was 3 months ago which was 5.4.   Past Medical History: Allergies: Allergies  Allergen Reactions   Dairy Aid [Tilactase] Anaphylaxis and Rash   Metformin And Related     Nausea, diarrhea, malaise   Citric Acid Rash    Current Medications:  Current Outpatient Medications:    acetaminophen (TYLENOL) 500 MG tablet, Take 1,000 mg by mouth daily., Disp: , Rfl:    aspirin 81 MG chewable tablet, Chew 1 tablet (81 mg total) by mouth 2 (two) times daily. (Patient taking differently: Chew 81 mg by mouth daily.), Disp: 30 tablet, Rfl: 0   atorvastatin (LIPITOR) 80 MG tablet, Take 1 tablet (80 mg total) by mouth daily., Disp: 30 tablet, Rfl: 0   empagliflozin (JARDIANCE) 25 MG TABS tablet, Take 25 mg by mouth daily., Disp: , Rfl:    insulin glargine (LANTUS) 100 UNIT/ML Solostar Pen, Inject 21 Units into the skin daily. (Patient taking differently: Inject 8 Units into the skin daily.), Disp: 15 mL, Rfl: 0   Insulin Pen Needle (ADVOCATE INSULIN PEN NEEDLES) 31G X 5  MM MISC, 1 application by Does not apply route daily., Disp: 100 each, Rfl: 0   OVER THE COUNTER MEDICATION, Take 1 capsule by mouth daily. Pre/Pro biotic One daily, Disp: , Rfl:    OVER THE COUNTER MEDICATION, Take 25-35 mg by mouth at bedtime. THC nightly chewable tablet, Disp: , Rfl:   Past Medical Problems: Past Medical History:  Diagnosis Date   Anxiety    Chickenpox    Complication of anesthesia    anxiety and panic   Depression    Diabetes mellitus without complication (HCC)    Dog bite of face    Elevated liver enzymes    ABD US done 08/2016: fatty  liver   Fatty liver    ABD US done 08/2016: fatty liver   History of kidney stones    Hyperlipidemia    no longer needs meds   Hypertension    pt states no longer needs meds   Hypothyroidism    No longer takes meds   OA (osteoarthritis) of hip    right   Obesity    Polycythemia    Not the vera   Schizophrenia (HCC)    Sleep apnea    uses a CPAP   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   Thyroid disease    no meds    Past Surgical History: Past Surgical History:  Procedure Laterality Date   BREAST BIOPSY Right    needle bx more than 15 yrs benign, no scar visible    LACERATION REPAIR N/A 12/13/2017   Procedure: Closure of nasal lacerations with one rotator graft and one advanced flap with skin graft, closure of left nasal laceration, closure of left ear.;  Surgeon: Suzanna Obey, MD;  Location: [facility];  Service: ENT;  Laterality: N/A;   TOTAL HIP ARTHROPLASTY Right 02/03/2021   Procedure: RIGHT TOTAL HIP ARTHROPLASTY ANTERIOR APPROACH;  Surgeon: Kathryne Hitch, MD;  Location: [facility];  Service: Orthopedics;  Laterality: Right;    Social History: Social History   Socioeconomic History   Marital status: Married    Spouse name: Bruce   Number of children: 1   Years of education: Not on file   Highest education level: Not on file  Occupational History   Occupation: former Research scientist (life sciences)  Tobacco Use   Smoking status: Former    Current packs/day: 0.00    Types: Cigarettes    Quit date: 2004    Years since quitting: 20.7   Smokeless tobacco: Never  Vaping Use   Vaping status: Never Used  Substance and Sexual Activity   Alcohol use: Not Currently    Alcohol/week: 3.0 standard drinks of alcohol    Types: 1 Glasses of wine, 1 Cans of beer, 1 Shots of liquor per week   Drug use: No    Comment: THC edible 25 to 50 mg QHS   Sexual activity: Yes    Partners: Male    Birth control/protection: Post-menopausal  Other Topics Concern   Not on file  Social History  Narrative   Not on file   Social Determinants of Health   Financial Resource Strain: Not on file  Food Insecurity: Not on file  Transportation Needs: Not on file  Physical Activity: Not on file  Stress: Not on file  Social Connections: Not on file  Intimate Partner Violence: Not on file    Family History: Family History  Problem Relation Age of Onset   Alcohol abuse Mother    Arthritis Mother  Diabetes Mother    Hypertension Mother    Hyperlipidemia Mother    Colon polyps Mother    Other Mother        blood clotting disorder   Alcohol abuse Father    Arthritis Father    Asthma Father    Heart disease Father    Hypertension Father    Hyperlipidemia Father    Alcohol abuse Brother    Arthritis Brother    Drug abuse Brother     Review of Systems: Denies fevers, chills or changes in health  Physical Exam: Vital Signs There were no vitals taken for this visit.  Physical Exam  Constitutional:      General: Not in acute distress.    Appearance: Normal appearance. Not ill-appearing.  HENT:     Head: Normocephalic and atraumatic.  Neck:     Musculoskeletal: Normal range of motion.  Cardiovascular:     Rate and Rhythm: Normal rate    Pulses: Normal pulses.  Pulmonary:     Effort: Pulmonary effort is normal. No respiratory distress.  Musculoskeletal: Normal range of motion.  Skin:    General: Skin is warm and dry.     Findings: No erythema or rash.  Neurological:     Mental Status: Alert and oriented to person, place, and time. Mental status is at baseline.  Psychiatric:        Mood and Affect: Mood normal.        Behavior: Behavior normal.    Assessment/Plan: The patient is scheduled for blepharoplasty with Dr. Ulice Bold.  Risks, benefits, and alternatives of procedure discussed, questions answered and consent obtained.    Smoking Status: Non-smoker; Counseling Given?  N/A  Caprini Score: 9; Risk Factors include: Age, BMI > 25, history of polycythemia,  varicosities to lower extremities. and length of planned surgery. Recommendation for mechanical and possible prophylaxis. Encourage early ambulation.  Will discuss the possibility of postoperative Lovenox with Dr. Ulice Bold.  Pictures obtained: @consult   Post-op Rx sent to pharmacy:  Tramadol, Zofran, Keflex  Patient taking aspirin 81 mg given history of stroke.  Will send clearance to patient's PCP to hold aspirin 1 week prior to surgery.  Also will request PCP clearance and recommendations given patient's history of polycythemia.   I also instructed to patient to hold her losartan the day of surgery.  Patient was provided with the General Surgical Risk consent document and Pain Medication Agreement prior to their appointment.  They had adequate time to read through the risk consent documents and Pain Medication Agreement. We also discussed them in person together during this preop appointment. All of their questions were answered to their satisfaction.  Recommended calling if they have any further questions.  Risk consent form and Pain Medication Agreement to be scanned into patient's chart.  The consent was obtained with risks and complications reviewed which included bleeding, pain, scar, infection and the risk of anesthesia.  The patients questions were answered to the patients expressed satisfaction.    Electronically signed by: Laurena Spies, PA-C 08/13/2023 8:20 AM

## 2023-08-13 NOTE — Progress Notes (Signed)
Patient ID: [name] [name], female    DOB: Mar 19, 1961, 62 y.o.   MRN: [id number]  No chief complaint on file.   No diagnosis found.   History of Present Illness: [name] [name] is a 62 y.o.  female  with a history of dermatochalasis.  She presents for preoperative evaluation for upcoming procedure, bilateral blepharoplasty, scheduled for 09/11/2023 with Dr. Ulice Bold.  The patient has not had problems with anesthesia.  Patient denies any history of cardiac disease.  Patient does states she is currently on aspirin 81 mg given history of a stroke in 2021.  She states that she no longer follows up with neurology, and only follows up with her PCP in regards to aspirin management.  Patient states she is not a smoker.  Patient denies taking any birth control or hormone replacement.  She does report history of 1 miscarriage.  She denies any personal family history of blood clots.  She does reports she does have polycythemia.  She states that she has never had to take any anticoagulation postoperatively given this history.  She states that she does not follow-up with hematology for this and reports she only follows up with PCP for this.  Patient denies any recent traumas or surgeries in the past month.  She does state that she has not ear infection which is clearing up.  Patient does report stroke in 2021.  She denies any history of heart attack.  She denies any history of Crohn's disease, ulcerative colitis, COPD, asthma or cancer.  She does report varicosities to her lower extremities.  She denies any recent fevers or chills.  Patient does reports she recently did develop a keloid to a scar on her arm.   Summary of Previous Visit: Patient was seen for initial consult by Dr. Ulice Bold on 05/22/2023.  At this visit, patient complained of decrease in her visual field.  Plan is for patient to undergo visual field exam and follow back up via telephone visit in 2 weeks.  Patient then had  televisit on 06/04/2023.  There was an noticed decrease in her visual field exam.  Plan was to move forward with upper lid blepharoplasty bilaterally.  Job: Does not work at this time  PMH Significant for: History of stroke, hypertension, polycythemia, dermatochalasis  Patient reports she used to have type 2 diabetes, but no longer has it.  She states her most recent A1c was 3 months ago which was 5.4.   Past Medical History: Allergies: Allergies  Allergen Reactions   Dairy Aid [Tilactase] Anaphylaxis and Rash   Metformin And Related     Nausea, diarrhea, malaise   Citric Acid Rash    Current Medications:  Current Outpatient Medications:    acetaminophen (TYLENOL) 500 MG tablet, Take 1,000 mg by mouth daily., Disp: , Rfl:    aspirin 81 MG chewable tablet, Chew 1 tablet (81 mg total) by mouth 2 (two) times daily. (Patient taking differently: Chew 81 mg by mouth daily.), Disp: 30 tablet, Rfl: 0   atorvastatin (LIPITOR) 80 MG tablet, Take 1 tablet (80 mg total) by mouth daily., Disp: 30 tablet, Rfl: 0   empagliflozin (JARDIANCE) 25 MG TABS tablet, Take 25 mg by mouth daily., Disp: , Rfl:    insulin glargine (LANTUS) 100 UNIT/ML Solostar Pen, Inject 21 Units into the skin daily. (Patient taking differently: Inject 8 Units into the skin daily.), Disp: 15 mL, Rfl: 0   Insulin Pen Needle (ADVOCATE INSULIN PEN NEEDLES) 31G X 5  MM MISC, 1 application by Does not apply route daily., Disp: 100 each, Rfl: 0   OVER THE COUNTER MEDICATION, Take 1 capsule by mouth daily. Pre/Pro biotic One daily, Disp: , Rfl:    OVER THE COUNTER MEDICATION, Take 25-35 mg by mouth at bedtime. THC nightly chewable tablet, Disp: , Rfl:   Past Medical Problems: Past Medical History:  Diagnosis Date   Anxiety    Chickenpox    Complication of anesthesia    anxiety and panic   Depression    Diabetes mellitus without complication (HCC)    Dog bite of face    Elevated liver enzymes    ABD US done 08/2016: fatty  liver   Fatty liver    ABD US done 08/2016: fatty liver   History of kidney stones    Hyperlipidemia    no longer needs meds   Hypertension    pt states no longer needs meds   Hypothyroidism    No longer takes meds   OA (osteoarthritis) of hip    right   Obesity    Polycythemia    Not the vera   Schizophrenia (HCC)    Sleep apnea    uses a CPAP   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   Thyroid disease    no meds    Past Surgical History: Past Surgical History:  Procedure Laterality Date   BREAST BIOPSY Right    needle bx more than 15 yrs benign, no scar visible    LACERATION REPAIR N/A 12/13/2017   Procedure: Closure of nasal lacerations with one rotator graft and one advanced flap with skin graft, closure of left nasal laceration, closure of left ear.;  Surgeon: Suzanna Obey, MD;  Location: [facility];  Service: ENT;  Laterality: N/A;   TOTAL HIP ARTHROPLASTY Right 02/03/2021   Procedure: RIGHT TOTAL HIP ARTHROPLASTY ANTERIOR APPROACH;  Surgeon: Kathryne Hitch, MD;  Location: [facility];  Service: Orthopedics;  Laterality: Right;    Social History: Social History   Socioeconomic History   Marital status: Married    Spouse name: Bruce   Number of children: 1   Years of education: Not on file   Highest education level: Not on file  Occupational History   Occupation: former Research scientist (life sciences)  Tobacco Use   Smoking status: Former    Current packs/day: 0.00    Types: Cigarettes    Quit date: 2004    Years since quitting: 20.7   Smokeless tobacco: Never  Vaping Use   Vaping status: Never Used  Substance and Sexual Activity   Alcohol use: Not Currently    Alcohol/week: 3.0 standard drinks of alcohol    Types: 1 Glasses of wine, 1 Cans of beer, 1 Shots of liquor per week   Drug use: No    Comment: THC edible 25 to 50 mg QHS   Sexual activity: Yes    Partners: Male    Birth control/protection: Post-menopausal  Other Topics Concern   Not on file  Social History  Narrative   Not on file   Social Determinants of Health   Financial Resource Strain: Not on file  Food Insecurity: Not on file  Transportation Needs: Not on file  Physical Activity: Not on file  Stress: Not on file  Social Connections: Not on file  Intimate Partner Violence: Not on file    Family History: Family History  Problem Relation Age of Onset   Alcohol abuse Mother    Arthritis Mother  Diabetes Mother    Hypertension Mother    Hyperlipidemia Mother    Colon polyps Mother    Other Mother        blood clotting disorder   Alcohol abuse Father    Arthritis Father    Asthma Father    Heart disease Father    Hypertension Father    Hyperlipidemia Father    Alcohol abuse Brother    Arthritis Brother    Drug abuse Brother     Review of Systems: Denies fevers, chills or changes in health  Physical Exam: Vital Signs There were no vitals taken for this visit.  Physical Exam  Constitutional:      General: Not in acute distress.    Appearance: Normal appearance. Not ill-appearing.  HENT:     Head: Normocephalic and atraumatic.  Neck:     Musculoskeletal: Normal range of motion.  Cardiovascular:     Rate and Rhythm: Normal rate    Pulses: Normal pulses.  Pulmonary:     Effort: Pulmonary effort is normal. No respiratory distress.  Musculoskeletal: Normal range of motion.  Skin:    General: Skin is warm and dry.     Findings: No erythema or rash.  Neurological:     Mental Status: Alert and oriented to person, place, and time. Mental status is at baseline.  Psychiatric:        Mood and Affect: Mood normal.        Behavior: Behavior normal.    Assessment/Plan: The patient is scheduled for blepharoplasty with Dr. Ulice Bold.  Risks, benefits, and alternatives of procedure discussed, questions answered and consent obtained.    Smoking Status: Non-smoker; Counseling Given?  N/A  Caprini Score: 9; Risk Factors include: Age, BMI > 25, history of polycythemia,  varicosities to lower extremities. and length of planned surgery. Recommendation for mechanical and possible prophylaxis. Encourage early ambulation.  Will discuss the possibility of postoperative Lovenox with Dr. Ulice Bold.  Pictures obtained: @consult   Post-op Rx sent to pharmacy:  Tramadol, Zofran, Keflex  Patient taking aspirin 81 mg given history of stroke.  Will send clearance to patient's PCP to hold aspirin 1 week prior to surgery.  Also will request PCP clearance and recommendations given patient's history of polycythemia.   I also instructed to patient to hold her losartan the day of surgery.  Patient was provided with the General Surgical Risk consent document and Pain Medication Agreement prior to their appointment.  They had adequate time to read through the risk consent documents and Pain Medication Agreement. We also discussed them in person together during this preop appointment. All of their questions were answered to their satisfaction.  Recommended calling if they have any further questions.  Risk consent form and Pain Medication Agreement to be scanned into patient's chart.  The consent was obtained with risks and complications reviewed which included bleeding, pain, scar, infection and the risk of anesthesia.  The patients questions were answered to the patients expressed satisfaction.    Electronically signed by: Laurena Spies, PA-C 08/13/2023 8:20 AM

## 2023-08-28 DIAGNOSIS — Z1231 Encounter for screening mammogram for malignant neoplasm of breast: Secondary | ICD-10-CM

## 2023-08-29 ENCOUNTER — Ambulatory Visit
Admission: RE | Admit: 2023-08-29 | Discharge: 2023-08-29 | Disposition: A | Discharge status: Home / Self Care | Payer: PPO | Source: Ambulatory Visit | Attending: Family Medicine | Admitting: Family Medicine

## 2023-08-29 DIAGNOSIS — Z1231 Encounter for screening mammogram for malignant neoplasm of breast: Secondary | ICD-10-CM

## 2023-09-02 ENCOUNTER — Encounter: Payer: Self-pay | Admitting: Student

## 2023-09-02 NOTE — H&P (View-Only) (Signed)
Called patient's PCP office about clearance to hold aspirin as we have not received it yet..  The office did not answer and I left a voicemail to return call.  Left both the clinic number and my cell phone number.

## 2023-09-02 NOTE — Progress Notes (Signed)
Called patient's PCP office about clearance to hold aspirin as we have not received it yet..  The office did not answer and I left a voicemail to return call.  Left both the clinic number and my cell phone number.

## 2023-09-03 ENCOUNTER — Other Ambulatory Visit: Payer: Self-pay

## 2023-09-03 ENCOUNTER — Encounter (HOSPITAL_BASED_OUTPATIENT_CLINIC_OR_DEPARTMENT_OTHER): Payer: Self-pay | Admitting: Plastic Surgery

## 2023-09-09 ENCOUNTER — Encounter (HOSPITAL_BASED_OUTPATIENT_CLINIC_OR_DEPARTMENT_OTHER)
Admission: RE | Admit: 2023-09-09 | Discharge: 2023-09-09 | Disposition: A | Discharge status: Home / Self Care | Payer: PPO | Source: Ambulatory Visit | Attending: Plastic Surgery

## 2023-09-09 DIAGNOSIS — H0231 Blepharochalasis right upper eyelid: Secondary | ICD-10-CM | POA: Diagnosis not present

## 2023-09-09 DIAGNOSIS — Z01818 Encounter for other preprocedural examination: Secondary | ICD-10-CM | POA: Insufficient documentation

## 2023-09-09 DIAGNOSIS — I10 Essential (primary) hypertension: Secondary | ICD-10-CM | POA: Diagnosis not present

## 2023-09-09 DIAGNOSIS — Z01812 Encounter for preprocedural laboratory examination: Secondary | ICD-10-CM | POA: Diagnosis present

## 2023-09-09 DIAGNOSIS — Z01810 Encounter for preprocedural cardiovascular examination: Secondary | ICD-10-CM | POA: Diagnosis present

## 2023-09-09 DIAGNOSIS — R9431 Abnormal electrocardiogram [ECG] [EKG]: Secondary | ICD-10-CM | POA: Insufficient documentation

## 2023-09-09 DIAGNOSIS — H0234 Blepharochalasis left upper eyelid: Secondary | ICD-10-CM | POA: Diagnosis not present

## 2023-09-09 HISTORY — PX: OTHER SURGICAL HISTORY: SHX169

## 2023-09-11 ENCOUNTER — Ambulatory Visit (HOSPITAL_BASED_OUTPATIENT_CLINIC_OR_DEPARTMENT_OTHER)
Admission: RE | Admit: 2023-09-11 | Discharge: 2023-09-11 | Disposition: A | Discharge status: Home / Self Care | Payer: PPO | Attending: Plastic Surgery | Admitting: Plastic Surgery

## 2023-09-11 ENCOUNTER — Encounter (HOSPITAL_BASED_OUTPATIENT_CLINIC_OR_DEPARTMENT_OTHER)
Admission: RE | Disposition: A | Discharge status: Home / Self Care | Payer: Self-pay | Source: Home / Self Care | Attending: Plastic Surgery

## 2023-09-11 ENCOUNTER — Encounter (HOSPITAL_BASED_OUTPATIENT_CLINIC_OR_DEPARTMENT_OTHER): Payer: Self-pay | Admitting: Plastic Surgery

## 2023-09-11 ENCOUNTER — Ambulatory Visit (HOSPITAL_BASED_OUTPATIENT_CLINIC_OR_DEPARTMENT_OTHER): Payer: PPO | Admitting: Anesthesiology

## 2023-09-11 DIAGNOSIS — H02834 Dermatochalasis of left upper eyelid: Secondary | ICD-10-CM

## 2023-09-11 DIAGNOSIS — H0231 Blepharochalasis right upper eyelid: Secondary | ICD-10-CM | POA: Insufficient documentation

## 2023-09-11 DIAGNOSIS — H0234 Blepharochalasis left upper eyelid: Secondary | ICD-10-CM | POA: Insufficient documentation

## 2023-09-11 DIAGNOSIS — H02831 Dermatochalasis of right upper eyelid: Secondary | ICD-10-CM

## 2023-09-11 DIAGNOSIS — I10 Essential (primary) hypertension: Secondary | ICD-10-CM | POA: Diagnosis not present

## 2023-09-11 HISTORY — PX: BROW LIFT: SHX178

## 2023-09-11 LAB — GLUCOSE, CAPILLARY: Glucose-Capillary: 129 mg/dL — ABNORMAL HIGH (ref 70–99)

## 2023-09-11 SURGERY — BLEPHAROPLASTY
Anesthesia: General | Site: Eye | Laterality: Bilateral

## 2023-09-11 MED ORDER — ATROPINE SULFATE 0.4 MG/ML IV SOLN
INTRAVENOUS | Status: AC
  Filled 2023-09-11: qty 1

## 2023-09-11 MED ORDER — BSS IO SOLN
INTRAOCULAR | Status: DC | PRN
  Administered 2023-09-11: 15 mL

## 2023-09-11 MED ORDER — TOBRAMYCIN 0.3 % OP OINT
TOPICAL_OINTMENT | OPHTHALMIC | Status: DC | PRN
  Administered 2023-09-11: 1

## 2023-09-11 MED ORDER — EPHEDRINE 5 MG/ML INJ
INTRAVENOUS | Status: AC
  Filled 2023-09-11: qty 5

## 2023-09-11 MED ORDER — CEFAZOLIN SODIUM-DEXTROSE 2-4 GM/100ML-% IV SOLN
2.0000 g | INTRAVENOUS | Status: AC
  Administered 2023-09-11: 2 g via INTRAVENOUS

## 2023-09-11 MED ORDER — DEXMEDETOMIDINE HCL IN NACL 80 MCG/20ML IV SOLN
INTRAVENOUS | Status: AC
  Filled 2023-09-11: qty 20

## 2023-09-11 MED ORDER — POVIDONE-IODINE 5 % EX SOLN
CUTANEOUS | Status: DC | PRN
  Administered 2023-09-11: 1 via TOPICAL

## 2023-09-11 MED ORDER — PHENYLEPHRINE 80 MCG/ML (10ML) SYRINGE FOR IV PUSH (FOR BLOOD PRESSURE SUPPORT)
PREFILLED_SYRINGE | INTRAVENOUS | Status: AC
  Filled 2023-09-11: qty 10

## 2023-09-11 MED ORDER — FENTANYL CITRATE (PF) 100 MCG/2ML IJ SOLN
INTRAMUSCULAR | Status: DC | PRN
  Administered 2023-09-11: 100 ug via INTRAVENOUS

## 2023-09-11 MED ORDER — FENTANYL CITRATE (PF) 100 MCG/2ML IJ SOLN
INTRAMUSCULAR | Status: AC
  Filled 2023-09-11: qty 2

## 2023-09-11 MED ORDER — SUCCINYLCHOLINE CHLORIDE 200 MG/10ML IV SOSY
PREFILLED_SYRINGE | INTRAVENOUS | Status: AC
  Filled 2023-09-11: qty 10

## 2023-09-11 MED ORDER — LIDOCAINE HCL (CARDIAC) PF 100 MG/5ML IV SOSY
PREFILLED_SYRINGE | INTRAVENOUS | Status: DC | PRN
  Administered 2023-09-11: 60 mg via INTRAVENOUS

## 2023-09-11 MED ORDER — ACETAMINOPHEN 500 MG PO TABS
1000.0000 mg | ORAL_TABLET | Freq: Once | ORAL | Status: AC
  Administered 2023-09-11: 1000 mg via ORAL

## 2023-09-11 MED ORDER — POVIDONE-IODINE 5 % OP SOLN
OPHTHALMIC | Status: AC
  Filled 2023-09-11: qty 30

## 2023-09-11 MED ORDER — FENTANYL CITRATE (PF) 100 MCG/2ML IJ SOLN: 25.0000 ug | INTRAMUSCULAR | Status: DC | PRN

## 2023-09-11 MED ORDER — LIDOCAINE-EPINEPHRINE 1 %-1:100000 IJ SOLN
INTRAMUSCULAR | Status: DC | PRN
  Administered 2023-09-11: 2 mL

## 2023-09-11 MED ORDER — MIDAZOLAM HCL 2 MG/2ML IJ SOLN
INTRAMUSCULAR | Status: AC
  Filled 2023-09-11: qty 2

## 2023-09-11 MED ORDER — ONDANSETRON HCL 4 MG/2ML IJ SOLN
INTRAMUSCULAR | Status: DC | PRN
  Administered 2023-09-11: 4 mg via INTRAVENOUS

## 2023-09-11 MED ORDER — PROPOFOL 10 MG/ML IV BOLUS
INTRAVENOUS | Status: AC
  Filled 2023-09-11: qty 20

## 2023-09-11 MED ORDER — OXYCODONE HCL 5 MG/5ML PO SOLN: 5.0000 mg | Freq: Once | ORAL | Status: DC | PRN

## 2023-09-11 MED ORDER — ONDANSETRON HCL 4 MG/2ML IJ SOLN
INTRAMUSCULAR | Status: AC
  Filled 2023-09-11: qty 2

## 2023-09-11 MED ORDER — DEXAMETHASONE SODIUM PHOSPHATE 10 MG/ML IJ SOLN
INTRAMUSCULAR | Status: AC
Start: 2023-09-11 — End: ?
  Filled 2023-09-11: qty 1

## 2023-09-11 MED ORDER — PROPOFOL 10 MG/ML IV BOLUS
INTRAVENOUS | Status: DC | PRN
  Administered 2023-09-11: 150 mg via INTRAVENOUS
  Administered 2023-09-11: 50 mg via INTRAVENOUS

## 2023-09-11 MED ORDER — LACTATED RINGERS IV SOLN: INTRAVENOUS | Status: DC

## 2023-09-11 MED ORDER — CHLORHEXIDINE GLUCONATE CLOTH 2 % EX PADS: 6.0000 | MEDICATED_PAD | Freq: Once | CUTANEOUS | Status: DC

## 2023-09-11 MED ORDER — ONDANSETRON HCL 4 MG/2ML IJ SOLN: 4.0000 mg | Freq: Once | INTRAMUSCULAR | Status: DC | PRN

## 2023-09-11 MED ORDER — ACETAMINOPHEN 500 MG PO TABS
ORAL_TABLET | ORAL | Status: AC
  Filled 2023-09-11: qty 2

## 2023-09-11 MED ORDER — LIDOCAINE-EPINEPHRINE 1 %-1:100000 IJ SOLN
INTRAMUSCULAR | Status: AC
  Filled 2023-09-11: qty 1

## 2023-09-11 MED ORDER — MIDAZOLAM HCL 5 MG/5ML IJ SOLN
INTRAMUSCULAR | Status: DC | PRN
  Administered 2023-09-11: 2 mg via INTRAVENOUS

## 2023-09-11 MED ORDER — DEXAMETHASONE SODIUM PHOSPHATE 4 MG/ML IJ SOLN
INTRAMUSCULAR | Status: DC | PRN
  Administered 2023-09-11: 5 mg via INTRAVENOUS

## 2023-09-11 MED ORDER — AMISULPRIDE (ANTIEMETIC) 5 MG/2ML IV SOLN: 10.0000 mg | Freq: Once | INTRAVENOUS | Status: DC | PRN

## 2023-09-11 MED ORDER — LIDOCAINE 2% (20 MG/ML) 5 ML SYRINGE
INTRAMUSCULAR | Status: AC
  Filled 2023-09-11: qty 5

## 2023-09-11 MED ORDER — CEFAZOLIN SODIUM-DEXTROSE 2-4 GM/100ML-% IV SOLN
INTRAVENOUS | Status: AC
  Filled 2023-09-11: qty 100

## 2023-09-11 MED ORDER — OXYCODONE HCL 5 MG PO TABS: 5.0000 mg | ORAL_TABLET | Freq: Once | ORAL | Status: DC | PRN

## 2023-09-11 SURGICAL SUPPLY — 32 items
APL SRG 3 HI ABS STRL LF PLS (MISCELLANEOUS) ×1
APL SWBSTK 6 STRL LF DISP (MISCELLANEOUS) ×1
APPLICATOR COTTON TIP 6 STRL (MISCELLANEOUS) ×3 IMPLANT
APPLICATOR COTTON TIP 6IN STRL (MISCELLANEOUS) ×1
APPLICATOR DR MATTHEWS STRL (MISCELLANEOUS) IMPLANT
BLADE SURG 15 STRL LF DISP TIS (BLADE) ×2 IMPLANT
BLADE SURG 15 STRL SS (BLADE) ×1
BNDG EYE OVAL 2 1/8 X 2 5/8 (GAUZE/BANDAGES/DRESSINGS) ×2 IMPLANT
CORD BIPOLAR FORCEPS 12FT (ELECTRODE) ×1 IMPLANT
COVER BACK TABLE 60X90IN (DRAPES) ×1 IMPLANT
COVER MAYO STAND STRL (DRAPES) ×1 IMPLANT
DRAPE U-SHAPE 76X120 STRL (DRAPES) ×1 IMPLANT
ELECT NDL BLADE 2-5/6 (NEEDLE) ×1 IMPLANT
ELECT NEEDLE BLADE 2-5/6 (NEEDLE)
ELECT REM PT RETURN 9FT ADLT (ELECTROSURGICAL)
ELECTRODE REM PT RTRN 9FT ADLT (ELECTROSURGICAL) ×1 IMPLANT
GLOVE BIO SURGEON STRL SZ 6.5 (GLOVE) ×2 IMPLANT
GOWN STRL REUS W/ TWL LRG LVL3 (GOWN DISPOSABLE) ×2 IMPLANT
GOWN STRL REUS W/TWL LRG LVL3 (GOWN DISPOSABLE) ×3
NDL HYPO 30GX1 BEV (NEEDLE) ×1 IMPLANT
NEEDLE HYPO 30GX1 BEV (NEEDLE) ×1
PACK BASIN DAY SURGERY FS (CUSTOM PROCEDURE TRAY) ×1 IMPLANT
PENCIL SMOKE EVACUATOR (MISCELLANEOUS) ×1 IMPLANT
SLEEVE SCD COMPRESS KNEE MED (STOCKING) ×1 IMPLANT
SPIKE FLUID TRANSFER (MISCELLANEOUS) IMPLANT
STRIP CLOSURE SKIN 1/2X4 (GAUZE/BANDAGES/DRESSINGS) ×1 IMPLANT
STRIP SUTURE WOUND CLOSURE 1/2 (MISCELLANEOUS) IMPLANT
SUT CHROMIC 6 0 PS 4 (SUTURE) IMPLANT
SUT MNCRL 6-0 UNDY P1 1X18 (SUTURE) IMPLANT
SYR CONTROL 10ML LL (SYRINGE) ×1 IMPLANT
TOWEL GREEN STERILE FF (TOWEL DISPOSABLE) ×2 IMPLANT
TRAY DSU PREP LF (CUSTOM PROCEDURE TRAY) ×1 IMPLANT

## 2023-09-11 NOTE — Anesthesia Preprocedure Evaluation (Addendum)
Anesthesia Evaluation  Patient identified by MRN, date of birth, ID band Patient awake    Reviewed: Allergy & Precautions, NPO status , Patient's Chart, lab work & pertinent test results  History of Anesthesia Complications Negative for: history of anesthetic complications  Airway Mallampati: II  TM Distance: >3 FB Neck ROM: Full    Dental  (+) Dental Advisory Given   Pulmonary sleep apnea and Continuous Positive Airway Pressure Ventilation , former smoker   Pulmonary exam normal        Cardiovascular hypertension, Pt. on medications Normal cardiovascular exam     Neuro/Psych  PSYCHIATRIC DISORDERS Anxiety Depression     Schizoaffective d/o CVA, No Residual Symptoms    GI/Hepatic negative GI ROS, Neg liver ROS,,,  Endo/Other  diabetes, Well ControlledHypothyroidism   Obesity   Renal/GU negative Renal ROS     Musculoskeletal  (+) Arthritis ,    Abdominal   Peds  Hematology negative hematology ROS (+)   Anesthesia Other Findings   Reproductive/Obstetrics                             Anesthesia Physical Anesthesia Plan  ASA: 3  Anesthesia Plan: General   Post-op Pain Management: Tylenol PO (pre-op)*   Induction: Intravenous  PONV Risk Score and Plan: 3 and Treatment may vary due to age or medical condition, Ondansetron and Propofol infusion  Airway Management Planned: LMA  Additional Equipment: None  Intra-op Plan:   Post-operative Plan: Extubation in OR  Informed Consent: I have reviewed the patients History and Physical, chart, labs and discussed the procedure including the risks, benefits and alternatives for the proposed anesthesia with the patient or authorized representative who has indicated his/her understanding and acceptance.     Dental advisory given  Plan Discussed with: CRNA and Anesthesiologist  Anesthesia Plan Comments:        Anesthesia Quick  Evaluation

## 2023-09-11 NOTE — Discharge Instructions (Addendum)
Keep head elevated as able. Ice for next 24 hours as able.   Post Anesthesia Home Care Instructions  Activity: Get plenty of rest for the remainder of the day. A responsible individual must stay with you for 24 hours following the procedure.  For the next 24 hours, DO NOT: -Drive a car -Advertising copywriter -Drink alcoholic beverages -Take any medication unless instructed by your physician -Make any legal decisions or sign important papers.  Meals: Start with liquid foods such as gelatin or soup. Progress to regular foods as tolerated. Avoid greasy, spicy, heavy foods. If nausea and/or vomiting occur, drink only clear liquids until the nausea and/or vomiting subsides. Call your physician if vomiting continues.  Special Instructions/Symptoms: Your throat may feel dry or sore from the anesthesia or the breathing tube placed in your throat during surgery. If this causes discomfort, gargle with warm salt water. The discomfort should disappear within 24 hours.  If you had a scopolamine patch placed behind your ear for the management of post- operative nausea and/or vomiting:  1. The medication in the patch is effective for 72 hours, after which it should be removed.  Wrap patch in a tissue and discard in the trash. Wash hands thoroughly with soap and water. 2. You may remove the patch earlier than 72 hours if you experience unpleasant side effects which may include dry mouth, dizziness or visual disturbances. 3. Avoid touching the patch. Wash your hands with soap and water after contact with the patch.     No tylenol until 2:40pm.

## 2023-09-11 NOTE — Progress Notes (Signed)
   09/11/23 0830  [place] Suicide Severity Rating Scale  1. In the past month - "Have you wished you were dead or wished you could go to sleep and not wake up?" Yes  2. In the past month - "Have you actually had any thoughts of killing yourself?" Yes  3. In the past month - "Have you been thinking about how you might kill yourself?" Yes  4. In the past month - "Have you had these thoughts and had some intention of acting on them?" No  5. In the past month - "Have you started to work out or worked out the details of how to kill yourself? Do you intend to carry out this plan?" Yes  6. Have you ever done anything, started to do anything, or prepared to do anything to end your life?" No  C-SSRS RISK CATEGORY High Risk  Patient location: Clinic/Outpatient Department   Patient struggles with mental illness and states she has these thoughts "regularly". She has coping mechanisms to overcome and control these thoughts. She states today she does not have any of these suicidal thoughts but in the past month has had these thoughts.

## 2023-09-11 NOTE — Anesthesia Postprocedure Evaluation (Signed)
Anesthesia Post Note  Patient: [name] [name]  Procedure(s) Performed: BLEPHAROPLASTY (Bilateral: Eye)     Patient location during evaluation: PACU Anesthesia Type: General Level of consciousness: awake and alert Pain management: pain level controlled Vital Signs Assessment: post-procedure vital signs reviewed and stable Respiratory status: spontaneous breathing, nonlabored ventilation and respiratory function stable Cardiovascular status: stable and blood pressure returned to baseline Anesthetic complications: no   No notable events documented.  Last Vitals:  Vitals:   09/11/23 1129 09/11/23 1204  BP: 119/65 (!) 143/78  Pulse: 74 72  Resp: 10 16  Temp:  36.8 C  SpO2: 95% 97%    Last Pain:  Vitals:   09/11/23 1204  TempSrc:   PainSc: 4                  Beryle Lathe

## 2023-09-11 NOTE — Op Note (Signed)
Operative Note  Date of operation: 09/11/2023  Patient: [name] [name], MRN: [id number], 62 y.o. female.  Date of birth:  March 05, 1961  Location: [facility]  Preoperative Diagnosis: Redundant Upper Eyelid skin  Postoperative Diagnosis: Same  Procedure: Bilateral Upper eyelid blepharoplasty  Surgeon: Foster Simpson, DO  Assistant: Caroline More, PA  Complications: None   EBL: Minimal  Disposition: Recovery Room  Indications: To prevent complications and improve visual field function.  Improve quality of life.  The patient presented with concerns regarding heavy upper eyelids with a decrease visual field.  This made it difficult with activities and reading.  It was better with manual elevation of the upper eyelids or extending the neck.  The clinical exam and tests confirmed the need for bilateral upper eyelid blepharoplasty.  Surgical risks and benefits were discussed in detail and are in the chart.  Procedure in detail: Patient was seen on the morning of the procedure after anesthesia evaluation. The patient was marked in holding in the sitting and reclining position. All questions were answered.  The patient was then taken to the operating room.  Anesthesia was administered and a time out was called with all information confirmed to be correct.  The patient was prepped and draped with a opthalmologic betadine. The markings were confirmed.  Local anesthetic was injected into the upper lids. The local was given several minutes to take effect.  Attention was directed to the right upper lid.  The redundant upper eyelid was measured with 20 mm of upper eyelid skin to remain.  A # 15 blade was used to incise the upper eyelid skin.  Hemostasis was achieved with bipolar.  The dissection was carried down past the orbicularis and expose the fat.  A small amount of fat was excised in the medial compartment and then the middle compartment of the upper lid. Bipolar was used  to assure hemostasis. The incisions were closed with 6-0 Chromic as a running subcuticular.  Left upper lid.  The redundant upper eyelid was measured with 20 mm of upper eyelid skin to remain.  A # 15 blade was used to incise the upper eyelid skin. Hemostasis was achieved with bipolar cautery.  The dissection was carried down past the orbicularis and expose the fat.  A small amount of fat was excised in the medial and middle compartment of the upper lid. Bipolar was used to assure hemostasis.  The incisions were closed with 6-0 Chromic as a running subcuticular.  The incisions were washed off.  Ice was applied and patient was allowed to wake up and was taken to the recovery room with no apparent complications. Family was notified at the end of the case.   The advanced practice practitioner (APP) assisted throughout the case.  The APP was essential in retraction and counter traction when needed to make the case progress smoothly.  This retraction and assistance made it possible to see the tissue plans for the procedure.  The assistance was needed for blood control, tissue re-approximation and assisted with closure of the incision site.

## 2023-09-11 NOTE — Interval H&P Note (Signed)
History and Physical Interval Note:  09/11/2023 8:58 AM  [name] [name]  has presented today for surgery, with the diagnosis of Dermatochalasis.  The various methods of treatment have been discussed with the patient and family. After consideration of risks, benefits and other options for treatment, the patient has consented to  Procedure(s): BLEPHAROPLASTY (Bilateral) as a surgical intervention.  The patient's history has been reviewed, patient examined, no change in status, stable for surgery.  I have reviewed the patient's chart and labs.  Questions were answered to the patient's satisfaction.     [name] Shelly Spenser

## 2023-09-11 NOTE — Anesthesia Procedure Notes (Signed)
Procedure Name: LMA Insertion Date/Time: 09/11/2023 9:43 AM  Performed by: Ronnette Hila, CRNAPre-anesthesia Checklist: Patient identified, Emergency Drugs available, Suction available and Patient being monitored Patient Re-evaluated:Patient Re-evaluated prior to induction Oxygen Delivery Method: Circle system utilized Preoxygenation: Pre-oxygenation with 100% oxygen Induction Type: IV induction Ventilation: Mask ventilation without difficulty LMA: LMA flexible inserted LMA Size: 4.0 Number of attempts: 1 Airway Equipment and Method: Bite block Placement Confirmation: positive ETCO2 Tube secured with: Tape Dental Injury: Teeth and Oropharynx as per pre-operative assessment

## 2023-09-11 NOTE — Transfer of Care (Signed)
Immediate Anesthesia Transfer of Care Note  Patient: Sunday Shams Norfolk  Procedure(s) Performed: BLEPHAROPLASTY (Bilateral: Eye)  Patient Location: PACU  Anesthesia Type:General  Level of Consciousness: awake, alert , oriented, drowsy, and patient cooperative  Airway & Oxygen Therapy: Patient Spontanous Breathing and Patient connected to face mask oxygen  Post-op Assessment: Report given to RN and Post -op Vital signs reviewed and stable  Post vital signs: Reviewed and stable  Last Vitals:  Vitals Value Taken Time  BP    Temp    Pulse    Resp 16 09/11/23 1046  SpO2    Vitals shown include unfiled device data.  Last Pain:  Vitals:   09/11/23 0828  TempSrc: Temporal  PainSc: 0-No pain      Patients Stated Pain Goal: 3 (09/11/23 0960)  Complications: No notable events documented.

## 2023-09-12 ENCOUNTER — Encounter (HOSPITAL_BASED_OUTPATIENT_CLINIC_OR_DEPARTMENT_OTHER): Payer: Self-pay | Admitting: Plastic Surgery

## 2023-09-16 ENCOUNTER — Encounter: Payer: Self-pay | Admitting: Podiatry

## 2023-09-16 ENCOUNTER — Ambulatory Visit (INDEPENDENT_AMBULATORY_CARE_PROVIDER_SITE_OTHER): Payer: PPO | Admitting: Podiatry

## 2023-09-16 VITALS — Ht 63.0 in | Wt 205.5 lb

## 2023-09-16 DIAGNOSIS — B351 Tinea unguium: Secondary | ICD-10-CM | POA: Diagnosis not present

## 2023-09-16 DIAGNOSIS — M79675 Pain in left toe(s): Secondary | ICD-10-CM | POA: Diagnosis not present

## 2023-09-16 DIAGNOSIS — M79674 Pain in right toe(s): Secondary | ICD-10-CM

## 2023-09-16 NOTE — Progress Notes (Signed)
Subjective:  Patient ID: [name] [name], female    DOB: [birth year],  MRN: [id number]   [name] Shams Ventrella presents to clinic today for:  Chief Complaint  Patient presents with   Nail Problem    [facility]   Patient is for diabetic foot check today.  Notes that she has been using the urea 40 cream on the calluses on the bottom of her feet and acknowledges they are much better.  She has elongated toenails today.  There is nail polish on the nails at this time.  PCP is Dois Davenport, MD.  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   Thyroid disease    no meds    Past Surgical History:  Procedure Laterality Date   BREAST BIOPSY Right    needle bx more than 15 yrs benign, no scar visible    BROW LIFT Bilateral 09/11/2023   Procedure: BLEPHAROPLASTY;  Surgeon: Peggye Form, DO;  Location: [facility];  Service: Plastics;  Laterality: Bilateral;   LACERATION REPAIR N/A 12/13/2017   Procedure: Closure of nasal lacerations with one rotator graft and one advanced flap with skin graft, closure of left nasal laceration, closure of left ear.;  Surgeon: Suzanna Obey, MD;  Location: [facility];  Service: ENT;  Laterality: N/A;   TOTAL HIP ARTHROPLASTY Right 02/03/2021   Procedure: RIGHT TOTAL HIP ARTHROPLASTY ANTERIOR APPROACH;  Surgeon: Kathryne Hitch, MD;  Location: [facility];  Service: Orthopedics;  Laterality: Right;   upper blepharoplasty Bilateral  09/09/2023    Allergies  Allergen Reactions   Dairy Aid [Tilactase] Anaphylaxis and Rash   Metformin And Related     Nausea, diarrhea, malaise   Citric Acid Rash    Review of Systems: Negative except as noted in the HPI.  Objective:  Rosary Filosa Meinzer is a pleasant 62 y.o. female in NAD. AAO x 3.  Vascular Examination: Capillary refill time is 3-5 seconds to toes bilateral. Palpable pedal pulses b/l LE. Digital hair sparse b/l.  Skin temperature gradient WNL b/l. No varicosities b/l. No cyanosis noted b/l.   Dermatological Examination: Pedal skin with normal turgor, texture and tone b/l. No open wounds. No interdigital macerations b/l. Toenails x10 are 3mm thick, cannot ascertain color due to nail polish in place, dystrophic with subungual debris. There is pain with compression of the nail plates.  They are elongated x10.  Has some thicker skin on the plantar aspect of the foot but no true hyperkeratosis present today needing to be professionally shaved.  Assessment/Plan: 1. Pain due to onychomycosis of toenails of both feet    The toenails were sharply debrided x10 with sterile nail nippers and a power debriding burr to decrease bulk/thickness and length.  She will continue with the urea 40 cream nightly to the areas of previous calluses on the plantar aspect of her feet.  Return in about 3 months (around 12/17/2023) for [facility].   Clerance Lav, DPM, FACFAS [facility]     [address] [place], [place] [address]                Office [phone]  Fax [phone]

## 2023-09-20 ENCOUNTER — Ambulatory Visit (INDEPENDENT_AMBULATORY_CARE_PROVIDER_SITE_OTHER): Payer: PPO | Admitting: Surgical

## 2023-09-20 VITALS — BP 136/67 | HR 72

## 2023-09-20 DIAGNOSIS — H02831 Dermatochalasis of right upper eyelid: Secondary | ICD-10-CM

## 2023-09-20 DIAGNOSIS — H02834 Dermatochalasis of left upper eyelid: Secondary | ICD-10-CM

## 2023-09-20 NOTE — Progress Notes (Signed)
62 year old female here for follow-up after bilateral upper eyelid blepharoplasty with Dr. Ulice Bold on 09/11/2023.  She is just over 1 week postop.  She reports overall she is doing really well, she is here with her husband.  She reports that she has been using the eyelid/eye ointment, reports this has been dripping into her eyes and causing her to have a little bit of blurry vision.  She reports otherwise she is doing really well and does not have any specific issues.  Does not report any dry eye.  She is able to completely close her eyes.  On exam bilateral upper eyelid incisions are intact and appear to be healing very well.  She does have some swelling and ecchymosis as expected, left greater than right.  There is no erythema or cellulitic changes noted.  Monocryl suture knots are noted.  EOMs are intact, PERRL noted.  A/P:  Bilateral upper eyelid incisions are healing well, patient is doing well after blepharoplasty surgery.  She has no specific complaints.  Discussed with patient she can stop using the eyelid ointment.   Monocryl suture knots were clipped.  Recommend following up at her next scheduled appointment in approximately 2 weeks.  Call with questions or concerns.

## 2023-10-02 ENCOUNTER — Ambulatory Visit: Payer: PPO | Admitting: Student

## 2023-10-02 DIAGNOSIS — H02831 Dermatochalasis of right upper eyelid: Secondary | ICD-10-CM

## 2023-10-02 DIAGNOSIS — H02834 Dermatochalasis of left upper eyelid: Secondary | ICD-10-CM

## 2023-10-02 NOTE — Progress Notes (Signed)
Patient is a 62 year old female who underwent bilateral upper lid blepharoplasty with Dr. Ulice Bold on 09/11/2023.  She is 3 weeks postop.  Patient presents to the clinic today for postoperative follow-up.  Patient was last seen in the clinic on 09/20/2023.  At this visit, patient was doing really well.  On exam, upper eyelid incisions were intact and appear to be healing very well.  There was still some swelling and ecchymosis as expected, left greater than right.  EOMs were intact.  Plan was for patient to follow back up in 2 weeks.  Today, patient presents with her son at bedside.  She states that she is overall doing pretty well.  She states that she does have a scratching sensation from time to time to her left medial eye.  She states that sometimes towards the end of the day, her eyes feel more tired and she feels that this is when it happens more often.  She denies any other issues or concerns.  She denies any drainage.  She denies any dry eye.  She states that she is able to close her eye without any difficulty.  On exam, patient is sitting upright in no acute distress.  There is some mild swelling noted bilaterally.  EOMs are intact bilaterally.  No foreign body noted to left eye.  No scleral or conjunctival irritation.  Incision to the left eyelid appears to be intact and well-healed.  There are no suture knots noted on exam.  No signs of infection.  Right eyelid incision appears to be intact and well-healed.  There was a suture noted medially, this was removed without any difficulty.  Patient tolerated well.  No signs of infection on exam.  Discussed with the patient of does not appear that there is any suture knots that might be causing the scratching sensation to her left eye.  Recommended that she apply Vaseline once a day to her incisions and gently massage her medial eyelid to help dissolve any remaining sutures that may be there.  Also discussed that she can use refresh p.m. at the end of  the day as needed.  Patient expressed understanding.  Instructed patient to continue to monitor.  Discussed with her that if she feels she has any worsening symptoms, to let us know.  Patient expressed understanding.  Patient to follow back up in 2 weeks.

## 2023-10-14 ENCOUNTER — Ambulatory Visit (INDEPENDENT_AMBULATORY_CARE_PROVIDER_SITE_OTHER): Payer: PPO | Admitting: Physician Assistant

## 2023-10-14 VITALS — BP 103/58 | HR 75

## 2023-10-14 DIAGNOSIS — H02831 Dermatochalasis of right upper eyelid: Secondary | ICD-10-CM

## 2023-10-14 DIAGNOSIS — H02834 Dermatochalasis of left upper eyelid: Secondary | ICD-10-CM

## 2023-10-14 NOTE — Progress Notes (Signed)
Patient is a pleasant 62 year old female s/p bilateral upper lid blepharoplasty performed 09/11/2023 by Dr. Ulice Bold who presents to clinic for postoperative follow-up.    She was last seen here in clinic on 10/02/2023.  At that time, she endorsed "scratching sensation" to the left medial eye.  Otherwise doing well and reports that she could close her eyes without difficulty.  Suture removed.  Incisions CDI.  No evidence concerning for infection.  Recommended Vaseline once a day to her incisions and gentle massage of the medial eyelid that could help any residual suture knots contributing to her scratching sensation.  Follow-up as scheduled.  Today, patient is doing well from a postoperative standpoint.  She states that she has had significant improvement in her peripheral vision and overall visual acuity.  She is quite pleased with the outcome of her blepharoplasty surgery.  Shortly after her last visit, she had a suture fallout from the medial aspect of her left upper lid where she had the "scratching sensation".  This immediately relieved her symptoms and she has not had any problems since.  She continues to use a gel cooling pad to her eyes periodically which helps when they feel tired.  On exam, PERRLA and EOM intact.  No periorbital swelling or edema.  No proptosis.  No residual ecchymoses.  Conjunctiva are not injected.  Pupils are not obscured by lid lag.  She looks excellent and is exceedingly pleased.  She reports improvement from a functional standpoint since blepharoplasty.  At this time, patient to follow-up as needed and is cleared from postoperative standpoint.  Picture(s) obtained of the patient and placed in the chart were with the patient's or guardian's permission.

## 2023-10-31 ENCOUNTER — Encounter: Payer: Self-pay | Admitting: *Deleted

## 2023-10-31 ENCOUNTER — Other Ambulatory Visit: Payer: Self-pay

## 2023-10-31 ENCOUNTER — Ambulatory Visit
Admission: EM | Admit: 2023-10-31 | Discharge: 2023-10-31 | Disposition: A | Discharge status: Home / Self Care | Payer: PPO | Attending: Family Medicine | Admitting: Family Medicine

## 2023-10-31 ENCOUNTER — Ambulatory Visit (INDEPENDENT_AMBULATORY_CARE_PROVIDER_SITE_OTHER): Payer: PPO

## 2023-10-31 DIAGNOSIS — H9203 Otalgia, bilateral: Secondary | ICD-10-CM | POA: Diagnosis not present

## 2023-10-31 DIAGNOSIS — H6123 Impacted cerumen, bilateral: Secondary | ICD-10-CM

## 2023-10-31 DIAGNOSIS — H6993 Unspecified Eustachian tube disorder, bilateral: Secondary | ICD-10-CM | POA: Diagnosis not present

## 2023-10-31 DIAGNOSIS — R051 Acute cough: Secondary | ICD-10-CM

## 2023-10-31 MED ORDER — PREDNISONE 10 MG (21) PO TBPK: ORAL_TABLET | Freq: Every day | ORAL | 0 refills | Status: DC

## 2023-10-31 NOTE — ED Triage Notes (Signed)
Pt reports recurrent ear pain for 3-4 weeks. Also productive cough that "tastes sick". Denies known fever. Reports nose is "very dry"

## 2023-10-31 NOTE — ED Provider Notes (Signed)
[facility]   [id number] 10/31/23 Arrival Time: 1418  ASSESSMENT & PLAN:  1. Acute cough   2. Bilateral impacted cerumen   3. Acute otalgia, bilateral   4. Eustachian tube dysfunction, bilateral    Ears flushed with partial success. I was able to clear wax in R ear via manual curette.  Trial of: Meds ordered this encounter  Medications   predniSONE (STERAPRED UNI-PAK 21 TAB) 10 MG (21) TBPK tablet    Sig: Take by mouth daily. Take as directed.    Dispense:  21 tablet    Refill:  0   Pt requests ENT referral. Placed. Orders Placed This Encounter  Procedures   Ambulatory referral to ENT    Referral Priority:   Routine    Referral Type:   Consultation    Referral Reason:   Specialty Services Required    Requested Specialty:   Otolaryngology    Number of Visits Requested:   1   I have personally viewed and independently interpreted the imaging studies ordered this visit. CXR: no acute lung changes. Likely viral illness leading to ETD. Discussed. May try OTC daily allergy medicine.  Discussed typical duration of symptoms. OTC symptom care as needed. Ensure adequate fluid intake and rest.    Reviewed expectations re: course of current medical issues. Questions answered. Outlined signs and symptoms indicating need for more acute intervention. Patient verbalized understanding. After Visit Summary given.   SUBJECTIVE: History from: patient.  [name] [name] is a 62 y.o. female who reports productive coughing; past week; denies CP/SOB. Denies fever. Also complains of recurrent bilateral otalgia. "Ears feel clogged." Recent dry nose.  Social History   Tobacco Use  Smoking Status Former   Current packs/day: 0.00   Types: Cigarettes   Quit date: 2004   Years since quitting: 20.9  Smokeless Tobacco Never    OBJECTIVE:  Vitals:   10/31/23 1603  BP: (!) 143/71  Pulse: 79  Resp: 18  Temp: 98.2 F (36.8 C)  TempSrc: Oral  SpO2: 95%    General  appearance: alert; no distress HEENT: nasal congestion; clear runny nose; bilateral cerumen impaction; mild serous otitis bilaterally after ear wax removal; no signs of ear infection Neck: supple without LAD; trachea midline Lungs: unlabored respirations, symmetrical air entry; cough: mild; no respiratory distress; slightly coarse breath sounds bilaterally Skin: warm and dry Psychological: alert and cooperative; normal mood and affect  Allergies  Allergen Reactions   Dairy Aid [Tilactase] Anaphylaxis and Rash   Metformin And Related     Nausea, diarrhea, malaise   Citric Acid Rash    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   Thyroid disease    no meds   Family History  Problem Relation Age of  Onset   Alcohol abuse Mother    Arthritis Mother    Diabetes Mother    Hypertension Mother    Hyperlipidemia Mother    Colon polyps Mother    Other Mother        blood clotting disorder   Alcohol abuse Father    Arthritis Father    Asthma Father    Heart disease Father    Hypertension Father    Hyperlipidemia Father    Alcohol abuse Brother    Arthritis Brother    Drug abuse Brother    Social History   Socioeconomic History   Marital status: Married    Spouse name: Scientist, physiological   Number of children: 1   Years of education: Not on file   Highest education level: Not on file  Occupational History   Occupation: former Research scientist (life sciences)  Tobacco Use   Smoking status: Former     Current packs/day: 0.00    Types: Cigarettes    Quit date: 2004    Years since quitting: 20.9   Smokeless tobacco: Never  Vaping Use   Vaping status: Never Used  Substance and Sexual Activity   Alcohol use: Not Currently    Alcohol/week: 3.0 standard drinks of alcohol    Types: 1 Glasses of wine, 1 Cans of beer, 1 Shots of liquor per week   Drug use: No    Comment: THC edible 25 to 50 mg QHS   Sexual activity: Yes    Partners: Male    Birth control/protection: Post-menopausal  Other Topics Concern   Not on file  Social History Narrative   Not on file   Social Drivers of Health   Financial Resource Strain: Not on file  Food Insecurity: Not on file  Transportation Needs: Not on file  Physical Activity: Not on file  Stress: Not on file  Social Connections: Not on file  Intimate Partner Violence: Not on file             [place], MD 10/31/23 [phone]

## 2023-12-03 ENCOUNTER — Telehealth (INDEPENDENT_AMBULATORY_CARE_PROVIDER_SITE_OTHER): Payer: Self-pay | Admitting: Otolaryngology

## 2023-12-03 NOTE — Telephone Encounter (Signed)
Spoke with patient and confirmed appointment and address for 12/04/2023.

## 2023-12-04 ENCOUNTER — Encounter (INDEPENDENT_AMBULATORY_CARE_PROVIDER_SITE_OTHER): Payer: Self-pay

## 2023-12-04 ENCOUNTER — Encounter: Payer: Self-pay | Admitting: Gastroenterology

## 2023-12-04 ENCOUNTER — Ambulatory Visit (INDEPENDENT_AMBULATORY_CARE_PROVIDER_SITE_OTHER): Payer: PPO | Admitting: Otolaryngology

## 2023-12-04 VITALS — BP 134/79 | Ht 63.0 in | Wt 206.0 lb

## 2023-12-04 DIAGNOSIS — H6993 Unspecified Eustachian tube disorder, bilateral: Secondary | ICD-10-CM | POA: Diagnosis not present

## 2023-12-04 DIAGNOSIS — H6123 Impacted cerumen, bilateral: Secondary | ICD-10-CM

## 2023-12-04 NOTE — Progress Notes (Unsigned)
Dear Dr. Hal Hope, Here is my assessment for our mutual patient, [name] [name]. Thank you for allowing me the opportunity to care for your patient. Please do not hesitate to contact me should you have any other questions. Sincerely, Dr. Jovita Kussmaul  Otolaryngology Clinic Note Referring provider: Dr. Hal Hope HPI:  [name] [name] is a 63 y.o. female kindly referred by Dr. Hal Hope for evaluation of ear complaints.  Initial visit (11/2023): Patient reports: has a history of chronic ear infections. Had one in November 2024, usually feels like a "popping" with sharp pain, no drainage -- treated with antibiotics (does not remember which). She was noted to have bilateral impacted cerumen, otalgia, with suspected ETD. Ears were flushed with partial success, cleared wax in right ear. She was also prescribed prednisone for ETD. Sharp pain went away after prednisone (stopped mid time) and the wax was removed. She also had potential URI with some congestion, post nasal drip, sore throat. She is on flonase. Not using oral antihistamines.   Current symptoms including both ears feel clogged, usually left side is worse; no sharp pain. Patient denies: ear pain, vertigo, drainage, tinnitus (only when ears feel clogged). Hearing is fine. Patient additionally denies: deep pain in ear canal, eustachian tube symptoms such as popping, crackling Patient also denies barotrauma, vestibular suppressant use, ototoxic medication use Prior ear surgery: no No recent hearing test  H&N Surgery: no Personal or FHx of bleeding dz or anesthesia difficulty: no  AP/AC: ASA 81  Tobacco: smoker quit in ~2000. 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   Thyroid disease    no meds     Past Surgical History:  Procedure Laterality Date   BREAST BIOPSY Right    needle bx more than 15 yrs benign, no scar visible    BROW LIFT Bilateral 09/11/2023   Procedure: BLEPHAROPLASTY;  Surgeon: Peggye Form, DO;  Location: [facility];  Service: Plastics;  Laterality: Bilateral;   LACERATION REPAIR N/A 12/13/2017   Procedure: Closure of nasal lacerations with one rotator graft and one advanced flap with skin graft, closure of left nasal laceration, closure of left ear.;  Surgeon: Suzanna Obey, MD;  Location: [facility];  Service: ENT;  Laterality: N/A;   TOTAL HIP ARTHROPLASTY Right 02/03/2021   Procedure: RIGHT TOTAL HIP ARTHROPLASTY ANTERIOR APPROACH;  Surgeon: Kathryne Hitch, MD;  Location: [facility];  Service: Orthopedics;  Laterality: Right;   upper blepharoplasty Bilateral 09/09/2023    Family History  Problem Relation Age of Onset   Alcohol abuse Mother    Arthritis Mother    Diabetes Mother  Hypertension Mother    Hyperlipidemia Mother    Colon polyps Mother    Other Mother        blood clotting disorder   Alcohol abuse Father    Arthritis Father    Asthma Father    Heart disease Father    Hypertension Father    Hyperlipidemia Father    Alcohol abuse Brother    Arthritis Brother    Drug abuse Brother      Social Connections: Not on file      Current Outpatient Medications:    aspirin 81 MG chewable tablet, Chew 1 tablet (81 mg total) by mouth 2 (two) times daily. (Patient taking differently: Chew 81 mg by mouth  daily.), Disp: 30 tablet, Rfl: 0   cholecalciferol (VITAMIN D3) 25 MCG (1000 UNIT) tablet, Take 1,000 Units by mouth daily., Disp: , Rfl:    fluticasone (FLONASE) 50 MCG/ACT nasal spray, Place into both nostrils daily., Disp: , Rfl:    fluticasone (FLOVENT HFA) 44 MCG/ACT inhaler, Inhale into the lungs 2 (two) times daily., Disp: , Rfl:    losartan (COZAAR) 50 MG tablet, Take 50 mg by mouth daily., Disp: , Rfl:    ondansetron (ZOFRAN) 4 MG tablet, Take 1 tablet (4 mg total) by mouth every 8 (eight) hours as needed for up to 10 doses for nausea or vomiting. (Patient not taking: Reported on 10/31/2023), Disp: 10 tablet, Rfl: 0   OVER THE COUNTER MEDICATION, Take 25-35 mg by mouth at bedtime. THC nightly chewable tablet, Disp: , Rfl:    predniSONE (STERAPRED UNI-PAK 21 TAB) 10 MG (21) TBPK tablet, Take by mouth daily. Take as directed., Disp: 21 tablet, Rfl: 0   traMADol (ULTRAM) 50 MG tablet, Take 1 tablet (50 mg total) by mouth every 8 (eight) hours as needed for up to 8 doses for moderate pain or severe pain. (Patient not taking: Reported on 10/31/2023), Disp: 8 tablet, Rfl: 0   Physical Exam:   There were no vitals taken for this visit.  Salient findings:  CN II-XII intact *** Bilateral EAC clear and TM intact with well pneumatized middle ear spaces B/l cerumen impaction - cleared; clear b/l ME space Weber 512: *** Rinne 512: AC > BC b/l *** Rine 1024: AC > BC b/l *** Anterior rhinoscopy: Septum ***; bilateral inferior turbinates with *** No lesions of oral cavity/oropharynx; dentition *** No obviously palpable neck masses/lymphadenopathy/thyromegaly No respiratory distress or stridor***  Seprately Identifiable Procedures:  None***  Impression & Plans:  [name] [name] is a 63 y.o. female with ***  No diagnosis found.   - f/u ***  See below regarding exact medications prescribed this encounter including dosages and route: No orders of the defined types were placed in this  encounter.     Thank you for allowing me the opportunity to care for your patient. Please do not hesitate to contact me should you have any other questions.  Sincerely, Jovita Kussmaul, MD Otolarynoglogist (ENT), [facility] Phone: [phone] Fax: [phone]  12/04/2023, 10:01 AM   MDM:  Level *** Complexity/Problems addressed: *** Data complexity: *** independent review of *** - Morbidity: ***  - Prescription Drug prescribed or managed: ***

## 2023-12-12 ENCOUNTER — Telehealth (INDEPENDENT_AMBULATORY_CARE_PROVIDER_SITE_OTHER): Payer: Self-pay | Admitting: Audiology

## 2023-12-12 NOTE — Telephone Encounter (Signed)
LVM to confirm appt & location [id number] afm

## 2023-12-13 ENCOUNTER — Ambulatory Visit (INDEPENDENT_AMBULATORY_CARE_PROVIDER_SITE_OTHER): Payer: PPO | Admitting: Audiology

## 2023-12-25 ENCOUNTER — Other Ambulatory Visit: Payer: Self-pay | Admitting: Medical Genetics

## 2023-12-31 ENCOUNTER — Other Ambulatory Visit (HOSPITAL_COMMUNITY): Payer: Self-pay

## 2024-01-07 ENCOUNTER — Ambulatory Visit: Payer: PPO | Admitting: Podiatry

## 2024-01-07 ENCOUNTER — Encounter: Payer: Self-pay | Admitting: Podiatry

## 2024-01-07 DIAGNOSIS — E1151 Type 2 diabetes mellitus with diabetic peripheral angiopathy without gangrene: Secondary | ICD-10-CM

## 2024-01-07 DIAGNOSIS — M79674 Pain in right toe(s): Secondary | ICD-10-CM

## 2024-01-07 DIAGNOSIS — M79675 Pain in left toe(s): Secondary | ICD-10-CM

## 2024-01-07 DIAGNOSIS — B351 Tinea unguium: Secondary | ICD-10-CM | POA: Diagnosis not present

## 2024-01-07 DIAGNOSIS — L84 Corns and callosities: Secondary | ICD-10-CM

## 2024-01-07 NOTE — Progress Notes (Unsigned)
       Subjective:  Patient ID: [name] [name], female    DOB: 1961/07/05,  MRN: [id number]  [name] Shams Kupfer presents to clinic today for:  Chief Complaint  Patient presents with   Diabetes    Patient is here for [facility]    Patient notes nails are thick and elongated, causing pain in shoe gear when ambulating.  She has painful calluses on the plantar aspect of both feet.  She request that her great toenail corners be cut back today.  PCP is Dois Davenport, MD. 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  Delayed capillary refill bilateral toes.  Sparse digital hair bilateral.  Proximal to distal cooling WNL bilateral.    Dermatological Examination: Interspaces are clear with no open lesions noted bilateral.  Skin is shiny and atrophic bilateral.  Nails are 3-57mm thick, with yellowish/brown discoloration, subungual debris and distal onycholysis x10.  There is pain with compression of nails x10.  There are hyperkeratotic lesions noted bilateral submet 1 and submet 5.  Patient qualifies for at-risk foot care because of diabetes with PVD.  Assessment/Plan: 1.  Pain due to onychomycosis of toenails of both feet   2. Callus   3. Type 2 diabetes mellitus with other skin complication, without long-term current use of insulin (HCC)    Mycotic nails x10 were sharply debrided with sterile nail nippers and power debriding burr to decrease bulk and length.  Hyperkeratotic lesions x 4 on the ball of the feet were shaved with #312 blade.  Return in about 3 months (around 04/05/2024) for [facility].   Clerance Lav, DPM, FACFAS [facility]     [address] [place], [place] [address]                Office [phone]  Fax [phone]

## 2024-01-08 ENCOUNTER — Other Ambulatory Visit (HOSPITAL_COMMUNITY): Payer: PPO

## 2024-01-09 ENCOUNTER — Other Ambulatory Visit (HOSPITAL_COMMUNITY)
Admission: RE | Admit: 2024-01-09 | Discharge: 2024-01-09 | Disposition: A | Discharge status: Home / Self Care | Payer: Self-pay | Source: Ambulatory Visit | Attending: Medical Genetics | Admitting: Medical Genetics

## 2024-01-21 LAB — GENECONNECT MOLECULAR SCREEN: Genetic Analysis Overall Interpretation: NEGATIVE

## 2024-01-28 ENCOUNTER — Ambulatory Visit: Payer: PPO | Admitting: Obstetrics and Gynecology

## 2024-01-28 ENCOUNTER — Encounter: Payer: Self-pay | Admitting: Obstetrics and Gynecology

## 2024-01-28 VITALS — BP 142/84 | HR 66 | Ht 63.0 in | Wt 207.7 lb

## 2024-01-28 DIAGNOSIS — Z01419 Encounter for gynecological examination (general) (routine) without abnormal findings: Secondary | ICD-10-CM

## 2024-01-28 NOTE — Progress Notes (Signed)
 GYN is in the office for annual Rpts last pap 2 years ago NILM, pt states that she can gave records sent.  Rpts last mammogram Oct 2024 at [facility] Pt denies any issues with warts today, but has hx

## 2024-01-28 NOTE — Progress Notes (Signed)
 Subjective:     [name] [name] is a 63 y.o. female postmenopausal with BMI 46 who is here for a comprehensive physical exam. The patient reports no problems. She denies any episodes of postmenopausal vaginal bleeding. She denies any pelvic pain or abnormal discharge. She denies any urinary incontinence or issues with bowel movement. Patient is not sexually active. She is without any complaints.  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   Thyroid disease    no meds   Past Surgical History:  Procedure Laterality Date   BREAST BIOPSY Right    needle bx more than 15 yrs benign, no scar visible    BROW LIFT Bilateral 09/11/2023   Procedure: BLEPHAROPLASTY;  Surgeon: Peggye Form, DO;  Location: [facility];  Service: Plastics;  Laterality: Bilateral;   LACERATION REPAIR N/A 12/13/2017   Procedure: Closure of nasal lacerations with one rotator graft and one advanced flap with skin graft, closure of left nasal laceration, closure of left ear.;  Surgeon: Suzanna Obey, MD;  Location: [facility];  Service: ENT;  Laterality: N/A;   TOTAL HIP ARTHROPLASTY Right 02/03/2021   Procedure: RIGHT TOTAL HIP ARTHROPLASTY ANTERIOR APPROACH;  Surgeon: Kathryne Hitch, MD;  Location: [facility];  Service: Orthopedics;  Laterality: Right;   upper blepharoplasty Bilateral 09/09/2023   Family History  Problem Relation Age of Onset   Alcohol abuse Mother    Arthritis Mother     Diabetes Mother    Hypertension Mother    Hyperlipidemia Mother    Colon polyps Mother    Other Mother        blood clotting disorder   Alcohol abuse Father    Arthritis Father    Asthma Father    Heart disease Father    Hypertension Father    Hyperlipidemia Father    Alcohol abuse Brother    Arthritis Brother    Drug abuse Brother    Social History   Socioeconomic History   Marital status: Married    Spouse name: Scientist, physiological   Number of children: 1   Years of education: Not on file   Highest education level: Not on file  Occupational History   Occupation: former Research scientist (life sciences)  Tobacco Use   Smoking status: Former    Current packs/day: 0.00    Types: Cigarettes    Quit date: 2004    Years since quitting: 21.2   Smokeless tobacco: Never  Vaping Use   Vaping status: Never Used  Substance and Sexual Activity   Alcohol use: Not Currently    Alcohol/week: 3.0 standard drinks of alcohol    Types: 1 Glasses of wine, 1 Cans of beer, 1 Shots of liquor per week   Drug use: No  Comment: THC edible 25 to 50 mg QHS   Sexual activity: Yes    Partners: Male    Birth control/protection: Post-menopausal  Other Topics Concern   Not on file  Social History Narrative   Not on file   Social Drivers of Health   Financial Resource Strain: Not on file  Food Insecurity: Not on file  Transportation Needs: Not on file  Physical Activity: Not on file  Stress: Not on file  Social Connections: Not on file  Intimate Partner Violence: Not on file   Health Maintenance  Topic Date Due   Medicare Annual Wellness (AWV)  Never done   OPHTHALMOLOGY EXAM  Never done   Hepatitis C Screening  Never done   Cervical Cancer Screening (HPV/Pap Cotest)  Never done   Zoster Vaccines- Shingrix (1 of 2) Never done   Pneumococcal Vaccine 63-42 Years old (2 of 2 - PCV) 03/12/2013   Diabetic kidney evaluation - Urine ACR  11/08/2018   HEMOGLOBIN A1C  07/30/2021   Diabetic kidney evaluation - eGFR  measurement  02/04/2022   DTaP/Tdap/Td (2 - Td or Tdap) 03/12/2022   INFLUENZA VACCINE  06/13/2023   COVID-19 Vaccine (4 - 2024-25 season) 07/14/2023   Colonoscopy  01/13/2024   FOOT EXAM  04/01/2024   MAMMOGRAM  08/28/2025   HIV Screening  Completed   HPV VACCINES  Aged Out    Review of Systems Pertinent items noted in HPI and remainder of comprehensive ROS otherwise negative.   Objective:    GENERAL: Well-developed, well-nourished female in no acute distress.  HEENT: Normocephalic, atraumatic. Sclerae anicteric.  NECK: Supple. Normal thyroid.  LUNGS: Clear to auscultation bilaterally.  HEART: Regular rate and rhythm. BREASTS: Symmetric in size. No palpable masses or lymphadenopathy, skin changes, or nipple drainage. ABDOMEN: Soft, nontender, nondistended. No organomegaly. PELVIC: Normal external female genitalia. Vagina is pink and rugated.  Normal discharge. Normal appearing cervix. Uterus is normal in size. No adnexal mass or tenderness. Chaperone present during the pelvic exam EXTREMITIES: No cyanosis, clubbing, or edema, 2+ distal pulses.     Assessment:    Healthy female exam.      Plan:    Pap smear deferred by patient as she reports a normal pap smear 2 years ago- records requested Patient with normal mammogram 08/2023 Patient scheduled for colonoscopy in April Patient reports health maintenance labs done with PCP See After Visit Summary for Counseling Recommendations

## 2024-01-29 ENCOUNTER — Ambulatory Visit (AMBULATORY_SURGERY_CENTER): Payer: PPO

## 2024-01-29 VITALS — Ht 63.0 in | Wt 204.0 lb

## 2024-01-29 DIAGNOSIS — Z860100 Personal history of colon polyps, unspecified: Secondary | ICD-10-CM

## 2024-01-29 MED ORDER — SUTAB 1479-225-188 MG PO TABS: 12.0000 | ORAL_TABLET | ORAL | 0 refills | Status: DC

## 2024-01-29 NOTE — Progress Notes (Signed)

## 2024-02-07 ENCOUNTER — Encounter: Payer: Self-pay | Admitting: Gastroenterology

## 2024-02-12 ENCOUNTER — Ambulatory Visit: Payer: PPO | Admitting: Gastroenterology

## 2024-02-12 ENCOUNTER — Encounter: Payer: Self-pay | Admitting: Gastroenterology

## 2024-02-12 VITALS — BP 121/77 | HR 60 | Temp 97.4°F | Resp 12 | Ht 63.0 in | Wt 204.0 lb

## 2024-02-12 DIAGNOSIS — Z860100 Personal history of colon polyps, unspecified: Secondary | ICD-10-CM

## 2024-02-12 DIAGNOSIS — K5730 Diverticulosis of large intestine without perforation or abscess without bleeding: Secondary | ICD-10-CM

## 2024-02-12 DIAGNOSIS — D124 Benign neoplasm of descending colon: Secondary | ICD-10-CM

## 2024-02-12 DIAGNOSIS — D125 Benign neoplasm of sigmoid colon: Secondary | ICD-10-CM

## 2024-02-12 DIAGNOSIS — K5520 Angiodysplasia of colon without hemorrhage: Secondary | ICD-10-CM

## 2024-02-12 DIAGNOSIS — K644 Residual hemorrhoidal skin tags: Secondary | ICD-10-CM

## 2024-02-12 DIAGNOSIS — Z1211 Encounter for screening for malignant neoplasm of colon: Secondary | ICD-10-CM | POA: Diagnosis not present

## 2024-02-12 DIAGNOSIS — K648 Other hemorrhoids: Secondary | ICD-10-CM

## 2024-02-12 DIAGNOSIS — D123 Benign neoplasm of transverse colon: Secondary | ICD-10-CM | POA: Diagnosis not present

## 2024-02-12 DIAGNOSIS — K640 First degree hemorrhoids: Secondary | ICD-10-CM

## 2024-02-12 DIAGNOSIS — K635 Polyp of colon: Secondary | ICD-10-CM

## 2024-02-12 DIAGNOSIS — D128 Benign neoplasm of rectum: Secondary | ICD-10-CM

## 2024-02-12 MED ORDER — SODIUM CHLORIDE 0.9 % IV SOLN: 500.0000 mL | Freq: Once | INTRAVENOUS | Status: DC

## 2024-02-12 NOTE — Progress Notes (Signed)
 Drowsy, VSS, resps reg and even. Report to RN

## 2024-02-12 NOTE — Progress Notes (Signed)
 GASTROENTEROLOGY PROCEDURE H&P NOTE   Primary Care Physician: Dois Davenport, MD    Reason for Procedure:  Colon polyp surveillance  Plan:    Colonoscopy  Patient is appropriate for endoscopic procedure(s) in the ambulatory ([facility]) setting.  The nature of the procedure, as well as the risks, benefits, and alternatives were carefully and thoroughly reviewed with the patient. Ample time for discussion and questions allowed. The patient understood, was satisfied, and agreed to proceed.     HPI: [name] [name] is a 63 y.o. female who presents for colonoscopy for ongoing colon polyp surveillance and colon cancer screening.  No active GI symptoms.  No known family history of colon cancer or related malignancy.  Patient is otherwise without complaints or active issues today.  Last colonoscopy was 01/2021 and notable for 3 subcentimeter adenomas in the sigmoid colon and a 12 mm adenoma in the rectum, along with sigmoid diverticulosis, internal hemorrhoids, and non-bleeding AVMs.   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   Thyroid disease    no meds    Past Surgical History:  Procedure Laterality Date   BREAST BIOPSY Right    needle bx more than 15 yrs benign, no scar visible    BROW LIFT Bilateral 09/11/2023   Procedure: BLEPHAROPLASTY;  Surgeon: Peggye Form, DO;  Location: [facility];  Service: Plastics;  Laterality: Bilateral;   LACERATION REPAIR  N/A 12/13/2017   Procedure: Closure of nasal lacerations with one rotator graft and one advanced flap with skin graft, closure of left nasal laceration, closure of left ear.;  Surgeon: Suzanna Obey, MD;  Location: [facility];  Service: ENT;  Laterality: N/A;   TOTAL HIP ARTHROPLASTY Right 02/03/2021   Procedure: RIGHT TOTAL HIP ARTHROPLASTY ANTERIOR APPROACH;  Surgeon: Kathryne Hitch, MD;  Location: [facility];  Service: Orthopedics;  Laterality: Right;   upper blepharoplasty Bilateral 09/09/2023    Prior to Admission medications   Medication Sig Start Date End Date Taking? Authorizing Provider  aspirin 81 MG chewable tablet Chew 1 tablet (81 mg total) by mouth 2 (two) times daily. Patient taking differently: Chew 81 mg by mouth daily. 02/04/21  Yes Kathryne Hitch, MD  cholecalciferol (VITAMIN D3) 25 MCG (1000 UNIT) tablet Take 1,000 Units by mouth daily.   Yes [provider]  fluticasone (FLOVENT HFA) 44 MCG/ACT inhaler Inhale into the lungs 2 (two) times daily.   Yes [provider]  losartan (COZAAR) 50 MG tablet Take 50 mg by mouth daily.   Yes [provider]  [facility] ULTRA test strip  02/06/24  Yes [provider]  OVER THE COUNTER MEDICATION Take 25-35 mg by mouth at bedtime. THC nightly chewable tablet   Yes [provider]  Current Outpatient Medications  Medication Sig Dispense Refill   aspirin 81 MG chewable tablet Chew 1 tablet (81 mg total) by mouth 2 (two) times daily. (Patient taking differently: Chew 81 mg by mouth daily.) 30 tablet 0   cholecalciferol (VITAMIN D3) 25 MCG (1000 UNIT) tablet Take 1,000 Units by mouth daily.     fluticasone (FLOVENT HFA) 44 MCG/ACT inhaler Inhale into the lungs 2 (two) times daily.     losartan (COZAAR) 50 MG tablet Take 50 mg by mouth daily.     ONETOUCH ULTRA test strip      OVER THE COUNTER MEDICATION Take 25-35 mg by mouth at bedtime. THC nightly chewable tablet     Current  Facility-Administered Medications  Medication Dose Route Frequency Provider Last Rate Last Admin   0.9 %  sodium chloride infusion  500 mL Intravenous Once Zailyn Thoennes V, DO        Allergies as of 02/12/2024 - Review Complete 01/29/2024  Allergen Reaction Noted   Dairy aid [tilactase] Anaphylaxis and Rash 08/13/2018   Metformin and related  07/19/2020   Citric acid Rash 08/13/2018    Family History  Problem Relation Age of Onset   Alcohol abuse Mother    Arthritis Mother    Diabetes Mother    Hypertension Mother    Hyperlipidemia Mother    Colon polyps Mother    Other Mother        blood clotting disorder   Alcohol abuse Father    Arthritis Father    Asthma Father    Heart disease Father    Hypertension Father    Hyperlipidemia Father    Alcohol abuse Brother    Arthritis Brother    Drug abuse Brother    Colon cancer Neg Hx    Esophageal cancer Neg Hx    Rectal cancer Neg Hx    Stomach cancer Neg Hx     Social History   Socioeconomic History   Marital status: Married    Spouse name: Bruce   Number of children: 1   Years of education: Not on file   Highest education level: Not on file  Occupational History   Occupation: former Research scientist (life sciences)  Tobacco Use   Smoking status: Former    Current packs/day: 0.00    Types: Cigarettes    Quit date: 2004    Years since quitting: 21.2   Smokeless tobacco: Never  Vaping Use   Vaping status: Never Used  Substance and Sexual Activity   Alcohol use: Yes    Alcohol/week: 3.0 standard drinks of alcohol    Types: 1 Glasses of wine, 1 Cans of beer, 1 Shots of liquor per week    Comment: rpts very small amount socially   Drug use: No    Comment: THC edible 25 to 50 mg QHS   Sexual activity: Not Currently    Partners: Male    Birth control/protection: Post-menopausal  Other Topics Concern   Not on file  Social History Narrative   Not on file   Social Drivers of Health   Financial Resource Strain: Not on  file  Food Insecurity: Not on file  Transportation Needs: Not on file  Physical Activity: Not on file  Stress: Not on file  Social Connections: Not on file  Intimate Partner Violence: Not on file    Physical Exam: Vital signs in last 24 hours: @BP  (!) 145/72   Pulse 62   Temp (!) 97.4 F (36.3 C) (Temporal)  Ht 5\' 3"  (1.6 m)   Wt 204 lb (92.5 kg)   SpO2 98%   BMI 36.14 kg/m  GEN: NAD EYE: Sclerae anicteric ENT: MMM CV: Non-tachycardic Pulm: CTA b/l GI: Soft, NT/ND NEURO:  Alert & Oriented x 3   Doristine Locks, DO [facility]   02/12/2024 11:02 AM

## 2024-02-12 NOTE — Progress Notes (Signed)
 Called to room to assist during endoscopic procedure.  Patient ID and intended procedure confirmed with present staff. Received instructions for my participation in the procedure from the performing physician.

## 2024-02-12 NOTE — Patient Instructions (Signed)

## 2024-02-12 NOTE — Op Note (Signed)
 [facility] Patient Name: [name] [name] Procedure Date: 02/12/2024 11:37 AM MRN: [id number] Endoscopist: Doristine Locks , MD, [id number] Age: 63 Referring MD:  Date of Birth: [birth year] Gender: Female Account #: [id number] Procedure:                Colonoscopy Indications:              Surveillance: Personal history of adenomatous                            polyps on last colonoscopy 3 years ago                           Last colonoscopy was 01/2021 and notable for 3                            subcentimeter adenomas in the sigmoid colon and a                            12 mm adenoma in the rectum, along with sigmoid                            diverticulosis, internal hemorrhoids, and                            non-bleeding AVMs. Medicines:                Monitored Anesthesia Care Procedure:                Pre-Anesthesia Assessment:                           - Prior to the procedure, a History and Physical                            was performed, and patient medications and                            allergies were reviewed. The patient's tolerance of                            previous anesthesia was also reviewed. The risks                            and benefits of the procedure and the sedation                            options and risks were discussed with the patient.                            All questions were answered, and informed consent                            was obtained. Prior Anticoagulants: The patient has  taken no anticoagulant or antiplatelet agents. ASA                            Grade Assessment: II - A patient with mild systemic                            disease. After reviewing the risks and benefits,                            the patient was deemed in satisfactory condition to                            undergo the procedure.                           After obtaining informed consent, the colonoscope                             was passed under direct vision. Throughout the                            procedure, the patient's blood pressure, pulse, and                            oxygen saturations were monitored continuously. The                            Olympus Scope [id number] was introduced through the                            anus and advanced to the the cecum, identified by                            appendiceal orifice and ileocecal valve. The                            colonoscopy was performed without difficulty. The                            patient tolerated the procedure well. The quality                            of the bowel preparation was good. The ileocecal                            valve, appendiceal orifice, and rectum were                            photographed. Scope In: 11:41:17 AM Scope Out: 12:09:46 PM Scope Withdrawal Time: 0 hours 24 minutes 15 seconds  Total Procedure Duration: 0 hours 28 minutes 29 seconds  Findings:                 Skin tags were found on perianal exam.  A 5 mm polyp was found in the transverse colon. The                            polyp was sessile. The polyp was removed with a                            cold snare. Resection and retrieval were complete.                            Estimated blood loss was minimal.                           Four sessile polyps were found in the sigmoid colon                            and descending colon. The polyps were 3 to 8 mm in                            size. These polyps were removed with a cold snare.                            Resection and retrieval were complete. Estimated                            blood loss was minimal.                           An 8 mm polyp was found in the rectum. The polyp                            was flat with apparent scar in the middle. The                            polyp was removed with a cold snare with wide                            excision  margin. Resection and retrieval were                            complete. Estimated blood loss was minimal.                           Multiple small-mouthed diverticula were found in                            the sigmoid colon.                           Non-bleeding internal hemorrhoids were found during                            retroflexion. The hemorrhoids were small.  Multiple small angioectasias with typical                            arborization were found in the transverse colon, in                            the ascending colon and in the cecum. Complications:            No immediate complications. Estimated Blood Loss:     Estimated blood loss was minimal. Impression:               - Perianal skin tags found on perianal exam.                           - One 5 mm polyp in the transverse colon, removed                            with a cold snare. Resected and retrieved.                           - Four 3 to 8 mm polyps in the sigmoid colon and in                            the descending colon, removed with a cold snare.                            Resected and retrieved.                           - One 8 mm polyp in the rectum, removed with a cold                            snare. Resected and retrieved.                           - Diverticulosis in the sigmoid colon.                           - Non-bleeding internal hemorrhoids.                           - Multiple colonic angioectasias. Recommendation:           - Patient has a contact number available for                            emergencies. The signs and symptoms of potential                            delayed complications were discussed with the                            patient. Return to normal activities tomorrow.  Written discharge instructions were provided to the                            patient.                           - Resume previous diet.                            - Continue present medications.                           - Await pathology results.                           - Repeat colonoscopy for surveillance based on                            pathology results.                           - Return to GI office PRN. Doristine Locks, MD 02/12/2024 12:19:14 PM

## 2024-02-13 ENCOUNTER — Telehealth: Payer: Self-pay

## 2024-02-13 NOTE — Telephone Encounter (Signed)
  Follow up Call-     02/12/2024   10:40 AM  Call back number  Post procedure Call Back phone  # [phone]  Permission to leave phone message Yes     Patient questions:  Do you have a fever, pain , or abdominal swelling? No. Pain Score  0 *  Have you tolerated food without any problems? Yes.    Have you been able to return to your normal activities? Yes.    Do you have any questions about your discharge instructions: Diet   No. Medications  No. Follow up visit  No.  Do you have questions or concerns about your Care? No.  Actions: * If pain score is 4 or above: No action needed, pain <4.

## 2024-02-14 LAB — SURGICAL PATHOLOGY

## 2024-02-17 ENCOUNTER — Encounter: Payer: Self-pay | Admitting: Gastroenterology

## 2024-03-12 ENCOUNTER — Encounter: Payer: Self-pay | Admitting: Pulmonary Disease

## 2024-03-12 ENCOUNTER — Ambulatory Visit: Admitting: Pulmonary Disease

## 2024-03-12 VITALS — BP 118/76 | HR 67 | Ht 63.0 in | Wt 205.8 lb

## 2024-03-12 DIAGNOSIS — Z8739 Personal history of other diseases of the musculoskeletal system and connective tissue: Secondary | ICD-10-CM | POA: Diagnosis not present

## 2024-03-12 DIAGNOSIS — G4733 Obstructive sleep apnea (adult) (pediatric): Secondary | ICD-10-CM | POA: Diagnosis not present

## 2024-03-12 DIAGNOSIS — Z87891 Personal history of nicotine dependence: Secondary | ICD-10-CM

## 2024-03-12 DIAGNOSIS — Z862 Personal history of diseases of the blood and blood-forming organs and certain disorders involving the immune mechanism: Secondary | ICD-10-CM | POA: Diagnosis not present

## 2024-03-12 DIAGNOSIS — E119 Type 2 diabetes mellitus without complications: Secondary | ICD-10-CM

## 2024-03-12 NOTE — Progress Notes (Signed)
 [name] [name]    [id number]    08/19/1961  Primary Care Physician:Richter, Jinnie Mountain, MD  Referring Physician: Allene Ivan, MD [address] [place],  [place] [address]  Chief complaint:   Patient being seen for obstructive sleep apnea  HPI:  Diagnosed with obstructive sleep apnea in September 2021 Diagnosed during the study  Has been using CPAP since then  Sleeps well, wakes up feeling like she is at a good nights rest   Usually goes to bed around 930, wakes up about 630-730 Wakes up feeling rested No headaches in the morning No night sweats-sometimes feels it is related to being perimenopausal Memory is fair to poor Does not take daytime naps Level of alertness during the day is very good  History of hypertension-controlled Diabetes-no longer on any medications Polycythemia, carpal tunnel  Smoked in the past smoked up to 2 packs a day quit in 2000 Never diagnosed with any lung disease  She is not short of breath with any activity  Overall feels in good health  Has benefited from being on CPAP  Outpatient Encounter Medications as of 03/12/2024  Medication Sig   aspirin  81 MG chewable tablet Chew 1 tablet (81 mg total) by mouth 2 (two) times daily. (Patient taking differently: Chew 81 mg by mouth daily.)   cholecalciferol (VITAMIN D3) 25 MCG (1000 UNIT) tablet Take 1,000 Units by mouth daily.   fluticasone (FLOVENT HFA) 44 MCG/ACT inhaler Inhale into the lungs 2 (two) times daily.   fluticasone-salmeterol (ADVAIR) 250-50 MCG/ACT AEPB Inhale 1 puff into the lungs in the morning and at bedtime.   losartan (COZAAR) 50 MG tablet Take 50 mg by mouth daily.   ONETOUCH ULTRA test strip    OVER THE COUNTER MEDICATION Take 25-35 mg by mouth at bedtime. THC nightly chewable tablet   No facility-administered encounter medications on file as of 03/12/2024.    Allergies as of 03/12/2024 - Review Complete 03/12/2024  Allergen Reaction Noted    Dairy aid [tilactase] Anaphylaxis and Rash 08/13/2018   Metformin  and related  07/19/2020   Citric acid Rash 08/13/2018    Past Medical History:  Diagnosis Date   Anxiety    Chickenpox    Depression    Dog bite of face    Elevated liver enzymes    ABD US  done 08/2016: fatty liver   Fatty liver    ABD US  done 08/2016: fatty liver   History of kidney stones    Hyperlipidemia    no longer needs meds   Hypertension    pt states no longer needs meds   Hypothyroidism    No longer takes meds   OA (osteoarthritis) of hip    right   Obesity    Polycythemia    Not the vera   Schizophrenia (HCC)    Sleep apnea    uses a CPAP   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   Thyroid  disease    no meds    Past Surgical History:  Procedure Laterality Date   BREAST BIOPSY Right    needle bx more than 15 yrs benign, no scar visible    BROW LIFT Bilateral 09/11/2023   Procedure: BLEPHAROPLASTY;  Surgeon: Thornell Flirt, DO;  Location: [facility];  Service: Plastics;  Laterality: Bilateral;   LACERATION REPAIR N/A 12/13/2017   Procedure: Closure of nasal lacerations with one rotator graft and one advanced flap with skin graft, closure  of left nasal laceration, closure of left ear.;  Surgeon: Vernadine Golas, MD;  Location: [facility];  Service: ENT;  Laterality: N/A;   TOTAL HIP ARTHROPLASTY Right 02/03/2021   Procedure: RIGHT TOTAL HIP ARTHROPLASTY ANTERIOR APPROACH;  Surgeon: Arnie Lao, MD;  Location: [facility];  Service: Orthopedics;  Laterality: Right;   upper blepharoplasty Bilateral 09/09/2023    Family History  Problem Relation Age of Onset   Alcohol abuse Mother    Arthritis Mother    Diabetes Mother    Hypertension Mother    Hyperlipidemia Mother    Colon polyps Mother    Other Mother        blood clotting disorder   Alcohol abuse Father    Arthritis Father    Asthma Father    Heart disease Father    Hypertension Father    Hyperlipidemia Father     Alcohol abuse Brother    Arthritis Brother    Drug abuse Brother    Colon cancer Neg Hx    Esophageal cancer Neg Hx    Rectal cancer Neg Hx    Stomach cancer Neg Hx     Social History   Socioeconomic History   Marital status: Married    Spouse name: Bruce   Number of children: 1   Years of education: Not on file   Highest education level: Not on file  Occupational History   Occupation: former Research scientist (life sciences)  Tobacco Use   Smoking status: Former    Current packs/day: 0.00    Types: Cigarettes    Quit date: 2004    Years since quitting: 21.3   Smokeless tobacco: Never  Vaping Use   Vaping status: Never Used  Substance and Sexual Activity   Alcohol use: Yes    Alcohol/week: 3.0 standard drinks of alcohol    Types: 1 Glasses of wine, 1 Cans of beer, 1 Shots of liquor per week    Comment: rpts very small amount socially   Drug use: No    Comment: THC edible 25 to 50 mg QHS   Sexual activity: Not Currently    Partners: Male    Birth control/protection: Post-menopausal  Other Topics Concern   Not on file  Social History Narrative   Not on file   Social Drivers of Health   Financial Resource Strain: Not on file  Food Insecurity: Not on file  Transportation Needs: Not on file  Physical Activity: Not on file  Stress: Not on file  Social Connections: Not on file  Intimate Partner Violence: Not on file    Review of Systems  Constitutional:  Negative for fatigue.  Respiratory:  Positive for apnea. Negative for shortness of breath.   Psychiatric/Behavioral:  Positive for sleep disturbance.     Vitals:   03/12/24 1434  BP: 118/76  Pulse: 67  SpO2: 98%     Physical Exam Constitutional:      Appearance: She is obese.  HENT:     Head: Normocephalic.  Eyes:     General: No scleral icterus. Cardiovascular:     Rate and Rhythm: Normal rate and regular rhythm.     Heart sounds: No murmur heard.    No friction rub.  Pulmonary:     Effort: No respiratory  distress.     Breath sounds: No stridor. No wheezing.  Musculoskeletal:     Cervical back: No rigidity or tenderness.  Neurological:     Mental Status: She is alert.  Psychiatric:  Mood and Affect: Mood normal.      Data Reviewed: CPAP download reviewed showing excellent compliance with CPAP Auto CPAP 6-20 with heated humidification, EPR of 3 Residual AHI of 1.0, 95 percentile pressure of 10.5  Sleep study from [facility] reviewed showing REI of 12.8,  Assessment:  Mild obstructive sleep apnea - Excellent compliance with CPAP - Continues to benefit from CPAP use - Wakes up rested in the morning  History of polycythemia - Controlled  History of Of tunnel syndrome  Diabetes   Plan/Recommendations: Continue CPAP on a nightly basis  Will place an order for new CPAP settings of 6-20  Place an order for CPAP supplies  Follow-up in about 6 months  Call us [facility] us  with any significant concerns   Myer Artis MD [facility] 03/12/2024, 2:59 PM  CC: Allene Ivan, MD

## 2024-03-12 NOTE — Patient Instructions (Addendum)
  Will place an order for CPAP supplies  Place an order for new machine  Mask and headgear for a DreamWear small nasal mask  Order for new machine auto CPAP 6-20 with an EPR of 3 with heated humidification  Follow-up in about 6 months  Download from your machine shows excellent compliance with excellent efficiency of the machine  Call/message us  with any concerns

## 2024-03-23 ENCOUNTER — Ambulatory Visit: Payer: PPO | Admitting: Podiatry

## 2024-03-25 ENCOUNTER — Other Ambulatory Visit (INDEPENDENT_AMBULATORY_CARE_PROVIDER_SITE_OTHER): Payer: Self-pay

## 2024-03-25 ENCOUNTER — Encounter: Payer: Self-pay | Admitting: Orthopaedic Surgery

## 2024-03-25 ENCOUNTER — Ambulatory Visit: Admitting: Orthopaedic Surgery

## 2024-03-25 VITALS — Ht 63.0 in | Wt 205.0 lb

## 2024-03-25 DIAGNOSIS — M25552 Pain in left hip: Secondary | ICD-10-CM

## 2024-03-25 NOTE — Progress Notes (Signed)
 The patient is a 63 year old female that we replaced her right hip back in March 2022 secondary to severe arthritis.  She had let it go for a long period of time and her x-rays were quite significant in terms of the severe arthritis and complete loss of the joint space.  She has recently started developing left hip and groin pain.  She states that she is not ready for hip replacement surgery on that side but did not wanted to get as bad as her other side.  She says sometimes the pain comes and goes and she knows is not there yet.  She is scared about potentially falling.  On exam her right operative hip moves smoothly and fluidly.  The left hip has stiffness with internal and external rotation as well as pain with rotation and the pain is in the groin.  AP pelvis and lateral of the left hip shows slightly worsening superior lateral narrowing as well as osteophytes that are slightly worsened.  It is nowhere near the severe bone-on-bone arthritis that she had with her right hip when she had hip replacement surgery.  She has been more on hip strengthening exercises and I would like to send her to Dr. Vaughn Georges for an ultrasound-guided steroid injection in her left hip joint.  He can then get her back to me about a month after that injection and we can determine whether or not she benefit from even outpatient physical therapy for hip strengthening exercises.

## 2024-03-31 ENCOUNTER — Ambulatory Visit: Admitting: Sports Medicine

## 2024-03-31 ENCOUNTER — Other Ambulatory Visit: Payer: Self-pay

## 2024-03-31 ENCOUNTER — Encounter: Payer: Self-pay | Admitting: Sports Medicine

## 2024-03-31 DIAGNOSIS — M25552 Pain in left hip: Secondary | ICD-10-CM | POA: Diagnosis not present

## 2024-03-31 DIAGNOSIS — E119 Type 2 diabetes mellitus without complications: Secondary | ICD-10-CM | POA: Diagnosis not present

## 2024-03-31 DIAGNOSIS — M1612 Unilateral primary osteoarthritis, left hip: Secondary | ICD-10-CM | POA: Diagnosis not present

## 2024-03-31 MED ORDER — METHYLPREDNISOLONE ACETATE 40 MG/ML IJ SUSP
40.0000 mg | INTRAMUSCULAR | Status: AC | PRN
  Administered 2024-03-31: 40 mg via INTRA_ARTICULAR

## 2024-03-31 MED ORDER — LIDOCAINE HCL 1 % IJ SOLN
4.0000 mL | INTRAMUSCULAR | Status: AC | PRN
  Administered 2024-03-31: 4 mL

## 2024-03-31 NOTE — Progress Notes (Signed)
   Office & Procedure Note  Patient: [name] [name]             Date of Birth: 10/02/1961           MRN: [id number]             Visit Date: 03/31/2024  HPI: [name] [name] is a pleasant 64 year old female who presents today for symptomatic left hip osteoarthritis.  She has seen Dr. Lucienne Ryder, he did replace her right hip years ago.  He sent her to me for possible hip injection and further evaluation.  He did discuss physical therapy with Brandie, but she would like to hold on this for now.  She is a type II diabetic, although recently has been very well-controlled.  She is diet controlled now and not on medicine.  She does check her sugars via fingerstick as needed.  PE:  - Left hip: There is no redness swelling or effusion.  There is some restriction with endrange internal and external logroll.  Positive FADIR test.  Imaging:  - 03/25/24: XR HIP UNILAT W OR W/O PELVIS 1V LEFT An AP pelvis and lateral of the left hip shows slight joint space  narrowing and osteophytes around the lateral edge of the femoral head and  acetabulum that have slightly increased from films compared to his far  back as 3 years ago.  Visit Diagnoses:  1. Unilateral primary osteoarthritis, left hip   2. Pain in left hip   3. Type 2 diabetes mellitus without complication, without long-term current use of insulin  (HCC)    Procedures: Large Joint Inj: L hip joint on 03/31/2024 1:45 PM Indications: pain Details: 22 G 3.5 in needle, ultrasound-guided anterior approach Medications: 4 mL lidocaine  1 %; 40 mg methylPREDNISolone  acetate 40 MG/ML Outcome: tolerated well, no immediate complications  Procedure: US -guided intra-articular hip injection, Left After discussion on risks/benefits/indications and informed verbal consent was obtained, a timeout was performed. Patient was lying supine on exam table. The hip was cleaned with betadine  and alcohol swabs . Then utilizing ultrasound guidance, the patient's femoral head and  neck junction was identified and subsequently injected with 4:1 lidocaine :depomedrol via an in-plane approach with ultrasound visualization of the injectate administered into the hip joint. Patient tolerated procedure well without immediate complications.  Procedure, treatment alternatives, risks and benefits explained, specific risks discussed. Consent was given by the patient. Immediately prior to procedure a time out was called to verify the correct patient, procedure, equipment, support staff and site/side marked as required. Patient was prepped and draped in the usual sterile fashion.      Plan: - Her physical exam and x-rays are indicative of hip osteoarthritis. - Through shared decision-making, we did proceed with ultrasound-guided left hip intra-articular injection, patient tolerated well.  Did discuss postinjection protocol, may use ice/heat or Tylenol  for any pain. - She does have a history of type 2 diabetes which has been uncontrolled in years past but recently has been very well-controlled.  Discussed transient sugar rise, may check her sugars via fingerstick only as needed. - She will follow-up with Dr. Lucienne Ryder in 1 month for reevaluation.  Shauna Del, DO Primary Care Sports Medicine Physician  [facility] - Orthopedics  This note was dictated using Dragon naturally speaking software and may contain errors in syntax, spelling, or content which have not been identified prior to signing this note.

## 2024-04-20 ENCOUNTER — Ambulatory Visit: Admitting: Pulmonary Disease

## 2024-04-22 ENCOUNTER — Ambulatory Visit: Admitting: Orthopaedic Surgery

## 2024-04-27 ENCOUNTER — Ambulatory Visit: Admitting: Podiatry

## 2024-05-04 ENCOUNTER — Encounter: Payer: Self-pay | Admitting: Orthopaedic Surgery

## 2024-05-04 ENCOUNTER — Ambulatory Visit: Admitting: Orthopaedic Surgery

## 2024-05-04 DIAGNOSIS — M1612 Unilateral primary osteoarthritis, left hip: Secondary | ICD-10-CM | POA: Diagnosis not present

## 2024-05-04 DIAGNOSIS — M25552 Pain in left hip: Secondary | ICD-10-CM

## 2024-05-04 NOTE — Progress Notes (Signed)
 The patient is well-known to us .  She is only 63 years old and very active.  We have replaced her right hip several years ago.  She does have a leg length difference and that this does affect her balance.  She has been dealing now with left hip and groin pain on the left side.  She has tried inserts for her shoes on the left side and she has had a recent intra-articular steroid injection in the left hip which helped minimally.  She stays active.  On exam her right hip moves smoothly and fluidly.  The left hip has pain on extremes of rotation.  Again x-rays show arthritic changes in the left hip joint which are significant.  At this point she would like to try outpatient physical therapy and I agree with this as well.  This will be to work on strengthening the muscles around the left hip as well as anything that the therapist can help with decreasing her hip pain but also working on her balance and low back pain to the left side.  From my standpoint I will then see her back in 6 weeks after course of therapy.  She agrees with this treatment plan.

## 2024-06-15 ENCOUNTER — Encounter: Payer: Self-pay | Admitting: Orthopaedic Surgery

## 2024-06-15 ENCOUNTER — Ambulatory Visit: Admitting: Orthopaedic Surgery

## 2024-06-15 VITALS — Ht 63.0 in | Wt 205.0 lb

## 2024-06-15 DIAGNOSIS — M25552 Pain in left hip: Secondary | ICD-10-CM

## 2024-06-15 DIAGNOSIS — M1612 Unilateral primary osteoarthritis, left hip: Secondary | ICD-10-CM

## 2024-06-15 NOTE — Progress Notes (Signed)
 The patient is well-known to us .  We replaced her right hip back in 2022 secondary to significant arthritis.  She is only 63 years old.  She has developed significant arthritis in her left hip which is bone-on-bone.  She also has a leg length difference with the right operative side longer than the left side and this is a sign of her posture.  She has tried activity modification.  She cannot take anti-inflammatories other than Tylenol  due to her previous stroke history.  She is a diabetic.  Her BMI today is 36.31.  She has had an intra-articular steroid injection in her left hip by Dr. Burnetta and that did not really help much.  We tried to set her up for outpatient physical therapy but somehow we did not get that done and dropped the ball and RN.  However she did participate in a home exercise program as a relates to her left hip.  She does walk with a Trendelenburg gait.  Her left hip has significant decreased with internal and external rotation and severe pain in the groin with rotation.  Her right operative hip moves fairly and fluidly.  There is a leg length difference as well with the right side shorter than the left.  At this point she does wish to proceed with a hip replacement on the left side and we agreed with this as well due to her significant arthritis of the left hip with bone-on-bone wear combined with the failure of conservative treatment and her daily hip pain that is detrimentally affecting her mobility, her quality of life and her actives of daily living.  Having had this before on the right side she is fully aware of the risks and benefits of surgery and what to expect from an intraoperative and postoperative standpoint.  All questions and concerns were answered and addressed.  Will work on scheduling her in the near future for a left total hip replacement.

## 2024-07-07 ENCOUNTER — Other Ambulatory Visit: Payer: Self-pay | Admitting: Physician Assistant

## 2024-07-07 DIAGNOSIS — Z01818 Encounter for other preprocedural examination: Secondary | ICD-10-CM

## 2024-07-15 ENCOUNTER — Other Ambulatory Visit: Payer: Self-pay | Admitting: Family Medicine

## 2024-07-15 DIAGNOSIS — Z1231 Encounter for screening mammogram for malignant neoplasm of breast: Secondary | ICD-10-CM

## 2024-07-16 ENCOUNTER — Telehealth: Payer: Self-pay

## 2024-07-16 NOTE — Telephone Encounter (Signed)
 Patient called and stated she needs to have a root canal on Monday.  She states there is no infection since it was caught early.  She says the dental providers think it is okay for THA on Tuesday.  Are you okay with proceeding?

## 2024-07-16 NOTE — Telephone Encounter (Signed)
I called and advised patient.

## 2024-07-17 NOTE — Pre-Procedure Instructions (Signed)
 Surgical Instructions   Your procedure is scheduled on July 21, 2024. Report to [facility] Main Entrance A at 0800 A.M., then check in with the Admitting office. Any questions or running late day of surgery: call [phone]  Questions prior to your surgery date: call [phone], Monday-Friday, 8am-4pm. If you experience any cold or flu symptoms such as cough, fever, chills, shortness of breath, etc. between now and your scheduled surgery, please notify us  at the above number.     Remember:  Do not eat after midnight the night before your surgery  You may drink clear liquids until 0700 the morning of your surgery.   Clear liquids allowed are: Water , Non-Citrus Juices (without pulp), Carbonated Beverages, Clear Tea (no milk, honey, etc.), Black Coffee Only (NO MILK, CREAM OR POWDERED CREAMER of any kind), and Gatorade.  Patient Instructions  The night before surgery:  No food after midnight. ONLY clear liquids after midnight  The day of surgery (if you do NOT have diabetes):  Drink ONE (1) Pre-Surgery Clear Ensure by 0700AM the morning of surgery. Drink in one sitting. Do not sip.  This drink was given to you during your hospital  pre-op appointment visit.  Nothing else to drink after completing the  Pre-Surgery Clear Ensure.          If you have questions, please contact your surgeon's office.     Take these medicines the morning of surgery:   fluticasone-salmeterol (ADVAIR) 250-50 MCG/ACT AEPB  fluticasone (FLONASE) 50 MCG/ACT nasal spray     Follow your surgeon's instructions on when to stop Aspirin  prior to surgery. If no instructions were given, please contact your surgeon's office.   One week prior to surgery, STOP taking any  Aleve, Naproxen, Ibuprofen, Motrin, Advil, Goody's, BC's, all herbal medications, fish oil, and non-prescription vitamins.                     Do NOT Smoke (Tobacco/Vaping) for 24 hours prior to your procedure.  If you use a CPAP  at night, you may bring your mask/headgear for your overnight stay.   You will be asked to remove any contacts, glasses, piercing's, hearing aid's, dentures/partials prior to surgery. Please bring cases for these items if needed.    Patients discharged the day of surgery will not be allowed to drive home, and someone needs to stay with them for 24 hours.  SURGICAL WAITING ROOM VISITATION Patients may have no more than 2 support people in the waiting area - these visitors may rotate.   Pre-op nurse will coordinate an appropriate time for 1 ADULT support person, who may not rotate, to accompany patient in pre-op.  Children under the age of 70 must have an adult with them who is not the patient and must remain in the main waiting area with an adult.  If the patient needs to stay at the hospital during part of their recovery, the visitor guidelines for inpatient rooms apply.  Please refer to the [facility] website for the visitor guidelines for any additional information.   If you received a COVID test during your pre-op visit  it is requested that you wear a mask when out in public, stay away from anyone that may not be feeling well and notify your surgeon if you develop symptoms. If you have been in contact with anyone that has tested positive in the last 10 days please notify you surgeon.      Pre-operative 5 CHG Bathing Instructions  You can play a key role in reducing the risk of infection after surgery. Your skin needs to be as free of germs as possible. You can reduce the number of germs on your skin by washing with CHG (chlorhexidine  gluconate) soap before surgery. CHG is an antiseptic soap that kills germs and continues to kill germs even after washing.   DO NOT use if you have an allergy to chlorhexidine /CHG or antibacterial soaps. If your skin becomes reddened or irritated, stop using the CHG and notify one of our RNs at [phone].   Please shower with the CHG soap starting 4  days before surgery using the following schedule:     Please keep in mind the following:  DO NOT shave, including legs and underarms, starting the day of your first shower.   You may shave your face at any point before/day of surgery.  Place clean sheets on your bed the day you start using CHG soap. Use a clean washcloth (not used since being washed) for each shower. DO NOT sleep with pets once you start using the CHG.   CHG Shower Instructions:  Wash your face and private area with normal soap. If you choose to wash your hair, wash first with your normal shampoo.  After you use shampoo/soap, rinse your hair and body thoroughly to remove shampoo/soap residue.  Turn the water  OFF and apply about 3 tablespoons (45 ml) of CHG soap to a CLEAN washcloth.  Apply CHG soap ONLY FROM YOUR NECK DOWN TO YOUR TOES (washing for 3-5 minutes)  DO NOT use CHG soap on face, private areas, open wounds, or sores.  Pay special attention to the area where your surgery is being performed.  If you are having back surgery, having someone wash your back for you may be helpful. Wait 2 minutes after CHG soap is applied, then you may rinse off the CHG soap.  Pat dry with a clean towel  Put on clean clothes/pajamas   If you choose to wear lotion, please use ONLY the CHG-compatible lotions that are listed below.  Additional instructions for the day of surgery: DO NOT APPLY any lotions, deodorants, cologne, or perfumes.   Do not bring valuables to the hospital. [facility] is not responsible for any belongings/valuables. Do not wear nail polish, gel polish, artificial nails, or any other type of covering on natural nails (fingers and toes) Do not wear jewelry or makeup Put on clean/comfortable clothes.  Please brush your teeth.  Ask your nurse before applying any prescription medications to the skin.     CHG Compatible Lotions   Aveeno Moisturizing lotion  Cetaphil Moisturizing Cream  Cetaphil Moisturizing  Lotion  Clairol Herbal Essence Moisturizing Lotion, Dry Skin  Clairol Herbal Essence Moisturizing Lotion, Extra Dry Skin  Clairol Herbal Essence Moisturizing Lotion, Normal Skin  Curel Age Defying Therapeutic Moisturizing Lotion with Alpha Hydroxy  Curel Extreme Care Body Lotion  Curel Soothing Hands Moisturizing Hand Lotion  Curel Therapeutic Moisturizing Cream, Fragrance-Free  Curel Therapeutic Moisturizing Lotion, Fragrance-Free  Curel Therapeutic Moisturizing Lotion, Original Formula  Eucerin Daily Replenishing Lotion  Eucerin Dry Skin Therapy Plus Alpha Hydroxy Crme  Eucerin Dry Skin Therapy Plus Alpha Hydroxy Lotion  Eucerin Original Crme  Eucerin Original Lotion  Eucerin Plus Crme Eucerin Plus Lotion  Eucerin TriLipid Replenishing Lotion  Keri Anti-Bacterial Hand Lotion  Keri Deep Conditioning Original Lotion Dry Skin Formula Softly Scented  Keri Deep Conditioning Original Lotion, Fragrance Free Sensitive Skin Formula  Keri Lotion Fast Absorbing  Fragrance Free Sensitive Skin Formula  Keri Lotion Fast Absorbing Softly Scented Dry Skin Formula  Keri Original Lotion  Keri Skin Renewal Lotion Keri Silky Smooth Lotion  Keri Silky Smooth Sensitive Skin Lotion  Nivea Body Creamy Conditioning Oil  Nivea Body Extra Enriched Lotion  Nivea Body Original Lotion  Nivea Body Sheer Moisturizing Lotion Nivea Crme  Nivea Skin Firming Lotion  NutraDerm 30 Skin Lotion  NutraDerm Skin Lotion  NutraDerm Therapeutic Skin Cream  NutraDerm Therapeutic Skin Lotion  ProShield Protective Hand Cream  Provon moisturizing lotion  Please read over the following fact sheets that you were given.

## 2024-07-20 ENCOUNTER — Other Ambulatory Visit: Payer: Self-pay

## 2024-07-20 ENCOUNTER — Encounter (HOSPITAL_COMMUNITY): Payer: Self-pay

## 2024-07-20 ENCOUNTER — Encounter (HOSPITAL_COMMUNITY)
Admission: RE | Admit: 2024-07-20 | Discharge: 2024-07-20 | Disposition: A | Discharge status: Home / Self Care | Source: Ambulatory Visit | Attending: Orthopaedic Surgery | Admitting: Orthopaedic Surgery

## 2024-07-20 DIAGNOSIS — Z01818 Encounter for other preprocedural examination: Secondary | ICD-10-CM

## 2024-07-20 DIAGNOSIS — Z01812 Encounter for preprocedural laboratory examination: Secondary | ICD-10-CM | POA: Insufficient documentation

## 2024-07-20 LAB — CBC
HCT: 46.4 % — ABNORMAL HIGH (ref 36.0–46.0)
Hemoglobin: 15 g/dL (ref 12.0–15.0)
MCH: 30.9 pg (ref 26.0–34.0)
MCHC: 32.3 g/dL (ref 30.0–36.0)
MCV: 95.5 fL (ref 80.0–100.0)
Platelets: 187 K/uL (ref 150–400)
RBC: 4.86 MIL/uL (ref 3.87–5.11)
RDW: 13.2 % (ref 11.5–15.5)
WBC: 4.6 K/uL (ref 4.0–10.5)
nRBC: 0 % (ref 0.0–0.2)

## 2024-07-20 LAB — BASIC METABOLIC PANEL WITH GFR
Anion gap: 9 (ref 5–15)
BUN: 15 mg/dL (ref 8–23)
CO2: 23 mmol/L (ref 22–32)
Calcium: 9.2 mg/dL (ref 8.9–10.3)
Chloride: 108 mmol/L (ref 98–111)
Creatinine, Ser: 0.61 mg/dL (ref 0.44–1.00)
GFR, Estimated: >60 mL/min (ref >60)
Glucose, Bld: 127 mg/dL — ABNORMAL HIGH (ref 70–99)
Potassium: 3.8 mmol/L (ref 3.5–5.1)
Sodium: 140 mmol/L (ref 135–145)

## 2024-07-20 LAB — SURGICAL PCR SCREEN
MRSA, PCR: NEGATIVE
Staphylococcus aureus: NEGATIVE

## 2024-07-20 LAB — GLUCOSE, CAPILLARY: Glucose-Capillary: 126 mg/dL — ABNORMAL HIGH (ref 70–99)

## 2024-07-20 LAB — TYPE AND SCREEN
ABO/RH(D): O POS
Antibody Screen: NEGATIVE

## 2024-07-20 LAB — HEMOGLOBIN A1C
Hgb A1c MFr Bld: 5.6 % (ref 4.8–5.6)
Mean Plasma Glucose: 114.02 mg/dL

## 2024-07-20 NOTE — Progress Notes (Addendum)
 PCP - Dr. Darice Henle MD [facility]) - requested A1C (6.0) lab report from 06-16-24 Cardiologist - Denies  PPM/ICD - Denies Device Orders - n/a Rep Notified - n/a  Chest x-ray - Denies EKG - 09-09-23 Stress Test - Denies ECHO - 07-13-20 Cardiac Cath - Denies  Sleep Study - Yes CPAP - per patient wears nightly  Fasting Blood Sugar - 95-110 Checks Blood Sugar every morning  Last dose of GLP1 agonist-  Denies GLP1 instructions: n/a  Blood Thinner Instructions: Denies Aspirin  Instructions: Per patient last dose on 07-19-24  ERAS Protcol - Clears until 0700 PRE-SURGERY Ensure or G2- patient refused drink  COVID TEST- n/a   Anesthesia review: Yes, HTN and stroke  Patient denies shortness of breath, fever, cough and chest pain at PAT appointment. Patient denies any respiratory issues at this time.    All instructions explained to the patient, with a verbal understanding of the material. Patient agrees to go over the instructions while at home for a better understanding. Patient also instructed to self quarantine after being tested for COVID-19. The opportunity to ask questions was provided.

## 2024-07-20 NOTE — H&P (Signed)
 TOTAL HIP ADMISSION H&P  Patient is admitted for left total hip arthroplasty.  Subjective:  Chief Complaint: left hip pain  HPI: [name] [name], 63 y.o. female, has a history of pain and functional disability in the left hip(s) due to arthritis and patient has failed non-surgical conservative treatments for greater than 12 weeks to include corticosteriod injections, flexibility and strengthening excercises, use of assistive devices, weight reduction as appropriate, and activity modification.  Onset of symptoms was gradual starting a few years ago with gradually worsening course since that time.The patient noted no past surgery on the left hip(s).  Patient currently rates pain in the left hip at 10 out of 10 with activity. Patient has night pain, worsening of pain with activity and weight bearing, trendelenberg gait, pain that interfers with activities of daily living, and pain with passive range of motion. Patient has evidence of subchondral sclerosis, periarticular osteophytes, and joint space narrowing by imaging studies. This condition presents safety issues increasing the risk of falls.  There is no current active infection.  Patient Active Problem List   Diagnosis Date Noted   Dermatochalasis 05/22/2023   Status post total replacement of right hip 02/03/2021   Positive colorectal cancer screening using Cologuard test 12/19/2020   Unilateral primary osteoarthritis, left knee 11/16/2020   Unilateral primary osteoarthritis, left hip 07/28/2020   Transaminitis    Erythrocytosis    Uncontrolled type 2 diabetes mellitus with hyperglycemia, with long-term current use of insulin  (HCC)    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   Schizoaffective disorder (HCC) 07/14/2020   Acute ischemic stroke (HCC) 07/13/2020   Dog bite of face 08/13/2018   Dog bite of ear 08/13/2018   Diabetes mellitus without complication (HCC)    Mood disorder (HCC) 11/11/2017   Type 2 diabetes mellitus without  complication, without long-term current use of insulin  (HCC) 11/11/2017   Class 2 obesity with body mass index (BMI) of 37.0 to 37.9 in adult 11/11/2017   Mixed hyperlipidemia 11/11/2017   Tinea corporis 11/08/2017   Elevated liver enzymes 03/12/2015   HTN (hypertension) 03/11/2015   Hypothyroidism 12/14/2011   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   Thyroid  disease    no meds    Past Surgical History:  Procedure Laterality Date   BREAST BIOPSY Right    needle bx more than 15 yrs benign, no scar visible    BROW LIFT Bilateral 09/11/2023   Procedure: BLEPHAROPLASTY;  Surgeon: Lowery Estefana RAMAN, DO;  Location: [facility];  Service: Plastics;  Laterality: Bilateral;   LACERATION REPAIR N/A 12/13/2017   Procedure: Closure of nasal lacerations with one rotator graft and one advanced flap with skin graft, closure of left nasal laceration,  closure of left ear.;  Surgeon: Roark Rush, MD;  Location: [facility];  Service: ENT;  Laterality: N/A;   TOTAL HIP ARTHROPLASTY Right 02/03/2021   Procedure: RIGHT TOTAL HIP ARTHROPLASTY ANTERIOR APPROACH;  Surgeon: Vernetta Lonni GRADE, MD;  Location: [facility];  Service: Orthopedics;  Laterality: Right;   upper blepharoplasty Bilateral 09/09/2023    No current facility-administered medications for this encounter.   Current Outpatient Medications  Medication Sig Dispense Refill Last  Dose/Taking   aspirin  81 MG chewable tablet Chew 1 tablet (81 mg total) by mouth 2 (two) times daily. (Patient taking differently: Chew 81 mg by mouth daily.) 30 tablet 0 Taking Differently   cholecalciferol  (VITAMIN D3) 25 MCG (1000 UNIT) tablet Take 1,000 Units by mouth daily.   Taking   fluticasone (FLONASE) 50 MCG/ACT nasal spray Place into both nostrils daily.   Taking   fluticasone-salmeterol (ADVAIR) 250-50 MCG/ACT AEPB Inhale 1 puff into the lungs in the morning and at bedtime.   Taking   losartan  (COZAAR ) 100 MG tablet Take 50 mg by mouth daily.   Taking   NON FORMULARY Pt uses a cpap nightly   Taking   OVER THE COUNTER MEDICATION Take 25-35 mg by mouth at bedtime. THC nightly chewable tablet   Taking   ONETOUCH ULTRA test strip       Allergies  Allergen Reactions   Dairy Aid [Tilactase] Anaphylaxis and Rash   Metformin  And Related Diarrhea and Nausea Only    Nausea, diarrhea, malaise   Citric Acid Rash    Social History   Tobacco Use   Smoking status: Former    Current packs/day: 0.00    Types: Cigarettes    Quit date: 2004    Years since quitting: 21.7   Smokeless tobacco: Never  Substance Use Topics   Alcohol use: Yes    Alcohol/week: 3.0 standard drinks of alcohol    Types: 1 Glasses of wine, 1 Cans of beer, 1 Shots of liquor per week    Comment: rpts very small amount socially    Family History  Problem Relation Age of Onset   Alcohol abuse Mother    Arthritis Mother    Diabetes Mother    Hypertension Mother    Hyperlipidemia Mother    Colon polyps Mother    Other Mother        blood clotting disorder   Alcohol abuse Father    Arthritis Father    Asthma Father    Heart disease Father    Hypertension Father    Hyperlipidemia Father    Alcohol abuse Brother    Arthritis Brother    Drug abuse Brother    Colon cancer Neg Hx    Esophageal cancer Neg Hx    Rectal cancer Neg Hx    Stomach cancer Neg Hx      Review of Systems  Objective:  Physical  Exam Vitals reviewed.  Constitutional:      Appearance: Normal appearance. She is obese.  HENT:     Head: Normocephalic and atraumatic.  Eyes:     Extraocular Movements: Extraocular movements intact.     Pupils: Pupils are equal, round, and reactive to light.  Cardiovascular:     Rate and Rhythm: Normal rate and regular rhythm.  Pulmonary:     Effort: Pulmonary effort is normal.     Breath sounds: Normal breath sounds.  Abdominal:     Palpations: Abdomen is soft.  Musculoskeletal:     Cervical back: Normal range  of motion and neck supple.     Left hip: Tenderness and bony tenderness present. Decreased range of motion.  Neurological:     Mental Status: She is alert and oriented to person, place, and time.  Psychiatric:        Behavior: Behavior normal.     Vital signs in last 24 hours: Temp:  [98.2 F (36.8 C)] 98.2 F (36.8 C) (09/08 0951) Pulse Rate:  [64] 64 (09/08 0951) Resp:  [20] 20 (09/08 0951) BP: (130)/(84) 130/84 (09/08 0951) SpO2:  [98 %] 98 % (09/08 0951) Weight:  [93.9 kg] 93.9 kg (09/08 0951)  Labs:   Estimated body mass index is 36.69 kg/m as calculated from the following:   Height as of 07/20/24: 5' 3 (1.6 m).   Weight as of 07/20/24: 93.9 kg.   Imaging Review Plain radiographs demonstrate severe degenerative joint disease of the left hip(s). The bone quality appears to be excellent for age and reported activity level.      Assessment/Plan:  End stage arthritis, left hip(s)  The patient history, physical examination, clinical judgement of the provider and imaging studies are consistent with end stage degenerative joint disease of the left hip(s) and total hip arthroplasty is deemed medically necessary. The treatment options including medical management, injection therapy, arthroscopy and arthroplasty were discussed at length. The risks and benefits of total hip arthroplasty were presented and reviewed. The risks due to aseptic loosening, infection,  stiffness, dislocation/subluxation,  thromboembolic complications and other imponderables were discussed.  The patient acknowledged the explanation, agreed to proceed with the plan and consent was signed. Patient is being admitted for inpatient treatment for surgery, pain control, PT, OT, prophylactic antibiotics, VTE prophylaxis, progressive ambulation and ADL's and discharge planning.The patient is planning to be discharged home with home health services

## 2024-07-21 ENCOUNTER — Ambulatory Visit (HOSPITAL_COMMUNITY): Payer: Self-pay | Admitting: Physician Assistant

## 2024-07-21 ENCOUNTER — Observation Stay (HOSPITAL_COMMUNITY)

## 2024-07-21 ENCOUNTER — Ambulatory Visit (HOSPITAL_BASED_OUTPATIENT_CLINIC_OR_DEPARTMENT_OTHER): Admitting: Anesthesiology

## 2024-07-21 ENCOUNTER — Encounter (HOSPITAL_COMMUNITY): Payer: Self-pay | Admitting: Orthopaedic Surgery

## 2024-07-21 ENCOUNTER — Observation Stay (HOSPITAL_COMMUNITY)
Admission: RE | Admit: 2024-07-21 | Discharge: 2024-07-23 | Disposition: A | Discharge status: Home / Self Care | Attending: Orthopaedic Surgery | Admitting: Orthopaedic Surgery

## 2024-07-21 ENCOUNTER — Encounter (HOSPITAL_COMMUNITY)
Admission: RE | Disposition: A | Discharge status: Home / Self Care | Payer: Self-pay | Source: Home / Self Care | Attending: Orthopaedic Surgery

## 2024-07-21 ENCOUNTER — Ambulatory Visit (HOSPITAL_COMMUNITY)

## 2024-07-21 ENCOUNTER — Other Ambulatory Visit: Payer: Self-pay

## 2024-07-21 DIAGNOSIS — Z87891 Personal history of nicotine dependence: Secondary | ICD-10-CM | POA: Diagnosis not present

## 2024-07-21 DIAGNOSIS — Z96642 Presence of left artificial hip joint: Secondary | ICD-10-CM

## 2024-07-21 DIAGNOSIS — M1612 Unilateral primary osteoarthritis, left hip: Principal | ICD-10-CM | POA: Insufficient documentation

## 2024-07-21 DIAGNOSIS — I110 Hypertensive heart disease with heart failure: Secondary | ICD-10-CM | POA: Insufficient documentation

## 2024-07-21 DIAGNOSIS — M25552 Pain in left hip: Secondary | ICD-10-CM | POA: Diagnosis present

## 2024-07-21 DIAGNOSIS — Z794 Long term (current) use of insulin: Secondary | ICD-10-CM | POA: Insufficient documentation

## 2024-07-21 DIAGNOSIS — E119 Type 2 diabetes mellitus without complications: Secondary | ICD-10-CM | POA: Diagnosis not present

## 2024-07-21 DIAGNOSIS — E039 Hypothyroidism, unspecified: Secondary | ICD-10-CM | POA: Diagnosis not present

## 2024-07-21 DIAGNOSIS — Z7982 Long term (current) use of aspirin: Secondary | ICD-10-CM | POA: Diagnosis not present

## 2024-07-21 DIAGNOSIS — Z96641 Presence of right artificial hip joint: Secondary | ICD-10-CM | POA: Insufficient documentation

## 2024-07-21 DIAGNOSIS — I509 Heart failure, unspecified: Secondary | ICD-10-CM | POA: Diagnosis not present

## 2024-07-21 DIAGNOSIS — Z79899 Other long term (current) drug therapy: Secondary | ICD-10-CM | POA: Diagnosis not present

## 2024-07-21 DIAGNOSIS — I5030 Unspecified diastolic (congestive) heart failure: Secondary | ICD-10-CM | POA: Diagnosis not present

## 2024-07-21 HISTORY — PX: TOTAL HIP ARTHROPLASTY: SHX124

## 2024-07-21 LAB — GLUCOSE, CAPILLARY
Glucose-Capillary: 120 mg/dL — ABNORMAL HIGH (ref 70–99)
Glucose-Capillary: 112 mg/dL — ABNORMAL HIGH (ref 70–99)

## 2024-07-21 SURGERY — ARTHROPLASTY, HIP, TOTAL, ANTERIOR APPROACH
Anesthesia: Monitor Anesthesia Care | Site: Hip | Laterality: Left

## 2024-07-21 MED ORDER — MOMETASONE FURO-FORMOTEROL FUM 200-5 MCG/ACT IN AERO
2.0000 | INHALATION_SPRAY | Freq: Two times a day (BID) | RESPIRATORY_TRACT | Status: DC
  Filled 2024-07-21 (×2): qty 8.8

## 2024-07-21 MED ORDER — LOSARTAN POTASSIUM 50 MG PO TABS
50.0000 mg | ORAL_TABLET | Freq: Every day | ORAL | Status: DC
  Administered 2024-07-21 – 2024-07-23 (×3): 50 mg via ORAL
  Filled 2024-07-21 (×3): qty 1

## 2024-07-21 MED ORDER — METOCLOPRAMIDE HCL 5 MG PO TABS: 5.0000 mg | ORAL_TABLET | Freq: Three times a day (TID) | ORAL | Status: DC | PRN

## 2024-07-21 MED ORDER — ONDANSETRON HCL 4 MG PO TABS: 4.0000 mg | ORAL_TABLET | Freq: Four times a day (QID) | ORAL | Status: DC | PRN

## 2024-07-21 MED ORDER — SODIUM CHLORIDE 0.9 % IR SOLN
Status: DC | PRN
  Administered 2024-07-21: 1000 mL

## 2024-07-21 MED ORDER — METHOCARBAMOL 500 MG PO TABS
500.0000 mg | ORAL_TABLET | Freq: Four times a day (QID) | ORAL | Status: DC | PRN
  Administered 2024-07-22: 500 mg via ORAL
  Filled 2024-07-21: qty 1

## 2024-07-21 MED ORDER — ASPIRIN 81 MG PO CHEW
81.0000 mg | CHEWABLE_TABLET | Freq: Two times a day (BID) | ORAL | Status: DC
  Administered 2024-07-21 – 2024-07-23 (×4): 81 mg via ORAL
  Filled 2024-07-21 (×4): qty 1

## 2024-07-21 MED ORDER — TRANEXAMIC ACID-NACL 1000-0.7 MG/100ML-% IV SOLN
1000.0000 mg | INTRAVENOUS | Status: AC
  Administered 2024-07-21: 1000 mg via INTRAVENOUS

## 2024-07-21 MED ORDER — PANTOPRAZOLE SODIUM 40 MG PO TBEC
40.0000 mg | DELAYED_RELEASE_TABLET | Freq: Every day | ORAL | Status: DC
  Administered 2024-07-21 – 2024-07-23 (×3): 40 mg via ORAL
  Filled 2024-07-21 (×3): qty 1

## 2024-07-21 MED ORDER — CHLORHEXIDINE GLUCONATE 0.12 % MT SOLN
OROMUCOSAL | Status: AC
  Administered 2024-07-21: 15 mL via OROMUCOSAL
  Filled 2024-07-21: qty 15

## 2024-07-21 MED ORDER — CHLORHEXIDINE GLUCONATE 0.12 % MT SOLN: 15.0000 mL | Freq: Once | OROMUCOSAL | Status: AC

## 2024-07-21 MED ORDER — OXYCODONE HCL 5 MG PO TABS
10.0000 mg | ORAL_TABLET | ORAL | Status: DC | PRN
  Administered 2024-07-21 – 2024-07-22 (×3): 15 mg via ORAL
  Filled 2024-07-21 (×3): qty 3

## 2024-07-21 MED ORDER — CEFAZOLIN SODIUM-DEXTROSE 2-4 GM/100ML-% IV SOLN
2.0000 g | Freq: Four times a day (QID) | INTRAVENOUS | Status: AC
  Administered 2024-07-21 – 2024-07-22 (×2): 2 g via INTRAVENOUS
  Filled 2024-07-21 (×2): qty 100

## 2024-07-21 MED ORDER — PHENOL 1.4 % MT LIQD: 1.0000 | OROMUCOSAL | Status: DC | PRN

## 2024-07-21 MED ORDER — PHENYLEPHRINE 80 MCG/ML (10ML) SYRINGE FOR IV PUSH (FOR BLOOD PRESSURE SUPPORT)
PREFILLED_SYRINGE | INTRAVENOUS | Status: DC | PRN
Start: 2024-07-21 — End: 2024-07-21
  Administered 2024-07-21 (×2): 80 ug via INTRAVENOUS
  Administered 2024-07-21: 160 ug via INTRAVENOUS

## 2024-07-21 MED ORDER — DIPHENHYDRAMINE HCL 12.5 MG/5ML PO ELIX: 12.5000 mg | ORAL_SOLUTION | ORAL | Status: DC | PRN

## 2024-07-21 MED ORDER — PROPOFOL 500 MG/50ML IV EMUL
INTRAVENOUS | Status: DC | PRN
  Administered 2024-07-21: 100 ug/kg/min via INTRAVENOUS

## 2024-07-21 MED ORDER — ALUM & MAG HYDROXIDE-SIMETH 200-200-20 MG/5ML PO SUSP: 30.0000 mL | ORAL | Status: DC | PRN

## 2024-07-21 MED ORDER — ORAL CARE MOUTH RINSE: 15.0000 mL | Freq: Once | OROMUCOSAL | Status: AC

## 2024-07-21 MED ORDER — OXYCODONE HCL 5 MG PO TABS: 5.0000 mg | ORAL_TABLET | Freq: Once | ORAL | Status: DC | PRN

## 2024-07-21 MED ORDER — FENTANYL CITRATE (PF) 250 MCG/5ML IJ SOLN
INTRAMUSCULAR | Status: AC
  Filled 2024-07-21: qty 5

## 2024-07-21 MED ORDER — FENTANYL CITRATE (PF) 250 MCG/5ML IJ SOLN
INTRAMUSCULAR | Status: DC | PRN
  Administered 2024-07-21: 25 ug via INTRAVENOUS
  Administered 2024-07-21: 50 ug via INTRAVENOUS
  Administered 2024-07-21 (×4): 25 ug via INTRAVENOUS

## 2024-07-21 MED ORDER — CEFAZOLIN SODIUM-DEXTROSE 2-4 GM/100ML-% IV SOLN
INTRAVENOUS | Status: AC
  Filled 2024-07-21: qty 100

## 2024-07-21 MED ORDER — ONDANSETRON HCL 4 MG/2ML IJ SOLN: 4.0000 mg | Freq: Four times a day (QID) | INTRAMUSCULAR | Status: DC | PRN

## 2024-07-21 MED ORDER — ONDANSETRON HCL 4 MG/2ML IJ SOLN: 4.0000 mg | Freq: Once | INTRAMUSCULAR | Status: DC | PRN

## 2024-07-21 MED ORDER — POVIDONE-IODINE 10 % EX SWAB
2.0000 | Freq: Once | CUTANEOUS | Status: AC
Start: 2024-07-21 — End: 2024-07-21
  Administered 2024-07-21: 2 via TOPICAL

## 2024-07-21 MED ORDER — DROPERIDOL 2.5 MG/ML IJ SOLN: 0.6250 mg | Freq: Once | INTRAMUSCULAR | Status: DC | PRN

## 2024-07-21 MED ORDER — LACTATED RINGERS IV SOLN: INTRAVENOUS | Status: DC

## 2024-07-21 MED ORDER — MIDAZOLAM HCL 2 MG/2ML IJ SOLN
INTRAMUSCULAR | Status: AC
  Filled 2024-07-21: qty 2

## 2024-07-21 MED ORDER — TRANEXAMIC ACID-NACL 1000-0.7 MG/100ML-% IV SOLN
INTRAVENOUS | Status: AC
  Filled 2024-07-21: qty 100

## 2024-07-21 MED ORDER — HYDROMORPHONE HCL 1 MG/ML IJ SOLN
0.2500 mg | INTRAMUSCULAR | Status: DC | PRN
  Administered 2024-07-21: 0.5 mg via INTRAVENOUS

## 2024-07-21 MED ORDER — 0.9 % SODIUM CHLORIDE (POUR BTL) OPTIME
TOPICAL | Status: DC | PRN
  Administered 2024-07-21: 1000 mL

## 2024-07-21 MED ORDER — METHOCARBAMOL 1000 MG/10ML IJ SOLN: 500.0000 mg | Freq: Four times a day (QID) | INTRAMUSCULAR | Status: DC | PRN

## 2024-07-21 MED ORDER — SODIUM CHLORIDE 0.9 % IV SOLN: INTRAVENOUS | Status: DC

## 2024-07-21 MED ORDER — CEFAZOLIN SODIUM-DEXTROSE 2-4 GM/100ML-% IV SOLN
2.0000 g | INTRAVENOUS | Status: AC
  Administered 2024-07-21: 2 g via INTRAVENOUS

## 2024-07-21 MED ORDER — ACETAMINOPHEN 325 MG PO TABS: 325.0000 mg | ORAL_TABLET | Freq: Four times a day (QID) | ORAL | Status: DC | PRN

## 2024-07-21 MED ORDER — OXYCODONE HCL 5 MG/5ML PO SOLN: 5.0000 mg | Freq: Once | ORAL | Status: DC | PRN

## 2024-07-21 MED ORDER — DOCUSATE SODIUM 100 MG PO CAPS
100.0000 mg | ORAL_CAPSULE | Freq: Two times a day (BID) | ORAL | Status: DC
  Administered 2024-07-21 – 2024-07-23 (×2): 100 mg via ORAL
  Filled 2024-07-21 (×3): qty 1

## 2024-07-21 MED ORDER — MIDAZOLAM HCL 2 MG/2ML IJ SOLN
INTRAMUSCULAR | Status: DC | PRN
  Administered 2024-07-21: 2 mg via INTRAVENOUS

## 2024-07-21 MED ORDER — ONDANSETRON HCL 4 MG/2ML IJ SOLN
INTRAMUSCULAR | Status: DC | PRN
  Administered 2024-07-21: 4 mg via INTRAVENOUS

## 2024-07-21 MED ORDER — HYDROMORPHONE HCL 1 MG/ML IJ SOLN: 0.5000 mg | INTRAMUSCULAR | Status: DC | PRN

## 2024-07-21 MED ORDER — PROPOFOL 10 MG/ML IV BOLUS
INTRAVENOUS | Status: DC | PRN
  Administered 2024-07-21: 30 mg via INTRAVENOUS
  Administered 2024-07-21: 100 mg via INTRAVENOUS
  Administered 2024-07-21: 50 mg via INTRAVENOUS
  Administered 2024-07-21: 20 mg via INTRAVENOUS

## 2024-07-21 MED ORDER — OXYCODONE HCL 5 MG PO TABS
5.0000 mg | ORAL_TABLET | ORAL | Status: DC | PRN
  Administered 2024-07-22: 10 mg via ORAL
  Filled 2024-07-21: qty 2

## 2024-07-21 MED ORDER — METOCLOPRAMIDE HCL 5 MG/ML IJ SOLN: 5.0000 mg | Freq: Three times a day (TID) | INTRAMUSCULAR | Status: DC | PRN

## 2024-07-21 MED ORDER — ALBUMIN HUMAN 5 % IV SOLN
INTRAVENOUS | Status: DC | PRN
Start: 2024-07-21 — End: 2024-07-21

## 2024-07-21 MED ORDER — DEXAMETHASONE SODIUM PHOSPHATE 10 MG/ML IJ SOLN
INTRAMUSCULAR | Status: DC | PRN
  Administered 2024-07-21: 5 mg via INTRAVENOUS

## 2024-07-21 MED ORDER — HYDROMORPHONE HCL 1 MG/ML IJ SOLN
INTRAMUSCULAR | Status: AC
  Filled 2024-07-21: qty 1

## 2024-07-21 MED ORDER — VITAMIN D 25 MCG (1000 UNIT) PO TABS
1000.0000 [IU] | ORAL_TABLET | Freq: Every day | ORAL | Status: DC
  Administered 2024-07-21 – 2024-07-23 (×3): 1000 [IU] via ORAL
  Filled 2024-07-21 (×3): qty 1

## 2024-07-21 MED ORDER — PROPOFOL 10 MG/ML IV BOLUS
INTRAVENOUS | Status: AC
  Filled 2024-07-21: qty 20

## 2024-07-21 MED ORDER — INSULIN ASPART 100 UNIT/ML IJ SOLN: 0.0000 [IU] | INTRAMUSCULAR | Status: DC | PRN

## 2024-07-21 MED ORDER — MENTHOL 3 MG MT LOZG: 1.0000 | LOZENGE | OROMUCOSAL | Status: DC | PRN

## 2024-07-21 SURGICAL SUPPLY — 45 items
BAG COUNTER SPONGE SURGICOUNT (BAG) ×1 IMPLANT
BENZOIN TINCTURE PRP APPL 2/3 (GAUZE/BANDAGES/DRESSINGS) ×1 IMPLANT
BLADE CLIPPER SURG (BLADE) IMPLANT
BLADE SAW SGTL 18X1.27X75 (BLADE) ×1 IMPLANT
COVER SURGICAL LIGHT HANDLE (MISCELLANEOUS) ×1 IMPLANT
CUP ACETAB W/GRIPTION 54 (Plate) IMPLANT
DRAPE C-ARM 42X72 X-RAY (DRAPES) ×1 IMPLANT
DRAPE STERI IOBAN 125X83 (DRAPES) ×1 IMPLANT
DRAPE U-SHAPE 47X51 STRL (DRAPES) ×3 IMPLANT
DRSG AQUACEL AG ADV 3.5X10 (GAUZE/BANDAGES/DRESSINGS) ×1 IMPLANT
DURAPREP 26ML APPLICATOR (WOUND CARE) ×1 IMPLANT
ELECT BLADE 6.5 EXT (BLADE) IMPLANT
ELECTRODE BLDE 4.0 EZ CLN MEGD (MISCELLANEOUS) ×1 IMPLANT
ELECTRODE REM PT RTRN 9FT ADLT (ELECTROSURGICAL) ×1 IMPLANT
FACESHIELD WRAPAROUND OR TEAM (MASK) ×2 IMPLANT
GLOVE BIOGEL PI IND STRL 8 (GLOVE) ×2 IMPLANT
GLOVE ECLIPSE 8.0 STRL XLNG CF (GLOVE) ×1 IMPLANT
GLOVE ORTHO TXT STRL SZ7.5 (GLOVE) ×2 IMPLANT
GOWN STRL REUS W/ TWL LRG LVL3 (GOWN DISPOSABLE) ×2 IMPLANT
GOWN STRL REUS W/ TWL XL LVL3 (GOWN DISPOSABLE) ×2 IMPLANT
HEAD CERAMIC DELTA 36 PLUS 1.5 (Hips) IMPLANT
KIT BASIN OR (CUSTOM PROCEDURE TRAY) ×1 IMPLANT
KIT TURNOVER KIT B (KITS) ×1 IMPLANT
LINER NEUTRAL 54X36MM PLUS 4 (Hips) IMPLANT
MANIFOLD NEPTUNE II (INSTRUMENTS) ×1 IMPLANT
NS IRRIG 1000ML POUR BTL (IV SOLUTION) ×1 IMPLANT
PACK TOTAL JOINT (CUSTOM PROCEDURE TRAY) ×1 IMPLANT
PAD ARMBOARD POSITIONER FOAM (MISCELLANEOUS) ×1 IMPLANT
PIN SECTOR W/GRIP ACE CUP 52MM (Hips) IMPLANT
SET HNDPC FAN SPRY TIP SCT (DISPOSABLE) ×1 IMPLANT
STAPLER SKIN PROX 35W (STAPLE) IMPLANT
STEM FEM ACTIS STD SZ4 (Stem) IMPLANT
STRIP CLOSURE SKIN 1/2X4 (GAUZE/BANDAGES/DRESSINGS) ×2 IMPLANT
SUT ETHIBOND NAB CT1 #1 30IN (SUTURE) ×1 IMPLANT
SUT MNCRL AB 4-0 PS2 18 (SUTURE) IMPLANT
SUT VIC AB 0 CT1 27XBRD ANBCTR (SUTURE) ×1 IMPLANT
SUT VIC AB 1 CT1 27XBRD ANBCTR (SUTURE) ×1 IMPLANT
SUT VIC AB 2-0 CT1 (SUTURE) IMPLANT
SUT VIC AB 2-0 CT1 TAPERPNT 27 (SUTURE) ×1 IMPLANT
TOWEL GREEN STERILE (TOWEL DISPOSABLE) ×1 IMPLANT
TOWEL GREEN STERILE FF (TOWEL DISPOSABLE) ×1 IMPLANT
TRAY CATH INTERMITTENT SS 16FR (CATHETERS) IMPLANT
TRAY FOLEY W/BAG SLVR 14FR (SET/KITS/TRAYS/PACK) IMPLANT
TRAY FOLEY W/BAG SLVR 16FR ST (SET/KITS/TRAYS/PACK) IMPLANT
WATER STERILE IRR 1000ML POUR (IV SOLUTION) ×2 IMPLANT

## 2024-07-21 NOTE — Op Note (Signed)
 Operative Note  Date of operation: 07/21/2024 Preoperative diagnosis: Left hip primary osteoarthritis Postoperative diagnosis: Same  Procedure: Left direct anterior total hip arthroplasty  Implants: Implant Name Type Inv. Item Serial No. Manufacturer Lot No. LRB No. Used Action  CUP ACETAB W/GRIPTION 54 - [id number] Plate CUP ACETAB W/GRIPTION 54  [employer] [id number] Left 1 Implanted  LINER NEUTRAL 54X36MM PLUS 4 - [id number] Hips LINER NEUTRAL 54X36MM PLUS 4  DEPU[employer]TH[employer] [id number] Left 1 Implanted  STEM FEM ACTIS STD SZ4 - [id number] Stem STEM FEM ACTIS STD SZ4  [employer] [id number] Left 1 Implanted  HEAD CERAMIC DELTA 36 PLUS 1.5 - [id number] Hips HEAD CERAMIC DELTA 36 PLUS 1.5  [employer] [id number] Left 1 Implanted   Surgeon: Lonni GRADE. Vernetta, MD Assistant: Tory Gaskins, PA-C  Anesthesia: #1 attempted spinal, #2 General EBL: 350 cc Antibiotics: IV Ancef  Complications: None  Indications: The patient is a 63 year old female well-known to us .  She has debilitating arthritis involving her left hip.  We actually replaced her right hip secondary arthritis a few years ago and that is done well.  At this point her left hip pain is daily and it is detrimentally affecting her mobility, her quality of life and her actives of daily living.  Her x-rays also show significant cartilage loss and wear on the left hip.  She does wish to proceed with a hip replacement and understands fully the risks of acute blood loss anemia, nerve or vessel injury, fracture, infection, DVT, dislocation, implant failure, leg length differences and wound healing issues.  She understands that our goals are hopefully decreased pain, improved mobility and improve quality of life.  Procedure description: After informed consent was obtained and the appropriate left hip was marked, the patient was brought to the operating room and set up on the stretcher where spinal anesthesia was placed.  She was  next placed supine on the Hana fracture table with a perineal post and placed in both legs in inline skeletal traction boots and no traction applied.  A Foley catheter been placed as well.  We did test the skin with pinching her with forceps and she could feel that completely so general anesthesia was obtained.  The left operative hip and pelvis were assessed radiographically.  The left hip was prepped and draped with DuraPrep and sterile drapes.  A timeout was called and she was notified as correct patient correct left hip.  An incision was made just inferior and posterior to the ASIS and carried slightly obliquely down the leg.  Dissection was carried down to the tensor fascia lata muscle and the tensor fascia was then divided longitudinally to proceed with a direct interposed the hip.  Circumflex vessels were identified and cauterized.  The hip capsule identified and opened up in L-type format.  Cobra retractors were placed around the medial and lateral femoral neck and a femoral neck cut was made with an oscillating saw just proximal to the lesser trochanter and this cut was completed with an osteotome.  A corkscrew guide was placed in the femoral head and the femoral head was removed in its entirety and found to have a wide area devoid of cartilage.  A bent Hohmann was then placed over the medial acetabular rim and remnants of the acetabular labrum and other debris removed.  Reaming was then initiated from a size 43 reamer and stepwise increments going up to a size 51 reamer with all reamers placed on direct visualization a last reamer also placed under  direct fluoroscopy in order to obtain the depth reaming, the inclination and anteversion.  We did try to place a 52 size acetabular component and we could not get that disc sit fully so we reamed to a 52 for line the line reaming and that still did not fit.  We then opened up a size 54 acetabular component and placed that within the acetabular under direct  visualization and fluoroscopy and were pleased with the fit of the 54 I started component.  We then went with a 36+4 polythene liner.  Attention was then turned the femur.  With the left leg externally rotated to 120 degrees, extended and adducted, a Mueller retractor was placed medially and a Hohmann tractor behind the greater trochanter.  The lateral joint capsule was released and a box cutting osteotome was used in the femoral canal.  Broaching was initiated using the Actis broaching system from a size 0 going to a size 4.  We then trialed a standard offset femoral neck and a 36+1.5 trial head ball.  The left leg was brought over and up and with traction and internal rotation reduced in the pelvis.  Based on radiographic and clinical assessment we replaced.  We dislocated the hip and the trial components.  We then placed the real Actis femoral component with standard offset size 4 and with the real 36+1.5 ceramic head ball.  Again this was within the pelvis and we assessed her radiographically and clinically and we had increased her leg length and it was stable on exam.  We assessed her radiographically and clinically.  The soft tissue was then irrigated with normal saline solution.  Remnants of the joint capsule were closed with interrupted #1 Ethibond suture followed by #1 Vicryl close tensor fascia.  0 Vicryl is used to close deep tissue and 2-0 Vicryl was used to close subcutaneous tissue.  The skin was closed with staples.  Well-padded sterile dressing was applied.  The patient was taken off on the table, awakened, extubated and taken recovery room.  Tory Gaskins, PA-C did assist in the interrogation beginning and his assistance was crucial and medically necessary for soft tissue management and retraction, helping guide implant placement and a layered closure of the wound.

## 2024-07-21 NOTE — Anesthesia Procedure Notes (Signed)
 Spinal  Patient location during procedure: OR Start time: 07/21/2024 10:10 AM End time: 07/21/2024 10:14 AM Reason for block: surgical anesthesia Staffing Performed: anesthesiologist  Anesthesiologist: Jerrye Sharper, MD Performed by: Jerrye Sharper, MD Authorized by: Jerrye Sharper, MD   Preanesthetic Checklist Completed: patient identified, IV checked, site marked, risks and benefits discussed, surgical consent, monitors and equipment checked, pre-op evaluation and timeout performed Spinal Block Patient position: sitting Prep: DuraPrep and site prepped and draped Patient monitoring: heart rate, cardiac monitor, continuous pulse ox and blood pressure Approach: midline Location: L3-4 Injection technique: single-shot Needle Needle type: Pencan  Needle gauge: 24 G Needle length: 9 cm Needle insertion depth: 7 cm Assessment Events: CSF return Additional Notes Patient tolerated procedure well. Inadequate block obtain, will convert to GA with LMA.

## 2024-07-21 NOTE — Anesthesia Preprocedure Evaluation (Signed)
 Anesthesia Evaluation  Patient identified by MRN, date of birth, ID band Patient awake    Reviewed: Allergy & Precautions, NPO status , Patient's Chart, lab work & pertinent test results  History of Anesthesia Complications (+) history of anesthetic complications (anxiety)  Airway Mallampati: II  TM Distance: >3 FB Neck ROM: Full    Dental no notable dental hx. (+) Teeth Intact, Dental Advisory Given, Missing,    Pulmonary sleep apnea and Continuous Positive Airway Pressure Ventilation , former smoker   Pulmonary exam normal breath sounds clear to auscultation       Cardiovascular hypertension, Pt. on medications +CHF (grade 2 diastolic dysfunction)  Normal cardiovascular exam Rhythm:Regular Rate:Normal  Last echo 1. Left ventricular ejection fraction, by estimation, is 70 to 75%. The  left ventricle has hyperdynamic function. The left ventricle has no  regional wall motion abnormalities. There is mild concentric left  ventricular hypertrophy. Left ventricular  diastolic parameters are consistent with Grade II diastolic dysfunction  (pseudonormalization). Elevated left atrial pressure.  2. Right ventricular systolic function is normal. The right ventricular  size is normal.  3. The mitral valve is degenerative. No evidence of mitral valve  regurgitation. No evidence of mitral stenosis.  4. The aortic valve is normal in structure. Aortic valve regurgitation is  not visualized. No aortic stenosis is present.     BP 190/90 in preop, does not take BP meds. States she has never been this high in the past, has never been on BP meds before    Neuro/Psych  PSYCHIATRIC DISORDERS Anxiety Depression  Schizophrenia  CVA (06/2020), No Residual Symptoms    GI/Hepatic negative GI ROS, Neg liver ROS,,,  Endo/Other  diabetes, Well Controlled, Type 2, Insulin  Dependent, Oral Hypoglycemic AgentsHypothyroidism  Obesity BMI 37    Renal/GU negative Renal ROS  negative genitourinary   Musculoskeletal  (+) Arthritis , Osteoarthritis,  OA right hip   Abdominal  (+) + obese  Peds  Hematology negative hematology ROS (+) hct 41.1, plt 275   Anesthesia Other Findings   Reproductive/Obstetrics negative OB ROS                              Anesthesia Physical Anesthesia Plan  ASA: III  Anesthesia Plan: Spinal and MAC   Post-op Pain Management:    Induction:   PONV Risk Score and Plan: 2 and Propofol  infusion and TIVA  Airway Management Planned: Natural Airway and Nasal Cannula  Additional Equipment: None  Intra-op Plan:   Post-operative Plan:   Informed Consent: I have reviewed the patients History and Physical, chart, labs and discussed the procedure including the risks, benefits and alternatives for the proposed anesthesia with the patient or authorized representative who has indicated his/her understanding and acceptance.       Plan Discussed with: CRNA  Anesthesia Plan Comments:          Anesthesia Quick Evaluation

## 2024-07-21 NOTE — Anesthesia Procedure Notes (Addendum)
 Procedure Name: LMA Insertion Date/Time: 07/21/2024 10:33 AM  Performed by: Jolynn Mage, CRNAPre-anesthesia Checklist: Patient identified, Emergency Drugs available, Suction available, Timeout performed and Patient being monitored Patient Re-evaluated:Patient Re-evaluated prior to induction Oxygen Delivery Method: Circle system utilized Preoxygenation: Pre-oxygenation with 100% oxygen Induction Type: IV induction Ventilation: Mask ventilation without difficulty LMA: LMA inserted LMA Size: 4.0 Number of attempts: 1 Dental Injury: Teeth and Oropharynx as per pre-operative assessment

## 2024-07-21 NOTE — Interval H&P Note (Signed)
 History and Physical Interval Note: The patient understands the that she is here today for a left total hip replacement to treat her significant left hip pain and arthritis.  There has been no acute or interval change in her medical status.  The risks and benefits of surgery have been discussed in detail and informed consent has been obtained.  The left operative hip has been marked.  07/21/2024 8:54 AM  [name] [name]  has presented today for surgery, with the diagnosis of left hip osteoarthritis.  The various methods of treatment have been discussed with the patient and family. After consideration of risks, benefits and other options for treatment, the patient has consented to  Procedure(s): ARTHROPLASTY, HIP, TOTAL, ANTERIOR APPROACH (Left) as a surgical intervention.  The patient's history has been reviewed, patient examined, no change in status, stable for surgery.  I have reviewed the patient's chart and labs.  Questions were answered to the patient's satisfaction.     [name]

## 2024-07-21 NOTE — Progress Notes (Signed)
Placed patient on home CPAP for the night  

## 2024-07-21 NOTE — Transfer of Care (Signed)
 Immediate Anesthesia Transfer of Care Note  Patient: [name] [name]  Procedure(s) Performed: ARTHROPLASTY, LEFT HIP, TOTAL, ANTERIOR APPROACH (Left: Hip)  Patient Location: PACU  Anesthesia Type:General  Level of Consciousness: awake, alert , patient cooperative, and responds to stimulation  Airway & Oxygen Therapy: Patient Spontanous Breathing and Patient connected to nasal cannula oxygen  Post-op Assessment: Report given to RN, Post -op Vital signs reviewed and stable, and Patient moving all extremities X 4  Post vital signs: Reviewed and stable  Last Vitals:  Vitals Value Taken Time  BP 138/70 07/21/24 12:10  Temp 36.4 C 07/21/24 12:10  Pulse 73 07/21/24 12:15  Resp 16 07/21/24 12:15  SpO2 92 % 07/21/24 12:15  Vitals shown include unfiled device data.  Last Pain:  Vitals:   07/21/24 0837  TempSrc:   PainSc: 5          Complications: No notable events documented.

## 2024-07-21 NOTE — Anesthesia Postprocedure Evaluation (Signed)
 Anesthesia Post Note  Patient: [name] [name]  Procedure(s) Performed: ARTHROPLASTY, LEFT HIP, TOTAL, ANTERIOR APPROACH (Left: Hip)     Patient location during evaluation: PACU Anesthesia Type: General Level of consciousness: awake and alert and oriented Pain management: pain level controlled Vital Signs Assessment: post-procedure vital signs reviewed and stable Respiratory status: spontaneous breathing, nonlabored ventilation and respiratory function stable Cardiovascular status: blood pressure returned to baseline and stable Postop Assessment: no apparent nausea or vomiting Anesthetic complications: no   No notable events documented.  Last Vitals:  Vitals:   07/21/24 1245 07/21/24 1300  BP: 126/75 134/71  Pulse: 63 62  Resp: 11 12  Temp:    SpO2: 99% 96%    Last Pain:  Vitals:   07/21/24 1245  TempSrc:   PainSc: 10-Worst pain ever                 Lakeyia Surber A.

## 2024-07-22 ENCOUNTER — Encounter (HOSPITAL_COMMUNITY): Payer: Self-pay | Admitting: Orthopaedic Surgery

## 2024-07-22 DIAGNOSIS — M1612 Unilateral primary osteoarthritis, left hip: Secondary | ICD-10-CM | POA: Diagnosis not present

## 2024-07-22 LAB — BASIC METABOLIC PANEL WITH GFR
Anion gap: 11 (ref 5–15)
BUN: 14 mg/dL (ref 8–23)
CO2: 22 mmol/L (ref 22–32)
Calcium: 8.7 mg/dL — ABNORMAL LOW (ref 8.9–10.3)
Chloride: 104 mmol/L (ref 98–111)
Creatinine, Ser: 0.88 mg/dL (ref 0.44–1.00)
GFR, Estimated: >60 mL/min (ref >60)
Glucose, Bld: 137 mg/dL — ABNORMAL HIGH (ref 70–99)
Potassium: 4.5 mmol/L (ref 3.5–5.1)
Sodium: 137 mmol/L (ref 135–145)

## 2024-07-22 LAB — CBC
HCT: 38.4 % (ref 36.0–46.0)
Hemoglobin: 12.6 g/dL (ref 12.0–15.0)
MCH: 31 pg (ref 26.0–34.0)
MCHC: 32.8 g/dL (ref 30.0–36.0)
MCV: 94.3 fL (ref 80.0–100.0)
Platelets: 185 K/uL (ref 150–400)
RBC: 4.07 MIL/uL (ref 3.87–5.11)
RDW: 12.8 % (ref 11.5–15.5)
WBC: 7.9 K/uL (ref 4.0–10.5)
nRBC: 0 % (ref 0.0–0.2)

## 2024-07-22 LAB — GLUCOSE, CAPILLARY: Glucose-Capillary: 151 mg/dL — ABNORMAL HIGH (ref 70–99)

## 2024-07-22 MED ORDER — SODIUM CHLORIDE 0.9 % IV BOLUS
500.0000 mL | Freq: Once | INTRAVENOUS | Status: AC
  Administered 2024-07-22: 500 mL via INTRAVENOUS

## 2024-07-22 MED ORDER — SODIUM CHLORIDE 0.9% FLUSH: 10.0000 mL | INTRAVENOUS | Status: DC | PRN

## 2024-07-22 NOTE — Care Management Obs Status (Signed)
 MEDICARE OBSERVATION STATUS NOTIFICATION   Patient Details  Name: [name] [name] MRN: [id number] Date of Birth: 26-May-1961   Medicare Observation Status Notification Given:  Yes    Bridget Cordella Simmonds, LCSW 07/22/2024, 11:59 AM

## 2024-07-22 NOTE — Evaluation (Signed)
 Physical Therapy Evaluation Patient Details Name: [name] [name] MRN: [id number] DOB: April 17, 1961 Today's Date: 07/22/2024  History of Present Illness  63 y.o. female presents to [facility] on 07/21/2024 for L THA. PMH includes R THA, tansaminitis, DMII, CVA, schizoaffective disorder, HLD, HTN.  Clinical Impression  Pt admitted with above diagnosis. PTA, pt was independent with functional mobility, ADLs, IADLs, and driving. She lives with her husband in a one story house with 4 STE. Pt currently with functional limitations due to the deficits listed below (see PT Problem List). She required CGA-minA for bed mobility, CGA for transfers using RW, and CGA for gait using RW. Pt ambulated a household distance, but was limited by LLE pain. Pt will benefit from acute skilled PT to increase her independence and safety with mobility to allow discharge. Plan to advance gait and introduce stair training in future sessions.    If plan is discharge home, recommend the following: A little help with walking and/or transfers;A little help with bathing/dressing/bathroom;Assistance with cooking/housework;Assist for transportation;Help with stairs or ramp for entrance   Can travel by private vehicle        Equipment Recommendations None recommended by PT  Recommendations for Other Services       Functional Status Assessment Patient has had a recent decline in their functional status and demonstrates the ability to make significant improvements in function in a reasonable and predictable amount of time.     Precautions / Restrictions Precautions Precautions: Fall Recall of Precautions/Restrictions: Intact Precaution/Restrictions Comments: Direct Anterior Approach, No hip precautions Restrictions Weight Bearing Restrictions Per Provider Order: Yes LLE Weight Bearing Per Provider Order: Weight bearing as tolerated      Mobility  Bed Mobility Overal bed mobility: Needs Assistance Bed Mobility:  Supine to Sit     Supine to sit: Contact guard, Min assist, HOB elevated, Used rails     General bed mobility comments: Pt sat up on R side of bed with increased time. Cues for sequencing. Assist to manage RLE. Pt pushed up with use of bed rail.    Transfers Overall transfer level: Needs assistance Equipment used: Rolling walker (2 wheels) Transfers: Sit to/from Stand, Bed to chair/wheelchair/BSC Sit to Stand: Contact guard assist   Step pivot transfers: Contact guard assist       General transfer comment: Pt stood from lowest bed height. Cued proper hand placement using RW. Powered up with CGA. Transferred to recliner chair. Good eccentric control.    Ambulation/Gait Ambulation/Gait assistance: Contact guard assist Gait Distance (Feet): 50 Feet Assistive device: Rolling walker (2 wheels) Gait Pattern/deviations: Step-to pattern, Step-through pattern, Decreased stride length, Decreased weight shift to left, Antalgic Gait velocity: decreased Gait velocity interpretation: <1.8 ft/sec, indicate of risk for recurrent falls   General Gait Details: Pt ambulated with short slow steps. She demonstrated heel strike bilaterally, limited push off on LLE, and decrease hip flex on LLE. Pt had adequate foot clearence. Cued offloading of LEs by increasing WBing through BUE support on RW. Pt progressed from step-to advancing to step-through with increased time. Distance limited d/t pain.  Stairs            Wheelchair Mobility     Tilt Bed    Modified Rankin (Stroke Patients Only)       Balance Overall balance assessment: Needs assistance Sitting-balance support: No upper extremity supported, Feet supported Sitting balance-Leahy Scale: Fair     Standing balance support: Bilateral upper extremity supported, During functional activity, Reliant on assistive device for  balance Standing balance-Leahy Scale: Poor                               Pertinent Vitals/Pain  Pain Assessment Pain Assessment: 0-10 Pain Score: 7  Pain Location: L hip Pain Descriptors / Indicators: Operative site guarding, Discomfort, Shooting, Burning Pain Intervention(s): Monitored during session, Limited activity within patient's tolerance, Repositioned, Patient requesting pain meds-RN notified    Home Living Family/patient expects to be discharged to:: Private residence Living Arrangements: Spouse/significant other Available Help at Discharge: Family;Available 24 hours/day (Husband is 89, can likely assist depending on the needs) Type of Home: House Home Access: Stairs to enter Entrance Stairs-Rails: Right;Left;Can reach both Entrance Stairs-Number of Steps: 4   Home Layout: One level Home Equipment: Agricultural consultant (2 wheels);Cane - single point;Hand held shower head      Prior Function Prior Level of Function : Independent/Modified Independent;Driving             Mobility Comments: Ambulates without AD. Fell down the steps 2 weeks ago. ADLs Comments: Indep with ADLs/IADLs. Drives.     Extremity/Trunk Assessment   Upper Extremity Assessment Upper Extremity Assessment: Overall WFL for tasks assessed;Right hand dominant    Lower Extremity Assessment Lower Extremity Assessment: LLE deficits/detail LLE Deficits / Details: Pt POD 1 s/p THA. Limited hip AROM and strength. Grossly 2/5 hip strength. Knee and Ankle AROM/strength [facility]. LLE: Unable to fully assess due to pain LLE Sensation: decreased proprioception LLE Coordination: decreased gross motor    Cervical / Trunk Assessment Cervical / Trunk Assessment: Normal  Communication   Communication Communication: No apparent difficulties    Cognition Arousal: Alert Behavior During Therapy: WFL for tasks assessed/performed   PT - Cognitive impairments: No apparent impairments                       PT - Cognition Comments: Pt A,Ox4 Following commands: Intact       Cueing Cueing Techniques:  Verbal cues, Gestural cues     General Comments General comments (skin integrity, edema, etc.): VSS on RA    Exercises Total Joint Exercises Ankle Circles/Pumps: Supine, Both, AROM, 10 reps Long Arc Quad: Seated, Both, AROM, 5 reps Other Exercises Other Exercises: Provided pt with L THA HEP handout. Reviewed each exercise with pt.   Assessment/Plan    PT Assessment Patient needs continued PT services  PT Problem List Decreased strength;Decreased range of motion;Decreased activity tolerance;Decreased balance;Decreased mobility;Pain       PT Treatment Interventions DME instruction;Gait training;Stair training;Functional mobility training;Therapeutic activities;Therapeutic exercise;Balance training;Patient/family education    PT Goals (Current goals can be found in the Care Plan section)  Acute Rehab PT Goals Patient Stated Goal: Return Home PT Goal Formulation: With patient Time For Goal Achievement: 08/05/24 Potential to Achieve Goals: Good    Frequency 7X/week     Co-evaluation               AM-PAC PT 6 Clicks Mobility  Outcome Measure Help needed turning from your back to your side while in a flat bed without using bedrails?: A Little Help needed moving from lying on your back to sitting on the side of a flat bed without using bedrails?: A Little Help needed moving to and from a bed to a chair (including a wheelchair)?: A Little Help needed standing up from a chair using your arms (e.g., wheelchair or bedside chair)?: A Little Help needed to walk  in hospital room?: A Little Help needed climbing 3-5 steps with a railing? : A Lot 6 Click Score: 17    End of Session Equipment Utilized During Treatment: Gait belt Activity Tolerance: Patient tolerated treatment well;Patient limited by pain Patient left: in chair;with call bell/phone within reach;with chair alarm set Nurse Communication: Mobility status;Patient requests pain meds PT Visit Diagnosis: Difficulty in  walking, not elsewhere classified (R26.2);Other abnormalities of gait and mobility (R26.89);Unsteadiness on feet (R26.81)    Time: 9251-9174 PT Time Calculation (min) (ACUTE ONLY): 37 min   Charges:   PT Evaluation $PT Eval Moderate Complexity: 1 Mod PT Treatments $Therapeutic Activity: 8-22 mins PT General Charges $$ ACUTE PT VISIT: 1 Visit         Randall SAUNDERS, PT, DPT Acute Rehabilitation Services Office: [phone] Secure Chat Preferred  [name] [name] 07/22/2024, 8:48 AM

## 2024-07-22 NOTE — Plan of Care (Signed)

## 2024-07-22 NOTE — Discharge Instructions (Signed)

## 2024-07-22 NOTE — Progress Notes (Signed)
 Pt given robaxin  prn for reports of back pain, within 10 minutes pt reports dizziness and feeling very hot and like I'm going to pass out. Pt noted to be diaphoretic, sitting up along side of bed. Assisted to supine position in bed, vitals checked, BP-96/46 HR-59.  Dr. Georgina, paged through answering service. Pt verbalizes relief within 10 min of being transferred to bed with fan at bedside.

## 2024-07-22 NOTE — Progress Notes (Signed)
 Subjective: 1 Day Post-Op Procedure(s) (LRB): ARTHROPLASTY, LEFT HIP, TOTAL, ANTERIOR APPROACH (Left) Patient reports pain as moderate.    Objective: Vital signs in last 24 hours: Temp:  [97.5 F (36.4 C)-98.8 F (37.1 C)] 98.1 F (36.7 C) (09/10 0327) Pulse Rate:  [58-87] 69 (09/10 0543) Resp:  [9-23] 16 (09/09 1943) BP: (96-187)/(46-105) 117/54 (09/10 0543) SpO2:  [92 %-100 %] 98 % (09/10 0327) Weight:  [93.9 kg] 93.9 kg (09/09 0758)  Intake/Output from previous day: 09/09 0701 - 09/10 0700 In: 2120 [P.O.:720; I.V.:700; IV Piggyback:700] Out: 2950 [Urine:2600; Blood:350] Intake/Output this shift: No intake/output data recorded.  Recent Labs    07/20/24 1100 07/22/24 0026  HGB 15.0 12.6   Recent Labs    07/20/24 1100 07/22/24 0026  WBC 4.6 7.9  RBC 4.86 4.07  HCT 46.4* 38.4  PLT 187 185   Recent Labs    07/20/24 1100 07/22/24 0026  NA 140 137  K 3.8 4.5  CL 108 104  CO2 23 22  BUN 15 14  CREATININE 0.61 0.88  GLUCOSE 127* 137*  CALCIUM  9.2 8.7*   No results for input(s): LABPT, INR in the last 72 hours.  Sensation intact distally Intact pulses distally Dorsiflexion/Plantar flexion intact Incision: scant drainage   Assessment/Plan: 1 Day Post-Op Procedure(s) (LRB): ARTHROPLASTY, LEFT HIP, TOTAL, ANTERIOR APPROACH (Left) Up with therapy      [name] 07/22/2024, 7:09 AM

## 2024-07-22 NOTE — Progress Notes (Signed)
 Foley catheter discontinued. Pt due to void

## 2024-07-22 NOTE — Progress Notes (Signed)
 Physical Therapy Treatment Patient Details Name: [name] [name] MRN: [id number] DOB: 11/25/1960 Today's Date: 07/22/2024   History of Present Illness 63 y.o. female presents to [facility] hospital on 07/21/2024 for L THA. PMH includes R THA, tansaminitis, DMII, CVA, schizoaffective disorder, HLD, HTN.    PT Comments  Today's session focused on gait training. Pt increased gait distance, ambulating ~248ft using RW with CGA. She took three intermittent standing rest breaks. Pt demonstrated a step-through gait pattern. She was receptive to cues for improved technique. Reviewed L THA HEP handout. Encouraged frequent mobilization with nursing staff. Patient needs to practice stairs next session.        If plan is discharge home, recommend the following: A little help with walking and/or transfers;A little help with bathing/dressing/bathroom;Assistance with cooking/housework;Assist for transportation;Help with stairs or ramp for entrance   Can travel by private vehicle        Equipment Recommendations  None recommended by PT    Recommendations for Other Services       Precautions / Restrictions Precautions Precautions: Fall Recall of Precautions/Restrictions: Intact Precaution/Restrictions Comments: Direct Anterior Approach, No hip precautions Restrictions Weight Bearing Restrictions Per Provider Order: Yes LLE Weight Bearing Per Provider Order: Weight bearing as tolerated     Mobility  Bed Mobility Overal bed mobility: Needs Assistance Bed Mobility: Supine to Sit     Supine to sit: HOB elevated, Used rails, Supervision     General bed mobility comments: Pt took increased time to sit up on R side of bed. Pt managed LLE by hooking leg with RLE and/or use of arms. Demonstrated use of gait belt to assist with moving LE.    Transfers Overall transfer level: Needs assistance Equipment used: Rolling walker (2 wheels) Transfers: Sit to/from Stand, Bed to chair/wheelchair/BSC Sit to  Stand: Contact guard assist   Step pivot transfers: Contact guard assist       General transfer comment: Pt demonstrated proper hand placement using RW. She powered up with light assist. Good eccentric control with sitting.    Ambulation/Gait Ambulation/Gait assistance: Contact guard assist Gait Distance (Feet): 200 Feet (3 intermittent standing rest breaks) Assistive device: Rolling walker (2 wheels) Gait Pattern/deviations: Step-through pattern, Decreased stride length, Decreased stance time - left Gait velocity: decreased Gait velocity interpretation: <1.8 ft/sec, indicate of risk for recurrent falls   General Gait Details: Pt ambulated with a reciprocal gait pattern. Limited foot clearence, cued increased L knee flex to achieve adequate foot clearence. Pt demonstrated heel strike bilat, limited push off. She maintained upright posture, good proximity to RW, and navigated room/hallway well. No LOB.   Stairs             Wheelchair Mobility     Tilt Bed    Modified Rankin (Stroke Patients Only)       Balance Overall balance assessment: Needs assistance Sitting-balance support: No upper extremity supported, Feet supported Sitting balance-Leahy Scale: Fair     Standing balance support: Bilateral upper extremity supported, During functional activity, Reliant on assistive device for balance Standing balance-Leahy Scale: Poor                              Communication Communication Communication: No apparent difficulties  Cognition Arousal: Alert Behavior During Therapy: WFL for tasks assessed/performed   PT - Cognitive impairments: No apparent impairments  Following commands: Intact      Cueing Cueing Techniques: Verbal cues, Tactile cues  Exercises      General Comments General comments (skin integrity, edema, etc.): VSS on RA      Pertinent Vitals/Pain Pain Assessment Pain Assessment: Faces Faces Pain  Scale: Hurts little more Pain Location: L hip Pain Descriptors / Indicators: Discomfort, Aching, Burning Pain Intervention(s): Monitored during session, Limited activity within patient's tolerance, Repositioned, Ice applied    Home Living Family/patient expects to be discharged to:: Private residence Living Arrangements: Spouse/significant other                      Prior Function            PT Goals (current goals can now be found in the care plan section) Acute Rehab PT Goals Patient Stated Goal: Regain my independence Progress towards PT goals: Progressing toward goals    Frequency    7X/week      PT Plan      Co-evaluation              AM-PAC PT 6 Clicks Mobility   Outcome Measure  Help needed turning from your back to your side while in a flat bed without using bedrails?: A Little Help needed moving from lying on your back to sitting on the side of a flat bed without using bedrails?: A Little Help needed moving to and from a bed to a chair (including a wheelchair)?: A Little Help needed standing up from a chair using your arms (e.g., wheelchair or bedside chair)?: A Little Help needed to walk in hospital room?: A Little Help needed climbing 3-5 steps with a railing? : A Lot 6 Click Score: 17    End of Session Equipment Utilized During Treatment: Gait belt Activity Tolerance: Patient tolerated treatment well Patient left: in chair;with call bell/phone within reach;with chair alarm set;with family/visitor present Nurse Communication: Mobility status PT Visit Diagnosis: Difficulty in walking, not elsewhere classified (R26.2);Other abnormalities of gait and mobility (R26.89);Unsteadiness on feet (R26.81)     Time: 8494-8470 PT Time Calculation (min) (ACUTE ONLY): 24 min  Charges:    $Gait Training: 23-37 mins PT General Charges $$ ACUTE PT VISIT: 1 Visit                     [name] [name], PT, DPT Acute Rehabilitation Services Office:  [phone] Secure Chat Preferred  [name] [name] 07/22/2024, 3:50 PM

## 2024-07-22 NOTE — Progress Notes (Signed)
 NS 500 ml bolus given x1 per Dr. Georgina, recheck BP-117/54. Pt asymptomatic at this time.

## 2024-07-23 DIAGNOSIS — M1612 Unilateral primary osteoarthritis, left hip: Secondary | ICD-10-CM | POA: Diagnosis not present

## 2024-07-23 MED ORDER — ASPIRIN 81 MG PO CHEW: 81.0000 mg | CHEWABLE_TABLET | Freq: Two times a day (BID) | ORAL | 0 refills | Status: AC

## 2024-07-23 MED ORDER — TIZANIDINE HCL 4 MG PO TABS: 4.0000 mg | ORAL_TABLET | Freq: Four times a day (QID) | ORAL | Status: DC | PRN

## 2024-07-23 MED ORDER — OXYCODONE HCL 5 MG PO TABS: 5.0000 mg | ORAL_TABLET | Freq: Four times a day (QID) | ORAL | 0 refills | Status: DC | PRN

## 2024-07-23 NOTE — Plan of Care (Signed)

## 2024-07-23 NOTE — TOC Transition Note (Signed)
 Transition of Care [facility]) - Discharge Note   Patient Details  Name: [name] [name] MRN: [id number] Date of Birth: Jul 15, 1961  [facility] CM/SW Contact:  Rosalva Jon Bloch, RN Phone Number: 07/23/2024, 11:06 AM   Clinical Narrative:    Patient will DC to: home Anticipated DC date: 07/23/2024 Family notified: yes Transport by: car             -s/p L THA , 9/9 Per MD patient ready for DC today. RN, patient, patient's  husband and [facility] notified of DC. Pt without DME needs.  Post hospital f/u noted on AVS. Pt without RX med concerns. Husband to provide transportation to home.  RNCM will sign off for now as intervention is no longer needed. Please consult us  again if new needs arise.    Final next level of care: Home w Home Health Services Barriers to Discharge: No Barriers Identified   Patient Goals and CMS Choice            Discharge Placement                       Discharge Plan and Services Additional resources added to the After Visit Summary for                  DME Arranged: Walker rolling DME Agency: [employer] Date DME Agency Contacted: 07/22/24 Time DME Agency Contacted: [phone] Representative spoke with at DME Agency: Darlyn HH Arranged: PT HH Agency: [facility] (prearranged by provider's office) Date HH Agency Contacted: 07/22/24 Time HH Agency Contacted: [phone]    Social Drivers of Health (SDOH) Interventions SDOH Screenings   Food Insecurity: No Food Insecurity (07/22/2024)  Housing: Low Risk  (07/22/2024)  Transportation Needs: No Transportation Needs (07/22/2024)  Utilities: Not At Risk (07/22/2024)  Depression (PHQ2-9): Low Risk  (03/01/2021)  Tobacco Use: Medium Risk (07/21/2024)  Health Literacy: Low Risk  (04/12/2020)   Received from [facility]     Readmission Risk Interventions     No data to display

## 2024-07-23 NOTE — Plan of Care (Signed)
  Problem: Education: Goal: Knowledge of General Education information will improve Description: Including pain rating scale, medication(s)/side effects and non-pharmacologic comfort measures 07/23/2024 0700 by Ann Axel HERO, RN Outcome: Progressing 07/23/2024 0700 by Ann Axel HERO, RN Outcome: Progressing 07/23/2024 0700 by Ann Axel HERO, RN Outcome: Progressing   Problem: Health Behavior/Discharge Planning: Goal: Ability to manage health-related needs will improve 07/23/2024 0700 by Ann Axel HERO, RN Outcome: Progressing 07/23/2024 0700 by Ann Axel HERO, RN Outcome: Progressing 07/23/2024 0700 by Ann Axel HERO, RN Outcome: Progressing   Problem: Clinical Measurements: Goal: Ability to maintain clinical measurements within normal limits will improve 07/23/2024 0700 by Ann Axel HERO, RN Outcome: Progressing 07/23/2024 0700 by Ann Axel HERO, RN Outcome: Progressing 07/23/2024 0700 by Ann Axel HERO, RN Outcome: Progressing

## 2024-07-23 NOTE — Plan of Care (Signed)
  Problem: Education: Goal: Knowledge of General Education information will improve Description: Including pain rating scale, medication(s)/side effects and non-pharmacologic comfort measures 07/23/2024 0700 by Ann Axel HERO, RN Outcome: Progressing 07/23/2024 0700 by Ann Axel HERO, RN Outcome: Progressing   Problem: Health Behavior/Discharge Planning: Goal: Ability to manage health-related needs will improve 07/23/2024 0700 by Ann Axel HERO, RN Outcome: Progressing 07/23/2024 0700 by Ann Axel HERO, RN Outcome: Progressing   Problem: Clinical Measurements: Goal: Ability to maintain clinical measurements within normal limits will improve 07/23/2024 0700 by Ann Axel HERO, RN Outcome: Progressing 07/23/2024 0700 by Ann Axel HERO, RN Outcome: Progressing

## 2024-07-23 NOTE — Discharge Summary (Signed)
 Patient ID: [name] [name] MRN: [id number] DOB/AGE: 11-18-1960 63 y.o.  Admit date: 07/21/2024 Discharge date: 07/23/2024  Admission Diagnoses:  Principal Problem:   Unilateral primary osteoarthritis, left hip Active Problems:   Status post total replacement of left hip   Discharge Diagnoses:  Same  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 female who was admitted 07/21/2024 for operative treatment ofUnilateral primary osteoarthritis, left hip. Patient has severe unremitting pain that affects sleep, daily activities, and work/hobbies. After pre-op clearance the patient was taken to the operating room on 07/21/2024 and underwent  Procedure(s): ARTHROPLASTY, LEFT HIP, TOTAL, ANTERIOR APPROACH.    Patient was given perioperative antibiotics:  Anti-infectives (From admission, onward)    Start     Dose/Rate Route Frequency Ordered Stop   07/21/24 2000  ceFAZolin  (ANCEF ) IVPB 2g/100 mL premix        2 g 200 mL/hr over 30 Minutes Intravenous Every 6 hours 07/21/24 1818 07/22/24 0750   07/21/24 0800  ceFAZolin  (ANCEF ) IVPB 2g/100 mL premix        2 g 200 mL/hr over 30 Minutes Intravenous On call to O.R. 07/21/24  0758 07/21/24 1045        Patient was given sequential compression devices, early ambulation, and chemoprophylaxis to prevent DVT.  Inpatient Morphine Milligram Equivalents Per Day 9/9 - 9/11   Values displayed are in units of MME/Day    Order Start / End Date 9/9 Yesterday Today    oxyCODONE  (Oxy IR/ROXICODONE ) immediate release tablet 5 mg 9/9 - 9/9 0 of Unknown -- --    oxyCODONE  (ROXICODONE ) 5 MG/5ML solution 5 mg 9/9 - 9/9 0 of Unknown -- --      Group total: 0 of Unknown      HYDROmorphone  (DILAUDID ) injection 0.25-0.5 mg 9/9 - 9/9 10 of 40-80 -- --    fentaNYL  citrate (PF) (SUBLIMAZE ) injection 9/9 - 9/9 *52.5 of 52.5 -- --    HYDROmorphone  (DILAUDID ) injection 0.5-1 mg 9/9 - No end date 0 of 20-40 0 of 60-120 0 of 60-120    oxyCODONE  (Oxy IR/ROXICODONE ) immediate release tablet 5-10 mg 9/9 - No end date 0 of 15-30 15 of 45-90 0 of 45-90    oxyCODONE  (Oxy IR/ROXICODONE ) immediate release tablet 10-15 mg 9/9 - No end date 45 of 30-45 22.5 of 90-135 0 of 90-135    Daily Totals  * 107.5 of Unknown (at least 157.5-247.5) 37.5 of 195-345 0 of 195-345  *One-Step medication  Calculation Errors     Order Type Date Details   oxyCODONE  (Oxy IR/ROXICODONE ) immediate release tablet 5 mg Ordered Dose -- Insufficient frequency information   oxyCODONE  (ROXICODONE ) 5 MG/5ML solution 5 mg Ordered Dose -- Insufficient frequency information            Patient benefited maximally from hospital stay and there were no complications.    Recent vital signs: Patient Vitals for the past 24 hrs:  BP Temp Temp src Pulse Resp SpO2  07/23/24 0829 (!) 170/75 97.8 F (36.6 C) -- 95 17 95 %  07/23/24 0345 -- -- Oral 99 -- 98 %  07/23/24 0300 (!) 142/88 99.6 F (37.6 C) -- -- -- --  07/22/24 2009 (!) 163/67 100.1 F (37.8 C) Oral 100 -- 97 %  07/22/24 1900 (!) 163/67 100.1 F (37.8 C) Oral 100 -- 97 %  07/22/24 1511 129/75 98.5 F (36.9 C) Oral 83 16 100 %  07/22/24 1213 (!) 140/62 98.6 F (37 C)  Oral 73 15 100 %     Recent laboratory studies:  Recent Labs    07/20/24 1100 07/22/24 0026  WBC 4.6 7.9  HGB 15.0 12.6  HCT 46.4* 38.4  PLT 187 185  NA 140 137  K 3.8 4.5  CL 108 104  CO2 23 22  BUN 15 14  CREATININE 0.61 0.88  GLUCOSE 127* 137*  CALCIUM  9.2 8.7*     Discharge Medications:   Allergies as of 07/23/2024       Reactions   Dairy Aid [tilactase] Anaphylaxis, Rash   Metformin  And Related Diarrhea, Nausea Only   Nausea, diarrhea, malaise   Citric Acid Rash        Medication List     TAKE these medications    aspirin  81 MG chewable tablet Chew 1 tablet (81 mg total) by mouth 2 (two) times daily. What changed: when to take this   cholecalciferol  25 MCG (1000 UNIT) tablet Commonly known as: VITAMIN D3 Take 1,000 Units by mouth daily.   fluticasone 50 MCG/ACT nasal spray Commonly known as: FLONASE Place into both nostrils daily.   fluticasone-salmeterol 250-50 MCG/ACT Aepb Commonly known as: ADVAIR Inhale 1 puff into the lungs in the morning and at bedtime.   losartan  100 MG tablet Commonly known as: COZAAR  Take 50 mg by mouth daily.   NON FORMULARY Pt uses a cpap nightly   OneTouch Ultra test strip Generic drug: glucose blood   OVER THE COUNTER MEDICATION Take 25-35 mg by mouth at bedtime. THC nightly chewable tablet   oxyCODONE  5 MG immediate release tablet Commonly known as: Oxy IR/ROXICODONE  Take 1-2 tablets (5-10 mg total) by mouth every 6 (six) hours as needed for moderate pain (pain score 4-6) (pain score 4-6). No more than 6 tablets per day.               Durable Medical Equipment  (From admission, onward)           Start     Ordered   07/21/24 1815  DME Walker rolling  Once       Question Answer Comment  Walker: With 5 Inch Wheels   Patient needs a walker to treat with the following condition Status post total replacement of left hip      07/21/24 1814            Diagnostic Studies: DG Pelvis  Portable Result Date: 07/21/2024 CLINICAL DATA:  Status post left total hip replacement EXAM: PORTABLE PELVIS  1 VIEW COMPARISON:  03/25/2024 FINDINGS: Interval left total hip prosthesis placement with expected gas in the soft tissues. No periprosthetic fracture or acute bony findings identified. Today's view of the pelvis was centered low to include the entirety of both hip implants, and thus the iliac crests are excluded. IMPRESSION: 1. Interval left total hip prosthesis placement, without periprosthetic fracture or acute bony findings. Electronically Signed   By: Ryan Salvage M.D.   On: 07/21/2024 16:39   DG HIP UNILAT WITH PELVIS 1V LEFT Result Date: 07/21/2024 CLINICAL DATA:  Left hip arthroplasty. EXAM: DG HIP (WITH OR WITHOUT PELVIS) 1V*L* COMPARISON:  Pelvic radiograph dated 03/25/2024 FINDINGS: 4 intraoperative fluoroscopic spot images provided. The total fluoroscopic time is 32.7 seconds with a cumulative air kerma 5.24 mGy. Interval placement of a left hip arthroplasty. IMPRESSION: Intraoperative fluoroscopic spot images of left hip arthroplasty. Electronically Signed   By: Vanetta Chou M.D.   On: 07/21/2024 15:26   DG C-Arm 1-60 Min-No Report Result Date: 07/21/2024 Fluoroscopy was utilized by the requesting physician.  No radiographic interpretation.   DG C-Arm 1-60 Min-No Report Result Date: 07/21/2024 Fluoroscopy was utilized by the requesting physician.  No radiographic interpretation.    Disposition: Discharge disposition: 01-Home or Self Care          Follow-up Information     Vernetta Lonni GRADE, MD Follow up in 2 week(s).   Specialty: Orthopedic Surgery Contact information: [address] [place] [address] [phone]         Burney Darice CROME, MD Follow up.   Specialty: Family Medicine Contact information: [address] [place] [address] [phone]         [facility] Follow up.   Specialty: Home Health  Services Why: home health services will be provived by [facility] Contact information: [address] [place] [address] [phone]                  Signed: Lonni GRADE Vernetta 07/23/2024, 9:29 AM

## 2024-07-23 NOTE — Progress Notes (Signed)
 Patient ID: [name] [name], female   DOB: 09-14-1961, 63 y.o.   MRN: [id number] The patient is doing well overall.  She is slightly hypertensive but overall there is no issues.  Her left operative hip is doing well.  The dressing is clean and dry.  She has mobilized well with PT.  She can be discharged home this afternoon.  She will continue to watch her blood pressure as well.

## 2024-07-23 NOTE — Progress Notes (Signed)
 Physical Therapy Treatment Patient Details Name: [name] [name] MRN: [id number] DOB: Jun 06, 1961 Today's Date: 07/23/2024   History of Present Illness 63 y.o. female presents to [facility] hospital on 07/21/2024 for L THA. PMH includes R THA, tansaminitis, DMII, CVA, schizoaffective disorder, HLD, HTN.    PT Comments  Today's session focused on stair training. Educated pt to ascend with RLE and descend with LLE. Demonstrated technique in accordance with her home set up and cued pt for sequencing. She completed 4 steps forwards with B handrails. Discussed how her husband should be positioned to support her. Pt is making good progress towards her acute PT goals demonstrated by advanced functional mobility with decreased physical assistance. Pt feels ready and safe for discharge home. I have answered all her questions related to mobility.       If plan is discharge home, recommend the following: A little help with walking and/or transfers;A little help with bathing/dressing/bathroom;Assistance with cooking/housework;Assist for transportation;Help with stairs or ramp for entrance   Can travel by private vehicle        Equipment Recommendations  None recommended by PT    Recommendations for Other Services       Precautions / Restrictions Precautions Precautions: Fall Recall of Precautions/Restrictions: Intact Precaution/Restrictions Comments: Direct Anterior Approach, No hip precautions Restrictions Weight Bearing Restrictions Per Provider Order: Yes LLE Weight Bearing Per Provider Order: Weight bearing as tolerated     Mobility  Bed Mobility               General bed mobility comments: Not assessed. Pt greeted seated in recliner chair and returned there at end of session.    Transfers Overall transfer level: Needs assistance Equipment used: Rolling walker (2 wheels) Transfers: Sit to/from Stand, Bed to chair/wheelchair/BSC Sit to Stand: Supervision   Step pivot transfers:  Supervision       General transfer comment: Pt stood from recliner chair. She scooted fwd to edge and demonstrated proper hand placement using RW. Powered without physical assist. Good eccentric control with sitting.    Ambulation/Gait Ambulation/Gait assistance: Supervision Gait Distance (Feet): 250 Feet Assistive device: Rolling walker (2 wheels) Gait Pattern/deviations: Step-through pattern, Decreased stride length, Decreased stance time - left Gait velocity: decreased Gait velocity interpretation: <1.8 ft/sec, indicate of risk for recurrent falls   General Gait Details: Pt ambulated with slow steady steps. She demonstrated a step through gait pattern and slightly limited WBing on LLE. Pt navigated room/hallway well. She maintained upright posture and good proximity to RW.   Stairs Stairs: Yes Stairs assistance: Contact guard assist, Supervision Stair Management: Two rails, Forwards, Step to pattern Number of Stairs: 2 (x2) General stair comments: Educated pt to ascend with RLE and descend with LLE. Instructed pt to take one step at at time. She completed two steps in a row twice. BUE support on railings. No LOB. Discussed how her husband should be positioned to support her in accordance with home set-up. Initially provided CGA for safety lessened to Sup on second set.   Wheelchair Mobility     Tilt Bed    Modified Rankin (Stroke Patients Only)       Balance Overall balance assessment: Needs assistance Sitting-balance support: No upper extremity supported, Feet supported Sitting balance-Leahy Scale: Fair     Standing balance support: Bilateral upper extremity supported, During functional activity, Reliant on assistive device for balance Standing balance-Leahy Scale: Poor  Communication Communication Communication: No apparent difficulties  Cognition Arousal: Alert Behavior During Therapy: WFL for tasks assessed/performed    PT - Cognitive impairments: No apparent impairments                         Following commands: Intact      Cueing Cueing Techniques: Verbal cues, Visual cues  Exercises      General Comments General comments (skin integrity, edema, etc.): VSS on RA      Pertinent Vitals/Pain Pain Assessment Pain Assessment: Faces Faces Pain Scale: Hurts little more Pain Location: L hip Pain Descriptors / Indicators: Discomfort, Aching, Burning Pain Intervention(s): Monitored during session, Limited activity within patient's tolerance, Repositioned    Home Living                          Prior Function            PT Goals (current goals can now be found in the care plan section) Acute Rehab PT Goals Patient Stated Goal: Return Home Progress towards PT goals: Progressing toward goals    Frequency    7X/week      PT Plan      Co-evaluation              AM-PAC PT 6 Clicks Mobility   Outcome Measure  Help needed turning from your back to your side while in a flat bed without using bedrails?: A Little Help needed moving from lying on your back to sitting on the side of a flat bed without using bedrails?: A Little Help needed moving to and from a bed to a chair (including a wheelchair)?: A Little Help needed standing up from a chair using your arms (e.g., wheelchair or bedside chair)?: A Little Help needed to walk in hospital room?: A Little Help needed climbing 3-5 steps with a railing? : A Little 6 Click Score: 18    End of Session Equipment Utilized During Treatment: Gait belt Activity Tolerance: Patient tolerated treatment well Patient left: in chair;with call bell/phone within reach Nurse Communication: Mobility status PT Visit Diagnosis: Difficulty in walking, not elsewhere classified (R26.2);Other abnormalities of gait and mobility (R26.89);Unsteadiness on feet (R26.81)     Time: 9067-9051 PT Time Calculation (min) (ACUTE ONLY): 16  min  Charges:    $Gait Training: 8-22 mins PT General Charges $$ ACUTE PT VISIT: 1 Visit                     Randall SAUNDERS, PT, DPT Acute Rehabilitation Services Office: [phone] Secure Chat Preferred  [name] [name] 07/23/2024, 10:21 AM

## 2024-07-26 ENCOUNTER — Ambulatory Visit
Admission: RE | Admit: 2024-07-26 | Discharge: 2024-07-26 | Disposition: A | Discharge status: Home / Self Care | Source: Ambulatory Visit | Attending: Family Medicine

## 2024-07-26 VITALS — BP 143/80 | HR 82 | Temp 98.1°F | Resp 17

## 2024-07-26 DIAGNOSIS — Z96642 Presence of left artificial hip joint: Secondary | ICD-10-CM | POA: Diagnosis not present

## 2024-07-26 DIAGNOSIS — Z4801 Encounter for change or removal of surgical wound dressing: Secondary | ICD-10-CM | POA: Diagnosis not present

## 2024-07-26 NOTE — Discharge Instructions (Addendum)
 We changed your dressing today.

## 2024-07-26 NOTE — ED Provider Notes (Signed)
 [facility]    CSN: [id number] Arrival date & time: 07/26/24  1120      History   Chief Complaint Chief Complaint  Patient presents with   Wound Check    Entered by patient    HPI [name] [name] is a 63 y.o. female.   [name] [name] is a 63 y.o. female presenting for chief complaint of wound check. She is status post left hip replacement performed by Dr. Vernetta on July 21, 2024 (5 days ago). She was discharged from the hospital on September 11th (3 days ago) and has been monitoring her left anterior hip dressing at home. Over the last 24 hours, she has noticed what appears to be potential purulent drainage underneath the Aquacel. She notes drainage has grown in the last 24 hours since noticing it.  Pain is well controlled at home with use of discharge pain medications. Denies new pain to the left hip associated with drainage noted on dressing. Denies fever, chills, nausea, vomiting, body aches, and erythema surrounding dressing/wound.  Dressing was last changed 3 days ago prior to hospital discharge. She has an extra Aquacel dressing today for us  to change it while she is here to inspect the wound further.   She is a type 2 diabetic and is not currently on any antibiotic therapy.      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   Thyroid  disease    no meds    Patient Active Problem List    Diagnosis Date Noted   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   Dermatochalasis 05/22/2023   Status post total replacement of right hip 02/03/2021   Positive colorectal cancer screening using Cologuard test 12/19/2020   Unilateral primary osteoarthritis, left knee 11/16/2020   Unilateral primary osteoarthritis, left hip 07/28/2020   Transaminitis    Erythrocytosis    Uncontrolled type 2 diabetes mellitus with hyperglycemia, with long-term current use of insulin  (HCC)    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   Schizoaffective disorder (HCC) 07/14/2020   Acute ischemic stroke (HCC) 07/13/2020   Dog bite of face 08/13/2018   Dog bite of ear 08/13/2018   Diabetes mellitus without complication (HCC)    Mood disorder (HCC) 11/11/2017   Type 2 diabetes mellitus without complication, without long-term current use of insulin  (HCC) 11/11/2017   Class 2 obesity with body mass index (BMI) of 37.0 to 37.9 in adult 11/11/2017   Mixed hyperlipidemia 11/11/2017   Tinea corporis 11/08/2017   Elevated liver enzymes 03/12/2015   HTN (hypertension) 03/11/2015   Hypothyroidism 12/14/2011    Past Surgical History:  Procedure Laterality Date   BREAST BIOPSY Right    needle bx  more than 15 yrs benign, no scar visible    BROW LIFT Bilateral 09/11/2023   Procedure: BLEPHAROPLASTY;  Surgeon: Lowery Estefana RAMAN, DO;  Location: [facility];  Service: Plastics;  Laterality: Bilateral;   LACERATION REPAIR N/A 12/13/2017   Procedure: Closure of nasal lacerations with one rotator graft and one advanced flap with skin graft, closure of left nasal laceration, closure of left ear.;  Surgeon: Roark Rush, MD;  Location: [facility];  Service: ENT;  Laterality: N/A;   TOTAL HIP ARTHROPLASTY Right 02/03/2021   Procedure: RIGHT TOTAL HIP ARTHROPLASTY ANTERIOR APPROACH;  Surgeon: Vernetta Lonni GRADE, MD;  Location: [facility];  Service: Orthopedics;  Laterality: Right;   TOTAL HIP ARTHROPLASTY Left  07/21/2024   Procedure: ARTHROPLASTY, LEFT HIP, TOTAL, ANTERIOR APPROACH;  Surgeon: Vernetta Lonni GRADE, MD;  Location: [facility];  Service: Orthopedics;  Laterality: Left;   upper blepharoplasty Bilateral 09/09/2023    OB History     Gravida  4   Para  1   Term  1   Preterm      AB  3   Living  1      SAB  3   IAB      Ectopic      Multiple      Live Births               Home Medications    Prior to Admission medications   Medication Sig Start Date End Date Taking? Authorizing Provider  aspirin  81 MG chewable tablet Chew 1 tablet (81 mg total) by mouth 2 (two) times daily. 07/23/24   Vernetta Lonni GRADE, MD  cholecalciferol  (VITAMIN D3) 25 MCG (1000 UNIT) tablet Take 1,000 Units by mouth daily.    [provider]  fluticasone (FLONASE) 50 MCG/ACT nasal spray Place into both nostrils daily.    [provider]  fluticasone-salmeterol (ADVAIR) 250-50 MCG/ACT AEPB Inhale 1 puff into the lungs in the morning and at bedtime. 02/26/24   [provider]  losartan  (COZAAR ) 100 MG tablet Take 50 mg by mouth daily.    [provider]  NON FORMULARY Pt uses a cpap nightly    [provider]  ONETOUCH ULTRA test strip  02/06/24   [provider]  OVER THE COUNTER MEDICATION Take 25-35 mg by mouth at bedtime. THC nightly chewable tablet    [provider]  oxyCODONE  (OXY IR/ROXICODONE ) 5 MG immediate release tablet Take 1-2 tablets (5-10 mg total) by mouth every 6 (six) hours as needed for moderate pain (pain score 4-6) (pain score 4-6). No more than 6 tablets per day. 07/23/24   Vernetta Lonni GRADE, MD    Family History Family History  Problem Relation Age of Onset   Alcohol abuse Mother    Arthritis Mother    Diabetes Mother    Hypertension Mother    Hyperlipidemia Mother    Colon polyps Mother    Other Mother        blood clotting disorder   Alcohol abuse Father    Arthritis Father    Asthma Father     Heart disease Father    Hypertension Father    Hyperlipidemia Father    Alcohol abuse Brother    Arthritis Brother    Drug abuse Brother    Colon cancer Neg Hx    Esophageal cancer Neg Hx    Rectal cancer Neg Hx    Stomach cancer Neg Hx     Social History  Social History   Tobacco Use   Smoking status: Former    Current packs/day: 0.00    Types: Cigarettes    Quit date: 2004    Years since quitting: 21.7   Smokeless tobacco: Never  Vaping Use   Vaping status: Never Used  Substance Use Topics   Alcohol use: Yes    Alcohol/week: 3.0 standard drinks of alcohol    Types: 1 Glasses of wine, 1 Cans of beer, 1 Shots of liquor per week    Comment: rpts very small amount socially   Drug use: No    Comment: THC edible 25 to 50 mg QHS     Allergies   Dairy aid [tilactase], Metformin  and related, and Citric acid   Review of Systems Review of Systems Per HPI  Physical Exam Triage Vital Signs ED Triage Vitals  Encounter Vitals Group     BP 07/26/24 1125 (!) 143/80     Girls Systolic BP Percentile --      Girls Diastolic BP Percentile --      Boys Systolic BP Percentile --      Boys Diastolic BP Percentile --      Pulse Rate 07/26/24 1125 82     Resp 07/26/24 1125 17     Temp 07/26/24 1125 98.1 F (36.7 C)     Temp Source 07/26/24 1125 Oral     SpO2 07/26/24 1125 95 %     Weight --      Height --      Head Circumference --      Peak Flow --      Pain Score 07/26/24 1129 0     Pain Loc --      Pain Education --      Exclude from Growth Chart --    No data found.  Updated Vital Signs BP (!) 143/80 (BP Location: Right Arm)   Pulse 82   Temp 98.1 F (36.7 C) (Oral)   Resp 17   SpO2 95%   Visual Acuity Right Eye Distance:   Left Eye Distance:   Bilateral Distance:    Right Eye Near:   Left Eye Near:    Bilateral Near:     Physical Exam Vitals and nursing note reviewed.  Constitutional:      Appearance: She is not ill-appearing or toxic-appearing.   HENT:     Head: Normocephalic and atraumatic.     Right Ear: Hearing and external ear normal.     Left Ear: Hearing and external ear normal.     Nose: Nose normal.     Mouth/Throat:     Lips: Pink.  Eyes:     General: Lids are normal. Vision grossly intact. Gaze aligned appropriately.     Extraocular Movements: Extraocular movements intact.     Conjunctiva/sclera: Conjunctivae normal.  Pulmonary:     Effort: Pulmonary effort is normal.  Musculoskeletal:     Cervical back: Neck supple.  Skin:    General: Skin is warm and dry.     Capillary Refill: Capillary refill takes less than 2 seconds.     Findings: Wound (Aquacel dressing present to the left hip removed to reveal healthy appearing and well approximated/non-draining surgical wound. No surrounding erythema or underlying induration. See images below.) present. No rash.  Neurological:     General: No focal deficit present.     Mental Status: She is alert and oriented to person, place, and time. Mental status is at baseline.  Cranial Nerves: No dysarthria or facial asymmetry.  Psychiatric:        Mood and Affect: Mood normal.        Speech: Speech normal.        Behavior: Behavior normal.        Thought Content: Thought content normal.        Judgment: Judgment normal.    Aquacel with drainage   Surgical wound to left anterior hip    UC Treatments / Results  Labs (all labs ordered are listed, but only abnormal results are displayed) Labs Reviewed - No data to display  EKG   Radiology No results found.  Procedures Procedures (including critical care time)  Medications Ordered in UC Medications - No data to display  Initial Impression / Assessment and Plan / UC Course  I have reviewed the triage vital signs and the nursing notes.  Pertinent labs & imaging results that were available during my care of the patient were reviewed by me and considered in my medical decision making (see chart for details).    1. Dressing change or removal surgical wound, status post left hip replacement Surgical wound looks great, no signs of infection.  Drainage on Aquacel likely serosanguinous and appears purulent from outside of Aquacel due to coloration of dressing.  Wound is well approximated, non-tender, and without erythema, active drainage, or swelling.   Dressing was changed using a new Aquacel dressing patient brought with her to urgent care.   Advised to continue wound care as discussed by surgeon and call surgeon's nurse line if she sees further weeping/drainage from wound for further instructions.   No signs of infection today, therefore no clinical indication for antibiotics.   She may return to urgent care as needed for further concerns.   Strict ER precautions discussed.  Counseled patient on potential for adverse effects with medications prescribed/recommended today, strict ER and return-to-clinic precautions discussed, patient verbalized understanding.    Final Clinical Impressions(s) / UC Diagnoses   Final diagnoses:  Dressing change or removal, surgical wound  Status post hip replacement, left     Discharge Instructions      We changed your dressing today.    ED Prescriptions   None    PDMP not reviewed this encounter.   Enedelia Dorna HERO, [place] 08/01/24 2108

## 2024-07-26 NOTE — ED Triage Notes (Addendum)
 Pt had left hip replacement on 9/9. Presents today due to left leg swelling, warmth, and badge looking gross for 1 day.  Denies any pain.   Pt did change Aquacel dressing.

## 2024-07-31 ENCOUNTER — Telehealth: Payer: Self-pay | Admitting: Orthopaedic Surgery

## 2024-07-31 ENCOUNTER — Other Ambulatory Visit: Payer: Self-pay | Admitting: Orthopaedic Surgery

## 2024-07-31 MED ORDER — OXYCODONE HCL 5 MG PO TABS: 5.0000 mg | ORAL_TABLET | Freq: Four times a day (QID) | ORAL | 0 refills | Status: AC | PRN

## 2024-07-31 NOTE — Telephone Encounter (Signed)
 Patient called and said she needs a refill on her Oxycodone . CB#[phone]

## 2024-07-31 NOTE — Telephone Encounter (Signed)
 Patient called and said that she has home therapy and she only had one visit. She has been trying to call and contact them for 3 days to see why no one has been coming out to her and she needs therapy. She wants you to call her. CB#[phone]

## 2024-07-31 NOTE — Telephone Encounter (Signed)
 Spoke with a patient and she is aware I have contacted [employer]

## 2024-08-03 ENCOUNTER — Ambulatory Visit (INDEPENDENT_AMBULATORY_CARE_PROVIDER_SITE_OTHER): Admitting: Orthopaedic Surgery

## 2024-08-03 DIAGNOSIS — Z96642 Presence of left artificial hip joint: Secondary | ICD-10-CM

## 2024-08-03 NOTE — Progress Notes (Signed)
 The patient is here for first postoperative visit status post a left total hip arthroplasty.  She does have a history of a right hip replaced as well.  She is doing well overall and ambulating with a cane.  She has been exercising on a regular basis but mainly just walking and pumping her feet.  She been compliant with a baby aspirin  twice daily.  On exam her left hip incision looks good.  Staples been removed and Steri-Strips applied.  There is only a mild seroma and I did aspirate about 30 to 40 cc of fluid off of the soft tissue.  Her calf is soft.  She will work on still pumping her feet and her mobility overall.  She can stop her baby aspirin  twice daily.  She can drive from our standpoint.  She is not taking narcotics.  Will see her back in a month to see how she is doing overall but no x-rays are needed.  If there are issues before then she knows to reach out to us .

## 2024-08-05 ENCOUNTER — Encounter: Payer: Self-pay | Admitting: Orthopaedic Surgery

## 2024-08-11 ENCOUNTER — Other Ambulatory Visit (HOSPITAL_COMMUNITY): Payer: Self-pay | Admitting: Family Medicine

## 2024-08-11 ENCOUNTER — Ambulatory Visit (HOSPITAL_COMMUNITY)
Admission: RE | Admit: 2024-08-11 | Discharge: 2024-08-11 | Disposition: A | Discharge status: Home / Self Care | Source: Ambulatory Visit | Attending: Vascular Surgery | Admitting: Vascular Surgery

## 2024-08-11 DIAGNOSIS — M7989 Other specified soft tissue disorders: Secondary | ICD-10-CM

## 2024-08-31 ENCOUNTER — Ambulatory Visit
Admission: RE | Admit: 2024-08-31 | Discharge: 2024-08-31 | Disposition: A | Discharge status: Home / Self Care | Source: Ambulatory Visit | Attending: Family Medicine | Admitting: Family Medicine

## 2024-08-31 DIAGNOSIS — Z1231 Encounter for screening mammogram for malignant neoplasm of breast: Secondary | ICD-10-CM

## 2024-09-02 ENCOUNTER — Ambulatory Visit: Admitting: Orthopaedic Surgery

## 2024-09-02 ENCOUNTER — Encounter: Payer: Self-pay | Admitting: Orthopaedic Surgery

## 2024-09-02 DIAGNOSIS — Z96642 Presence of left artificial hip joint: Secondary | ICD-10-CM

## 2024-09-02 NOTE — Progress Notes (Signed)
 The patient is well-known.  She is just under 2 months status post a left total hip replacement to treat significant left hip pain and arthritis.  We replaced her right hip back in 2022.  She is a very active 63 year old female.  She reports improving range of motion and strength but still cannot sleep in bed mainly with pain that left side.  She actually showed me a treadmill type of device and they have at home for walking and she is using her treadmill as railing and I think is a very intuitive and great on her behalf for making that work.  Her hips move smoothly and fluidly but she does walk with a slight limp.  Her pain is only mild with the exam.  She will continue increase her activities as comfort allows with no restrictions.  The next time we need to see her is not for 6 months unless there are issues.  Will have a standing AP pelvis at that visit.

## 2024-09-03 ENCOUNTER — Other Ambulatory Visit: Payer: Self-pay | Admitting: Family Medicine

## 2024-09-03 DIAGNOSIS — Z1231 Encounter for screening mammogram for malignant neoplasm of breast: Secondary | ICD-10-CM

## 2024-09-14 ENCOUNTER — Encounter: Payer: Self-pay | Admitting: Radiology

## 2024-10-12 ENCOUNTER — Encounter: Payer: Self-pay | Admitting: Pulmonary Disease

## 2025-03-03 ENCOUNTER — Ambulatory Visit: Admitting: Orthopaedic Surgery

## 2025-09-01 ENCOUNTER — Ambulatory Visit
